# Patient Record
Sex: Male | Born: 1950
Health system: Southern US, Community
[De-identification: ages and names within clinical notes are randomized; demographics above are authoritative.]

## PROBLEM LIST (undated history)

## (undated) DIAGNOSIS — T7840XA Allergy, unspecified, initial encounter: Secondary | ICD-10-CM

## (undated) DIAGNOSIS — J449 Chronic obstructive pulmonary disease, unspecified: Secondary | ICD-10-CM

## (undated) DIAGNOSIS — I10 Essential (primary) hypertension: Secondary | ICD-10-CM

## (undated) DIAGNOSIS — H269 Unspecified cataract: Secondary | ICD-10-CM

## (undated) DIAGNOSIS — M1990 Unspecified osteoarthritis, unspecified site: Secondary | ICD-10-CM

## (undated) DIAGNOSIS — A419 Sepsis, unspecified organism: Secondary | ICD-10-CM

## (undated) DIAGNOSIS — K56609 Unspecified intestinal obstruction, unspecified as to partial versus complete obstruction: Secondary | ICD-10-CM

## (undated) DIAGNOSIS — K219 Gastro-esophageal reflux disease without esophagitis: Secondary | ICD-10-CM

## (undated) DIAGNOSIS — G8929 Other chronic pain: Secondary | ICD-10-CM

## (undated) DIAGNOSIS — M549 Dorsalgia, unspecified: Secondary | ICD-10-CM

## (undated) DIAGNOSIS — Z86010 Personal history of colonic polyps: Principal | ICD-10-CM

## (undated) DIAGNOSIS — N400 Enlarged prostate without lower urinary tract symptoms: Secondary | ICD-10-CM

## (undated) DIAGNOSIS — J45909 Unspecified asthma, uncomplicated: Secondary | ICD-10-CM

## (undated) HISTORY — PX: INSERTION DISTRACTION DEVICE LUMBAR POSTERIOR SPINOUS PROCESS: SUR726

## (undated) HISTORY — PX: APPENDECTOMY: SHX54

## (undated) HISTORY — PX: SMALL BOWEL REPAIR: SHX6447

## (undated) HISTORY — PX: BACK SURGERY: SHX140

## (undated) HISTORY — DX: Unspecified osteoarthritis, unspecified site: M19.90

## (undated) HISTORY — DX: Allergy, unspecified, initial encounter: T78.40XA

## (undated) HISTORY — PX: ANTERIOR CERVICAL DECOMP/DISCECTOMY FUSION: SHX1161

## (undated) HISTORY — PX: COLONOSCOPY: SHX174

## (undated) HISTORY — DX: Unspecified asthma, uncomplicated: J45.909

## (undated) HISTORY — DX: Sepsis, unspecified organism: A41.9

## (undated) HISTORY — DX: Personal history of colonic polyps: Z86.010

## (undated) HISTORY — PX: HERNIA REPAIR: SHX51

## (undated) HISTORY — PX: KNEE ARTHROSCOPY: SHX127

## (undated) HISTORY — DX: Unspecified intestinal obstruction, unspecified as to partial versus complete obstruction: K56.609

## (undated) HISTORY — DX: Unspecified cataract: H26.9

## (undated) HISTORY — PX: ABDOMINAL HERNIA REPAIR: SHX539

---

## 2000-11-19 ENCOUNTER — Encounter: Payer: Self-pay | Admitting: Neurological Surgery

## 2000-11-19 ENCOUNTER — Inpatient Hospital Stay (HOSPITAL_COMMUNITY): Admission: RE | Admit: 2000-11-19 | Discharge: 2000-11-20 | Payer: Self-pay | Admitting: Neurological Surgery

## 2000-12-16 ENCOUNTER — Encounter: Admission: RE | Admit: 2000-12-16 | Discharge: 2000-12-16 | Payer: Self-pay | Admitting: Neurological Surgery

## 2000-12-16 ENCOUNTER — Encounter: Payer: Self-pay | Admitting: Neurological Surgery

## 2001-02-03 ENCOUNTER — Encounter: Admission: RE | Admit: 2001-02-03 | Discharge: 2001-03-01 | Payer: Self-pay | Admitting: Family Medicine

## 2001-08-23 ENCOUNTER — Encounter: Payer: Self-pay | Admitting: Emergency Medicine

## 2001-08-23 ENCOUNTER — Emergency Department (HOSPITAL_COMMUNITY): Admission: EM | Admit: 2001-08-23 | Discharge: 2001-08-23 | Payer: Self-pay | Admitting: Emergency Medicine

## 2001-11-03 ENCOUNTER — Encounter: Payer: Self-pay | Admitting: Family Medicine

## 2001-11-03 ENCOUNTER — Encounter: Admission: RE | Admit: 2001-11-03 | Discharge: 2001-11-03 | Payer: Self-pay | Admitting: Family Medicine

## 2001-11-04 ENCOUNTER — Encounter: Payer: Self-pay | Admitting: Family Medicine

## 2001-11-04 ENCOUNTER — Ambulatory Visit (HOSPITAL_COMMUNITY): Admission: RE | Admit: 2001-11-04 | Discharge: 2001-11-04 | Payer: Self-pay | Admitting: Family Medicine

## 2001-12-08 ENCOUNTER — Inpatient Hospital Stay (HOSPITAL_COMMUNITY): Admission: EM | Admit: 2001-12-08 | Discharge: 2001-12-15 | Payer: Self-pay | Admitting: Emergency Medicine

## 2001-12-08 ENCOUNTER — Encounter: Payer: Self-pay | Admitting: Emergency Medicine

## 2001-12-09 ENCOUNTER — Encounter: Payer: Self-pay | Admitting: Surgery

## 2004-12-01 ENCOUNTER — Observation Stay (HOSPITAL_COMMUNITY): Admission: EM | Admit: 2004-12-01 | Discharge: 2004-12-02 | Payer: Self-pay

## 2005-09-17 ENCOUNTER — Encounter: Admission: RE | Admit: 2005-09-17 | Discharge: 2005-09-17 | Payer: Self-pay | Admitting: Family Medicine

## 2005-09-17 ENCOUNTER — Inpatient Hospital Stay (HOSPITAL_COMMUNITY): Admission: AD | Admit: 2005-09-17 | Discharge: 2005-09-20 | Payer: Self-pay | Admitting: Internal Medicine

## 2005-11-15 ENCOUNTER — Encounter: Admission: RE | Admit: 2005-11-15 | Discharge: 2005-11-15 | Payer: Self-pay | Admitting: Family Medicine

## 2005-12-24 ENCOUNTER — Ambulatory Visit: Payer: Self-pay | Admitting: Gastroenterology

## 2006-01-29 ENCOUNTER — Ambulatory Visit: Payer: Self-pay | Admitting: Gastroenterology

## 2006-02-05 ENCOUNTER — Ambulatory Visit: Payer: Self-pay | Admitting: Gastroenterology

## 2006-04-22 ENCOUNTER — Encounter: Admission: RE | Admit: 2006-04-22 | Discharge: 2006-04-22 | Payer: Self-pay | Admitting: Orthopedic Surgery

## 2006-05-05 ENCOUNTER — Ambulatory Visit (HOSPITAL_COMMUNITY): Admission: RE | Admit: 2006-05-05 | Discharge: 2006-05-05 | Payer: Self-pay | Admitting: Orthopedic Surgery

## 2006-06-02 ENCOUNTER — Ambulatory Visit: Payer: Self-pay | Admitting: Internal Medicine

## 2006-06-18 ENCOUNTER — Ambulatory Visit: Payer: Self-pay

## 2006-07-07 ENCOUNTER — Ambulatory Visit: Payer: Self-pay | Admitting: Internal Medicine

## 2006-07-10 ENCOUNTER — Ambulatory Visit: Payer: Self-pay | Admitting: Internal Medicine

## 2006-07-30 ENCOUNTER — Encounter: Admission: RE | Admit: 2006-07-30 | Discharge: 2006-07-30 | Payer: Self-pay | Admitting: Family Medicine

## 2006-08-31 ENCOUNTER — Ambulatory Visit: Payer: Self-pay | Admitting: Internal Medicine

## 2006-11-28 ENCOUNTER — Emergency Department (HOSPITAL_COMMUNITY): Admission: EM | Admit: 2006-11-28 | Discharge: 2006-11-28 | Payer: Self-pay | Admitting: Emergency Medicine

## 2006-12-04 ENCOUNTER — Ambulatory Visit: Payer: Self-pay | Admitting: Internal Medicine

## 2007-02-09 ENCOUNTER — Encounter: Admission: RE | Admit: 2007-02-09 | Discharge: 2007-02-09 | Payer: Self-pay | Admitting: Family Medicine

## 2007-02-16 ENCOUNTER — Ambulatory Visit (HOSPITAL_COMMUNITY): Admission: RE | Admit: 2007-02-16 | Discharge: 2007-02-16 | Payer: Self-pay | Admitting: Family Medicine

## 2007-08-10 ENCOUNTER — Encounter: Admission: RE | Admit: 2007-08-10 | Discharge: 2007-08-10 | Payer: Self-pay | Admitting: Family Medicine

## 2007-08-16 ENCOUNTER — Ambulatory Visit: Payer: Self-pay | Admitting: Internal Medicine

## 2007-12-07 ENCOUNTER — Encounter: Admission: RE | Admit: 2007-12-07 | Discharge: 2007-12-07 | Payer: Self-pay | Admitting: Orthopedic Surgery

## 2008-03-29 ENCOUNTER — Ambulatory Visit: Payer: Self-pay | Admitting: Internal Medicine

## 2008-03-29 LAB — CONVERTED CEMR LAB
AST: 30 units/L (ref 0–37)
Albumin: 4 g/dL (ref 3.5–5.2)
Alkaline Phosphatase: 76 units/L (ref 39–117)
Cholesterol: 111 mg/dL (ref 0–200)
Total CHOL/HDL Ratio: 4.8
Triglycerides: 62 mg/dL (ref 0–149)

## 2008-04-14 ENCOUNTER — Ambulatory Visit: Payer: Self-pay | Admitting: Internal Medicine

## 2008-04-25 ENCOUNTER — Ambulatory Visit: Payer: Self-pay

## 2008-04-25 LAB — CONVERTED CEMR LAB
CO2: 32 meq/L (ref 19–32)
Calcium: 9 mg/dL (ref 8.4–10.5)
Creatinine, Ser: 0.9 mg/dL (ref 0.4–1.5)
Glucose, Bld: 112 mg/dL — ABNORMAL HIGH (ref 70–99)

## 2008-04-29 ENCOUNTER — Emergency Department (HOSPITAL_COMMUNITY): Admission: EM | Admit: 2008-04-29 | Discharge: 2008-04-29 | Payer: Self-pay | Admitting: Emergency Medicine

## 2008-05-05 ENCOUNTER — Encounter: Admission: RE | Admit: 2008-05-05 | Discharge: 2008-05-05 | Payer: Self-pay | Admitting: Internal Medicine

## 2008-06-27 ENCOUNTER — Encounter: Admission: RE | Admit: 2008-06-27 | Discharge: 2008-06-27 | Payer: Self-pay | Admitting: Orthopedic Surgery

## 2008-12-22 ENCOUNTER — Ambulatory Visit: Payer: Self-pay | Admitting: Internal Medicine

## 2009-01-04 ENCOUNTER — Encounter: Payer: Self-pay | Admitting: Internal Medicine

## 2009-01-04 ENCOUNTER — Ambulatory Visit: Payer: Self-pay

## 2009-08-25 ENCOUNTER — Inpatient Hospital Stay (HOSPITAL_COMMUNITY): Admission: EM | Admit: 2009-08-25 | Discharge: 2009-08-28 | Payer: Self-pay | Admitting: Emergency Medicine

## 2009-10-17 ENCOUNTER — Encounter (INDEPENDENT_AMBULATORY_CARE_PROVIDER_SITE_OTHER): Payer: Self-pay | Admitting: *Deleted

## 2010-03-14 ENCOUNTER — Telehealth (INDEPENDENT_AMBULATORY_CARE_PROVIDER_SITE_OTHER): Payer: Self-pay | Admitting: *Deleted

## 2010-11-20 ENCOUNTER — Encounter: Payer: Self-pay | Admitting: Internal Medicine

## 2010-12-09 ENCOUNTER — Encounter: Payer: Self-pay | Admitting: Internal Medicine

## 2010-12-09 ENCOUNTER — Ambulatory Visit
Admission: RE | Admit: 2010-12-09 | Discharge: 2010-12-09 | Payer: Self-pay | Source: Home / Self Care | Attending: Internal Medicine | Admitting: Internal Medicine

## 2010-12-09 DIAGNOSIS — R079 Chest pain, unspecified: Secondary | ICD-10-CM | POA: Insufficient documentation

## 2010-12-14 ENCOUNTER — Inpatient Hospital Stay (HOSPITAL_COMMUNITY): Admission: EM | Admit: 2010-12-14 | Discharge: 2010-12-25 | Payer: Self-pay | Source: Home / Self Care

## 2010-12-16 LAB — BASIC METABOLIC PANEL
BUN: 32 mg/dL — ABNORMAL HIGH (ref 6–23)
BUN: 26 mg/dL — ABNORMAL HIGH (ref 6–23)
CO2: 26 mEq/L (ref 19–32)
CO2: 24 mEq/L (ref 19–32)
Calcium: 8 mg/dL — ABNORMAL LOW (ref 8.4–10.5)
Calcium: 8 mg/dL — ABNORMAL LOW (ref 8.4–10.5)
Chloride: 101 mEq/L (ref 96–112)
Chloride: 108 mEq/L (ref 96–112)
Creatinine, Ser: 1.74 mg/dL — ABNORMAL HIGH (ref 0.4–1.5)
Creatinine, Ser: 1.41 mg/dL (ref 0.4–1.5)
GFR calc Af Amer: 49 mL/min — ABNORMAL LOW (ref >60)
GFR calc Af Amer: >60 mL/min (ref >60)
GFR calc non Af Amer: 40 mL/min — ABNORMAL LOW (ref >60)
GFR calc non Af Amer: 51 mL/min — ABNORMAL LOW (ref >60)
Glucose, Bld: 109 mg/dL — ABNORMAL HIGH (ref 70–99)
Glucose, Bld: 101 mg/dL — ABNORMAL HIGH (ref 70–99)
Potassium: 4.4 mEq/L (ref 3.5–5.1)
Potassium: 4.6 mEq/L (ref 3.5–5.1)
Sodium: 134 mEq/L — ABNORMAL LOW (ref 135–145)
Sodium: 136 mEq/L (ref 135–145)

## 2010-12-16 LAB — COMPREHENSIVE METABOLIC PANEL
ALT: 27 U/L (ref 0–53)
AST: 26 U/L (ref 0–37)
Albumin: 3.5 g/dL (ref 3.5–5.2)
Alkaline Phosphatase: 61 U/L (ref 39–117)
BUN: 38 mg/dL — ABNORMAL HIGH (ref 6–23)
CO2: 22 mEq/L (ref 19–32)
Calcium: 8.1 mg/dL — ABNORMAL LOW (ref 8.4–10.5)
Chloride: 102 mEq/L (ref 96–112)
Creatinine, Ser: 2.18 mg/dL — ABNORMAL HIGH (ref 0.4–1.5)
GFR calc Af Amer: 38 mL/min — ABNORMAL LOW (ref >60)
GFR calc non Af Amer: 31 mL/min — ABNORMAL LOW (ref >60)
Glucose, Bld: 145 mg/dL — ABNORMAL HIGH (ref 70–99)
Potassium: 4.1 mEq/L (ref 3.5–5.1)
Sodium: 135 mEq/L (ref 135–145)
Total Bilirubin: 0.7 mg/dL (ref 0.3–1.2)
Total Protein: 7.1 g/dL (ref 6.0–8.3)

## 2010-12-16 LAB — DIFFERENTIAL
Basophils Absolute: 0 10*3/uL (ref 0.0–0.1)
Basophils Relative: 0 % (ref 0–1)
Eosinophils Absolute: 0 10*3/uL (ref 0.0–0.7)
Eosinophils Relative: 0 % (ref 0–5)
Lymphocytes Relative: 9 % — ABNORMAL LOW (ref 12–46)
Lymphs Abs: 1.1 10*3/uL (ref 0.7–4.0)
Monocytes Absolute: 1.1 10*3/uL — ABNORMAL HIGH (ref 0.1–1.0)
Monocytes Relative: 9 % (ref 3–12)
Neutro Abs: 10.3 10*3/uL — ABNORMAL HIGH (ref 1.7–7.7)
Neutrophils Relative %: 82 % — ABNORMAL HIGH (ref 43–77)

## 2010-12-16 LAB — URINALYSIS, ROUTINE W REFLEX MICROSCOPIC
Bilirubin Urine: NEGATIVE
Hgb urine dipstick: NEGATIVE
Ketones, ur: NEGATIVE mg/dL
Nitrite: NEGATIVE
Protein, ur: NEGATIVE mg/dL
Specific Gravity, Urine: 1.025 (ref 1.005–1.030)
Urine Glucose, Fasting: NEGATIVE mg/dL
Urobilinogen, UA: 1 mg/dL (ref 0.0–1.0)
pH: 5.5 (ref 5.0–8.0)

## 2010-12-16 LAB — CBC
HCT: 39.8 % (ref 39.0–52.0)
HCT: 39.3 % (ref 39.0–52.0)
HCT: 37.7 % — ABNORMAL LOW (ref 39.0–52.0)
Hemoglobin: 13.7 g/dL (ref 13.0–17.0)
Hemoglobin: 13.4 g/dL (ref 13.0–17.0)
Hemoglobin: 12.5 g/dL — ABNORMAL LOW (ref 13.0–17.0)
MCH: 31.9 pg (ref 26.0–34.0)
MCH: 32.1 pg (ref 26.0–34.0)
MCH: 31.6 pg (ref 26.0–34.0)
MCHC: 34.4 g/dL (ref 30.0–36.0)
MCHC: 34.1 g/dL (ref 30.0–36.0)
MCHC: 33.2 g/dL (ref 30.0–36.0)
MCV: 92.8 fL (ref 78.0–100.0)
MCV: 94.2 fL (ref 78.0–100.0)
MCV: 95.4 fL (ref 78.0–100.0)
Platelets: 255 10*3/uL (ref 150–400)
Platelets: 237 10*3/uL (ref 150–400)
Platelets: 250 10*3/uL (ref 150–400)
RBC: 4.29 MIL/uL (ref 4.22–5.81)
RBC: 4.17 MIL/uL — ABNORMAL LOW (ref 4.22–5.81)
RBC: 3.95 MIL/uL — ABNORMAL LOW (ref 4.22–5.81)
RDW: 13.7 % (ref 11.5–15.5)
RDW: 14.1 % (ref 11.5–15.5)
RDW: 14.1 % (ref 11.5–15.5)
WBC: 12.5 10*3/uL — ABNORMAL HIGH (ref 4.0–10.5)
WBC: 13.1 10*3/uL — ABNORMAL HIGH (ref 4.0–10.5)
WBC: 12.3 10*3/uL — ABNORMAL HIGH (ref 4.0–10.5)

## 2010-12-16 LAB — CROSSMATCH
ABO/RH(D): O POS
Antibody Screen: NEGATIVE

## 2010-12-16 LAB — ABO/RH: ABO/RH(D): O POS

## 2010-12-16 LAB — LACTIC ACID, PLASMA: Lactic Acid, Venous: 1.4 mmol/L (ref 0.5–2.2)

## 2010-12-16 LAB — MRSA PCR SCREENING: MRSA by PCR: POSITIVE — AB

## 2010-12-18 LAB — CBC
HCT: 36.6 % — ABNORMAL LOW (ref 39.0–52.0)
Hemoglobin: 12 g/dL — ABNORMAL LOW (ref 13.0–17.0)
MCH: 31.8 pg (ref 26.0–34.0)
MCHC: 32.8 g/dL (ref 30.0–36.0)
MCV: 97.1 fL (ref 78.0–100.0)
Platelets: 253 10*3/uL (ref 150–400)
RBC: 3.77 MIL/uL — ABNORMAL LOW (ref 4.22–5.81)
RDW: 14 % (ref 11.5–15.5)
WBC: 11.4 10*3/uL — ABNORMAL HIGH (ref 4.0–10.5)

## 2010-12-18 LAB — BASIC METABOLIC PANEL
BUN: 23 mg/dL (ref 6–23)
BUN: 18 mg/dL (ref 6–23)
CO2: 25 mEq/L (ref 19–32)
CO2: 24 mEq/L (ref 19–32)
Calcium: 7.8 mg/dL — ABNORMAL LOW (ref 8.4–10.5)
Calcium: 8 mg/dL — ABNORMAL LOW (ref 8.4–10.5)
Chloride: 105 mEq/L (ref 96–112)
Chloride: 106 mEq/L (ref 96–112)
Creatinine, Ser: 1.36 mg/dL (ref 0.4–1.5)
Creatinine, Ser: 1.07 mg/dL (ref 0.4–1.5)
GFR calc Af Amer: >60 mL/min (ref >60)
GFR calc Af Amer: >60 mL/min (ref >60)
GFR calc non Af Amer: 54 mL/min — ABNORMAL LOW (ref >60)
GFR calc non Af Amer: >60 mL/min (ref >60)
Glucose, Bld: 109 mg/dL — ABNORMAL HIGH (ref 70–99)
Glucose, Bld: 80 mg/dL (ref 70–99)
Potassium: 5.3 mEq/L — ABNORMAL HIGH (ref 3.5–5.1)
Potassium: 4.3 mEq/L (ref 3.5–5.1)
Sodium: 136 mEq/L (ref 135–145)
Sodium: 137 mEq/L (ref 135–145)

## 2010-12-21 ENCOUNTER — Encounter: Payer: Self-pay | Admitting: Family Medicine

## 2010-12-22 ENCOUNTER — Encounter: Payer: Self-pay | Admitting: Internal Medicine

## 2010-12-24 ENCOUNTER — Ambulatory Visit: Admit: 2010-12-24 | Payer: Self-pay

## 2010-12-24 LAB — MAGNESIUM: Magnesium: 1.8 mg/dL (ref 1.5–2.5)

## 2010-12-24 LAB — CBC
HCT: 33.7 % — ABNORMAL LOW (ref 39.0–52.0)
Hemoglobin: 11.4 g/dL — ABNORMAL LOW (ref 13.0–17.0)
MCH: 30.9 pg (ref 26.0–34.0)
MCHC: 33.6 g/dL (ref 30.0–36.0)
Platelets: 295 10*3/uL (ref 150–400)
RDW: 13.3 % (ref 11.5–15.5)
WBC: 10.3 10*3/uL (ref 4.0–10.5)

## 2010-12-24 LAB — BASIC METABOLIC PANEL
CO2: 24 mEq/L (ref 19–32)
Calcium: 8.4 mg/dL (ref 8.4–10.5)
Creatinine, Ser: 1.03 mg/dL (ref 0.4–1.5)
GFR calc Af Amer: >60 mL/min (ref >60)
GFR calc Af Amer: >60 mL/min (ref >60)
GFR calc non Af Amer: >60 mL/min (ref >60)
GFR calc non Af Amer: >60 mL/min (ref >60)
Glucose, Bld: 85 mg/dL (ref 70–99)
Potassium: 3.5 mEq/L (ref 3.5–5.1)
Sodium: 144 mEq/L (ref 135–145)
Sodium: 140 mEq/L (ref 135–145)

## 2010-12-24 LAB — URINE CULTURE

## 2010-12-24 LAB — URINALYSIS, MICROSCOPIC ONLY
Ketones, ur: NEGATIVE mg/dL
Leukocytes, UA: NEGATIVE
Nitrite: NEGATIVE
Protein, ur: NEGATIVE mg/dL
Urobilinogen, UA: 2 mg/dL — ABNORMAL HIGH (ref 0.0–1.0)

## 2010-12-24 NOTE — Discharge Summary (Signed)
Shane Shane Clark, Shane [name]:  [id number]  MEDICAL RECORD NO.:  [id number]          PATIENT TYPE:  INP  LOCATION:  5126                         FACILITY:  [facility]  PHYSICIAN:  Shane Shane Clark, MDDATE OF BIRTH:  02/19/1951  DATE OF ADMISSION:  12/14/2010 DATE OF DISCHARGE:  12/24/2010                        DISCHARGE SUMMARY - REFERRING   ADMISSION DIAGNOSES: 1. Recurrent small bowel obstruction, last obstruction August 28, 2009 with umbilical hernia repair. 2. Dehydration, acute renal failure, decreased urine output with     creatinine of 2.18. 3. Hypotension. 4. History of hypertension. 5. Chronic obstructive pulmonary disease/asthma. 6. Mild leukocytosis.  DISCHARGE DIAGNOSES: 1. Small bowel obstruction secondary to adhesions. 2. Prolonged postoperative ileus. 3. Positive methicillin-resistant Staph aureus screening. 4. History of dyslipidemia. 5. History of hypertension. 6. History of cervical disk disease with prior surgery. 7. Chronic obstructive pulmonary disease/asthma. 8. Morbid obesity with BMI of 45.2.  Weight is 138.70 kg, 69 inches.  PROCEDURES: 1. Small bowel obstruction with exploratory laparotomy and lysis of     adhesions, December 15, 2010. 2. CT of abdomen and pelvis without contrast showing mild small bowel     obstruction without hernia or mass, question of adhesions,     bibasilar atelectasis, tiny umbilical hernia containing fat.  BRIEF HISTORY:  The patient is a 61 year old gentleman with an extensive medical history.  He is followed by Dr. Vianne Clark for his primary care.  He presented with abdominal pain, distention, nausea and vomiting.  The pain started on Thursday and became progressively worse. He went to his primary care physician on Friday with complaints of dysuria, burning, along with abdominal pain.  At that time, it was diagnosed with a   urinary tract infection and was placed on Septra DS.  He returned  home but his pain persisted throughout the night.  He developed nausea, vomiting, fever, minimal urine output, and shortness of breath.  His last bowel movement was on Thursday prior to admission.  He subsequently presented to the ER for evaluation and was admitted.  HOSPITAL COURSE:  The patient was admitted to the step-down unit for monitoring of his shortness of breath, decreased urine output and hypotension.  He had a NG tube placed for decompression and aggressive IV fluid hydration was initiated.  He was maintained on his Symbicort nebulizers but his blood pressure medicines were withheld.  He was monitored in the step-down unit and ultimately taken to the operating room on December 15, 2010 by Dr. Dwain Clark and Dr. Johna Shane Clark, where he underwent exploratory laparotomy and lysis of adhesions.  He was transferred back to the Step down Unit in stable condition.  His blood pressure improved with ongoing hydration.  He had a low-grade temperature but, overall, made good progress and was transferred to 5100 on the first postoperative day.  He was started on progressive ambulation on the floor.  He had a prolonged ileus with a fair amount of NG output for the first several days.  With hydration, his creatinine came down to normal to 1.07 by December 17, 2010.  His white count also improved.  He  developed bowel sounds by the third postoperative day with a little bit of flatus but he had fair amount of NG output, so his diet was not advanced.  Ultimately over the weekend on January 20 and January 21, he was started on clear liquids and with further mobilization, started having more flatus and bowel movements.  He was advanced to full liquids and then a soft diet on the eighth postoperative day.  His IV fluids were discontinued.  His blood pressure medicine was restarted at a lower dose.  By December 24, 2010, he was ambulating well, tolerating regular diet with good bowel movements.  His IV  was discontinued.  His abdominal wound had a little bit of drainage on the dressing and we removed the staples on what is now the ninth postoperative day.  There was a little bit of separation and serous drainage from the very top of the incision. The whole incision was cleaned.  Benzoin and Steri-Strips were applied. We are ambulating the patient in the hall and if he does well, we anticipate discharging him home later this afternoon.  DISCHARGE MEDICATIONS: 1. Vicodin 1-2 tablets every 6 hours as needed. 2. He is not to take more than 2000 mg of Tylenol per day. 3. His potassium was low and we started him on potassium chloride 20     mEq one daily by p.o. at lunch.  We gave him a 47-month supply with     instructions to contact Dr. Levora Shane Clark about followup. 4. He is going to resume his Micardis HCT 80/12.5.  For the first 10     days, he is going to take 1/2 tablet b.i.d.  He is going to check     his blood pressure and if the systolic blood pressure is less than     110, he is going to contact Dr. Levora Shane Clark for instructions.  If his     blood pressure remains good after 10 days, he can resume a full     tablet once a day. 5. Continue aspirin 81 mg daily. 6. Nitroglycerin sublingual p.r.n. for pain. 7. Protonix 40 mg daily. 8. Resume his Symbicort 80/4.5 two puffs t.i.d. p.r.n..  DISCHARGE ACTIVITIES:  Light to moderate.  He is to walk daily.  He is to clean his wounds with plain soap and water.  He may shower in 48 hours and remove Steri-Strips in 7-10 days if they do not come off with normal washing first.  He has a follow-up appointment with Dr. Dwain Clark on Wednesday, February 1 at 4:30 p.m..  DISCHARGE LABS:  Urinalysis normal, 0-2 white cells per high-powered field.  Magnesium is 1.8.  BMP:  His sodium is 140, potassium of 3.1, chloride of 107, CO2 23, glucose 135, BUN of 12, creatinine 1.03.  CBC shows a white count 11.7, hemoglobin is 11.6, hematocrit is 34.5, platelets  of 295,000.  Urine culture during this hospitalization showed 20,000 colonies of multiple species and was not treated.  CONDITION ON DISCHARGE:  Improved.     Shane Shane Clark, P.A.   ______________________________ Shane Shane Clark, Shane Shane Clark    WDJ/MEDQ  D:  12/24/2010  T:  12/24/2010  Job:  [id number]  cc:   Shane Shane Clark, M.D.  Electronically Signed by Sherrie George P.A. on 12/24/2010 01:15:58 PM Electronically Signed by Emelia Loron Hakeem Frazzini on 12/24/2010 03:05:03 PM

## 2010-12-27 LAB — WOUND CULTURE

## 2010-12-31 NOTE — Progress Notes (Signed)
  Request Recieved from [facility] sent th [place]  [facility]  March 14, 2010 8:34 AM

## 2011-01-02 NOTE — Assessment & Plan Note (Addendum)
Summary: f1y/ gd   Visit Type:  Follow-up  CC:  chest pain, sob, and left arm and shoulder pain.  History of Present Illness: IDENTIFICATION:  Shane Clark is a 60 year old gentleman with a history of no known coronary artery disease, normal catheterization in 1998, Myoview in 2007, and again in 2009, no ischemia.  Last seen in January 2010 Since seen he continues to have chest pian.   Pain is frequent, can be constent.  Recently in church had stabbing pains in chest.  Took NTG with relief.  Alos notes some L arm pain that is positional Continues to complain of SOB.  Might be a little better on Symbicort.   Current Medications (verified): 1)  Pantoprazole Sodium 40 Mg Tbec (Pantoprazole Sodium) .... Take One Daily 2)  Aspir-Low 81 Mg Tbec (Aspirin) .... Take One Daily 3)  Symbicort 80-4.5 Mcg/act Aero (Budesonide-Formoterol Fumarate) .... Two Puff Twice Daily 4)  Micardis Hct 80-25 Mg Tabs (Telmisartan-Hctz) .... Take One Daily 5)  Nitrostat 0.4 Mg Subl (Nitroglycerin) .... Take As Needed  Allergies (verified): 1)  ! Percocet  Past History:  Past medical, surgical, family and social histories (including risk factors) reviewed, and no changes noted (except as noted below).  Past Medical History: Chest pain. COPD HTN  Family History: Reviewed history from 12/06/2010 and no changes required.   FAMILY HISTORY:  Noncontributory  Social History: Reviewed history from 12/06/2010 and no changes required.  He does not smoke, drink or use drugs.  Review of Systems       All systmes reivewed.  NEg to the above problem except as noted above.  Vital Signs:  Patient profile:   60 year old male Height:      69 inches Weight:      301 pounds BMI:     44.61 Pulse rate:   88 / minute Pulse rhythm:   regular BP sitting:   120 / 86  (right arm)  Vitals Entered By: Jacquelin Hawking, CMA (December 09, 2010 12:34 PM)  Physical Exam  Additional Exam:  patient a 60 year old in  NAD HEENT:  Normocephalic, atraumatic. EOMI, PERRLA.  Neck: JVP is normal. No thyromegaly. No bruits.  Lungs: clear to auscultation. No rales no wheezes.  Heart: Regular rate and rhythm. Normal S1, S2. No S3.   No significant murmurs. PMI not displaced.  Abdomen:  Supple, nontender. Normal bowel sounds. No masses. No hepatomegaly.  Extremities:   Good distal pulses throughout. No lower extremity edema.  Musculoskeletal :moving all extremities.  Neuro:   alert and oriented x3.    EKG  Procedure date:  12/09/2010  Findings:      NSR.  Nonspecific ST changes.  Impression & Recommendations:  Problem # 1:  CHEST PAIN UNSPECIFIED (ICD-786.50) Patinet has had mult evals, none recent.  I would recommend a Lexiscan myoview to r/p ischemia.  If neg a trial of Norvasc.  Continue current regimen.  Problem # 2:  HYPERTENSION, BENIGN (ICD-401.1) Adequate control.  Other Orders: EKG w/ Interpretation (93000) Nuclear Stress Test (Nuc Stress Test)  Patient Instructions: 1)  Your physician has requested that you have a lexiscan myoview.  For further information please visit [email or web address]  Please follow instruction sheet, as given.  we will call you about results and follow up   Appended Document: f1y/ gd Patient cancelled nuc stress test scheduled for 12/24/2010.

## 2011-01-02 NOTE — Letter (Signed)
Summary: [facility]   Imported By: Marylou Mccoy 12/23/2010 13:37:32  _____________________________________________________________________  External Attachment:    Type:   Image     Comment:   External Document

## 2011-01-14 ENCOUNTER — Ambulatory Visit
Admission: RE | Admit: 2011-01-14 | Discharge: 2011-01-14 | Disposition: A | Discharge status: Home / Self Care | Payer: Commercial Managed Care - PPO | Source: Ambulatory Visit | Attending: General Surgery | Admitting: General Surgery

## 2011-01-14 ENCOUNTER — Other Ambulatory Visit: Payer: Self-pay | Admitting: General Surgery

## 2011-01-14 ENCOUNTER — Inpatient Hospital Stay (HOSPITAL_COMMUNITY)
Admission: AD | Admit: 2011-01-14 | Discharge: 2011-01-16 | DRG: 392 | Disposition: A | Discharge status: Home / Self Care | Payer: 59 | Source: Ambulatory Visit | Attending: Pulmonary Disease | Admitting: Pulmonary Disease

## 2011-01-14 ENCOUNTER — Inpatient Hospital Stay (HOSPITAL_COMMUNITY): Payer: 59

## 2011-01-14 DIAGNOSIS — I10 Essential (primary) hypertension: Secondary | ICD-10-CM | POA: Diagnosis present

## 2011-01-14 DIAGNOSIS — R1030 Lower abdominal pain, unspecified: Secondary | ICD-10-CM

## 2011-01-14 DIAGNOSIS — I959 Hypotension, unspecified: Secondary | ICD-10-CM | POA: Diagnosis present

## 2011-01-14 DIAGNOSIS — J4489 Other specified chronic obstructive pulmonary disease: Secondary | ICD-10-CM | POA: Diagnosis present

## 2011-01-14 DIAGNOSIS — R1031 Right lower quadrant pain: Principal | ICD-10-CM | POA: Diagnosis present

## 2011-01-14 DIAGNOSIS — N179 Acute kidney failure, unspecified: Secondary | ICD-10-CM | POA: Diagnosis present

## 2011-01-14 DIAGNOSIS — J9819 Other pulmonary collapse: Secondary | ICD-10-CM | POA: Diagnosis present

## 2011-01-14 DIAGNOSIS — R339 Retention of urine, unspecified: Secondary | ICD-10-CM | POA: Diagnosis present

## 2011-01-14 DIAGNOSIS — K219 Gastro-esophageal reflux disease without esophagitis: Secondary | ICD-10-CM | POA: Diagnosis present

## 2011-01-14 DIAGNOSIS — J449 Chronic obstructive pulmonary disease, unspecified: Secondary | ICD-10-CM | POA: Diagnosis present

## 2011-01-14 LAB — URINALYSIS, ROUTINE W REFLEX MICROSCOPIC
Hgb urine dipstick: NEGATIVE
Ketones, ur: NEGATIVE mg/dL
Protein, ur: NEGATIVE mg/dL
Urine Glucose, Fasting: NEGATIVE mg/dL

## 2011-01-14 MED ORDER — IOHEXOL 300 MG/ML  SOLN
125.0000 mL | Freq: Once | INTRAMUSCULAR | Status: AC | PRN
  Administered 2011-01-14: 125 mL via INTRAVENOUS

## 2011-01-15 ENCOUNTER — Inpatient Hospital Stay (HOSPITAL_COMMUNITY): Payer: 59

## 2011-01-15 LAB — CBC
HCT: 34.3 % — ABNORMAL LOW (ref 39.0–52.0)
Hemoglobin: 11.6 g/dL — ABNORMAL LOW (ref 13.0–17.0)
MCH: 31 pg (ref 26.0–34.0)
MCHC: 33.8 g/dL (ref 30.0–36.0)
MCV: 91.7 fL (ref 78.0–100.0)

## 2011-01-15 LAB — BASIC METABOLIC PANEL
CO2: 24 mEq/L (ref 19–32)
Calcium: 8.7 mg/dL (ref 8.4–10.5)
Creatinine, Ser: 1.26 mg/dL (ref 0.4–1.5)
Glucose, Bld: 128 mg/dL — ABNORMAL HIGH (ref 70–99)

## 2011-01-16 LAB — BASIC METABOLIC PANEL
CO2: 25 mEq/L (ref 19–32)
GFR calc Af Amer: >60 mL/min (ref >60)
GFR calc non Af Amer: >60 mL/min (ref >60)
Glucose, Bld: 133 mg/dL — ABNORMAL HIGH (ref 70–99)
Potassium: 5 mEq/L (ref 3.5–5.1)
Sodium: 136 mEq/L (ref 135–145)

## 2011-01-16 LAB — CBC
HCT: 37 % — ABNORMAL LOW (ref 39.0–52.0)
Hemoglobin: 12.4 g/dL — ABNORMAL LOW (ref 13.0–17.0)
RDW: 13 % (ref 11.5–15.5)
WBC: 12.4 10*3/uL — ABNORMAL HIGH (ref 4.0–10.5)

## 2011-01-16 LAB — URINE CULTURE

## 2011-01-20 NOTE — Discharge Summary (Signed)
  Shane Clark, Shane [name]:  [id number]  MEDICAL RECORD NO.:  [id number]          PATIENT TYPE:  INP  LOCATION:  5126                         FACILITY:  [facility]  PHYSICIAN:  Mary Sella. Andrey Campanile, MD     DATE OF BIRTH:  04/27/1951  DATE OF ADMISSION:  12/14/2010 DATE OF DISCHARGE:  12/25/2010                              DISCHARGE SUMMARY   ADDENDUM  BRIEF HISTORY:  The patient is a 60 year old gentleman who presented with small bowel obstruction and subsequently underwent exploratory laparotomy, lysis of adhesions on December 15, 2010.  He had developed postop ileus but has subsequently made good recovery.  His staples were removed on the ninth postoperative day, December 24, 2010.  He had some drainage from very top, initially it was open and looked good with Steri- Strips but we had him up and ambulating for a while and the top portion separated approximately 2 inches in length, about 0.5 inch in depth. This was cleaned, irrigated, and packed with sterile wet-to-dry 4x4s. He was kept in the hospital overnight.  Following a.m. on December 25, 2010, the wound is clean.  There is some fibrinous material at the base but the sites are clean.  We will plan to discharge him home on wet-to- dry dressings and having followup.  He has been instructed to call if he has a temperature, drainage or generally feels bad is not sure why. His wife is a Engineer, civil (consulting) here at the hospital, she will do the wet-to-dry dressings.  We are going to get an abdominal binder to cut down on the amount of tape he will need to hold the dressing in place.  He is scheduled to come back in 2 weeks to see Dr. Dwain Sarna.  We will have him come back sooner if he has any of the above-noted problems.  Discharge meds will be as listed on the previous discharge summary.  CONDITION ON DISCHARGE:  Stable.     Eber Hong, P.A.   ______________________________ Mary Sella. Andrey Campanile, MD    WDJ/MEDQ  D:   12/25/2010  T:  12/25/2010  Job:  [id number]  cc:   Bryan Lemma. Manus Gunning, M.D.  Electronically Signed by Sherrie George P.A. on 01/02/2011 02:21:19 PM Electronically Signed by Gaynelle Adu M.D. on 01/20/2011 08:10:45 AM

## 2011-02-10 NOTE — Discharge Summary (Signed)
NAME:  Shane Clark, SONNEBORN NO.:  [id number]  MEDICAL RECORD NO.:  [id number]           PATIENT TYPE:  I  LOCATION:  5152                         FACILITY:  [facility]  PHYSICIAN:  Ardeth Sportsman, MD     DATE OF BIRTH:  [birth year]  DATE OF ADMISSION:  01/14/2011 DATE OF DISCHARGE:  01/16/2011                              DISCHARGE SUMMARY   ADMITTING PHYSICIAN:  Ardeth Sportsman, MD  DISCHARGING PHYSICIAN:  Ardeth Sportsman, MD  PRIMARY SURGEON:  Troy Sine. Dwain Sarna, MD  PRIMARY CARE PHYSICIAN:  Bryan Lemma. Ehinger, MD  REASON FOR ADMISSION:  Mr. Vandyke is a very pleasant 60 year old white male who is known to our service as he recently underwent a laparotomy with lysis of adhesions for small bowel obstruction by Dr. Dwain Sarna. The patient has been doing well from this until this past Sunday.  He went to see Dr. Dwain Sarna in our office on Tuesday with right lower quadrant abdominal pain.  This started on Sunday.  He states he was lying in bed and was trying to get up and then developed this severe right lower quadrant pain.  He denied any nausea, vomiting, diarrhea. He is continuing to pass flatus as well as have bowel movements.  He has not been running any fevers.  However, he has been having a decrease in urine output.  He went to see Dr. Dwain Sarna who set him for a CT scan. He also send him for labs which revealed a white blood cell count of 16,000 as well as an elevated creatinine at 2.0.  His CT scan at this time was normal with no acute findings except for bibasilar atelectasis. He was then sent to his primary care's office for an evaluation for urinalysis.  This was essentially negative and at this time he was admitted for further observation by the surgical service.  Please see admitting history and physical for further details.  ADMITTING DIAGNOSES: 1. Abdominal pain status post recent laparotomy with lysis of     adhesions. 2. Acute renal failure with  urinary retention. 3. History of hypertension. 4. Currently hypotensive. 5. History of asthma, 6. Chronic obstructive pulmonary disease. 7. Reflux.  HOSPITAL COURSE:  At this time the patient was admitted.  A chest x-ray was obtained which was essentially negative except for some atelectasis. Because there was no definite source for his white blood cell count, he was not placed on antibiotics.  He was however placed on IV fluids at a rate of 125 mL an hour.  Upon arrival, his admitting blood pressure was noted to be 80/50.  Throughout the night, it continued to decrease into the systolic range of 70.  At this time the patient was transferred to a step-down unit for closer monitoring.  During that time the patient had voided 230 mL but after bladder scan revealed that he still had approximately 560 mL in his bladder.  Secondary to his elevated creatinine, a Foley catheter was in place.  This is very similar to his original presenting symptoms during his first admission with his bowel obstruction as he had a worse  creatinine at that time which eventually did resolve with the Foley and fluids as well.    In step-down, the patient's blood pressure eventually bounced back up to the  systolic range of 100.  His diet was started on clears and then advanced  as tolerated.  By this second hospital day, the patient's abdominal pain was beginning to improve and he was having to take less pain medicine. He was subsequently transferred back up to a regular floor.  On hospital day #3, the patient states that his pain was almost essentially gone.  A Foley catheter remained in place.  His abdomen was otherwise soft with minimal right lower to mid central tenderness.  However, this was much improved.  He did have active bowel sounds and was otherwise nondistended.  An urine culture revealed an insignificant amount of growth.  His white blood cell count had decreased on its own to 12,000.  At this  time the patient was in good spirits and was feeling well enough for discharge home.  I did contact Urology regarding his urinary retention.  A renal ultrasound was obtained during this admission which was completely normal.  I spoke with Dr. Brunilda Payor who has felt that this was not an urgent hospital consult.  He informed me to place the patient on Flomax and to leave the Foley in place as he would be happy to see  the patient next week for further evaluation.  DISCHARGE DIAGNOSES: 1. Abdominal pain, unknown etiology. 2. Status post laparotomy with lysis of adhesions 32 days ago. 3. Urinary retention status post placement of Foley catheter. 4. Hypotension improving. 5. History of hypertension. 6. Asthma.  DISCHARGE MEDICATIONS:  The patient will remain on all of his normal home medications except for I have informed him to hold his Micardis/HCT as well as his potassium 20 mEq daily as his blood pressure is still not high enough to be able to maintain a hypertensive medication.  I have informed him he will need to follow up with Dr. Manus Gunning to evaluate this and to get an idea of when he can restart this.  Otherwise, the patient is to continue all his home medications and a new prescription for Flomax 0.4 mg p.o. daily has been written for.  DISCHARGE INSTRUCTIONS:  The patient is to continue his normal restrictions for his postsurgical time frame.  He is to resume a low- sodium heart-healthy diet.  He is to continue with his normal abdominal dressing changes and he will have a Foley in place with a leg bag and he is to empty this as needed.  He is to follow up with Dr. Dwain Sarna on February 28 for his scheduled appointment.  I will write for the patient to get a CBC the Friday prior to this to follow his white blood cell count.  Otherwise, the patient will need to follow up with Dr. Manus Gunning as well as Dr. Brunilda Payor.  This appointment with Dr. Brunilda Payor has been set up for Monday February 20 at  10:30 a.m.  The patient is to call our office for worsening pain or fever greater than 101.5.     Letha Cape, PA   ______________________________ Ardeth Sportsman, MD    KEO/MEDQ  D:  01/16/2011  T:  01/17/2011  Job:  [id number]  cc:   Lindaann Slough, M.D. Juanetta Gosling, MD Bryan Lemma. Manus Gunning, M.D.  Electronically Signed by Barnetta Chapel PA on 01/24/2011 07:06:11 AM Electronically Signed by Karie Soda MD on 02/10/2011  11:58:39 AM

## 2011-03-07 LAB — BASIC METABOLIC PANEL
BUN: 15 mg/dL (ref 6–23)
BUN: 21 mg/dL (ref 6–23)
CO2: 29 mEq/L (ref 19–32)
Chloride: 101 mEq/L (ref 96–112)
Chloride: 102 mEq/L (ref 96–112)
Chloride: 97 mEq/L (ref 96–112)
Creatinine, Ser: 1.09 mg/dL (ref 0.4–1.5)
GFR calc Af Amer: >60 mL/min (ref >60)
Glucose, Bld: 124 mg/dL — ABNORMAL HIGH (ref 70–99)
Glucose, Bld: 142 mg/dL — ABNORMAL HIGH (ref 70–99)
Potassium: 3.5 mEq/L (ref 3.5–5.1)
Potassium: 5 mEq/L (ref 3.5–5.1)

## 2011-03-07 LAB — CBC
HCT: 42.2 % (ref 39.0–52.0)
HCT: 47.7 % (ref 39.0–52.0)
Hemoglobin: 14.7 g/dL (ref 13.0–17.0)
MCHC: 34.6 g/dL (ref 30.0–36.0)
MCV: 95.1 fL (ref 78.0–100.0)
MCV: 95.2 fL (ref 78.0–100.0)
MCV: 96.2 fL (ref 78.0–100.0)
Platelets: 226 10*3/uL (ref 150–400)
Platelets: 259 10*3/uL (ref 150–400)
RBC: 5.01 MIL/uL (ref 4.22–5.81)
RDW: 13.9 % (ref 11.5–15.5)
WBC: 11.9 10*3/uL — ABNORMAL HIGH (ref 4.0–10.5)
WBC: 13.8 10*3/uL — ABNORMAL HIGH (ref 4.0–10.5)
WBC: 19.4 10*3/uL — ABNORMAL HIGH (ref 4.0–10.5)

## 2011-03-07 LAB — DIFFERENTIAL
Eosinophils Absolute: 0 10*3/uL (ref 0.0–0.7)
Eosinophils Relative: 0 % (ref 0–5)
Lymphs Abs: 0.4 10*3/uL — ABNORMAL LOW (ref 0.7–4.0)
Monocytes Relative: 5 % (ref 3–12)

## 2011-03-07 LAB — LACTIC ACID, PLASMA: Lactic Acid, Venous: 3.1 mmol/L — ABNORMAL HIGH (ref 0.5–2.2)

## 2011-04-15 NOTE — Assessment & Plan Note (Signed)
[facility]                            CARDIOLOGY OFFICE NOTE   NAME:[name], [name]                       MRN:          [id number]  DATE:04/14/2008                            DOB:          Aug 27, 1951    IDENTIFICATION:  Shane Clark is a 60 year old gentleman who I follow in  cardiology clinic.  He has no known coronary artery disease.  He had  normal catheterization in 1998.  Myoview scan was done in 2007 that  showed no ischemia.   Since seen, the patient says he has just been more fatigued.  He gets  short of breath more easily.  He has episodes of left arm, left shoulder  pain not associated with any particular activity, he is not sure what  brings them on.  He notices it more at work.   Of note, a few weeks ago he was treated for an MRSA skin infection.  He  notes multiple lesions coming up on his thighs and stomach area.  He  still has a residual lesion on his left arm and left hand.  He has  finished antibiotics now for a few weeks.   I got in touch with him and he has been on Lipitor.  We actually have a  fasting lipid panel from April 29:  Total cholesterol was 111, LDL was  much improved at 76; however, HDL has gone down to 23.   CURRENT MEDICINES:  1. Aspirin 81.  2. Protonix 40.  3. Lipitor 20.  4. Hyzaar 100/25 daily.   PHYSICAL EXAM:  The patient is in no distress.  Blood pressure 125/78,  pulse 67, weight 277.  LUNGS:  Clear.  No rales.  CARDIAC EXAM:  Regular rate and rhythm.  S1, S2.  No S3, no murmurs.  EXTREMITIES:  No edema.   IMPRESSION:  1. Shortness of breath, dyspnea, left shoulder pain.  I am not      convinced this is cardiac but he does have risk factors.  Would go      ahead and set him up for a Myoview scan.  Of note, in 1998 the      abdominal aorta noted to be minimally bulging distally.  Will set      him up for an abdominal evaluation.  2. Dyslipidemia.  With the fatigue I will go ahead and stop the  Lipitor, see if his symptoms improved.  He needs a statin, his LDL      was in the 155 range before.  Unfortunately, Lipitor brought his      HDL down; will need to reevaluate.  Note, he has been on Crestor      and Zetia, did not tolerate; would probably try Zocor.  3. Hypertension, good control.  4. Health care maintenance.  Counseled on weight.  5. Staphylococcal infection.  I told the patient if he has any new      lesions develop, of course he should contact Dr. Manus Clark as he may      need further treatment with infectious disease.   I will set follow-up  for 8 months.  Again, we will arrange the tests  noted and be in touch with him.     Shane Riffle, MD, [facility]  Electronically Signed    PVR/MedQ  DD: 04/14/2008  DT: 04/14/2008  Job #: [id number]   cc:   Shane Clark. Shane Clark, M.D.

## 2011-04-15 NOTE — Assessment & Plan Note (Signed)
[facility]                            CARDIOLOGY OFFICE NOTE   NAME:SCOTTAlassane, Kal[place]ut                       MRN:          [id number]  DATE:08/16/2007                            DOB:          09/22/1951    IDENTIFICATION:  Mr. Tsuda is a 60 year old gentleman with a history of  chest pain. He has had catheterizations in the past, last in 1998 and  also last Myoview done in July 2007 showed no ischemia.   Since seeing the patient now has a cold that he is having problems with  and bringing up creamish colored sputum.   He still gets episodes of chest pain usually without activity. No change  in this.   CURRENT MEDICATIONS:  1. Protonix 40.  2. Hyzaar 50/12.5.  3. Claritin p.r.n.  4. Ciprofloxacin b.i.d.  5. Aspirin 81 mg daily.  The patient is now off of cholesterol medications.   PHYSICAL EXAMINATION:  On examination, the patient is in no distress.  Blood pressure is 129/79. Pulse is 71, weight 275, down from 285 last  visit.  LUNGS:  Clear. No wheezes or rales.  CARDIAC: Regular rate and rhythm, S1, S2. No S3.  ABDOMEN: Obese, benign.  EXTREMITIES: No edema, 2+ pulses.   A 12 lead EKG: Normal sinus rhythm, 73 beats per minute.   IMPRESSION:  1. Chest pain, atypical. Not convinced it is ischemic or cardiac in      origin. Would continue on current regimen.  2. Hypertension, adequate control. Continue.  3. Health care maintenance. Due to see Dr.  Manus Gunning this week. Will      have lipids drawn off medicine again. Will need to followup on      this.   Otherwise, I will set followup for the spring, sooner if problems  develop.    Pricilla Riffle, MD, [facility]  Electronically Signed   PVR/MedQ  DD: 08/16/2007  DT: 08/16/2007  Job #: [id number]   cc:   Bryan Lemma. Manus Gunning, M.D.

## 2011-04-15 NOTE — Assessment & Plan Note (Signed)
[facility]                            CARDIOLOGY OFFICE NOTE   NAME:SCOTTGiovannie, Scerbo                       MRN:          [id number]  DATE:12/22/2008                            DOB:          [birth year]    IDENTIFICATION:  Mr. Kleckner is a 60 year old gentleman with a history of  no known coronary artery disease, normal catheterization in 1998,  Myoview in 2007, and again in 2009, no ischemia.  Last seen in May.   Since seen, his biggest complaint is back pain.  He is being evaluated  by a neurosurgeon, has failed many pain meds.  Still excruciating pain.  He cries today while talking about it.   He does get short of breath with activity, not sure if there is an  increase with this intermittent chest pains.   Also, finished treatment for an MRSA skin infection and he still has a  lesion under his skin on his abdomen.   CURRENT MEDICINES:  1. Protonix 40.  2. Aspirin.  3. Hyzaar 100/25.   PHYSICAL EXAMINATION:  GENERAL:  On exam, the patient is in no acute  distress at rest, though again was anxious and tearful while talking.  VITAL SIGNS:  Blood pressure is 139/92, pulse is 76, weight 306.  LUNGS:  Clear.  NECK:  No JVD.  CARDIAC:  Regular rate and rhythm, S1 and S2, no S3, no murmurs.  ABDOMEN:  Benign.  EXTREMITIES:  No edema.   IMPRESSION:  1. Dyspnea.  I am not sure what this represents.  He is in a lot of      pain.  Question if this exacerbates things, I would get an      echocardiogram to evaluate his RV filling pressures and look for      diastolic dysfunction.  Otherwise, keep him on his hypertensive      meds.  2. Hypertension.  Blood pressure a little up today.  I would follow.      He was in considerable pain.  3. Health care maintenance.  He is off Lipitor.  Dr. Manus Gunning is      following and will need a cholesterol panel off meds.   Otherwise, I will set followup for a year.  I will be in touch with him  with the test  results.   ADDENDUM:  A 12-lead EKG normal sinus rhythm, 77 beats per minute.     Pricilla Riffle, MD, [facility]  Electronically Signed    PVR/MedQ  DD: 12/24/2008  DT: 12/25/2008  Job #: [id number]   cc:   Bryan Lemma. Manus Gunning, M.D.

## 2011-04-18 NOTE — Op Note (Signed)
[place]  Patient:    [name], [name] Visit Number: [id number] MRN: [id number]          Service Type: SUR Location: 5000 5012 [id number] Attending Physician:  Shelly Rubenstein Dictated by:   Abigail Miyamoto, M.D. Proc. Date: 12/10/01 Admit Date:  12/08/2001                             Operative Report  PREOPERATIVE DIAGNOSIS:  Small bowel obstruction.  POSTOPERATIVE DIAGNOSIS:  Small bowel obstruction.  PROCEDURE:  Exploratory laparotomy.  SURGEON:  Abigail Miyamoto, M.D.  ANESTHESIA:  General endotracheal.  INDICATIONS FOR PROCEDURE:  Shane Clark is a 60 year old gentleman who has had recurrent bouts of abdominal pain for two months.  He presented most recently on 12/08/01, with abdominal pain, nausea and vomiting.  He was found on plain x-rays to have dilated small bowel consistent with a small bowel obstruction.  After NG decompression, a small-bowel follow-through was performed which showed him to have a possible stricturing at the mid small bowel related to umbilical hernia.  He did have a large amount of stool in his colon.  Given these findings, the decision was made to proceed with exploratory laparotomy.  DESCRIPTION OF PROCEDURE:  The patient was brought to the operating room, identified as Shane Clark.  He was placed supine on the operating room table and general anesthesia was induced.  His abdomen was then prepped and draped in the usual sterile fashion.  Using a #10 blade a midline incision was then created.  Incision was carried down through the fascia with the electrocautery.  The peritoneum was then opened the entire length of the incision.  The small bowel was found to be decompressed.  It was viscerated and run from the ligament of Treitz to the terminal ileum.  No evidence of stricturing or obstruction was found.  The patient did have the small fascial defect at the umbilicus.  The colon was then examined, and the  patient was found to have a large amount of hard stool in his cecum and right colon.  His liver was normal to palpation.  Given the finding of no further obstruction or compromised bowel, decision was made to close the abdomen.  The midline fascia was then closed with running #1 PDS suture.  The skin was then irrigated and closed with skin staples.  The patient tolerated the procedure well.  All sponge, needle, and instrument counts were correct at the end of the procedure.  The patient was then extubated in the operating room, and taken in stable condition to the recovery room. Dictated by:   Abigail Miyamoto, M.D. Attending Physician:  Shelly Rubenstein DD:  12/10/01 TD:  12/12/01 Job: [id number]

## 2011-04-18 NOTE — Assessment & Plan Note (Signed)
[facility]                              CARDIOLOGY OFFICE NOTE   NAME:Shane Clark, Shane Clark                       MRN:          [id number]  DATE:07/10/2006                            DOB:          [birth year]    IDENTIFICATION:  Mr. Tarnow is a 60 year old gentleman with history of chest  pain.  He is status post three cardiac catheterizations in the past.  The  last in 1998 that did not show evidence of disease.  I saw him for the first  time back in early July.  He was complaining of pain.   When I saw him, I went ahead and scheduled a myoview scan.  This was done on  July 19th and showed normal perfusion with an LVEF of 63%.   I also had the patient get lipids checked and a carotid ultrasound.  Cholesterol total was 174.  HDL was 31.  LDL was 126.  Triglycerides 82.  TSH normal.  Patient also had carotid Dopplers done, which showed mild  plaquing bilaterally.   Since seen, the patient still gives out some with activity.  He still gets  chest pain plus/minus activity.   CURRENT MEDICATIONS:  Protonix 40 q.day, lisinopril/hydrochlorothiazide  20/12.5 q.day, stool softener b.i.d., and Toprol XL 12.5 b.i.d.   PHYSICAL EXAMINATION:  Patient is in no distress.  Blood pressure:  110/68.  Pulse is 70.  Weight:  287.  LUNGS:  Clear.  CARDIAC:  Regular rate and rhythm.  S1 and S2.  No S3.  No significant  murmurs.  ABDOMEN:  Benign.  EXTREMITIES:  No edema.   IMPRESSION:  1. Chest pain.  Does not appear to be cardiac due to coronary artery      disease as perfusion data was normal.  Question if he has a spasm      component.  I have recommended he stop the Lopressor and try low dose      Cardizem 90 or 180 mg q.day.  Continue on the other medicines.  2. Dyslipidemia, mild, but he has mild plaquing noted in the carotid.  I      would begin treatment with increasing walking (he just had knee work      done and will have to just slowly increase his  activity) plus Crestor 5      mg q.day given the carotid findings.   All set to see the patient back in about eight weeks, sooner if problems  develop.                               Pricilla Riffle, MD, [facility]    PVR/MedQ  DD:  07/10/2006  DT:  07/10/2006  Job #:  [id number]

## 2011-04-18 NOTE — Discharge Summary (Signed)
[place]. [facility]  Patient:    Shane Clark, Shane Clark Visit Number: [id number] MRN: [id number]          Service Type: SUR Location: 5000 5012 01 Attending Physician:  Shelly Rubenstein Dictated by:   Abigail Miyamoto, M.D. Admit Date:  12/08/2001 Discharge Date: 12/15/2001                             Discharge Summary  SUMMARY OF HISTORY:  The patient is a 60 year old gentleman who presented with abdominal pain and nausea for 24 hours.  He was found on physical examination to have an easily reducible umbilical hernia.  Abdominal x-ray showed multiple loops of dilated small bowel and air fluid levels with a large amount of stool in the ascending colon.  Given these findings, the patient was admitted with possible small bowel obstruction and nasogastric tube was placed.  By hospital day one, the patient was starting to feel better.  He had decreased small bowel dilation.  His abdomen was softer.  At this point, an upper GI and small bowel bowel follow-through was performed which suggested a partial obstruction at the umbilicus.  Given these findings, a decision was made to proceed to the operating room.  The patient was taken to the operating room on 12/10/01 and underwent a small limited exploratory laparotomy.  He was indeed found to have a small umbilical hernia with a large amount of stool in his cecum and otherwise, a decompressed small bowel.  At this point, the patient was taken postoperatively to the regular surgical floor and enemas were instituted as well.  He did well postoperative and his NG tube was quickly removed.  Upon postoperative day four, he was having loose bowel movements and tolerating a liquid diet.  His diet was slowly again advanced.  By postoperative day five, he was ambulating well, he was tolerating a regular diet, his incision was healing well, his abdomen remained soft, and a decision was made to discharge the patient  home.  DISCHARGE DIAGNOSIS:  Small bowel obstruction.  DISCHARGE DIET:  Regular.  DISCHARGE ACTIVITY:  No heavy lifting.  DISCHARGE MEDICATIONS:  Resume his home medication.  He will take Tylox for pain.  DISCHARGE FOLLOWUP:  He will follow up in my office in one week. Dictated by:   Abigail Miyamoto, M.D. Attending Physician:  Shelly Rubenstein DD:  01/11/02 TD:  01/11/02 Job: [id number]

## 2011-04-18 NOTE — H&P (Signed)
[name]. [facility]  Patient:    Shane Clark, Shane Clark Visit Number: [id number] MRN: [id number]          Service Type: MED Location: 1800 1829 01 Attending Physician:  Hanley Seamen Dictated by:   Abigail Miyamoto, M.D. Admit Date:  12/08/2001                           History and Physical  CHIEF COMPLAINT:  Abdominal pain.  HISTORY OF PRESENT ILLNESS:  Shane Clark is a 60 year old gentleman who presents with worsening abdominal pain and nausea for 24 hours.  He has had intermittent abdominal pain for over a month and has been worked up by his primary care physician, Dr. Manus Gunning.  He has had a CAT scan of the abdomen on November 04, 2001, which was apparently unremarkable.  He reports the pain has been intermittent and began again yesterday.  He had nausea with this but no emesis.  He did move his bowels yesterday, which were normal.  He denies any chest pain, fever, or shortness of breath.  He has no dysuria.  He had no blood per rectum.  REVIEW OF SYSTEMS:  Otherwise unremarkable.  PAST MEDICAL HISTORY: 1. Peptic ulcer disease. 2. Hypertension.  PAST SURGICAL HISTORY:  This includes a C-spine surgery.  There is no history of abdominal surgery.  MEDICATIONS:  This includes an antihypertensive medication.  ALLERGIES:  CODEINE.  SOCIAL HISTORY:  He does not smoke and does not drink alcohol.  PHYSICAL EXAMINATION:  GENERAL APPEARANCE:  This is an obese gentleman in mild discomfort.  VITAL SIGNS:  Blood pressure 116/85, heart rate 84, respiratory rate 24, temperature 97.1.  HEENT:  He is anicteric.  His oropharynx is clear.  NECK:  Supple. There is a well-healed left anterior incision.  LUNGS:  Clear to auscultation bilaterally.  CARDIOVASCULAR:  Regular rate and rhythm.  ABDOMEN:  Obese and distended.  It is soft and minimally tender. There is a small umbilical hernia without evidence of incarcerated bowel. There are no inguinal hernias.   There are no palpable masses.  GENITALIA:  His genitalia appear normal.  EXTREMITIES:  Warm and well-perfused with no clubbing, cyanosis, or edema.  RECTAL:  Exam was deferred.  LABORATORY DATA:  BUN 15, creatinine 1, potassium 4.3, sodium 138, chloride 104.  Other laboratory data includes normal liver function tests.  Lipase of 19, amylase 32. CBC shows a white blood cell count of 12.3, hemoglobin 15.4, hematocrit 43.6, and platelets 238.  Abdominal x-rays show multiple loops of dilated small-bowel with some air fluid levels. There is stool in the ascending colon.  IMPRESSION:  The patient with small-bowel obstruction.  PLAN:  Admit the patient, place NG tube to decompress the bowel and begin IV rehydration. If this fails to resolve conservatively, will need to proceed with exploratory laparotomy.  As this improves, we will proceed with further work-up to determine the etiology of the obstruction. He may need a small-bowel follow-through to determine whether a polyp, diverticulum, or mass could be causing this obstruction. Dictated by:   Abigail Miyamoto, M.D. Attending Physician:  Hanley Seamen DD:  12/08/01 TD:  12/08/01 Job: [id number]

## 2011-04-18 NOTE — H&P (Signed)
NAME:  RODY, KEADLE NO.:  [id number]   MEDICAL RECORD NO.:  [id number]          PATIENT TYPE:  EMS   LOCATION:  MAJO                         FACILITY:  [facility]   PHYSICIAN:  Sandria Bales. Ezzard Standing, M.D.  DATE OF BIRTH:  [birth year]   DATE OF ADMISSION:  12/01/2004  DATE OF DISCHARGE:                                HISTORY & PHYSICAL   HISTORY OF ILLNESS:  Mr. Ruscitti is a 60 year old white male who has had a 2-3  day history of vague abdominal pain.  He started having some diarrhea he  said yesterday but then the abdomen became increasingly distended with  nausea and vomiting last night.   His wife said actually his belly was soft through the day, but he presented  to the [facility] Emergency Room because of vomiting and abdominal  discomfort.   By history, he had abdominal exploration for a small-bowel obstruction by  Dr. Carman Ching on 12/10/01.  By reading his op note, even though he had a  virgin abdomen, it was felt that he had some stricture or narrowing of the  small bowel secondary to an umbilical hernia.   He does carry the diagnosis of H. pylori which was treated with Protonix.  He had an upper endoscopy by Dr. Terrial Rhodes over 5 years ago.  He has  had no recent diagnostic testing I am aware of.  It sounds like some of his  symptoms may very well be reflux.  He has no other small bowel disease,  colon disease or liver disease.  He has never had a colonoscopy.  He had an  appendectomy as a child, at about 17 or 55, but those are his only two  abdominal operations, the appendectomy and his bowel obstruction, back 2  years ago.   ALLERGIES:  PERCOCET which led to an anaphylactic reaction.   CURRENT MEDICATIONS:  1.  Prinivil 10 mg daily.  2.  Protonix 40 mg daily.  3.  Tylenol or Advil for pain.   REVIEW OF SYSTEMS:  NEUROLOGIC:  No seizure or loss of consciousness.  PULMONARY:  Does not smoke cigarettes.  Denies pneumonia or tuberculosis.  CARDIAC:   He had a cardiac catheterization supervised by Dr. Graceann Congress  in about 1995 or 1996.  He has had no chest pain or problems since that time  and apparently his catheterization was fairly unremarkable.  He has had no  chronic cardiac followup.  He has been hypertensive, however, for several  years.  GASTROINTESTINAL:  See History of Present Illness.  UROLOGIC:  No kidney  stones or kidney infections.   SOCIAL HISTORY:  He is accompanied by his wife.  His wife works as a Pensions consultant on [employer].  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   PHYSICAL EXAMINATION:  VITAL SIGNS:  His weight is approximately 265 pounds,  height 5 feet 9 inches with a BMI of about 39.  Temperature 97.1, pulse 86,  respirations 20.   HEENT:  Unremarkable.  NECK:  Supple, without masses or thyromegaly.  LUNGS:  Clear to auscultation.  HEART:  Regular rate and rhythm, without murmur or rub.  ABDOMEN:  Moderately distended, he has active bowel sounds.  He may have a  little bit of mild soreness in his left upper quadrant but no guarding or  rebound.  His abdominal midline incision is fairly well-healed accept for  maybe a small umbilical hernia.  EXTREMITIES:  Good strength in all four extremities.  NEUROLOGIC:  Grossly intact.   LABORATORY DATA:  White blood count 10,900, hemoglobin 15, hematocrit 43.  Sodium 134, potassium 3.8, chloride 103, CO2 23, glucose 113, creatinine  0.9.  His liver functions were within normal limits, his lipase was 20.  His  KUB shows air-fluid levels and air in his colon so he either has an ileus  versus partial bowel obstruction.  I would lean towards partial bowel  obstruction, awaiting a CT scan.   [CT scan did not show small bowl obstruction.  It did show some mildly  dilated proximal small bowel.]   IMPRESSION:  1.  Probable partial bowel obstruction versus ileus.  We will review CT scan      and plan 24 hour observation. [See above.]  2.  Hypertension, hold  his medications for right now.  3.  History of gastritis, on Protonix.  4.  History of prior neck and back surgery.      Davi   DHN/MEDQ  D:  12/01/2004  T:  12/01/2004  Job:  [id number]   cc:   Jonelle Sports, MD   Ulyess Mort, M.D. [facility]

## 2011-04-18 NOTE — Discharge Summary (Signed)
NAME:  Shane Clark, Shane Clark                ACCOUNT NO.:  [id number]   MEDICAL RECORD NO.:  [id number]          PATIENT TYPE:  INP   LOCATION:  5709                         FACILITY:  [facility]   PHYSICIAN:  Corinna L. Lendell Caprice, MDDATE OF BIRTH:  01-21-1951   DATE OF ADMISSION:  09/17/2005  DATE OF DISCHARGE:  09/20/2005                                 DISCHARGE SUMMARY   DIAGNOSES:  1.  Partial small bowel obstruction.  2.  History of gastritis.  3.  Hypertension.   DISCHARGE MEDICATIONS:  He may continue Protonix and Monopril as previous.  He has been instructed to take a stool softener daily and Milk of Magnesia  30 cc orally nightly for three night. He may fill his prescription for  Phenergan 25 milligrams p.o. every 6 hours as needed for nausea, Dilaudid 2  milligrams 1-2 tablets every 4 hours as needed.   ACTIVITY:  Ad lib. He may return to work on September 24, 2005.   DIET:  As tolerated.   CONDITION:  Stable.   CONSULTATIONS:  None.   PROCEDURES:  None.   PERTINENT LABORATORY DATA:  BMET, magnesium, CBC unremarkable.   SPECIAL STUDIES IN RADIOLOGY:  Two views of the abdomen ordered by Dr.  Manus Gunning the day of admission showed a small bowel obstruction. Repeat x-ray  on the following day showed resolved bowel obstruction. There was stool  throughout the majority of the colon. CT of the abdomen and pelvis showed  probable small cyst of the hepatic dome, borderline dilatation of the left  upper quadrant jejunum loops without evidence of obstruction or wall  thickening, prostatomegaly.   HISTORY AND HOSPITAL COURSE:  Please see H&P for complete details. The  patient is a pleasant 60 year old white male patient of Dr. Manus Gunning and Dr.  Ezzard Standing who was directly admitted to the hospital with partial small bowel  obstruction. He has a history of recurrent bowel obstructions and has  undergone an exploratory laparotomy in 2003 for the same. He has had several  days of abdominal pain,  bloating and his appetite was fairly good. He was  still having bowel movements. The x-ray ordered by Dr. Manus Gunning did show  small bowel obstruction and the patient was admitted to the hospital. His  vital signs were normal on exam on admission and throughout his  hospitalization. He had some left-sided abdominal tenderness and high  pitched bowel sounds. Initially this improved. The patient was not vomiting  and NG tube was deferred. The day after admission, his x-ray findings had  resolved and the patient was started on a clear liquid diet. At the time of  discharge, he was tolerating a solid diet. He did, however, experience  recurrence of his bloating and had not had a bowel movement but continued to  have some left-sided abdominal tenderness. Therefore, a CT abdomen and  pelvis was done and results were not terribly impressive. He  does have some mild mainly epigastric tenderness but does not have much pain  or nausea. I have, however, given a prescription for Dilaudid and Phenergan  which he may fill if  needed.   His blood pressure remained stable without any medications during his  hospitalization.      Corinna L. Lendell Caprice, MD  Electronically Signed     CLS/MEDQ  D:  09/20/2005  T:  09/20/2005  Job:  [id number]   cc:   Bryan Lemma. Manus Gunning, M.D.  Fax: [phone]   Sandria Bales. Ezzard Standing, M.D.  [address]  [place]  [place] [address]

## 2011-04-18 NOTE — Letter (Signed)
November 10, 2007    Bryan Lemma. Manus Gunning, M.D.  [address] [place], [place] [address]   RE:  Clark, Shane  MRN:  [id number]  /  DOB:  12-Jun-1951   Dear Dr. Manus Gunning:   I received the lab results on Shane Clark (05/15/1951).  I have  seen him in clinic a few times.  On review of his lipids and his LDL at  155, I think this is too high.  He already has mild plaque and carotid  ultrasound.  With this and his family history and his other medical  problems, I would recommend continuing to try statins to try and prevent  disease progression.  His cholesterols in the past have been better, but  I am concerned that conservative therapy may extend for awhile and he  may have continued disease progression.  In review of his initial visit  back in 2007/07/15his father died of an MI at age 57.   I am set to see him due to see him back in the spring.  I will make sure  he has a fasting lipid panel on this visit.    Sincerely,      Pricilla Riffle, MD, [facility]  Electronically Signed    PVR/MedQ  DD: 11/10/2007  DT: 11/11/2007  Job #: [id number]

## 2011-04-18 NOTE — Op Note (Signed)
[facility]  Patient:    Shane Clark, Shane Clark                       MRN: [id number] Proc. Date: 11/19/00 Adm. Date:  [id number] Attending:  Jonne Ply                           Operative Report  PREOPERATIVE DIAGNOSIS:  Cervical spondylosis with myelopathy and radiculopathy, C4-5, C5-6, and C6-7.  POSTOPERATIVE DIAGNOSIS:  Cervical spondylosis with myelopathy and radiculopathy, C4-5, C5-6, and C6-7.  PROCEDURE:  Anterior cervical diskectomy and arthrodesis, C4-5, C5-6, and C6-7, structural allograft, and Synthes fixation.  SURGEON:  Stefani Dama, M.D.  FIRST ASSISTANT:  Hewitt Shorts, M.D.  ANESTHESIA:  General endotracheal.  CLINICAL NOTE:  The patient is a 60 year old individual who has had significant neck, shoulder, and bilateral arm pain in the C5 distribution.  He has had dysesthesias into the upper extremities and has an MRI that demonstrates disk degeneration at C4-5 with biforaminal stenosis, central disk degeneration at C5-6 and again at C6-7, causing cord compression and some myelopathic changes.  The patient was advised regarding surgical intervention, to include a three-level diskectomy and arthrodesis.  DESCRIPTION OF PROCEDURE:  The patient was brought to the operating room and placed on the table in supine position.  After the smooth induction of general endotracheal anesthesia, he had his neck shaved, prepped with Duraprep, and draped in a sterile fashion.  A transverse incision was made in the left side of the midportion of the neck and carried down through the platysma.  The plan between the sternocleidomastoid and the strap muscles was dissected bluntly until the prevertebral space was reached.  The first identifiable disk space was noted to be that of C4-5 on the radiograph.  A diskectomy at C4-5 was then undertaken after stripping the longus colli muscle so that a Caspar retractor could be placed into the  wound.  The disk space was opened with a #15 blade and then a combination of curettes and rongeurs was used to remove a modestly degenerated disk.  When the posterior longitudinal ligament was reached on the on the right side particularly, there was noted to be herniation in the region of the foramen itself.  This was retracted.  Then the posterior longitudinal ligament was cleared from side to side, and uncinate process spurs that were small at the time were ground down with an Anspach and a 2.3 mm match-stick bur.  Once the disk space was completely decompressed, a cylindrical 4 mm bit was used to shape the disk space, and then an 8 mm round fibular graft was packed with some of the osteophytic bone graft, and this was placed into the interspace.  Attention was turned to C5-6, where a similar procedure was carried out.  Here a broad central disk herniation was found posteriorly. This was cleared and decompressed, and a 7 mm round fibular graft was packed with bone spurs and packed into the interspace.  At C6-7, there was noted to be again a very prominent central disk herniation in the subligamentous space that was cleared.  The ligament was opened from side to side.  Venous bleeding from some epidural veins was encountered, and this was controlled with some bipolar cautery and then pledgets of Gelfoam soaked in thrombin, which were later removed.  In the end, the bilateral nerve roots were cleared and decompressed.  An 8 mm round fibular graft was placed into the _____ space. Then traction was removed.  The patients neck was placed in slight flexion, and a 60 mm standard Synthes plate was affixed with eight locking 4 x 16 mm screws.  Locking screws were applied.  One of the screws in the superior aspect of the L4 hole was screwed down such that a locking screw could not be inserted.  This screw was removed, and a new screw was inserted with a locking screw being placed.  The soft tissues  were then checked for hemostasis, and the platysma was closed with 3-0 Vicryl in interrupted fashion, and 3-0 Vicryl was used in the subcuticular skin.  A Tegaderm dressing was applied to the skin.  The patient tolerated the procedure well, was returned to recovery room in stable condition. DD:  11/19/00 TD:  11/20/00 Job: [id number]

## 2011-04-18 NOTE — Op Note (Signed)
NAME:  Shane, Clark                ACCOUNT NO.:  [id number]   MEDICAL RECORD NO.:  [id number]          PATIENT TYPE:  AMB   LOCATION:  SDS                          FACILITY:  [facility]   PHYSICIAN:  Burnard Bunting, M.D.    DATE OF BIRTH:  01-06-1951   DATE OF PROCEDURE:  05/05/2006  DATE OF DISCHARGE:                                 OPERATIVE REPORT   PREOPERATIVE DIAGNOSIS:  Left knee medial meniscal tear.   POSTOPERATIVE DIAGNOSIS:  Left knee medial meniscal tear.   PROCEDURE:  Left knee diagnostic and operative arthroscopy, posterior medial  meniscectomy.   SURGEON:  Burnard Bunting, M.D.   ANESTHESIA:  General endotracheal.   ESTIMATED BLOOD LOSS:  2 cc.   OPERATIVE FINDINGS:  1.  Examination under anesthesia, range of motion was 0-130 with stability.      Varus and valgus stress was 0-30 degrees.  2.  Diagnostic operative arthroscopy:  Intact patellofemoral compartment.      Intact lateral compartment.  Intact ACL and PCL.  Grade 2 chondromalacia      of over 70% of the weightbearing surfaces of the medial femoral condyle      with no corresponding tibial changes and a tear of the posterior horn      medial meniscus involving 70% of the anterior posterior width of the      meniscus.   INDICATIONS:  Shane Clark is a 59 year old patient with left knee pain  refractory to nonoperative management with positive meniscal tear on MRI  scan.   PROCEDURE IN DETAIL:  Patient was brought to the operating room where  general endotracheal anesthesia was induced.  Preoperative antibiotics were  administered.  The left leg was prepped, including the foot, with DuraPrep  solution and draped in a sterile manner.  The __________  anatomy of the  knee was identified, including the mediolateral parts of the patella as well  as the patellar tendon.  An inferior and lateral portals were established.  The anterior inferior and medial portal was then established under spinal  needle localization.   Diagnostic arthroscopy was performed.  The  patellofemoral compartment was intact.  Medial and lateral gutters were free  of loose bodies.  The lateral compartment was intact, intact for articular  cartilage in the meniscus.  The medial compartment demonstrated a grade 2  chondromalacia over the medial femoral condyle with a tear of the posterior  horn medial meniscus with unstable flaps.  This flap is debrided back to a  stable rim using a combination of a basket, punch, and  shaver.  Following a meniscal resection, the knee was thoroughly irrigated.  Instruments were removed.  Portals were closed using 3-0 nylon sutures.  A  solution with Marcaine, morphine, and clonidine was injected into the knee.  A bulky dressing was applied.  He tolerated the procedure well without any  complications.           ______________________________  G. Dorene Grebe, M.D.     GSD/MEDQ  D:  05/05/2006  T:  05/06/2006  Job:  [id number]

## 2011-04-18 NOTE — Assessment & Plan Note (Signed)
[facility]                              CARDIOLOGY OFFICE NOTE   NAME:Shane Clark, Shane Clark                       MRN:          [id number]  DATE:08/31/2006                            DOB:          [birth year]    IDENTIFICATION:  Shane Clark is a 60 year old gentleman with a history of  chest pain.  Has had several cardiac catheterizations in the past, the last  in 1998, without evidence of CAD.  Had a Myoview scan done in July that  showed normal perfusion.  I saw him back in August.   When I saw him I switched him from beta blocker to calcium channel blocker,  Cardizem 180 q. day.   He is taking the 180 mg q. day and notes some dizziness with standing, but  says his chest pain is much better.  He is getting it rarely.  Much  different.   Yesterday he did have a bad day of dizziness, the worst he has had.  Otherwise, today feeling better.  Denies syncope.   REVIEW OF SYSTEMS:  Slight nausea at times.   CURRENT MEDICATIONS:  1. Lisinopril hydrochlorothiazide 20/12.5 q. day.  2. Protonix 40 q. day.  3. Stool softener b.i.d.  4. Crestor 5 q. day.  Ran out last week.  5. Cardizem 180 q. day.  6. Aspirin 81 mg q. day.   PHYSICAL EXAM:  The patient is in no distress.  Blood pressure lying 114/70, pulse 63; sitting 124/77, pulse 68; standing at  0 minutes 124/74, pulse 66; at 2 minutes with some blurry vision 141/75,  pulse 69; at 5 minutes 138/83, pulse 70.  LUNGS:  Clear.  CARDIAC:  Regular rate and rhythm.  S1, S2, no S3, no murmurs.  ABDOMEN:  Benign.  EXTREMITIES:  No edema.   IMPRESSION:  1. Chest pain.  I doubt it is cardiac ischemia but question vasospasm,      question gastrointestinal with spasm.  He has a history of reflux on      acid inhibitor.  I would keep him on the Cardizem.  He can try backing      down to 90 q. day.  His heart rate is a little slower today though not      severely.  The other thing is to trial of Norvasc instead to  see if the      dizziness will leave.  I have given a prescription.  If he has to      switch then he can call and we will plan followup.  2. Dyslipidemia.  Rewritten a prescription for Crestor and will need      lipids with LFTs in 8 weeks' time.   I will setup to see him later in the winter, sooner if problems develop.            ______________________________  Pricilla Riffle, MD, [facility]     PVR/MedQ  DD:  08/31/2006  DT:  09/01/2006  Job #:  [id number]   cc:   Bryan Lemma. Manus Gunning, M.D.

## 2011-04-18 NOTE — Assessment & Plan Note (Signed)
[facility]                            CARDIOLOGY OFFICE NOTE   NAME:SCOTTAdryel, Wortmann                       MRN:          [id number]  DATE:12/04/2006                            DOB:          05-15-1951    IDENTIFICATION:  [name] is a 60 year old gentleman with a history of  chest pain.  He has had several cardiac catheterizations with no  evidence of CAD, last in 1998.  Myoview scan in July showed normal  perfusion.  I saw him back in October.   In the interval, from a chest pain standpoint he has done fairly well.   Note:  He was seen by his local physician, Dr. Manus Gunning, and did not  tolerate the Statin so was taken off this and placed on Zetia.  With the  Zetia, he developed hives, itching.  He says he has actually been short  of breath.  This was stopped.  He has been on antihistamine.   Again, denies significant chest pain.   CURRENT MEDICATIONS:  1. Lisinopril HCTZ 20/12.5 1 daily.  2. Protonix 40 daily.  3. Stool softener b.i.d.  4. Cardizem 90 daily.  5. Aspirin 81 mg daily.  6. Ciprofloxacin 500 b.i.d.  7. Claritin daily.   ALLERGIES:  CRESTOR leading to achiness, ZETIA leading to itching hives.   REVIEW OF SYSTEMS:  The patient seen recently in Urgent Care for UTI  treated with Cipro.   PHYSICAL EXAMINATION:  On exam, the patient is in no distress.  Blood  pressure is 129/88, pulse is 81, weight 285.  NECK:  JVP is normal.  LUNGS:  Mild wheezes with forced expiration.  ABDOMEN:  Benign.  EXTREMITIES:  No edema.   IMPRESSION:  1. Chest pain.  Clinically seems to be fairly stable.  Will follow.      Continue current regimen.  2. Wheezing.  I have given a prescription for Asmanex.  He has      Proventil to take as needed.  3. Follow up with Dr. Manus Gunning.   I will set followup for Fall, sooner if problems develop.   ADDENDUM:  Dyslipidemia.  I have counseled him on weight loss.  He says  his wife is a Engineer, civil (consulting) and they can go  over things, also on using  Promise  margarine 2 tablespoons daily to decrease his LDL.     Pricilla Riffle, MD, [facility]  Electronically Signed    PVR/MedQ  DD: 12/04/2006  DT: 12/04/2006  Job #: [id number]   cc:   Bryan Lemma. Manus Gunning, M.D.

## 2011-04-18 NOTE — H&P (Signed)
NAME:  Shane, Clark                ACCOUNT NO.:  [id number]   MEDICAL RECORD NO.:  [id number]          PATIENT TYPE:  INP   LOCATION:  5709                         FACILITY:  [facility]   PHYSICIAN:  Corinna L. Lendell Caprice, MDDATE OF BIRTH:  01/13/1951   DATE OF ADMISSION:  09/17/2005  DATE OF DISCHARGE:                                HISTORY & PHYSICAL   CHIEF COMPLAINT:  Pain.   HISTORY OF PRESENT ILLNESS:  [name] is a pleasant 60 year old white male  with a history of recurrent small bowel obstruction who is a direct  admission from Dr. Randel Books office. He has had abdominal pain namely in the  left lower quadrant for the past several days. His appetite was poor today  but he is taking liquids.  He has been having bowel movements.  He is  feeling bloated similar to is previous episodes of bowel obstruction.  He  has a history of appendectomy as a child and subsequently exploratory  laparotomy for bowel obstruction two years ago.  He has had no fever or  chills. He has not vomited but has been nauseated.  He had an acute  abdominal series today ordered by Dr. Manus Gunning which showed gaseous  distention of small bowel with stool in the colon and a few scattered air  fluid levels.   PAST MEDICAL HISTORY:  1.  As above.  2.  Hypertension.  3.  Gastritis with a history of treated H pylori.   MEDICATIONS:  1.  Protonix 40 mg a day.  2.  Monopril 10 mg a day.   SOCIAL HISTORY:  He does not smoke, drink or use drugs.   FAMILY HISTORY:  Noncontributory.   REVIEW OF SYSTEMS:  As above; otherwise negative.   PHYSICAL EXAMINATION:  VITAL SIGNS:  Temperature 97.1, pulse 75, respiratory  rate 18, blood pressure 116/73, 92% oxygenation on room air.  GENERAL:  The patient is well-nourished, well-developed, in no acute  distress.  HEENT:  Normocephalic, atraumatic.  Pupils are equal, round and reactive to  light.  Sclerae nonicteric.  Moist mucous membranes.  NECK:  Supple.  No  lymphadenopathy.  LUNGS:  Clear to auscultation bilaterally without wheezes, rhonchi or rales.  CARDIOVASCULAR:  Regular rate and rhythm without murmurs, gallops or rubs.  ABDOMEN:  Hyperactive bowel sounds, slightly distended, soft.  Some  tenderness in the lower abdomen.  No rebound tenderness.  GU/RECTAL:  Deferred.  EXTREMITIES:  No clubbing, cyanosis or edema.  SKIN:  No rash.  PSYCHIATRIC:  Normal affect.  NEUROLOGIC:  Alert and oriented.  Cranial nerves and sensory motor exam are  intact.   LABORATORY DATA:  Laboratory data done in Dr. Randel Books office shows normal  liver function tests, a CBC which is normal except for white blood cell  count of 11.5.  Acute abdominal series as above.   ASSESSMENT/PLAN:  1.  Partial small bowel obstruction.  The patient is not vomiting but if he      starts vomiting or his obstruction does not improve, he will need an NG      tube to suction.  I will make him nothing by mouth, give IV fluids,      antiemetics, pain medications as needed and check another abdominal x-      ray tomorrow.  2.  History of gastritis.  I will continue Protonix IV.  3.  Hypertension.  I will hold his monitor for now.      Corinna L. Lendell Caprice, MD  Electronically Signed     CLS/MEDQ  D:  09/18/2005  T:  09/18/2005  Job:  [id number]   cc:   Bryan Lemma. Manus Gunning, M.D.  Fax: [phone]   Sandria Bales. Ezzard Standing, M.D.  [address]  [place]  [place] [address]

## 2011-08-24 IMAGING — CT CT ABD-PELV W/ CM
1 of 5 series · 14 of 36 positions shown, 19 images · IV contrast (30CC OMNI 300 & [ID] OMNI 300)
Comparison: 12/14/2010.

CLINICAL DATA: Severe right lower quadrant abdominal pain for the
past 3 days.  Previous small bowel resection for small bowel
obstruction on 12/15/2010.  Previous appendectomy.  Three previous
lumbar spine surgeries.

CT ABDOMEN AND PELVIS WITH CONTRAST
TECHNIQUE: Multidetector CT imaging of the abdomen and pelvis was
performed following the standard protocol during bolus
administration of intravenous contrast.
Contrast: 125 ml 0mnipaque-DLL

[Series 2: a&pw/ · axial · 0.90mm/px · z∈[-456,+30]mm · 14 of 109 slices shown, 19 images]
[im 6/109  soft-tissue]
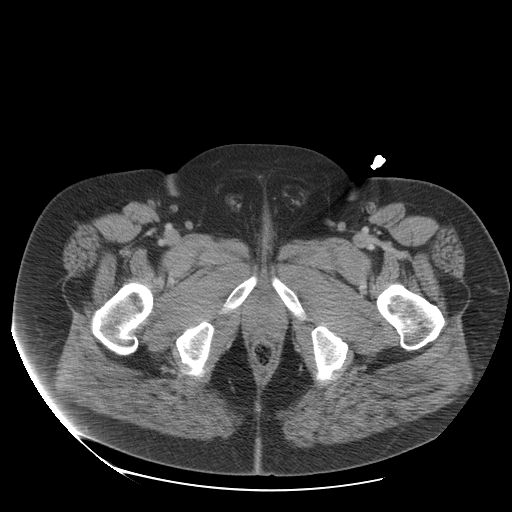
[im 6/109  bone]
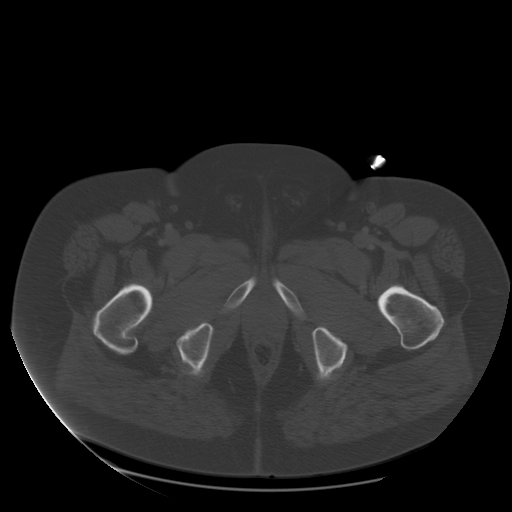
[im 18/109  soft-tissue]
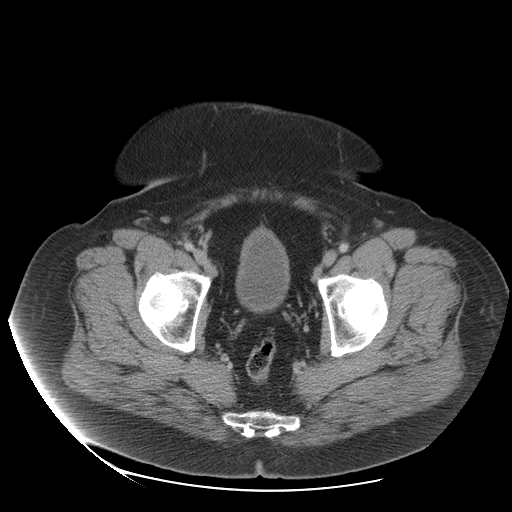
[im 23/109  soft-tissue]
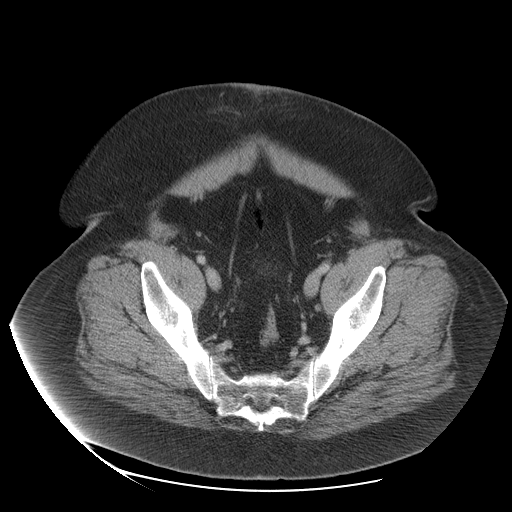
[im 29/109  soft-tissue]
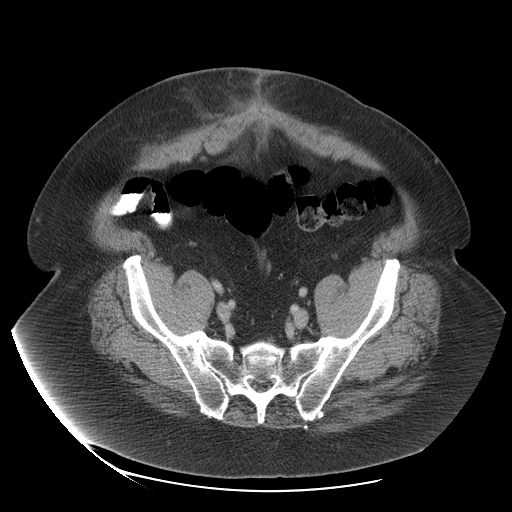
[im 40/109  soft-tissue]
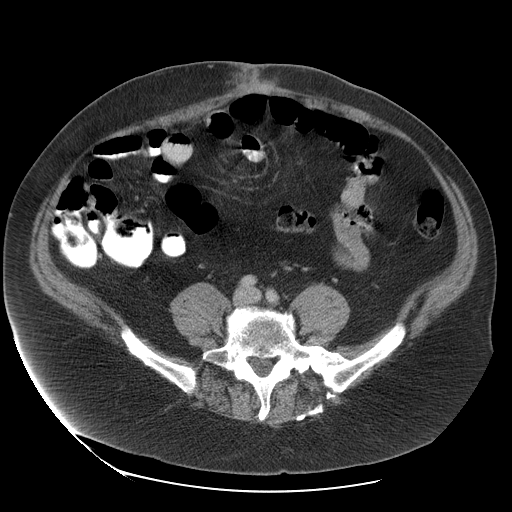
[im 46/109  soft-tissue]
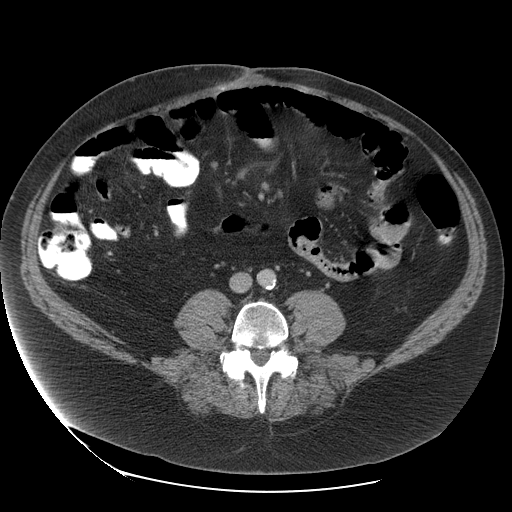
[im 57/109  soft-tissue]
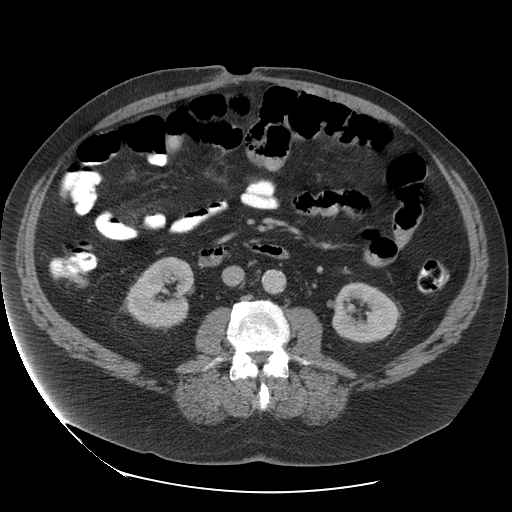
[im 63/109  soft-tissue]
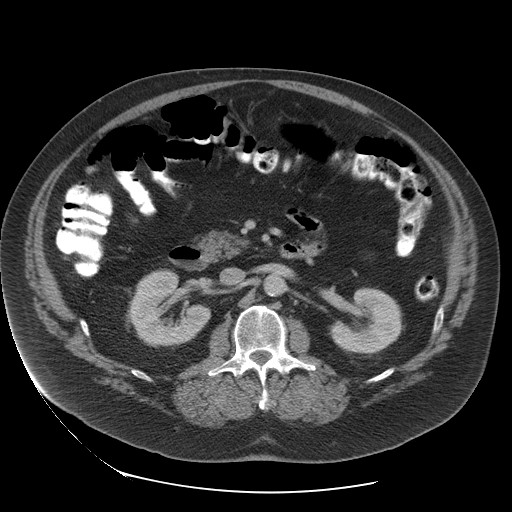
[im 69/109  soft-tissue]
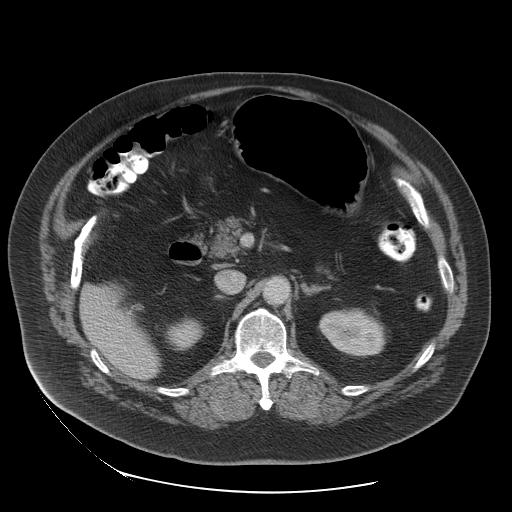
[im 69/109  bone]
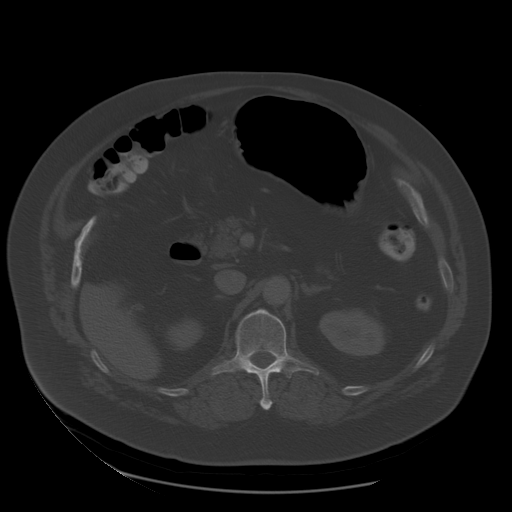
[im 80/109  soft-tissue]
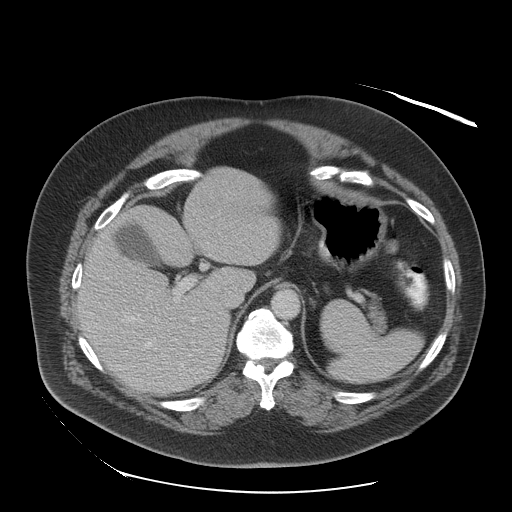
[im 86/109  soft-tissue]
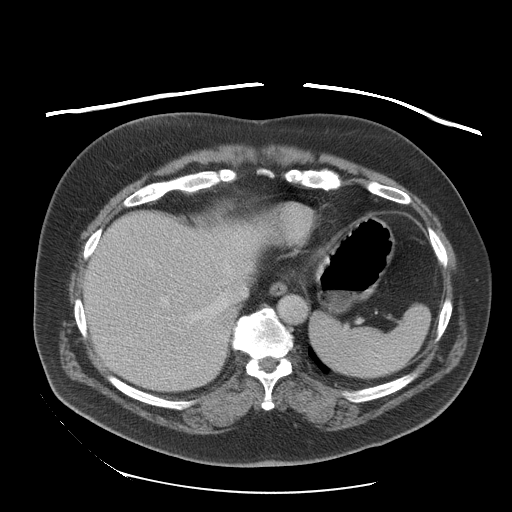
[im 86/109  lung]
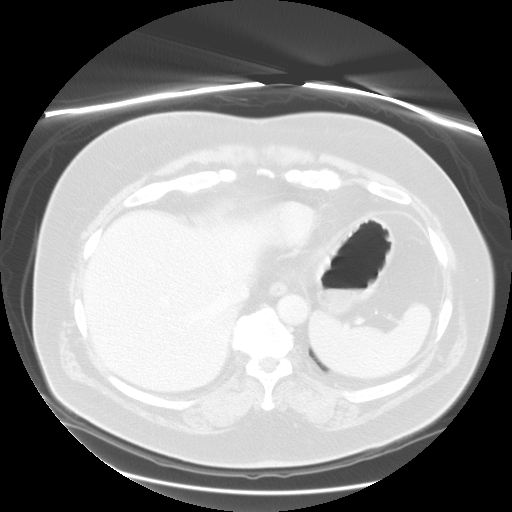
[im 91/109  soft-tissue]
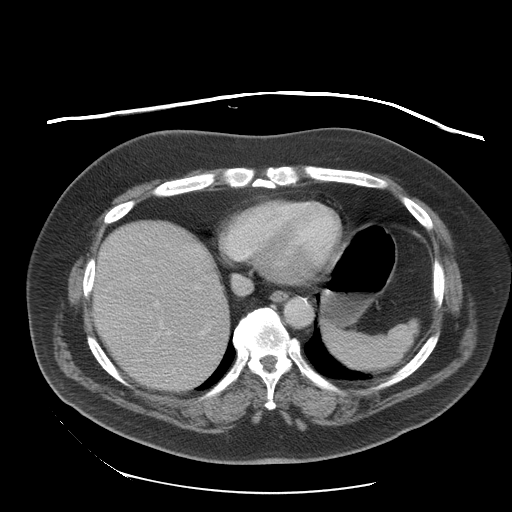
[im 91/109  lung]
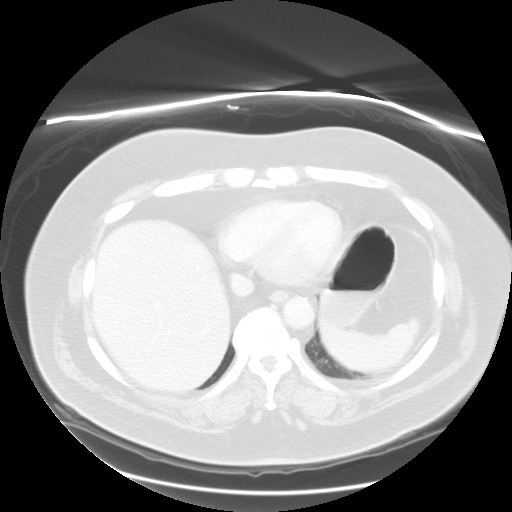
[im 97/109  lung]
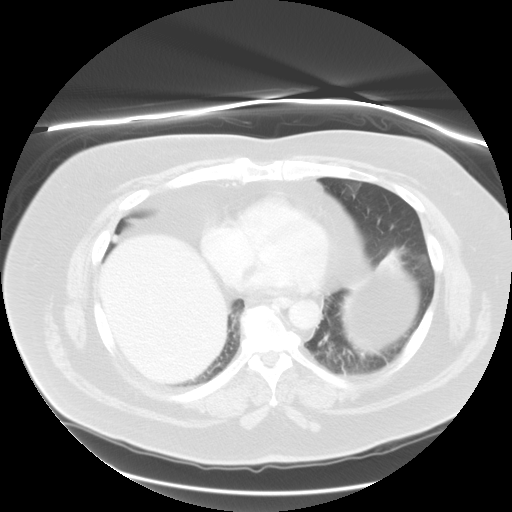
[im 103/109  soft-tissue]
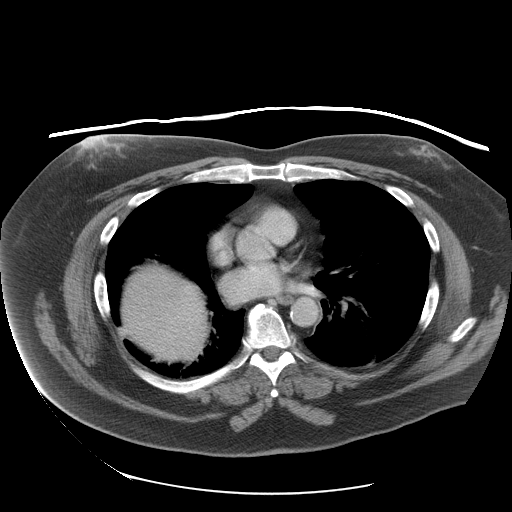
[im 103/109  lung]
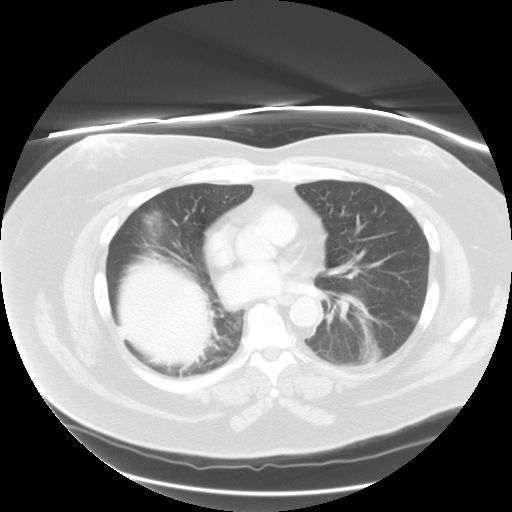

[14 of 36 positions shown; findings below may reference images not displayed]

FINDINGS: Linear densities at both lung bases.  Small left inguinal
hernia containing fat.  No dilated bowel loops.  Tiny right lobe
liver cyst.  Unremarkable spleen, pancreas, gallbladder, adrenal
glands, kidneys and urinary bladder.  Mildly enlarged prostate
gland with central calcification.  No enlarged lymph nodes.  Lumbar
and lower thoracic spine degenerative changes.
IMPRESSION: 1.  Bibasilar linear atelectasis or scarring.
2.  No acute abdominal abnormality.

## 2011-08-25 IMAGING — CR DG ABDOMEN 2V
3 series · 3 of 3 positions shown · non-contrast
Comparison: CT 01/14/2011

CLINICAL DATA: Abdominal pain, 1 month postop.

ABDOMEN - 2 VIEW

[w abdomen decub *]
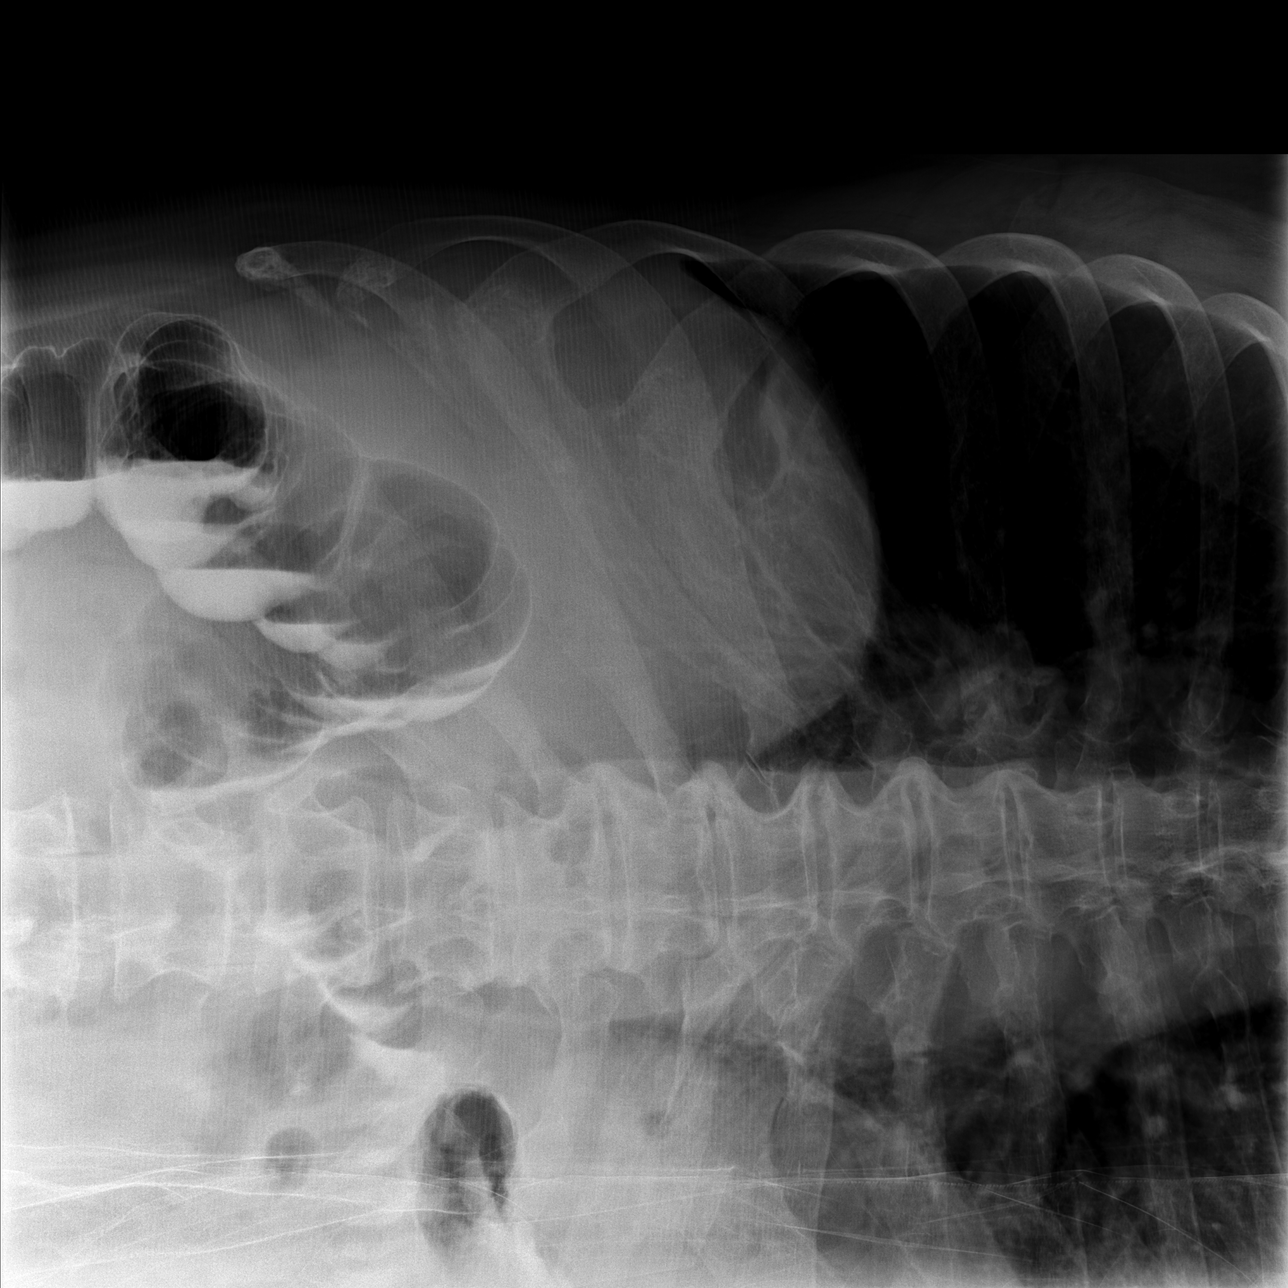

[t abdomen supine (1 of 2)]
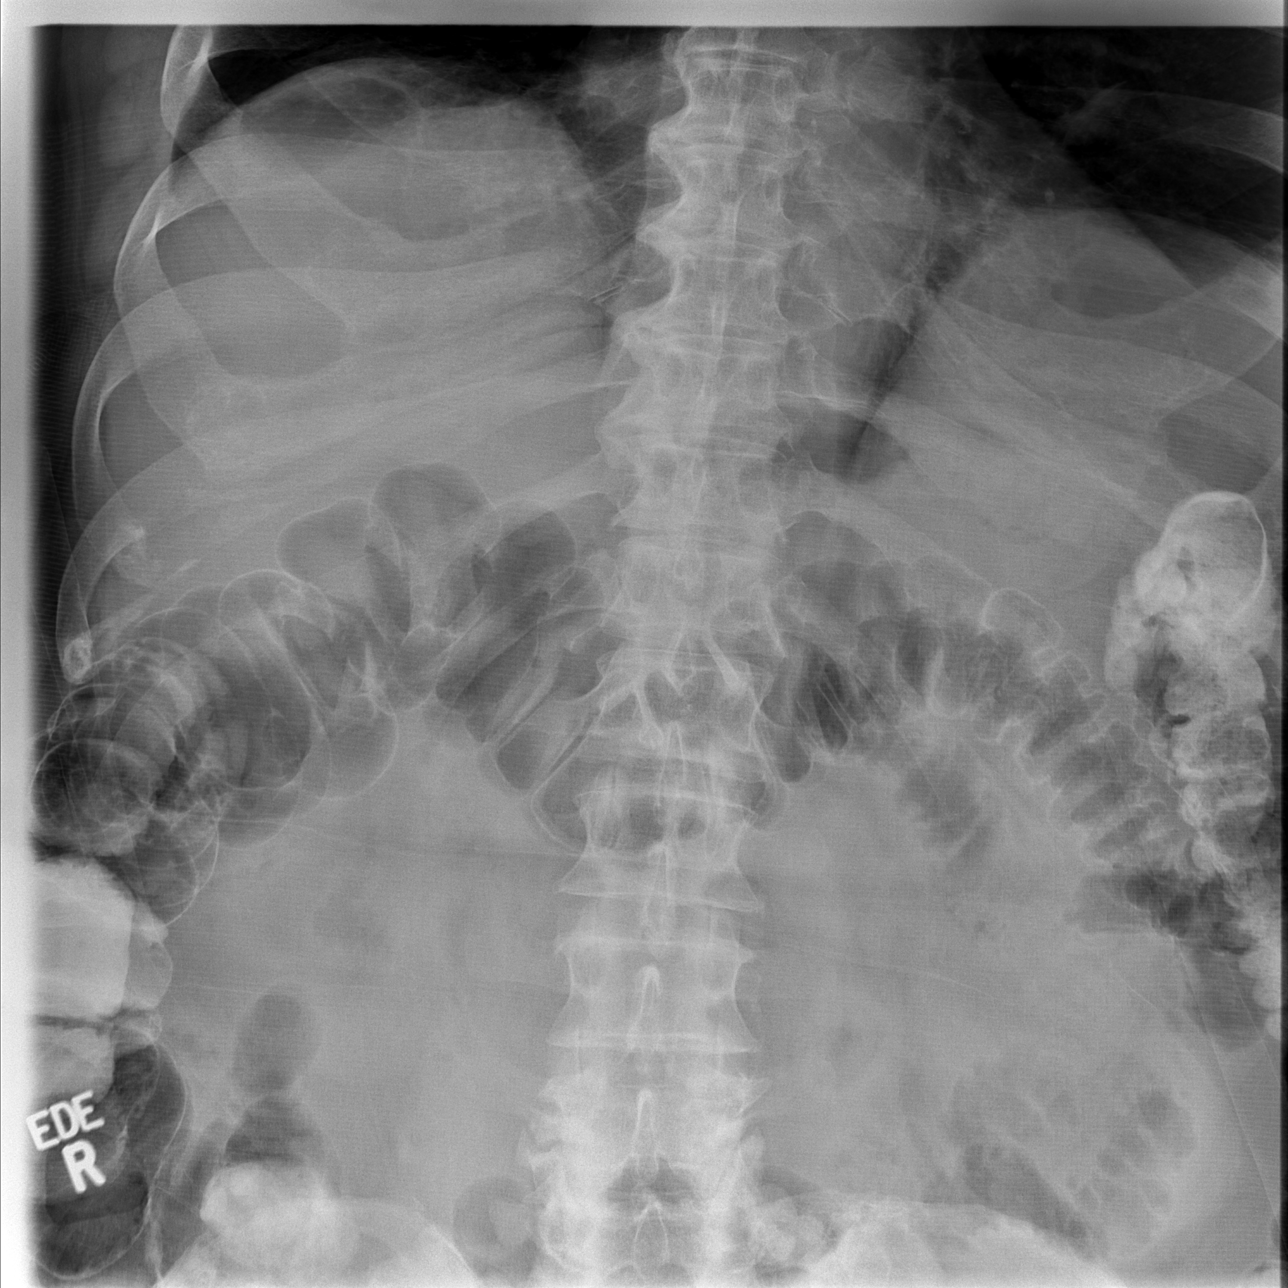

[t abdomen supine (2 of 2)]
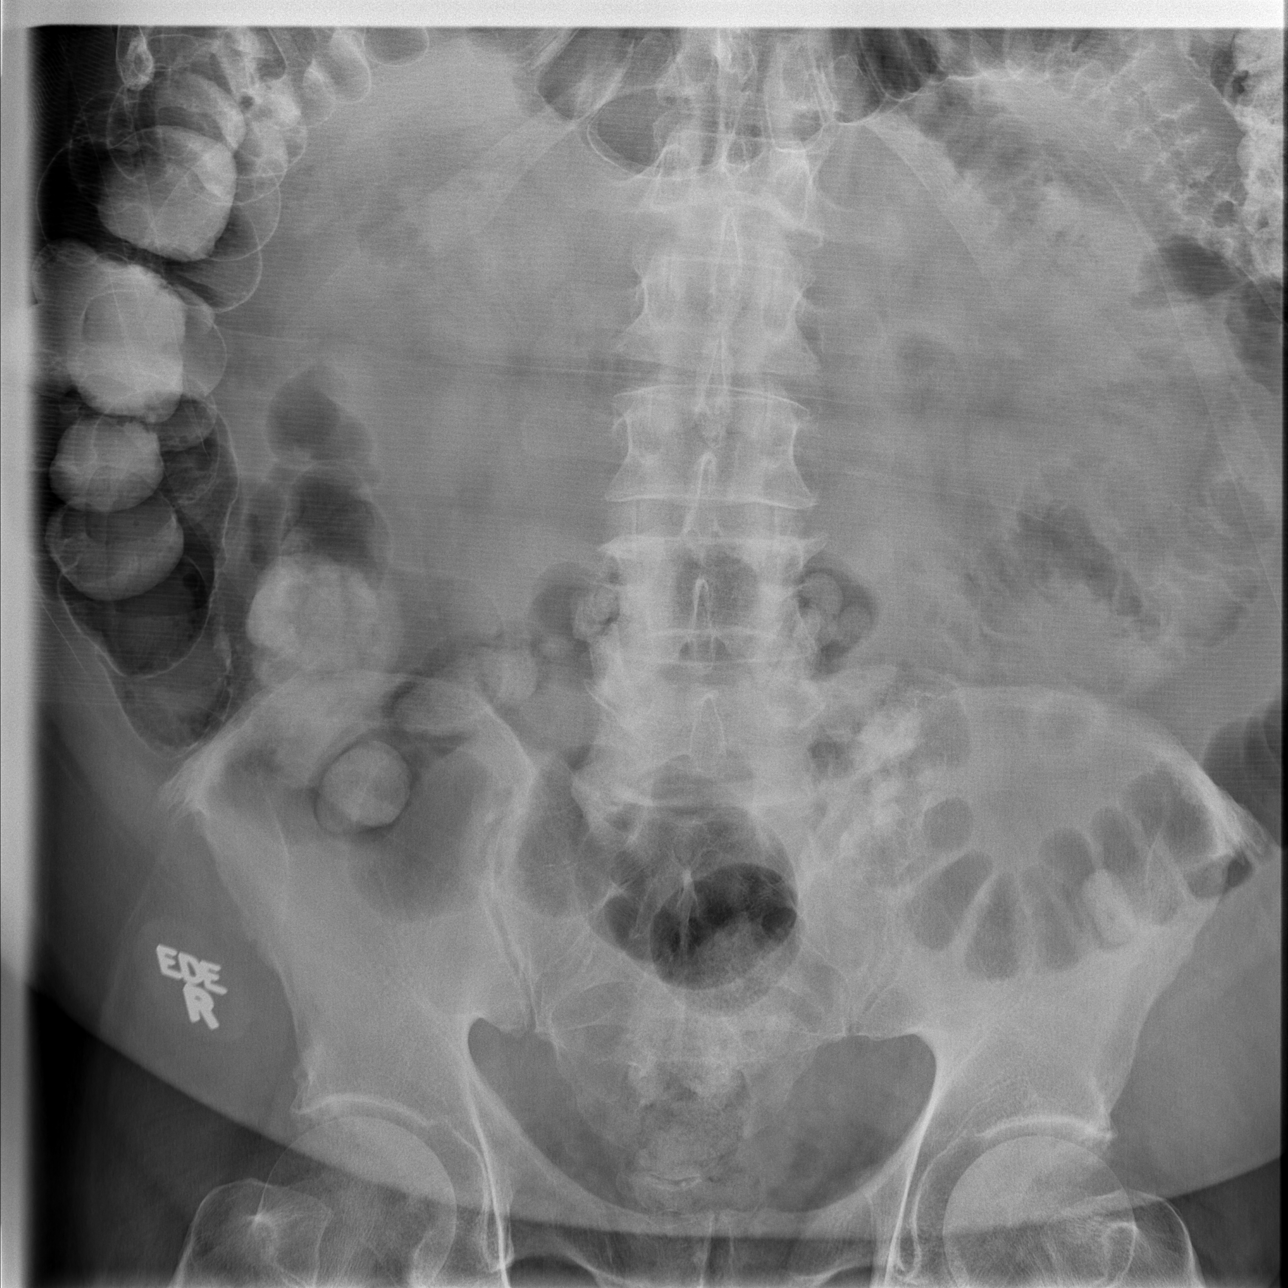

[3 of 3 positions shown; findings below may reference images not displayed]

FINDINGS: Oral contrast material remains present in the colon.
There is mild gaseous distention of the colon.  No small bowel
distention to suggest obstruction.  Bowel gas pattern suggest mild
ileus.  No organomegaly or suspicious calcifications.  Degenerative
changes in the lower lumbar spine.  No acute findings.
IMPRESSION: Mild gaseous distention of the colon, question mild ileus.  No
evidence of obstruction.

## 2011-11-20 ENCOUNTER — Emergency Department (HOSPITAL_COMMUNITY): Payer: 59

## 2011-11-20 ENCOUNTER — Other Ambulatory Visit: Payer: Self-pay

## 2011-11-20 ENCOUNTER — Inpatient Hospital Stay (HOSPITAL_COMMUNITY)
Admission: EM | Admit: 2011-11-20 | Discharge: 2011-11-22 | DRG: 315 | Disposition: A | Discharge status: Home / Self Care | Payer: 59 | Source: Ambulatory Visit | Attending: Internal Medicine | Admitting: Internal Medicine

## 2011-11-20 DIAGNOSIS — R0902 Hypoxemia: Secondary | ICD-10-CM | POA: Diagnosis present

## 2011-11-20 DIAGNOSIS — Z23 Encounter for immunization: Secondary | ICD-10-CM

## 2011-11-20 DIAGNOSIS — E871 Hypo-osmolality and hyponatremia: Secondary | ICD-10-CM | POA: Diagnosis present

## 2011-11-20 DIAGNOSIS — I5189 Other ill-defined heart diseases: Secondary | ICD-10-CM | POA: Diagnosis present

## 2011-11-20 DIAGNOSIS — Z7982 Long term (current) use of aspirin: Secondary | ICD-10-CM

## 2011-11-20 DIAGNOSIS — R05 Cough: Secondary | ICD-10-CM

## 2011-11-20 DIAGNOSIS — J111 Influenza due to unidentified influenza virus with other respiratory manifestations: Secondary | ICD-10-CM | POA: Diagnosis present

## 2011-11-20 DIAGNOSIS — I10 Essential (primary) hypertension: Secondary | ICD-10-CM | POA: Diagnosis present

## 2011-11-20 DIAGNOSIS — J4489 Other specified chronic obstructive pulmonary disease: Secondary | ICD-10-CM | POA: Diagnosis present

## 2011-11-20 DIAGNOSIS — E869 Volume depletion, unspecified: Secondary | ICD-10-CM | POA: Diagnosis present

## 2011-11-20 DIAGNOSIS — I519 Heart disease, unspecified: Secondary | ICD-10-CM | POA: Diagnosis present

## 2011-11-20 DIAGNOSIS — N400 Enlarged prostate without lower urinary tract symptoms: Secondary | ICD-10-CM | POA: Diagnosis present

## 2011-11-20 DIAGNOSIS — R079 Chest pain, unspecified: Secondary | ICD-10-CM

## 2011-11-20 DIAGNOSIS — J449 Chronic obstructive pulmonary disease, unspecified: Secondary | ICD-10-CM | POA: Diagnosis present

## 2011-11-20 DIAGNOSIS — I959 Hypotension, unspecified: Principal | ICD-10-CM | POA: Diagnosis present

## 2011-11-20 HISTORY — DX: Chronic obstructive pulmonary disease, unspecified: J44.9

## 2011-11-20 HISTORY — DX: Benign prostatic hyperplasia without lower urinary tract symptoms: N40.0

## 2011-11-20 HISTORY — DX: Essential (primary) hypertension: I10

## 2011-11-20 LAB — CBC
HCT: 36.9 % — ABNORMAL LOW (ref 39.0–52.0)
Hemoglobin: 12.9 g/dL — ABNORMAL LOW (ref 13.0–17.0)
MCH: 32.1 pg (ref 26.0–34.0)
MCHC: 35 g/dL (ref 30.0–36.0)
MCV: 91.8 fL (ref 78.0–100.0)
Platelets: 206 10*3/uL (ref 150–400)
RBC: 4.02 MIL/uL — ABNORMAL LOW (ref 4.22–5.81)
RDW: 13.2 % (ref 11.5–15.5)
WBC: 8.9 10*3/uL (ref 4.0–10.5)

## 2011-11-20 LAB — COMPREHENSIVE METABOLIC PANEL
ALT: 19 U/L (ref 0–53)
AST: 18 U/L (ref 0–37)
Albumin: 3.7 g/dL (ref 3.5–5.2)
Alkaline Phosphatase: 69 U/L (ref 39–117)
BUN: 19 mg/dL (ref 6–23)
CO2: 25 mEq/L (ref 19–32)
Calcium: 8.9 mg/dL (ref 8.4–10.5)
Chloride: 97 mEq/L (ref 96–112)
Creatinine, Ser: 1.43 mg/dL — ABNORMAL HIGH (ref 0.50–1.35)
GFR calc Af Amer: 60 mL/min — ABNORMAL LOW (ref >90)
GFR calc non Af Amer: 52 mL/min — ABNORMAL LOW (ref >90)
Glucose, Bld: 95 mg/dL (ref 70–99)
Potassium: 4 mEq/L (ref 3.5–5.1)
Sodium: 132 mEq/L — ABNORMAL LOW (ref 135–145)
Total Bilirubin: 0.3 mg/dL (ref 0.3–1.2)
Total Protein: 7.2 g/dL (ref 6.0–8.3)

## 2011-11-20 LAB — DIFFERENTIAL
Basophils Absolute: 0 10*3/uL (ref 0.0–0.1)
Basophils Relative: 0 % (ref 0–1)
Eosinophils Absolute: 0.1 10*3/uL (ref 0.0–0.7)
Eosinophils Relative: 1 % (ref 0–5)
Lymphocytes Relative: 12 % (ref 12–46)
Lymphs Abs: 1.1 10*3/uL (ref 0.7–4.0)
Monocytes Absolute: 0.6 10*3/uL (ref 0.1–1.0)
Monocytes Relative: 7 % (ref 3–12)
Neutro Abs: 7 10*3/uL (ref 1.7–7.7)
Neutrophils Relative %: 79 % — ABNORMAL HIGH (ref 43–77)

## 2011-11-20 LAB — URINALYSIS, ROUTINE W REFLEX MICROSCOPIC
Bilirubin Urine: NEGATIVE
Glucose, UA: NEGATIVE mg/dL
Hgb urine dipstick: NEGATIVE
Ketones, ur: NEGATIVE mg/dL
Leukocytes, UA: NEGATIVE
Nitrite: NEGATIVE
Protein, ur: NEGATIVE mg/dL
Specific Gravity, Urine: 1.018 (ref 1.005–1.030)
Urobilinogen, UA: 1 mg/dL (ref 0.0–1.0)
pH: 7 (ref 5.0–8.0)

## 2011-11-20 LAB — INFLUENZA PANEL BY PCR (TYPE A & B)
H1N1 flu by pcr: NOT DETECTED
Influenza A By PCR: POSITIVE — AB
Influenza B By PCR: NEGATIVE

## 2011-11-20 LAB — LACTIC ACID, PLASMA: Lactic Acid, Venous: 0.9 mmol/L (ref 0.5–2.2)

## 2011-11-20 LAB — PROCALCITONIN: Procalcitonin: <0.1 ng/mL

## 2011-11-20 LAB — TROPONIN I: Troponin I: <0.3 ng/mL (ref <0.30)

## 2011-11-20 MED ORDER — ONDANSETRON HCL 4 MG/2ML IJ SOLN: 4.0000 mg | Freq: Four times a day (QID) | INTRAMUSCULAR | Status: DC | PRN

## 2011-11-20 MED ORDER — ACETAMINOPHEN 325 MG PO TABS: 650.0000 mg | ORAL_TABLET | Freq: Four times a day (QID) | ORAL | Status: DC | PRN

## 2011-11-20 MED ORDER — DEXTROSE 5 % IV SOLN
500.0000 mg | INTRAVENOUS | Status: DC
  Administered 2011-11-21 (×2): 500 mg via INTRAVENOUS
  Filled 2011-11-20 (×2): qty 500

## 2011-11-20 MED ORDER — DEXTROSE 5 % IV SOLN
500.0000 mg | Freq: Once | INTRAVENOUS | Status: AC
  Administered 2011-11-20: 500 mg via INTRAVENOUS
  Filled 2011-11-20: qty 500

## 2011-11-20 MED ORDER — SODIUM CHLORIDE 0.9 % IV SOLN
INTRAVENOUS | Status: DC
  Administered 2011-11-20 – 2011-11-21 (×3): via INTRAVENOUS

## 2011-11-20 MED ORDER — ALBUTEROL (5 MG/ML) CONTINUOUS INHALATION SOLN: 10.0000 mg/h | INHALATION_SOLUTION | RESPIRATORY_TRACT | Status: DC

## 2011-11-20 MED ORDER — DEXTROSE 5 % IV SOLN
1.0000 g | Freq: Once | INTRAVENOUS | Status: AC
  Administered 2011-11-20: 1 g via INTRAVENOUS
  Filled 2011-11-20: qty 10

## 2011-11-20 MED ORDER — SODIUM CHLORIDE 0.9 % IV BOLUS (SEPSIS)
1000.0000 mL | Freq: Once | INTRAVENOUS | Status: AC
  Administered 2011-11-20: 1000 mL via INTRAVENOUS

## 2011-11-20 MED ORDER — DEXTROSE 5 % IV SOLN
1.0000 g | INTRAVENOUS | Status: DC
  Administered 2011-11-21 (×2): 1 g via INTRAVENOUS
  Filled 2011-11-20 (×2): qty 10

## 2011-11-20 MED ORDER — PANTOPRAZOLE SODIUM 40 MG PO TBEC
40.0000 mg | DELAYED_RELEASE_TABLET | Freq: Every day | ORAL | Status: DC
  Administered 2011-11-21 – 2011-11-22 (×2): 40 mg via ORAL
  Filled 2011-11-20 (×2): qty 1

## 2011-11-20 MED ORDER — ONDANSETRON HCL 4 MG PO TABS: 4.0000 mg | ORAL_TABLET | Freq: Four times a day (QID) | ORAL | Status: DC | PRN

## 2011-11-20 MED ORDER — ACETAMINOPHEN 650 MG RE SUPP: 650.0000 mg | Freq: Four times a day (QID) | RECTAL | Status: DC | PRN

## 2011-11-20 MED ORDER — ALBUTEROL SULFATE (5 MG/ML) 0.5% IN NEBU: 2.5000 mg | INHALATION_SOLUTION | RESPIRATORY_TRACT | Status: DC | PRN

## 2011-11-20 MED ORDER — ALBUTEROL SULFATE (5 MG/ML) 0.5% IN NEBU
2.5000 mg | INHALATION_SOLUTION | Freq: Four times a day (QID) | RESPIRATORY_TRACT | Status: DC
  Administered 2011-11-21 (×3): 2.5 mg via RESPIRATORY_TRACT
  Filled 2011-11-20 (×3): qty 0.5

## 2011-11-20 NOTE — ED Provider Notes (Signed)
History     CSN: [id number]  Arrival date & time 11/20/11  1640   First MD Initiated Contact with Patient 11/20/11 1643      Chief Complaint  Patient presents with  . Shortness of Breath  . Hypotension    (Consider location/radiation/quality/duration/timing/severity/associated sxs/prior treatment) Patient is a 60 y.o. male presenting with shortness of breath. The history is provided by the patient and the spouse. No language interpreter was used.  Shortness of Breath  The current episode started yesterday. The onset was gradual. The problem occurs continuously. The problem has been gradually worsening. The problem is moderate. The symptoms are relieved by beta-agonist inhalers (Oxygen). The symptoms are aggravated by nothing. Associated symptoms include a fever, cough, shortness of breath and wheezing (mild). Pertinent negatives include no chest pain and no chest pressure. There was no intake of a foreign body. He has not inhaled smoke recently.    History reviewed. No pertinent past medical history.  No past surgical history on file.  No family history on file.  History  Substance Use Topics  . Smoking status: Not on file  . Smokeless tobacco: Not on file  . Alcohol Use: Not on file      Review of Systems  Constitutional: Positive for fever and chills.  Respiratory: Positive for cough, shortness of breath and wheezing (mild).   Cardiovascular: Negative for chest pain and leg swelling.  Gastrointestinal: Negative for nausea and vomiting.  All other systems reviewed and are negative.    Allergies  Oxycodone-acetaminophen and Talwin  Home Medications   Current Outpatient Rx  Name Route Sig Dispense Refill  . ACETAMINOPHEN 500 MG PO TABS Oral Take 1,000 mg by mouth every 6 (six) hours as needed. For pain      . ALBUTEROL SULFATE HFA 108 (90 BASE) MCG/ACT IN AERS Inhalation Inhale 2 puffs into the lungs every 6 (six) hours as needed. Wheezing/shortness of breath       . ASPIRIN EC 81 MG PO TBEC Oral Take 81 mg by mouth daily.      Marland Kitchen PANTOPRAZOLE SODIUM 40 MG PO TBEC Oral Take 40 mg by mouth daily.      Marland Kitchen TAMSULOSIN HCL 0.4 MG PO CAPS Oral Take 0.4 mg by mouth daily.      . TELMISARTAN-HCTZ 80-25 MG PO TABS Oral Take 0.5 tablets by mouth daily.        SpO2 98%  Physical Exam  Nursing note and vitals reviewed. Constitutional: He is oriented to person, place, and time. He appears well-developed and well-nourished. No distress.  HENT:  Head: Normocephalic and atraumatic.  Eyes: EOM are normal. Pupils are equal, round, and reactive to light.  Neck: Normal range of motion. Neck supple.  Cardiovascular: Normal rate and normal heart sounds.   No murmur heard. Pulmonary/Chest: He is in respiratory distress (mild). He has wheezes (diffuse). He has no rales.       Mild rhonchi, L > R  Abdominal: Soft. He exhibits no distension. There is no tenderness.  Musculoskeletal: Normal range of motion. He exhibits no edema.  Neurological: He is alert and oriented to person, place, and time.  Skin: He is not diaphoretic.    ED Course  Procedures (including critical care time)   Labs Reviewed  CBC  DIFFERENTIAL  COMPREHENSIVE METABOLIC PANEL  INFLUENZA PANEL BY PCR  CULTURE, BLOOD (ROUTINE X 2)  CULTURE, BLOOD (ROUTINE X 2)  LACTIC ACID, PLASMA  TROPONIN I   Dg Chest [facility] [address]  11/20/2011  *RADIOLOGY REPORT*  Clinical Data: Cough, fever  PORTABLE CHEST - 1 VIEW  Comparison: 01/14/2011  Findings: Bibasilar atelectasis / scarring.  Lungs otherwise clear. No pleural effusion or pneumothorax.  Cardiomediastinal silhouette is within normal limits.  Cervical spine fixation hardware.  IMPRESSION: No evidence of acute cardiopulmonary disease.  Bibasilar atelectasis / scarring.  Original Report Authenticated By: Charline Bills, M.D.     No diagnosis found. Diagnoses: Influenza Hypotension Cough   Influenza panel by pcr (Final result)  Abnormal  Component  (Lab Inquiry)      Result Time  Influenza A By PCR  Influenza B By PCR  H1N1 flu by pcr    11/20/11 2241  POSITIVE (A)  NEGATIVE  NOT DETECTED The Xpert Flu assay (FDA approved for nasal aspirates or washes and nasopharyngeal swab specimens), is intended as an aid in the diagnosis of influenza and should not be used as a sole basis for treatment.         Procalcitonin (Final result)   Component (Lab Inquiry)      Result Time  Procalcitonin    11/20/11 2216  <0.10 PCT (Procalcitonin) <= 0.5 ng/mL: Systemic infection (sepsis) is not likely. Local bacterial infection is possible. PCT > 0.5 ng/mL and <= 2 ng/mL: Systemic infection (sepsis) is possible, but other conditions are known to elevate PCT as well. PCT > 2 ng/mL: Systemic infection (sepsis) is likely, unless other causes are known. PCT >= 10 ng/mL: Important systemic inflammatory response, almost exclusively due to severe bacterial sepsis or septic shock.         Urinalysis with microscopic (Final result)   Component (Lab Inquiry)      Result Time  Color, Urine  APPearance  Specific Gravity, Urine  pH  GLUCOSE U    11/20/11 2125  YELLOW  CLEAR  1.018  7.0  NEGATIVE           Result Time  Hgb urine dipstick  BILI UR  Ketones, ur  Protein, ur  Urobilinogen, UA    11/20/11 2125  NEGATIVE  NEGATIVE  NEGATIVE  NEGATIVE  1.0           Result Time  Nitrite  LEUKOCYTES    11/20/11 2125  NEGATIVE  NEGATIVE MICROSCOPIC NOT DONE ON URINES WITH NEGATIVE PROTEIN, BLOOD, LEUKOCYTES, NITRITE, OR GLUCOSE <1000 mg/dL.         Lactic acid, plasma (Final result)   Component (Lab Inquiry)      Result Time  Lactic Acid, Venous    11/20/11 2115  0.9         Troponin I (Final result)   Component (Lab Inquiry)      Result Time  TROPONIN I    11/20/11 1809  <0.30 Due to the release kinetics of cTnI, a negative result within the first hours of the onset of symptoms does not rule out myocardial infarction with certainty. If myocardial  infarction is still suspected, repeat the test at appropriate intervals.         Comprehensive metabolic panel (Final result)  Abnormal  Component (Lab Inquiry)      Result Time  NA  K  CL  CO2  GLUCOSE    11/20/11 1808  132 (L)  4.0  97  25  95           Result Time  BUN  Creatinine, Ser  CALCIUM  PROTEIN  Albumin    11/20/11 1808  19  1.43 (H)  8.9  7.2  3.7           Result Time  AST  ALT  ALK PHOS  BILI TOTL  GFR calc non Af Amer    11/20/11 1808  18  19  69  0.3  52 (L)           Result Time  GFR calc Af Amer    11/20/11 1808  60 (L) The eGFR has been calculated using the CKD EPI equation. This calculation has not been validated in all clinical situations. eGFR's persistently <90 mL/min signify possible Chronic Kidney Disease.         CBC (Final result)  Abnormal  Component (Lab Inquiry)      Result Time  WBC  RBC  HGB  HCT  MCV    11/20/11 1744  8.9  4.02 (L)  12.9 (L)  36.9 (L)  91.8           Result Time  MCH  MCHC  RDW  PLT    11/20/11 1744  32.1  35.0  13.2  206         Differential (Final result)  Abnormal  Component (Lab Inquiry)      Result Time  NEUTRO PCT  AB NEUTRO  LYMPHO PCT  AB LYM  MONO PCT    11/20/11 1744  79 (H)  7.0  12  1.1  7           Result Time  MONO ABS  EOS PCT  EOSINO ABS  BASOS PCT  BASOS ABS    11/20/11 1744  0.6  1  0.1  0  0.0        MDM  83M p/w fever, cough from PCP's office. Was hypotensive and hypoxic there. Syncopal episode in office, slumped over on exam table, no hard fall onto the floor. No flu shot this year. Hx of COPD, ho cardiac history. On arrival on 6L on non-rebreather, states his dyspnea is improving. Speaking in complete sentences. Initial BP 102/64, improving from PCP office. Lungs with diffuse wheezing, mild rhonchi on the L. No peripheral edema. Concern for pneumonia vs the flu. Labs including blood cultures drawn, CXR orderd. Community-acquired pneumonia antibiotics ordered. Fluids given. Albuterol nebs  given.  Influenza positive. No elevation in WBC.  Internal medicine consulted and admitting to step down unit. Going to higher acuity bed since received 4L of fluid for hypotension in the ED.      Elwin Mocha, MD 11/20/11 [phone]

## 2011-11-20 NOTE — H&P (Signed)
Shane Clark is an 60 y.o. male.  PCP - Dr.Robert Ehinger. Chief Complaint: Shortness of breath and loss of consciousness. HPI: 60 year-old male with history of hypertension COPD, was having shortness of breath since yesterday with cough nonproductive and subjective feeling of fever and chills since his symptoms did not improve he went to his PCPs office today later in the evening accompanied by his daughter. While sitting at the exam room patient suddenly lost consciousness but does not recall how long it was. He regained his consciousness while at the office itself and feels maybe less than 2 minutes. He did not have any headache or any focal deficit, did not have any palpitations or incontinence of urine or bowels prior or after the incident. Patient was found to be hypotensive. He was brought to the ER he was given at least 3 L of fluid normal saline after which his blood pressure improved from systolic of 70 now it is around 110. Patient does not feel short of breath and he feels much better. Denies any nausea vomiting abdominal pain dysuria or discharges or diarrhea. Has not had any recent travel.  Past Medical History  Diagnosis Date  . Hypertension   . COPD (chronic obstructive pulmonary disease)   . BPH (benign prostatic hyperplasia)     Past Surgical History  Procedure Date  . Appendectomy   . Hernia repair   . Back surgery   . Knee surgery   . Surgery for small bowel obstruction     Family History  Problem Relation Age of Onset  . Coronary artery disease Father   . Diabetes type II Other    Social History:  reports that he has quit smoking. He does not have any smokeless tobacco history on file. He reports that he does not drink alcohol. His drug history not on file.  Allergies:  Allergies  Allergen Reactions  . Oxycodone-Acetaminophen Anaphylaxis  . Advair Diskus (Fluticasone-Salmeterol)   . Talwin Nausea Only and Other (See Comments)    Hallucinations     Medications Prior to Admission  Medication Dose Route Frequency Provider Last Rate Last Dose  . albuterol (PROVENTIL,VENTOLIN) solution continuous neb  10 mg/hr Nebulization Continuous Elwin Mocha, MD      . azithromycin (ZITHROMAX) 500 mg in dextrose 5 % 250 mL IVPB  500 mg Intravenous Once Elwin Mocha, MD   500 mg at 11/20/11 2043  . cefTRIAXone (ROCEPHIN) 1 g in dextrose 5 % 50 mL IVPB  1 g Intravenous Once Elwin Mocha, MD   1 g at 11/20/11 1928  . sodium chloride 0.9 % bolus 1,000 mL  1,000 mL Intravenous Once Elwin Mocha, MD   1,000 mL at 11/20/11 1707  . sodium chloride 0.9 % bolus 1,000 mL  1,000 mL Intravenous Once Elwin Mocha, MD   1,000 mL at 11/20/11 1816  . sodium chloride 0.9 % bolus 1,000 mL  1,000 mL Intravenous Once Elwin Mocha, MD   1,000 mL at 11/20/11 2023   No current outpatient prescriptions on file as of 11/20/2011.    Results for orders placed during the hospital encounter of 11/20/11 (from the past 48 hour(s))  CBC     Status: Abnormal   Collection Time   11/20/11  5:04 PM      Component Value Range Comment   WBC 8.9  4.0 - 10.5 (K/uL)    RBC 4.02 (*) 4.22 - 5.81 (MIL/uL)    Hemoglobin 12.9 (*) 13.0 - 17.0 (g/dL)  HCT 36.9 (*) 39.0 - 52.0 (%)    MCV 91.8  78.0 - 100.0 (fL)    MCH 32.1  26.0 - 34.0 (pg)    MCHC 35.0  30.0 - 36.0 (g/dL)    RDW 16.1  09.6 - 04.5 (%)    Platelets 206  150 - 400 (K/uL)   DIFFERENTIAL     Status: Abnormal   Collection Time   11/20/11  5:04 PM      Component Value Range Comment   Neutrophils Relative 79 (*) 43 - 77 (%)    Neutro Abs 7.0  1.7 - 7.7 (K/uL)    Lymphocytes Relative 12  12 - 46 (%)    Lymphs Abs 1.1  0.7 - 4.0 (K/uL)    Monocytes Relative 7  3 - 12 (%)    Monocytes Absolute 0.6  0.1 - 1.0 (K/uL)    Eosinophils Relative 1  0 - 5 (%)    Eosinophils Absolute 0.1  0.0 - 0.7 (K/uL)    Basophils Relative 0  0 - 1 (%)    Basophils Absolute 0.0  0.0 - 0.1 (K/uL)   COMPREHENSIVE METABOLIC PANEL     Status:  Abnormal   Collection Time   11/20/11  5:04 PM      Component Value Range Comment   Sodium 132 (*) 135 - 145 (mEq/L)    Potassium 4.0  3.5 - 5.1 (mEq/L)    Chloride 97  96 - 112 (mEq/L)    CO2 25  19 - 32 (mEq/L)    Glucose, Bld 95  70 - 99 (mg/dL)    BUN 19  6 - 23 (mg/dL)    Creatinine, Ser 4.09 (*) 0.50 - 1.35 (mg/dL)    Calcium 8.9  8.4 - 10.5 (mg/dL)    Total Protein 7.2  6.0 - 8.3 (g/dL)    Albumin 3.7  3.5 - 5.2 (g/dL)    AST 18  0 - 37 (U/L)    ALT 19  0 - 53 (U/L)    Alkaline Phosphatase 69  39 - 117 (U/L)    Total Bilirubin 0.3  0.3 - 1.2 (mg/dL)    GFR calc non Af Amer 52 (*) >90 (mL/min)    GFR calc Af Amer 60 (*) >90 (mL/min)   TROPONIN I     Status: Normal   Collection Time   11/20/11  5:27 PM      Component Value Range Comment   Troponin I <0.30  <0.30 (ng/mL)    Dg Chest Port 1 View  11/20/2011  *RADIOLOGY REPORT*  Clinical Data: Cough, fever  PORTABLE CHEST - 1 VIEW  Comparison: 01/14/2011  Findings: Bibasilar atelectasis / scarring.  Lungs otherwise clear. No pleural effusion or pneumothorax.  Cardiomediastinal silhouette is within normal limits.  Cervical spine fixation hardware.  IMPRESSION: No evidence of acute cardiopulmonary disease.  Bibasilar atelectasis / scarring.  Original Report Authenticated By: Charline Bills, M.D.    Review of Systems  Constitutional: Positive for fever and chills.  HENT: Negative.   Eyes: Negative.   Respiratory: Positive for cough, shortness of breath and wheezing.   Cardiovascular: Negative.   Gastrointestinal: Negative.   Genitourinary: Negative.   Musculoskeletal: Negative.   Skin: Negative.   Neurological: Positive for loss of consciousness.  Endo/Heme/Allergies: Negative.   Psychiatric/Behavioral: Negative.     Blood pressure 102/50, pulse 96, temperature 98.3 F (36.8 C), temperature source Oral, resp. rate 21, SpO2 94.00%. Physical Exam  Constitutional: He is oriented to  person, place, and time. He appears  well-developed and well-nourished. No distress.  HENT:  Head: Normocephalic and atraumatic.  Right Ear: External ear normal.  Left Ear: External ear normal.  Nose: Nose normal.  Mouth/Throat: Oropharynx is clear and moist. No oropharyngeal exudate.  Eyes: Conjunctivae and EOM are normal. Pupils are equal, round, and reactive to light. Right eye exhibits no discharge. Left eye exhibits no discharge. No scleral icterus.  Neck: Normal range of motion. Neck supple. No JVD present.  Cardiovascular: Normal rate, regular rhythm, normal heart sounds and intact distal pulses.   Respiratory: Effort normal and breath sounds normal. No respiratory distress. He has no wheezes. He has no rales.  GI: Soft. Bowel sounds are normal. He exhibits no distension. There is no tenderness. There is no rebound.  Musculoskeletal: Normal range of motion. He exhibits no edema and no tenderness.  Neurological: He is alert and oriented to person, place, and time. He has normal reflexes. No cranial nerve deficit.       Moves upper and lower limbs 5/5. No facial symmetry.  Skin: Skin is warm and dry. No rash noted. He is not diaphoretic. No erythema.  Psychiatric: His behavior is normal.     Assessment/Plan #1. Hypotension - probably this could be a vasovagal phenomenon secondary to his persistent cough and shortness of breath. His loss of consciousness could also be from the same reason. Presently he is stable and responding to fluid. He does not have any fever or leukocytosis. For now we will keep patient on empiric antibiotics which will cover for community-acquired pneumonia and also UTI. We'll hold off his antihypertensives. Blood cultures and urine cultures have been obtained we'll follow them and also follow flu PCR. Until then patient be on droplet precautions. Lactic acid procalcitonin levels have been drawn along with cardiac enzymes which will have to be followed. Patient at this time denies any chest pain or  shortness of breath his EKG shows normal sinus rhythm with nonspecific ST changes. #2. COPD - presently stable continue with albuterol nebulizers. #3. History of hypertension - see #1 reason. #4. History of BPH - hold off tamsulosin for now.  CODE STATUS - full code  Lynna Zamorano N. 11/20/2011, 8:55 PM

## 2011-11-20 NOTE — ED Provider Notes (Signed)
I saw and evaluated the patient, reviewed the resident's note and I agree with the findings and plan.  60yM with fever, cough and hypotension.  Pt is influenza positive and clinical symptoms consistent with this. On exam with mild tachypnea and  diffuse end expiratory wheezing. Given hypoxia, hypotension  And comorbidities will admit for further tx.   Raeford Razor, MD 11/20/11 [phone]

## 2011-11-20 NOTE — ED Notes (Signed)
Admitting MD at bedside, pt awaiting inpt beds assignment.  

## 2011-11-20 NOTE — ED Notes (Signed)
Pt was transported from pcp office after having syncopable episode at dr office. Pt b/p was 70/40 sitting at pcp office and sats on RA were 85%. Pt has wheezes noted bilat, neb tx in process, ivf infusing per ems. Pt is alert and oriented and able to speak in solid sentences

## 2011-11-20 NOTE — ED Notes (Signed)
Pt refused rectal temp.

## 2011-11-20 NOTE — ED Provider Notes (Signed)
I saw and evaluated the patient, reviewed the resident's note and I agree with the findings and plan.  EKG was personally reviewed by myself.  60yM sent for evaluation of fever, hypotension, hypoxia and syncopal episode in outpt office. Clinically suspect flu. CXR clear but given symptoms was give dose of abx for CAP empirically. Admit for further evaluation and treatment.   Raeford Razor, MD 11/27/11 [phone]

## 2011-11-21 DIAGNOSIS — J449 Chronic obstructive pulmonary disease, unspecified: Secondary | ICD-10-CM | POA: Diagnosis present

## 2011-11-21 DIAGNOSIS — J111 Influenza due to unidentified influenza virus with other respiratory manifestations: Secondary | ICD-10-CM | POA: Diagnosis present

## 2011-11-21 DIAGNOSIS — E869 Volume depletion, unspecified: Secondary | ICD-10-CM | POA: Diagnosis present

## 2011-11-21 DIAGNOSIS — E871 Hypo-osmolality and hyponatremia: Secondary | ICD-10-CM | POA: Diagnosis present

## 2011-11-21 DIAGNOSIS — I5189 Other ill-defined heart diseases: Secondary | ICD-10-CM | POA: Diagnosis present

## 2011-11-21 DIAGNOSIS — N400 Enlarged prostate without lower urinary tract symptoms: Secondary | ICD-10-CM | POA: Diagnosis present

## 2011-11-21 LAB — COMPREHENSIVE METABOLIC PANEL
ALT: 18 U/L (ref 0–53)
AST: 19 U/L (ref 0–37)
Albumin: 3.4 g/dL — ABNORMAL LOW (ref 3.5–5.2)
Alkaline Phosphatase: 64 U/L (ref 39–117)
BUN: 13 mg/dL (ref 6–23)
CO2: 23 mEq/L (ref 19–32)
Calcium: 8.5 mg/dL (ref 8.4–10.5)
Chloride: 103 mEq/L (ref 96–112)
Creatinine, Ser: 1.15 mg/dL (ref 0.50–1.35)
GFR calc Af Amer: 78 mL/min — ABNORMAL LOW (ref >90)
GFR calc non Af Amer: 67 mL/min — ABNORMAL LOW (ref >90)
Glucose, Bld: 94 mg/dL (ref 70–99)
Potassium: 4 mEq/L (ref 3.5–5.1)
Sodium: 134 mEq/L — ABNORMAL LOW (ref 135–145)
Total Bilirubin: 0.2 mg/dL — ABNORMAL LOW (ref 0.3–1.2)
Total Protein: 7.1 g/dL (ref 6.0–8.3)

## 2011-11-21 LAB — URINE CULTURE
Colony Count: 5000
Culture  Setup Time: 201212202203

## 2011-11-21 LAB — CBC
HCT: 36.6 % — ABNORMAL LOW (ref 39.0–52.0)
Hemoglobin: 12.6 g/dL — ABNORMAL LOW (ref 13.0–17.0)
MCH: 31.6 pg (ref 26.0–34.0)
MCHC: 34.4 g/dL (ref 30.0–36.0)
MCV: 91.7 fL (ref 78.0–100.0)
Platelets: 177 10*3/uL (ref 150–400)
RBC: 3.99 MIL/uL — ABNORMAL LOW (ref 4.22–5.81)
RDW: 13.5 % (ref 11.5–15.5)
WBC: 6.1 10*3/uL (ref 4.0–10.5)

## 2011-11-21 LAB — CARDIAC PANEL(CRET KIN+CKTOT+MB+TROPI)
CK, MB: 2.2 ng/mL (ref 0.3–4.0)
CK, MB: 2.1 ng/mL (ref 0.3–4.0)
Relative Index: INVALID (ref 0.0–2.5)
Total CK: 90 U/L (ref 7–232)
Troponin I: <0.3 ng/mL (ref <0.30)
Troponin I: <0.3 ng/mL (ref <0.30)

## 2011-11-21 LAB — BASIC METABOLIC PANEL
CO2: 22 mEq/L (ref 19–32)
Calcium: 8.4 mg/dL (ref 8.4–10.5)
GFR calc Af Amer: 80 mL/min — ABNORMAL LOW (ref >90)
GFR calc non Af Amer: 69 mL/min — ABNORMAL LOW (ref >90)
Sodium: 134 mEq/L — ABNORMAL LOW (ref 135–145)

## 2011-11-21 LAB — LACTIC ACID, PLASMA: Lactic Acid, Venous: 0.8 mmol/L (ref 0.5–2.2)

## 2011-11-21 LAB — INFLUENZA PANEL BY PCR (TYPE A & B)
H1N1 flu by pcr: NOT DETECTED
Influenza A By PCR: NEGATIVE
Influenza B By PCR: NEGATIVE

## 2011-11-21 MED ORDER — ALBUTEROL SULFATE (5 MG/ML) 0.5% IN NEBU
2.5000 mg | INHALATION_SOLUTION | Freq: Two times a day (BID) | RESPIRATORY_TRACT | Status: DC
  Filled 2011-11-21: qty 0.5

## 2011-11-21 MED ORDER — OSELTAMIVIR PHOSPHATE 75 MG PO CAPS
75.0000 mg | ORAL_CAPSULE | Freq: Two times a day (BID) | ORAL | Status: DC
  Administered 2011-11-21 – 2011-11-22 (×3): 75 mg via ORAL
  Filled 2011-11-21 (×5): qty 1

## 2011-11-21 MED ORDER — OSELTAMIVIR PHOSPHATE 75 MG PO CAPS
75.0000 mg | ORAL_CAPSULE | Freq: Two times a day (BID) | ORAL | Status: DC
  Filled 2011-11-21 (×3): qty 1

## 2011-11-21 MED ORDER — INFLUENZA VIRUS VACC SPLIT PF IM SUSP
0.5000 mL | INTRAMUSCULAR | Status: AC
  Administered 2011-11-21: 0.5 mL via INTRAMUSCULAR
  Filled 2011-11-21: qty 0.5

## 2011-11-21 NOTE — Progress Notes (Signed)
Nursing note- Pt transferred from 2600.  NAD noted.  Denies any c/os.  Oriented to rm.  Call bell within reach.

## 2011-11-21 NOTE — Progress Notes (Signed)
Subjective: Endorses feeling much better than prior to admission and denies chest pain shortness of breath or cough. He denies previous episodes of symptoms that may be concerning for possible TIA/CVA such as presyncope or syncope, dizziness, visual disturbance, or difficulty in speaking, movement, or extremity numbness or tingling.  Objective: Vital signs in last 24 hours: Temp:  [97.7 F (36.5 C)-98.6 F (37 C)] 97.7 F (36.5 C) (12/21 0800) Pulse Rate:  [63-96] 68  (12/21 0800) Resp:  [15-25] 15  (12/21 0800) BP: (78-119)/(50-72) 109/72 mmHg (12/21 0800) SpO2:  [93 %-98 %] 95 % (12/21 0821) Weight:  [135 kg (297 lb 9.9 oz)] 297 lb 9.9 oz (135 kg) (12/21 0500) Weight change:  Last BM Date: 11/20/11  Intake/Output from previous day: 12/20 0701 - 12/21 0700 In: 1770 [P.O.:120; I.V.:1350; IV Piggyback:300] Out: 1500 [Urine:1500] Intake/Output this shift: Total I/O In: 460 [P.O.:360; I.V.:100] Out: -   General appearance: alert, cooperative, appears stated age and no distress Resp: Somewhat coarse in the bases but otherwise clear to auscultation bilaterally, has been weaned off of nasal cannula oxygen and is currently maintaining O2 saturations of 93-96% on room air. Cardio: regular rate and rhythm, S1, S2 normal, no murmur, click, rub or gallop, IV fluids are at 150 cc per hour, orthostatic vital signs have been checked since admission and are within normal limits GI: soft, non-tender; bowel sounds normal; no masses,  no organomegaly Extremities: extremities normal, atraumatic, no cyanosis or edema Neurologic: Grossly normal, alert and oriented x3, exam nonfocal  Lab Results:  [facility] 11/21/11 0713 11/20/11 1704  WBC 6.1 8.9  HGB 12.6* 12.9*  HCT 36.6* 36.9*  PLT 177 206   BMET  Basename 11/21/11 0810 11/21/11 0713  NA 134* 134*  K 4.0 4.0  CL 104 103  CO2 22 23  GLUCOSE 97 94  BUN 13 13  CREATININE 1.13 1.15  CALCIUM 8.4 8.5    Studies/Results: Dg Chest Port 1  View  11/20/2011  *RADIOLOGY REPORT*  Clinical Data: Cough, fever  PORTABLE CHEST - 1 VIEW  Comparison: 01/14/2011  Findings: Bibasilar atelectasis / scarring.  Lungs otherwise clear. No pleural effusion or pneumothorax.  Cardiomediastinal silhouette is within normal limits.  Cervical spine fixation hardware.  IMPRESSION: No evidence of acute cardiopulmonary disease.  Bibasilar atelectasis / scarring.  Original Report Authenticated By: Charline Bills, M.D.    Medications:  I have reviewed the patient's current medications. Scheduled:   . albuterol  2.5 mg Nebulization Q6H  . azithromycin  500 mg Intravenous Once  . azithromycin  500 mg Intravenous Q24H  . cefTRIAXone (ROCEPHIN)  IV  1 g Intravenous Once  . cefTRIAXone (ROCEPHIN)  IV  1 g Intravenous Q24H  . influenza  inactive virus vaccine  0.5 mL Intramuscular Tomorrow-1000  . oseltamivir  75 mg Oral BID  . pantoprazole  40 mg Oral Q1200  . sodium chloride  1,000 mL Intravenous Once  . sodium chloride  1,000 mL Intravenous Once  . sodium chloride  1,000 mL Intravenous Once  . DISCONTD: oseltamivir  75 mg Oral BID    Assessment/Plan:  Principal Problem:  *Hypotension/syncope/ Volume depletion The patient's blood pressure is much improved with adequate hydration. We'll continue to hold usual antihypertensive medications. Based on history obtained feel that syncopal symptoms were due to volume depletion and associated hypotension and not cardiac or neurologically mediated.  Active Problems:    Influenza/Cough/ Bronchitis with flu Influenza PCR is positive for flu A. Will treat symptomatically. Symptoms began  less than 72 hours ago so we'll initiate Tamiflu. Interestingly enough patient has not had very high fever or any fever at all since admission. We will continue antibiotics of Rocephin and Zithromax to cover for possible secondary bacterial bronchitis that could evolve into pneumonia  in this patient with underlying COPD.  Continue supportive care with nebulizers and oxygen as indicated.   Hyponatremia with extracellular fluid depletion Multifactorial likely related to volume depletion at presentation. He was also on a thiazide diuretic prior to admission. Follow electrolytes panel.   Diastolic dysfunction Per echo from 2010 patient has mild diastolic dysfunction it was migrated to this is likely grade 1. Because his blood pressure is greater than 90 and he is making adequate urine output I will decrease his IV fluids to 100 cc an hour to avoid possible volume overload.   HYPERTENSION, BENIGN He was taking my card is HCT at home. Because his blood pressure is still soft we'll continue to hold this medication.   COPD (chronic obstructive pulmonary disease) Despite having acute flu, he is not demonstrating any symptoms of COPD exacerbation. We'll monitor closely. We'll continue nebulizer treatments.   BPH (benign prostatic hyperplasia) Is normally on Flomax at home but do to soft blood pressures we will hold this medication for now.   Disposition Vital signs are stable enough that he can transfer to a non-telemetry floor. Based on history obtained is syncope does not appear to be cardiac or neurologically mediated and is more likely related to volume depletion and hypotension in setting of acute flu in patient taking antihypertensive medications with diuretics.   LOS: 1 day   Junious Silk, ANP pager [phone]  [facility] 8 [email or web address] Password: [email or web address]  11/21/2011, 10:06 AM

## 2011-11-21 NOTE — Progress Notes (Signed)
Pt to be transferred to unit 5000, report given to receiving nurse-Angela. Pt and family educated on transfer, new med-tamiflu (with handout education given), and poc.  All verbalized their understanding of teachings.  Pt is stable, will transport via wheel chair, off monitor.

## 2011-11-22 MED ORDER — OSELTAMIVIR PHOSPHATE 75 MG PO CAPS: 75.0000 mg | ORAL_CAPSULE | Freq: Two times a day (BID) | ORAL | Status: AC

## 2011-11-22 MED ORDER — TELMISARTAN-HCTZ 80-25 MG PO TABS: 0.5000 | ORAL_TABLET | Freq: Every day | ORAL | Status: DC

## 2011-11-22 MED ORDER — MORPHINE SULFATE 2 MG/ML IJ SOLN: 2.0000 mg | INTRAMUSCULAR | Status: DC

## 2011-11-22 NOTE — Discharge Summary (Signed)
PATIENT DETAILS Name: Shane Clark Age: 60 y.o. Sex: male Date of Birth: Apr 07, 1951 MRN: [id number]. Admit Date: 11/20/2011 Admitting Physician: Jeoffrey Massed PCP:No primary provider on file.  PRIMARY DISCHARGE DIAGNOSIS:  Principal Problem:  *Hypotension Active Problems:  HYPERTENSION, BENIGN  Influenza  Cough  Volume depletion  COPD (chronic obstructive pulmonary disease)  Bronchitis with flu  Hyponatremia with extracellular fluid depletion  Diastolic dysfunction  BPH (benign prostatic hyperplasia)      PAST MEDICAL HISTORY: Past Medical History  Diagnosis Date  . Hypertension   . COPD (chronic obstructive pulmonary disease)   . BPH (benign prostatic hyperplasia)     DISCHARGE MEDICATIONS: Current Discharge Medication List    START taking these medications   Details  oseltamivir (TAMIFLU) 75 MG capsule Take 1 capsule (75 mg total) by mouth 2 (two) times daily. Qty: 7 capsule, Refills: 0      CONTINUE these medications which have CHANGED   Details  telmisartan-hydrochlorothiazide (MICARDIS HCT) 80-25 MG per tablet Take 0.5 tablets by mouth daily.      CONTINUE these medications which have NOT CHANGED   Details  acetaminophen (TYLENOL) 500 MG tablet Take 1,000 mg by mouth every 6 (six) hours as needed. For pain      albuterol (PROVENTIL HFA;VENTOLIN HFA) 108 (90 BASE) MCG/ACT inhaler Inhale 2 puffs into the lungs every 6 (six) hours as needed. Wheezing/shortness of breath     aspirin EC 81 MG tablet Take 81 mg by mouth daily.      pantoprazole (PROTONIX) 40 MG tablet Take 40 mg by mouth daily.      Tamsulosin HCl (FLOMAX) 0.4 MG CAPS Take 0.4 mg by mouth daily.           BRIEF HPI:  See H&P, Labs, Consult and Test reports for all details in brief, patient was admitted for Shortness of breath and hypotension. For further details please see the H&P done on admission.  CONSULTATIONS:   none  PERTINENT RADIOLOGIC STUDIES: Dg Chest Port 1  View  11/20/2011  *RADIOLOGY REPORT*  Clinical Data: Cough, fever  PORTABLE CHEST - 1 VIEW  Comparison: 01/14/2011  Findings: Bibasilar atelectasis / scarring.  Lungs otherwise clear. No pleural effusion or pneumothorax.  Cardiomediastinal silhouette is within normal limits.  Cervical spine fixation hardware.  IMPRESSION: No evidence of acute cardiopulmonary disease.  Bibasilar atelectasis / scarring.  Original Report Authenticated By: Charline Bills, M.D.     PERTINENT LAB RESULTS: CBC:  Basename 11/21/11 0713 11/20/11 1704  WBC 6.1 8.9  HGB 12.6* 12.9*  HCT 36.6* 36.9*  PLT 177 206   CMET CMP     Component Value Date/Time   NA 134* 11/21/2011 0810   K 4.0 11/21/2011 0810   CL 104 11/21/2011 0810   CO2 22 11/21/2011 0810   GLUCOSE 97 11/21/2011 0810   BUN 13 11/21/2011 0810   CREATININE 1.13 11/21/2011 0810   CALCIUM 8.4 11/21/2011 0810   PROT 7.1 11/21/2011 0713   ALBUMIN 3.4* 11/21/2011 0713   AST 19 11/21/2011 0713   ALT 18 11/21/2011 0713   ALKPHOS 64 11/21/2011 0713   BILITOT 0.2* 11/21/2011 0713   GFRNONAA 69* 11/21/2011 0810   GFRAA 80* 11/21/2011 0810    GFR Estimated Creatinine Clearance: 93.4 ml/min (by C-G formula based on Cr of 1.13). No results found for this basename: LIPASE:2,AMYLASE:2 in the last 72 hours  Basename 11/21/11 1546 11/21/11 0811 11/20/11 2316  CKTOTAL 90 84 81  CKMB 2.1 2.2 1.8  CKMBINDEX -- -- --  TROPONINI <0.30 <0.30 <0.30   No components found with this basename: POCBNP:3 No results found for this basename: DDIMER:2 in the last 72 hours No results found for this basename: HGBA1C:2 in the last 72 hours No results found for this basename: CHOL:2,HDL:2,LDLCALC:2,TRIG:2,CHOLHDL:2,LDLDIRECT:2 in the last 72 hours No results found for this basename: TSH,T4TOTAL,FREET3,T3FREE,THYROIDAB in the last 72 hours No results found for this basename: VITAMINB12:2,FOLATE:2,FERRITIN:2,TIBC:2,IRON:2,RETICCTPCT:2 in the last 72 hours Coags: No  results found for this basename: PT:2,INR:2 in the last 72 hours Microbiology: Recent Results (from the past 240 hour(s))  CULTURE, BLOOD (ROUTINE X 2)     Status: Normal (Preliminary result)   Collection Time   11/20/11  5:07 PM      Component Value Range Status Comment   Specimen Description BLOOD HAND LEFT   Final    Special Requests BOTTLES DRAWN AEROBIC AND ANAEROBIC B 6C R 4CC   Final    Setup Time [id number]   Final    Culture     Final    Value:        BLOOD CULTURE RECEIVED NO GROWTH TO DATE CULTURE WILL BE HELD FOR 5 DAYS BEFORE ISSUING A FINAL NEGATIVE REPORT   Report Status PENDING   Incomplete   CULTURE, BLOOD (ROUTINE X 2)     Status: Normal (Preliminary result)   Collection Time   11/20/11  5:26 PM      Component Value Range Status Comment   Specimen Description BLOOD ARM RIGHT   Final    Special Requests BOTTLES DRAWN AEROBIC AND ANAEROBIC B 8CC, R 6CC   Final    Setup Time [id number]   Final    Culture     Final    Value:        BLOOD CULTURE RECEIVED NO GROWTH TO DATE CULTURE WILL BE HELD FOR 5 DAYS BEFORE ISSUING A FINAL NEGATIVE REPORT   Report Status PENDING   Incomplete   URINE CULTURE     Status: Normal   Collection Time   11/20/11  8:46 PM      Component Value Range Status Comment   Specimen Description URINE, CLEAN CATCH   Final    Special Requests NONE   Final    Setup Time [id number]   Final    Colony Count 5,000 COLONIES/ML   Final    Culture INSIGNIFICANT GROWTH   Final    Report Status 11/21/2011 FINAL   Final   MRSA PCR SCREENING     Status: Normal   Collection Time   11/20/11 11:29 PM      Component Value Range Status Comment   MRSA by PCR NEGATIVE  NEGATIVE  Final      BRIEF HOSPITAL COURSE:   Principal Problem:  *Hypotension -The patient's blood pressure is much improved with adequate hydration. We'll continue to hold usual antihypertensive medications for a few more days, patient's wife is a Charity fundraiser, she will check his BP and restart his  antihypertensives if SBP readings are higher than . Based on history obtained feel that syncopal symptoms were due to volume depletion and associated hypotension and not cardiac or neurologically mediated.  Active Problems:  HYPERTENSION, BENIGN - currently well controlled without need for further meds, anticipate that as patient continues to improve that he will need his meds restarted   Influenza -patient will complete a 5 day course of Tamiflu.   Rest of his medical issues were stable   TODAY-DAY OF DISCHARGE:  Subjective:   Shane Clark  today has no headache,no chest abdominal pain,no new weakness tingling or numbness, feels much better wants to go home today.   Objective:   Blood pressure 129/77, pulse 65, temperature 97.9 F (36.6 C), temperature source Oral, resp. rate 19, height 5\' 8"  (1.727 m), weight 135 kg (297 lb 9.9 oz), SpO2 93.00%.  Intake/Output Summary (Last 24 hours) at 11/22/11 1225 Last data filed at 11/22/11 0830  Gross per 24 hour  Intake   2200 ml  Output    801 ml  Net   1399 ml    Exam Awake Alert, Oriented *3, No new F.N deficits, Normal affect [place].AT,PERRAL Supple Neck,No JVD, No cervical lymphadenopathy appriciated.  Symmetrical Chest wall movement, Good air movement bilaterally, CTAB RRR,No Gallops,Rubs or new Murmurs, No Parasternal Heave +ve B.Sounds, Abd Soft, Non tender, No organomegaly appriciated, No rebound -guarding or rigidity. No Cyanosis, Clubbing or edema, No new Rash or bruise  DISPOSITION: Home in stable condition  DISCHARGE INSTRUCTIONS:    Follow-up Information    Follow up with EHINGER,ROBERT R. (patient has a previously scheduled appointment on 12/26 or 12/27)    Contact information:   [address] [employer], [place]. [place] [address] [phone]         Total Time spent on discharge equals 45 minutes.  SignedJeoffrey Massed 11/22/2011 12:25 PM

## 2011-11-26 NOTE — Progress Notes (Signed)
Retro ur ins review. 

## 2011-11-27 LAB — CULTURE, BLOOD (ROUTINE X 2)
Culture  Setup Time: 201212210155
Culture  Setup Time: 201212210155
Culture: NO GROWTH
Culture: NO GROWTH

## 2012-08-27 ENCOUNTER — Encounter: Payer: Self-pay | Admitting: Internal Medicine

## 2014-12-09 ENCOUNTER — Encounter: Payer: 59 | Attending: Family Medicine | Admitting: Dietician

## 2014-12-09 DIAGNOSIS — Z713 Dietary counseling and surveillance: Secondary | ICD-10-CM | POA: Diagnosis not present

## 2014-12-09 DIAGNOSIS — E669 Obesity, unspecified: Secondary | ICD-10-CM | POA: Insufficient documentation

## 2014-12-09 DIAGNOSIS — Z6841 Body Mass Index (BMI) 40.0 and over, adult: Secondary | ICD-10-CM | POA: Insufficient documentation

## 2014-12-09 NOTE — Progress Notes (Signed)
Patient was seen on 12/09/2014 for the Weight Loss Class at the [facility]. The following learning objectives were met by the patient during this class:   Describe healthy choices in each food group  Describe portion size of foods  Use plate method for meal planning  Demonstrate how to read Nutrition Facts food label  Set realistic goals for weight loss, diet changes, and physical activity.   Goals:  1. Make healthy food choices in each food group.  2. Reduce portion size of foods.  3. Increase fruit and vegetable intake.  4. Use plate method for meal planning.  5. Increase physical activity.    Handouts given:   1. Nutrition Strategies for Weight Loss   2. Meal plan/portion card   3. MyPlate Planner   4. Weight Management Recipe Resources   5. Bake, [place]il, [place]

## 2015-03-16 ENCOUNTER — Encounter: Payer: Self-pay | Admitting: Internal Medicine

## 2015-03-16 ENCOUNTER — Ambulatory Visit (INDEPENDENT_AMBULATORY_CARE_PROVIDER_SITE_OTHER): Payer: 59 | Admitting: Internal Medicine

## 2015-03-16 VITALS — BP 148/84 | HR 75 | Ht 68.0 in | Wt 300.4 lb

## 2015-03-16 DIAGNOSIS — R0602 Shortness of breath: Secondary | ICD-10-CM | POA: Diagnosis not present

## 2015-03-16 NOTE — Progress Notes (Signed)
Cardiology Office Note   Date:  03/16/2015   ID:  Shane Clark, DOB Apr 12, 1951, MRN [id number]  PCP:  Simona Huh, MD  Cardiologist:   Dorris Carnes, MD     History of Present Illness: Shane Clark is a 64 y.o. male with a history of chest pain. He has had several cardiac catheterizations with no evidence of CAD, last in 1998. Myoview scan in July 2007 showed normal perfusion. I saw him back in 2009  Patient returns for f/u Gets SOB with lying  Flat as well as walking   Worse than before   Wt up 25 for past 3 years CP intermitt  None lately  Dr Andrew Au PA attrib to COPD and reflux  Occurs with and without activity   NTG will  Help He denies reflux sympotms      Current Outpatient Prescriptions  Medication Sig Dispense Refill  . acetaminophen (TYLENOL) 500 MG tablet Take 1,000 mg by mouth every 6 (six) hours as needed. For pain      . albuterol (PROVENTIL HFA;VENTOLIN HFA) 108 (90 BASE) MCG/ACT inhaler Inhale 2 puffs into the lungs every 6 (six) hours as needed. Wheezing/shortness of breath     . aspirin EC 81 MG tablet Take 81 mg by mouth daily.      . budesonide-formoterol (SYMBICORT) 80-4.5 MCG/ACT inhaler Inhale 2 puffs into the lungs 2 (two) times daily.    . nitroGLYCERIN (NITROSTAT) 0.4 MG SL tablet Place 0.4 mg under the tongue every 5 (five) minutes as needed for chest pain.    . pantoprazole (PROTONIX) 40 MG tablet Take 40 mg by mouth daily.      . Tamsulosin HCl (FLOMAX) 0.4 MG CAPS Take 0.4 mg by mouth daily.       No current facility-administered medications for this visit.    Allergies:   Oxycodone-acetaminophen; Advair diskus; and Pentazocine lactate   Past Medical History  Diagnosis Date  . Hypertension   . COPD (chronic obstructive pulmonary disease)   . BPH (benign prostatic hyperplasia)     Past Surgical History  Procedure Laterality Date  . Appendectomy    . Hernia repair    . Back surgery    . Knee surgery    . Surgery for small  bowel obstruction       Social History:  The patient  reports that he has quit smoking. He does not have any smokeless tobacco history on file. He reports that he does not drink alcohol.   Family History:  The patient's family history includes Coronary artery disease in his father; Diabetes type II in his other; Pancreatic cancer in his brother; Stroke in his brother.    ROS:  Please see the history of present illness. All other systems are reviewed and  Negative to the above problem except as noted.    PHYSICAL EXAM: VS:  BP 148/84 mmHg  Pulse 75  Ht 5\' 8"  (1.727 m)  Wt 300 lb 6.4 oz (136.261 kg)  BMI 45.69 kg/m2  GEN: Morbidly obese 64 yo , in no acute distress HEENT: normal Neck: no JVD, carotid bruits, or masses Cardiac: RRR; no murmurs, rubs, or gallops,no edema  Respiratory:  clear to auscultation bilaterally, normal work of breathing GI: soft, nontender, nondistended, + BS  No hepatomegaly  MS: no deformity Moving all extremities   Skin: warm and dry, no rash Neuro:  Strength and sensation are intact Psych: euthymic mood, full affect   EKG:  EKG is ordered  today.  SR 76 bpm  NOnspeciffic ST T wave changes     Lipid Panel    Component Value Date/Time   CHOL 111 03/29/2008 1401   TRIG 62 03/29/2008 1401   HDL 22.9* 03/29/2008 1401   CHOLHDL 4.8 CALC 03/29/2008 1401   VLDL 12 03/29/2008 1401   LDLCALC 76 03/29/2008 1401      Wt Readings from Last 3 Encounters:  03/16/15 300 lb 6.4 oz (136.261 kg)  11/21/11 297 lb 9.9 oz (135 kg)  12/09/10 301 lb (136.533 kg)      ASSESSMENT AND PLAN:  1  Dypsnea and chest pain.  Patinet has been troubled by theses symptoms for several years.  He says they are getting worse  He is morbidly obese    I would set up for echo as well as lexiscan myoview  2.  Morbid obesity  Needs to lose wt  Discussed with patient who says he understands.    Signed, Dorris Carnes, MD  03/16/2015 4:38 PM    [address], [place], [place]  [address] Phone: [phone]; Fax: [phone]

## 2015-03-16 NOTE — Patient Instructions (Signed)
Medication Instructions:  Your physician recommends that you continue on your current medications as directed. Please refer to the Current Medication list given to you today.   Labwork: NONE  Testing/Procedures: Your physician has requested that you have an echocardiogram. Echocardiography is a painless test that uses sound waves to create images of your heart. It provides your doctor with information about the size and shape of your heart and how well your heart's chambers and valves are working. This procedure takes approximately one hour. There are no restrictions for this procedure.  Your physician has requested that you have a lexiscan myoview. For further information please visit [email or web address]. Please follow instruction sheet, as given.   Follow-Up: Follow up with your physician will depend on test results.

## 2015-03-23 ENCOUNTER — Encounter: Payer: Self-pay | Admitting: Internal Medicine

## 2015-03-28 ENCOUNTER — Telehealth (HOSPITAL_COMMUNITY): Payer: Self-pay

## 2015-03-28 NOTE — Telephone Encounter (Signed)
Patient given detailed instructions per Myocardial Perfusion Study Information Sheet for test on 04-28-2016at 12:30pm. Patient verbalized understanding. Irven Baltimore, RN.

## 2015-03-29 ENCOUNTER — Ambulatory Visit (HOSPITAL_COMMUNITY): Payer: 59 | Attending: Internal Medicine

## 2015-03-29 ENCOUNTER — Ambulatory Visit (HOSPITAL_BASED_OUTPATIENT_CLINIC_OR_DEPARTMENT_OTHER): Payer: 59 | Admitting: Radiology

## 2015-03-29 ENCOUNTER — Other Ambulatory Visit (HOSPITAL_COMMUNITY): Payer: 59 | Admitting: Internal Medicine

## 2015-03-29 DIAGNOSIS — R0602 Shortness of breath: Secondary | ICD-10-CM | POA: Diagnosis present

## 2015-03-29 DIAGNOSIS — R0600 Dyspnea, unspecified: Secondary | ICD-10-CM | POA: Diagnosis not present

## 2015-03-29 MED ORDER — TECHNETIUM TC 99M SESTAMIBI GENERIC - CARDIOLITE
33.0000 | Freq: Once | INTRAVENOUS | Status: AC | PRN
  Administered 2015-03-29: 33 via INTRAVENOUS

## 2015-03-29 MED ORDER — REGADENOSON 0.4 MG/5ML IV SOLN
0.4000 mg | Freq: Once | INTRAVENOUS | Status: AC
  Administered 2015-03-29: 0.4 mg via INTRAVENOUS

## 2015-03-29 MED ORDER — PERFLUTREN LIPID MICROSPHERE
1.0000 mL | Freq: Once | INTRAVENOUS | Status: AC
  Administered 2015-03-29: 1 mL via INTRAVENOUS

## 2015-03-29 NOTE — Progress Notes (Signed)
[facility] [address] [place], [place] [address] [phone]    Cardiology Nuclear Med Study  Shane Clark is a 64 y.o. male     MRN : [id number]     DOB: [birth year]  Procedure Date: 03/29/2015  Nuclear Med Background Indication for Stress Test:  Evaluation for Ischemia and Abnormal EKG History:  COPD and 2007 MPI:  Nl  Cardiac Risk Factors: Carotid Disease and Hypertension  Symptoms:  Chest Pain, DOE and SOB   Nuclear Pre-Procedure Caffeine/Decaff Intake:  None NPO After: 8:00pm   Lungs:  clear O2 Sat: 95% on room air. IV 0.9% NS with Angio Cath:  22g  IV Site: L Hand  IV Started by:  Crissie Figures, RN  Chest Size (in):  50+ Cup Size: n/a  Height: 5\' 8"  (1.727 m)  Weight:  300 lb (136.079 kg)  BMI:  Body mass index is 45.63 kg/(m^2). Tech Comments:  N/A    Nuclear Med Study 1 or 2 day study: 2 day  Stress Test Type:  Carlton Adam  Reading MD: N/A  Order Authorizing Provider:  Dorris Carnes, MD  Resting Radionuclide: Technetium 18m Sestamibi  Resting Radionuclide Dose: 33.77mCi on  03/30/15  Stress Radionuclide:  Technetium 83m Sestamibi  Stress Radionuclide Dose: 33.0 mCi on 03/29/15           Stress Protocol Rest HR: 64 Stress HR: 82  Rest BP: 140/77 Stress BP: 135/81  Exercise Time (min): n/a METS: n/a   Predicted Max HR: 157 bpm % Max HR: 52.23 bpm Rate Pressure Product: 11480   Dose of Adenosine (mg):  n/a Dose of Lexiscan: 0.4 mg  Dose of Atropine (mg): n/a Dose of Dobutamine: n/a mcg/kg/min (at max HR)  Stress Test Technologist: Perrin Maltese, EMT-P  Nuclear Technologist:  Earl Many, CNMT     Rest Procedure:  Myocardial perfusion imaging was performed at rest 45 minutes following the intravenous administration of Technetium 34m Sestamibi. Rest ECG: NSR 65 bpm   Stress Procedure:  The patient received IV Lexiscan 0.4 mg over 15-seconds.  Technetium 59m Sestamibi injected at 30-seconds. This patient felt weird and was so  with the Lexiscan injection. Quantitative spect images were obtained after a 45 minute delay. Stress ECG: No significant change from baseline ECG  QPS Raw Data Images:  Soft tissue (diaphragm) underlies heart.   Stress Images:  Normal homogeneous uptake in all areas of the myocardium. Rest Images:  APical thinning  Otherwise normal perfusion   Subtraction (SDS):  No evidence of ischemia. Transient Ischemic Dilatation (Normal <1.22):  0.88 Lung/Heart Ratio (Normal <0.45):  0.43  Quantitative Gated Spect Images QGS EDV:  103 ml QGS ESV:  37 ml  Impression Exercise Capacity:  Lexiscan with no exercise. BP Response:  Normal blood pressure response. Clinical Symptoms:  No chest pain. ECG Impression:  No significant ST segment change suggestive of ischemia. Comparison with Prior Nuclear Study: No significant change from previous study  Overall Impression:  Normal stress nuclear study.  LV Ejection Fraction: 64%.  LV Wall Motion:  NL LV Function; NL Wall Motion   Dorris Carnes

## 2015-03-29 NOTE — Progress Notes (Signed)
2D Echo with Definity completed. 03/29/2015

## 2015-03-30 ENCOUNTER — Ambulatory Visit (HOSPITAL_COMMUNITY): Payer: 59 | Attending: Cardiovascular Disease

## 2015-03-30 MED ORDER — TECHNETIUM TC 99M SESTAMIBI GENERIC - CARDIOLITE
30.0000 | Freq: Once | INTRAVENOUS | Status: AC | PRN
  Administered 2015-03-30: 30 via INTRAVENOUS

## 2015-04-04 LAB — MYOCARDIAL PERFUSION IMAGING
CHL CUP STRESS STAGE 1 DBP: 77 mmHg
CHL CUP STRESS STAGE 1 GRADE: 0 %
CHL CUP STRESS STAGE 1 SBP: 140 mmHg
CHL CUP STRESS STAGE 1 SPEED: 0 mph
CHL CUP STRESS STAGE 2 GRADE: 0 %
CHL CUP STRESS STAGE 2 SPEED: 0 mph
CHL CUP STRESS STAGE 3 GRADE: 0 %
CHL CUP STRESS STAGE 4 HR: 82 {beats}/min
CHL CUP STRESS STAGE 5 GRADE: 0 %
CHL CUP STRESS STAGE 5 SBP: 135 mmHg
CHL CUP STRESS STAGE 5 SPEED: 0 mph
CHL CUP STRESS STAGE 6 GRADE: 0 %
CHL CUP STRESS STAGE 6 HR: 67 {beats}/min
CSEPEW: 1 METS
CSEPPHR: 82 {beats}/min
Percent of predicted max HR: 52 %
Stage 1 HR: 61 {beats}/min
Stage 2 HR: 61 {beats}/min
Stage 3 DBP: 86 mmHg
Stage 3 HR: 71 {beats}/min
Stage 3 SBP: 126 mmHg
Stage 3 Speed: 0 mph
Stage 4 Grade: 0 %
Stage 4 Speed: 0 mph
Stage 5 DBP: 81 mmHg
Stage 5 HR: 75 {beats}/min
Stage 6 DBP: 76 mmHg
Stage 6 SBP: 128 mmHg
Stage 6 Speed: 0 mph

## 2015-04-05 ENCOUNTER — Telehealth: Payer: Self-pay | Admitting: *Deleted

## 2015-04-05 ENCOUNTER — Other Ambulatory Visit: Payer: Self-pay | Admitting: *Deleted

## 2015-04-05 NOTE — Telephone Encounter (Signed)
Follow up     Returning call back to nurse from today

## 2015-04-05 NOTE — Telephone Encounter (Signed)
Message     Echo shows normal pumping function of the heart. Minimal relaxation dysfunction I do not think it explains his SOB     Stress test is normal SOB does not appear to be due to blood supply problem to heart        LEFT MESSAGE FOR PATIENT TO CALL BACK.

## 2015-04-05 NOTE — Telephone Encounter (Signed)
Informed patient. He said PCP-NP felt his SOB may be related to COPD, he does not have pulmonary seeing him. Suggested he follow up with PCP, will forward results to Dr. Marisue Humble. Put in for recall in 1 year.

## 2015-12-05 DIAGNOSIS — L940 Localized scleroderma [morphea]: Secondary | ICD-10-CM | POA: Diagnosis not present

## 2015-12-05 DIAGNOSIS — L570 Actinic keratosis: Secondary | ICD-10-CM | POA: Diagnosis not present

## 2015-12-05 DIAGNOSIS — D225 Melanocytic nevi of trunk: Secondary | ICD-10-CM | POA: Diagnosis not present

## 2015-12-05 DIAGNOSIS — X32XXXA Exposure to sunlight, initial encounter: Secondary | ICD-10-CM | POA: Diagnosis not present

## 2015-12-17 MED FILL — SYMBICORT 80-4.5 MCG INH: 80-4.5 | 90 days supply | Qty: 31 | Fill #0

## 2015-12-18 DIAGNOSIS — L940 Localized scleroderma [morphea]: Secondary | ICD-10-CM | POA: Diagnosis not present

## 2015-12-18 MED FILL — CLOBETASOL 0.05% CREAM: 0.05 | 20 days supply | Qty: 60 | Fill #0

## 2016-01-02 MED FILL — TAMSULOSIN HCL 0.4 MG CAP: 0.4 | 90 days supply | Qty: 90 | Fill #0

## 2016-01-02 MED FILL — PANTOPRAZOLE SOD DR 40 MG T: 40 | 90 days supply | Qty: 90 | Fill #0

## 2016-01-03 DIAGNOSIS — Z1211 Encounter for screening for malignant neoplasm of colon: Secondary | ICD-10-CM | POA: Diagnosis not present

## 2016-01-15 ENCOUNTER — Telehealth: Payer: Self-pay | Admitting: Internal Medicine

## 2016-01-15 NOTE — Telephone Encounter (Signed)
patient is due for recall colon with Dr. Carlean Purl. Received records from Dr. Paulita Fujita office. Patient was seen there on 01/03/16. Patient wants to stay within [facility]. Records placed on Dr. Celesta Aver desk for review.

## 2016-01-16 ENCOUNTER — Encounter: Payer: Self-pay | Admitting: Internal Medicine

## 2016-01-17 MED FILL — PROMETHAZINE 25 MG TABLET: 25 | 3 days supply | Qty: 15 | Fill #0

## 2016-02-05 ENCOUNTER — Emergency Department (HOSPITAL_COMMUNITY): Payer: 59

## 2016-02-05 ENCOUNTER — Encounter (HOSPITAL_COMMUNITY): Payer: Self-pay | Admitting: Emergency Medicine

## 2016-02-05 ENCOUNTER — Inpatient Hospital Stay (HOSPITAL_COMMUNITY)
Admission: EM | Admit: 2016-02-05 | Discharge: 2016-02-07 | DRG: 871 | Disposition: A | Discharge status: Home / Self Care | Payer: 59 | Attending: Internal Medicine | Admitting: Internal Medicine

## 2016-02-05 DIAGNOSIS — B349 Viral infection, unspecified: Secondary | ICD-10-CM | POA: Diagnosis present

## 2016-02-05 DIAGNOSIS — R059 Cough, unspecified: Secondary | ICD-10-CM | POA: Diagnosis present

## 2016-02-05 DIAGNOSIS — Z823 Family history of stroke: Secondary | ICD-10-CM

## 2016-02-05 DIAGNOSIS — R519 Headache, unspecified: Secondary | ICD-10-CM

## 2016-02-05 DIAGNOSIS — N179 Acute kidney failure, unspecified: Secondary | ICD-10-CM | POA: Diagnosis present

## 2016-02-05 DIAGNOSIS — Z833 Family history of diabetes mellitus: Secondary | ICD-10-CM | POA: Diagnosis not present

## 2016-02-05 DIAGNOSIS — Z87891 Personal history of nicotine dependence: Secondary | ICD-10-CM

## 2016-02-05 DIAGNOSIS — Z23 Encounter for immunization: Secondary | ICD-10-CM | POA: Diagnosis not present

## 2016-02-05 DIAGNOSIS — Z800 Family history of malignant neoplasm of digestive organs: Secondary | ICD-10-CM | POA: Diagnosis not present

## 2016-02-05 DIAGNOSIS — R7881 Bacteremia: Secondary | ICD-10-CM | POA: Diagnosis not present

## 2016-02-05 DIAGNOSIS — Z8249 Family history of ischemic heart disease and other diseases of the circulatory system: Secondary | ICD-10-CM | POA: Diagnosis not present

## 2016-02-05 DIAGNOSIS — I10 Essential (primary) hypertension: Secondary | ICD-10-CM

## 2016-02-05 DIAGNOSIS — J9811 Atelectasis: Secondary | ICD-10-CM | POA: Diagnosis not present

## 2016-02-05 DIAGNOSIS — A419 Sepsis, unspecified organism: Principal | ICD-10-CM | POA: Diagnosis present

## 2016-02-05 DIAGNOSIS — R0602 Shortness of breath: Secondary | ICD-10-CM | POA: Diagnosis not present

## 2016-02-05 DIAGNOSIS — J441 Chronic obstructive pulmonary disease with (acute) exacerbation: Secondary | ICD-10-CM

## 2016-02-05 DIAGNOSIS — Z7982 Long term (current) use of aspirin: Secondary | ICD-10-CM | POA: Diagnosis not present

## 2016-02-05 DIAGNOSIS — R4182 Altered mental status, unspecified: Secondary | ICD-10-CM | POA: Diagnosis not present

## 2016-02-05 DIAGNOSIS — N400 Enlarged prostate without lower urinary tract symptoms: Secondary | ICD-10-CM | POA: Diagnosis present

## 2016-02-05 DIAGNOSIS — R05 Cough: Secondary | ICD-10-CM

## 2016-02-05 DIAGNOSIS — I959 Hypotension, unspecified: Secondary | ICD-10-CM

## 2016-02-05 DIAGNOSIS — R51 Headache: Secondary | ICD-10-CM

## 2016-02-05 DIAGNOSIS — G9340 Encephalopathy, unspecified: Secondary | ICD-10-CM

## 2016-02-05 DIAGNOSIS — E872 Acidosis: Secondary | ICD-10-CM | POA: Diagnosis present

## 2016-02-05 DIAGNOSIS — R069 Unspecified abnormalities of breathing: Secondary | ICD-10-CM | POA: Diagnosis not present

## 2016-02-05 DIAGNOSIS — E875 Hyperkalemia: Secondary | ICD-10-CM | POA: Diagnosis present

## 2016-02-05 HISTORY — DX: Sepsis, unspecified organism: A41.9

## 2016-02-05 HISTORY — DX: Gastro-esophageal reflux disease without esophagitis: K21.9

## 2016-02-05 HISTORY — DX: Other chronic pain: G89.29

## 2016-02-05 HISTORY — DX: Dorsalgia, unspecified: M54.9

## 2016-02-05 LAB — CBC WITH DIFFERENTIAL/PLATELET
BASOS ABS: 0 10*3/uL (ref 0.0–0.1)
BASOS PCT: 0 %
EOS ABS: 0.1 10*3/uL (ref 0.0–0.7)
Eosinophils Relative: 2 %
HEMATOCRIT: 40.4 % (ref 39.0–52.0)
HEMOGLOBIN: 13.7 g/dL (ref 13.0–17.0)
Lymphocytes Relative: 20 %
Lymphs Abs: 1.4 10*3/uL (ref 0.7–4.0)
MCH: 31.1 pg (ref 26.0–34.0)
MCHC: 33.9 g/dL (ref 30.0–36.0)
MCV: 91.8 fL (ref 78.0–100.0)
MONOS PCT: 19 %
Monocytes Absolute: 1.3 10*3/uL — ABNORMAL HIGH (ref 0.1–1.0)
NEUTROS ABS: 4 10*3/uL (ref 1.7–7.7)
NEUTROS PCT: 59 %
Platelets: 235 10*3/uL (ref 150–400)
RBC: 4.4 MIL/uL (ref 4.22–5.81)
RDW: 13.3 % (ref 11.5–15.5)
WBC: 6.8 10*3/uL (ref 4.0–10.5)

## 2016-02-05 LAB — ACETAMINOPHEN LEVEL: Acetaminophen (Tylenol), Serum: <10 ug/mL — ABNORMAL LOW (ref 10–30)

## 2016-02-05 LAB — COMPREHENSIVE METABOLIC PANEL
ALBUMIN: 3.1 g/dL — AB (ref 3.5–5.0)
ALK PHOS: 68 U/L (ref 38–126)
ALT: 23 U/L (ref 17–63)
ANION GAP: 11 (ref 5–15)
AST: 27 U/L (ref 15–41)
BUN: 14 mg/dL (ref 6–20)
CHLORIDE: 104 mmol/L (ref 101–111)
CO2: 21 mmol/L — AB (ref 22–32)
Calcium: 8.1 mg/dL — ABNORMAL LOW (ref 8.9–10.3)
Creatinine, Ser: 1.3 mg/dL — ABNORMAL HIGH (ref 0.61–1.24)
GFR calc Af Amer: >60 mL/min (ref >60)
GFR calc non Af Amer: 56 mL/min — ABNORMAL LOW (ref >60)
GLUCOSE: 94 mg/dL (ref 65–99)
Potassium: 4.5 mmol/L (ref 3.5–5.1)
SODIUM: 136 mmol/L (ref 135–145)
TOTAL PROTEIN: 6.3 g/dL — AB (ref 6.5–8.1)
Total Bilirubin: 0.8 mg/dL (ref 0.3–1.2)

## 2016-02-05 LAB — I-STAT CHEM 8, ED
BUN: 23 mg/dL — ABNORMAL HIGH (ref 6–20)
CHLORIDE: 101 mmol/L (ref 101–111)
Calcium, Ion: 1.05 mmol/L — ABNORMAL LOW (ref 1.13–1.30)
Creatinine, Ser: 1.3 mg/dL — ABNORMAL HIGH (ref 0.61–1.24)
Glucose, Bld: 106 mg/dL — ABNORMAL HIGH (ref 65–99)
HEMATOCRIT: 44 % (ref 39.0–52.0)
HEMOGLOBIN: 15 g/dL (ref 13.0–17.0)
POTASSIUM: 4.9 mmol/L (ref 3.5–5.1)
SODIUM: 135 mmol/L (ref 135–145)
TCO2: 24 mmol/L (ref 0–100)

## 2016-02-05 LAB — ETHANOL: Alcohol, Ethyl (B): <5 mg/dL (ref <5)

## 2016-02-05 LAB — CBC
HEMATOCRIT: 39.1 % (ref 39.0–52.0)
HEMOGLOBIN: 13.1 g/dL (ref 13.0–17.0)
MCH: 31.1 pg (ref 26.0–34.0)
MCHC: 33.5 g/dL (ref 30.0–36.0)
MCV: 92.9 fL (ref 78.0–100.0)
Platelets: 177 10*3/uL (ref 150–400)
RBC: 4.21 MIL/uL — AB (ref 4.22–5.81)
RDW: 13.4 % (ref 11.5–15.5)
WBC: 3.9 10*3/uL — ABNORMAL LOW (ref 4.0–10.5)

## 2016-02-05 LAB — RAPID URINE DRUG SCREEN, HOSP PERFORMED
AMPHETAMINES: NOT DETECTED
BENZODIAZEPINES: NOT DETECTED
Barbiturates: NOT DETECTED
Cocaine: NOT DETECTED
OPIATES: NOT DETECTED
Tetrahydrocannabinol: NOT DETECTED

## 2016-02-05 LAB — BRAIN NATRIURETIC PEPTIDE: B NATRIURETIC PEPTIDE 5: 8.7 pg/mL (ref 0.0–100.0)

## 2016-02-05 LAB — MRSA PCR SCREENING: MRSA by PCR: NEGATIVE

## 2016-02-05 LAB — MAGNESIUM
MAGNESIUM: 1.5 mg/dL — AB (ref 1.7–2.4)
MAGNESIUM: 1.6 mg/dL — AB (ref 1.7–2.4)

## 2016-02-05 LAB — SALICYLATE LEVEL: Salicylate Lvl: <4 mg/dL (ref 2.8–30.0)

## 2016-02-05 LAB — CREATININE, SERUM
CREATININE: 1.25 mg/dL — AB (ref 0.61–1.24)
GFR calc Af Amer: >60 mL/min (ref >60)
GFR calc non Af Amer: 59 mL/min — ABNORMAL LOW (ref >60)

## 2016-02-05 LAB — I-STAT VENOUS BLOOD GAS, ED
Acid-Base Excess: 2 mmol/L (ref 0.0–2.0)
BICARBONATE: 25.5 meq/L — AB (ref 20.0–24.0)
O2 Saturation: 92 %
PCO2 VEN: 34.3 mmHg — AB (ref 45.0–50.0)
PH VEN: 7.48 — AB (ref 7.250–7.300)
PO2 VEN: 58 mmHg — AB (ref 31.0–45.0)
TCO2: 27 mmol/L (ref 0–100)

## 2016-02-05 LAB — URINALYSIS, ROUTINE W REFLEX MICROSCOPIC
BILIRUBIN URINE: NEGATIVE
Glucose, UA: NEGATIVE mg/dL
Hgb urine dipstick: NEGATIVE
KETONES UR: NEGATIVE mg/dL
LEUKOCYTES UA: NEGATIVE
NITRITE: NEGATIVE
Protein, ur: NEGATIVE mg/dL
SPECIFIC GRAVITY, URINE: 1.012 (ref 1.005–1.030)
pH: 6.5 (ref 5.0–8.0)

## 2016-02-05 LAB — I-STAT TROPONIN, ED: TROPONIN I, POC: 0.01 ng/mL (ref 0.00–0.08)

## 2016-02-05 LAB — I-STAT CG4 LACTIC ACID, ED: Lactic Acid, Venous: 2.54 mmol/L (ref 0.5–2.0)

## 2016-02-05 LAB — INFLUENZA PANEL BY PCR (TYPE A & B)
H1N1 flu by pcr: NOT DETECTED
INFLBPCR: NEGATIVE
Influenza A By PCR: NEGATIVE

## 2016-02-05 LAB — LIPASE, BLOOD: Lipase: 28 U/L (ref 11–51)

## 2016-02-05 LAB — PHOSPHORUS: PHOSPHORUS: 2.6 mg/dL (ref 2.5–4.6)

## 2016-02-05 LAB — CBG MONITORING, ED: Glucose-Capillary: 99 mg/dL (ref 65–99)

## 2016-02-05 MED ORDER — CETYLPYRIDINIUM CHLORIDE 0.05 % MT LIQD
7.0000 mL | Freq: Two times a day (BID) | OROMUCOSAL | Status: DC
  Administered 2016-02-06 (×2): 7 mL via OROMUCOSAL

## 2016-02-05 MED ORDER — SODIUM CHLORIDE 0.9 % IV SOLN
Freq: Once | INTRAVENOUS | Status: AC
  Administered 2016-02-05: 11:00:00 via INTRAVENOUS

## 2016-02-05 MED ORDER — VANCOMYCIN HCL IN DEXTROSE 1-5 GM/200ML-% IV SOLN
1000.0000 mg | Freq: Once | INTRAVENOUS | Status: DC
  Filled 2016-02-05: qty 200

## 2016-02-05 MED ORDER — METHYLPREDNISOLONE SODIUM SUCC 125 MG IJ SOLR
80.0000 mg | Freq: Once | INTRAMUSCULAR | Status: AC
  Administered 2016-02-05: 80 mg via INTRAVENOUS
  Filled 2016-02-05: qty 2

## 2016-02-05 MED ORDER — VANCOMYCIN HCL 10 G IV SOLR
2000.0000 mg | Freq: Once | INTRAVENOUS | Status: AC
  Administered 2016-02-05: 2000 mg via INTRAVENOUS
  Filled 2016-02-05: qty 2000

## 2016-02-05 MED ORDER — METHYLPREDNISOLONE SODIUM SUCC 40 MG IJ SOLR
40.0000 mg | Freq: Two times a day (BID) | INTRAMUSCULAR | Status: DC
  Administered 2016-02-05 – 2016-02-06 (×2): 40 mg via INTRAVENOUS
  Filled 2016-02-05 (×2): qty 1

## 2016-02-05 MED ORDER — ONDANSETRON HCL 4 MG/2ML IJ SOLN: 4.0000 mg | Freq: Four times a day (QID) | INTRAMUSCULAR | Status: DC | PRN

## 2016-02-05 MED ORDER — DOXYCYCLINE HYCLATE 100 MG IV SOLR
100.0000 mg | Freq: Two times a day (BID) | INTRAVENOUS | Status: DC
  Administered 2016-02-05 – 2016-02-06 (×2): 100 mg via INTRAVENOUS
  Filled 2016-02-05 (×3): qty 100

## 2016-02-05 MED ORDER — PIPERACILLIN-TAZOBACTAM 3.375 G IVPB
3.3750 g | Freq: Three times a day (TID) | INTRAVENOUS | Status: DC
  Filled 2016-02-05 (×3): qty 50

## 2016-02-05 MED ORDER — HEPARIN SODIUM (PORCINE) 5000 UNIT/ML IJ SOLN
5000.0000 [IU] | Freq: Three times a day (TID) | INTRAMUSCULAR | Status: DC
  Administered 2016-02-05 – 2016-02-07 (×5): 5000 [IU] via SUBCUTANEOUS
  Filled 2016-02-05 (×5): qty 1

## 2016-02-05 MED ORDER — ALBUTEROL SULFATE (2.5 MG/3ML) 0.083% IN NEBU: 2.5000 mg | INHALATION_SOLUTION | RESPIRATORY_TRACT | Status: DC | PRN

## 2016-02-05 MED ORDER — PANTOPRAZOLE SODIUM 40 MG PO TBEC
40.0000 mg | DELAYED_RELEASE_TABLET | Freq: Every day | ORAL | Status: DC
  Administered 2016-02-06 – 2016-02-07 (×2): 40 mg via ORAL
  Filled 2016-02-05 (×2): qty 1

## 2016-02-05 MED ORDER — TIOTROPIUM BROMIDE MONOHYDRATE 18 MCG IN CAPS
18.0000 ug | ORAL_CAPSULE | Freq: Every day | RESPIRATORY_TRACT | Status: DC
  Administered 2016-02-06 – 2016-02-07 (×2): 18 ug via RESPIRATORY_TRACT
  Filled 2016-02-05: qty 5

## 2016-02-05 MED ORDER — PIPERACILLIN-TAZOBACTAM 3.375 G IVPB 30 MIN
3.3750 g | Freq: Once | INTRAVENOUS | Status: AC
  Administered 2016-02-05: 3.375 g via INTRAVENOUS
  Filled 2016-02-05: qty 50

## 2016-02-05 MED ORDER — VANCOMYCIN HCL 10 G IV SOLR: 1750.0000 mg | INTRAVENOUS | Status: DC

## 2016-02-05 MED ORDER — SODIUM CHLORIDE 0.9 % IV SOLN
INTRAVENOUS | Status: DC
  Administered 2016-02-05: 75 mL/h via INTRAVENOUS
  Administered 2016-02-06 – 2016-02-07 (×2): via INTRAVENOUS

## 2016-02-05 MED ORDER — ALBUTEROL SULFATE (2.5 MG/3ML) 0.083% IN NEBU: 3.0000 mL | INHALATION_SOLUTION | Freq: Four times a day (QID) | RESPIRATORY_TRACT | Status: DC | PRN

## 2016-02-05 MED ORDER — NITROGLYCERIN 0.4 MG SL SUBL: 0.4000 mg | SUBLINGUAL_TABLET | SUBLINGUAL | Status: DC | PRN

## 2016-02-05 MED ORDER — TAMSULOSIN HCL 0.4 MG PO CAPS
0.4000 mg | ORAL_CAPSULE | Freq: Every day | ORAL | Status: DC
  Administered 2016-02-05 – 2016-02-07 (×3): 0.4 mg via ORAL
  Filled 2016-02-05 (×3): qty 1

## 2016-02-05 MED ORDER — ONDANSETRON HCL 4 MG PO TABS: 4.0000 mg | ORAL_TABLET | Freq: Four times a day (QID) | ORAL | Status: DC | PRN

## 2016-02-05 MED ORDER — SODIUM CHLORIDE 0.9 % IV BOLUS (SEPSIS)
500.0000 mL | INTRAVENOUS | Status: AC
  Administered 2016-02-05: 500 mL via INTRAVENOUS

## 2016-02-05 MED ORDER — ASPIRIN EC 81 MG PO TBEC
81.0000 mg | DELAYED_RELEASE_TABLET | Freq: Every day | ORAL | Status: DC
  Administered 2016-02-06 – 2016-02-07 (×2): 81 mg via ORAL
  Filled 2016-02-05 (×2): qty 1

## 2016-02-05 MED ORDER — POLYETHYLENE GLYCOL 3350 17 G PO PACK: 17.0000 g | PACK | Freq: Every day | ORAL | Status: DC | PRN

## 2016-02-05 MED ORDER — ALBUTEROL SULFATE (2.5 MG/3ML) 0.083% IN NEBU
2.5000 mg | INHALATION_SOLUTION | Freq: Four times a day (QID) | RESPIRATORY_TRACT | Status: DC
  Administered 2016-02-05: 2.5 mg via RESPIRATORY_TRACT
  Filled 2016-02-05: qty 3

## 2016-02-05 MED ORDER — ACETAMINOPHEN 650 MG RE SUPP: 650.0000 mg | Freq: Four times a day (QID) | RECTAL | Status: DC | PRN

## 2016-02-05 MED ORDER — ACETAMINOPHEN 325 MG PO TABS: 650.0000 mg | ORAL_TABLET | Freq: Four times a day (QID) | ORAL | Status: DC | PRN

## 2016-02-05 MED ORDER — PROCHLORPERAZINE EDISYLATE 5 MG/ML IJ SOLN
10.0000 mg | Freq: Four times a day (QID) | INTRAMUSCULAR | Status: DC | PRN
  Administered 2016-02-05: 10 mg via INTRAVENOUS
  Filled 2016-02-05 (×2): qty 2

## 2016-02-05 MED ORDER — SODIUM CHLORIDE 0.9 % IV BOLUS (SEPSIS)
1000.0000 mL | INTRAVENOUS | Status: AC
  Administered 2016-02-05 (×2): 1000 mL via INTRAVENOUS

## 2016-02-05 MED ORDER — MOMETASONE FURO-FORMOTEROL FUM 100-5 MCG/ACT IN AERO
2.0000 | INHALATION_SPRAY | Freq: Two times a day (BID) | RESPIRATORY_TRACT | Status: DC
  Administered 2016-02-05 – 2016-02-07 (×4): 2 via RESPIRATORY_TRACT
  Filled 2016-02-05: qty 8.8

## 2016-02-05 MED ORDER — DIPHENHYDRAMINE HCL 50 MG/ML IJ SOLN
25.0000 mg | Freq: Once | INTRAMUSCULAR | Status: AC
  Administered 2016-02-05: 25 mg via INTRAVENOUS
  Filled 2016-02-05: qty 1

## 2016-02-05 NOTE — ED Notes (Signed)
Pt arrives via [facility], ems reports they were called out for sob, pt reports sob x 2 days upon arrival, pt was given 5mg  albuterol by fire. [facility]ms reports pt started c/o severed headache 6/10, pt became altered upon arrival to ED. Pt would not answer questions or follow commands.

## 2016-02-05 NOTE — ED Notes (Signed)
Lab called stating that cmp, lipase, magnesium and phosphorous need to be reordered/redrawn because the tube was hemolyzed

## 2016-02-05 NOTE — Progress Notes (Signed)
Report received from [facility] for admission to [phone]

## 2016-02-05 NOTE — ED Notes (Signed)
Dr. Schlossman at bedside. 

## 2016-02-05 NOTE — ED Notes (Signed)
Patients family at bedside, pts family reporting patient took 39ml of nyquil pta. MD made aware

## 2016-02-05 NOTE — ED Provider Notes (Signed)
CSN: [id number]     Arrival date & time 02/05/16  1054 History   First MD Initiated Contact with Patient 02/05/16 1111     Chief Complaint  Patient presents with  . Shortness of Breath  . Altered Mental Status     (Consider location/radiation/quality/duration/timing/severity/associated sxs/prior Treatment) HPI Comments:  65 year old male with a history of COPD, hypertension, BPH presents with concern for shortness of breath, headache, and episode of altered mental status. EMS was initially called to patient's residence secondary to dyspnea, cough for 2 days, and fever home. Patient subsequently developed a headache. Blood pressures were initially within normal limits, and neurologic exam was nonfocal with EMS, however in transport, the patient's blood pressures began to drop, and on arrival to the emergency department, patient's blood pressure over 0000000 systolic, and patient appeared altered, slow to respond.  Patient reports frontal headache peaking within minutes, 8/10. No associated symptoms of numnbess/weakness.   Cough productive over the last 2 days. Shortness of breath developing today.  Fever yesterday.    Past Medical History  Diagnosis Date  . Hypertension   . COPD (chronic obstructive pulmonary disease) ([place])   . BPH (benign prostatic hyperplasia)   . GERD (gastroesophageal reflux disease)   . Chronic back pain     "all over"   Past Surgical History  Procedure Laterality Date  . Back surgery    . Knee arthroscopy Left   . Small bowel repair    . Appendectomy  1960s  . Hernia repair    . Abdominal hernia repair    . Insertion distraction device lumbar posterior spinous process    . Anterior cervical decomp/discectomy fusion      C6-8   Family History  Problem Relation Age of Onset  . Coronary artery disease Father   . Diabetes type II Other   . Pancreatic cancer Brother   . Stroke Brother    Social History  Substance Use Topics  . Smoking status: Former Smoker  -- 0.12 packs/day for 20 years    Types: Cigarettes  . Smokeless tobacco: Former Systems developer     Comment: "quit smoking cigarettes in the 1980s"  . Alcohol Use: No    Review of Systems  Constitutional: Positive for fever and fatigue.  HENT: Negative for sore throat.   Eyes: Positive for visual disturbance (blurred bilaterally (reports this while bp low)).  Respiratory: Positive for cough and shortness of breath.   Cardiovascular: Negative for chest pain.  Gastrointestinal: Positive for nausea. Negative for vomiting, abdominal pain and diarrhea.  Genitourinary: Negative for difficulty urinating.  Musculoskeletal: Negative for back pain and neck stiffness.  Skin: Negative for rash.  Neurological: Positive for headaches. Negative for dizziness, seizures, syncope, facial asymmetry, weakness and numbness.      Allergies  Oxycodone-acetaminophen; Advair diskus; and Pentazocine lactate  Home Medications   Prior to Admission medications   Medication Sig Start Date End Date Taking? Authorizing Provider  albuterol (PROVENTIL HFA;VENTOLIN HFA) 108 (90 BASE) MCG/ACT inhaler Inhale 2 puffs into the lungs every 6 (six) hours as needed. Wheezing/shortness of breath    Yes Historical Provider, MD  aspirin EC 81 MG tablet Take 81 mg by mouth daily.     Yes Historical Provider, MD  budesonide-formoterol (SYMBICORT) 80-4.5 MCG/ACT inhaler Inhale 2 puffs into the lungs 2 (two) times daily.   Yes Historical Provider, MD  clobetasol cream (TEMOVATE) [id number] % Apply 1 application topically every 14 (fourteen) days. 2 weeks of use then stop for 2 weeks,  the repeat 12/18/15  Yes Historical Provider, MD  pantoprazole (PROTONIX) 40 MG tablet Take 40 mg by mouth daily.     Yes Historical Provider, MD  Pseudoeph-Doxylamine-DM-APAP (NYQUIL PO) Take 30 mLs by mouth every 12 (twelve) hours as needed (cold symptoms).   Yes Historical Provider, MD  Tamsulosin HCl (FLOMAX) 0.4 MG CAPS Take 0.4 mg by mouth daily.     Yes  Historical Provider, MD  telmisartan (MICARDIS) 40 MG tablet Take 40 mg by mouth daily. 11/28/15  Yes Historical Provider, MD  nitroGLYCERIN (NITROSTAT) 0.4 MG SL tablet Place 0.4 mg under the tongue every 5 (five) minutes as needed for chest pain.    Historical Provider, MD   BP 142/76 mmHg  Pulse 69  Temp(Src) 98.2 F (36.8 C) (Oral)  Resp 18  Wt 300 lb (136.079 kg)  SpO2 98% Physical Exam  Constitutional: He is oriented to person, place, and time. He appears well-developed and well-nourished. He appears ill. No distress.  HENT:  Head: Normocephalic and atraumatic.  Eyes: Conjunctivae and EOM are normal.  Neck: Normal range of motion.  Cardiovascular: Normal rate, regular rhythm, normal heart sounds and intact distal pulses.  Exam reveals no gallop and no friction rub.   No murmur heard. Pulmonary/Chest: Effort normal and breath sounds normal. No respiratory distress. He has no wheezes. He has no rales.  Abdominal: Soft. He exhibits no distension. There is no tenderness. There is no guarding.  Musculoskeletal: He exhibits no edema.  Neurological: He is alert and oriented to person, place, and time. He has normal strength. He displays no tremor. No cranial nerve deficit or sensory deficit. Coordination normal. GCS eye subscore is 4. GCS verbal subscore is 5. GCS motor subscore is 6.  Initially not responding to nurses, staring with roving eye movements (eyes not fixed or rhythmic), On my eval appears sleepy however responds to questions appropriately without hesitation  Skin: Skin is warm and dry. He is not diaphoretic.  Nursing note and vitals reviewed.   ED Course  Procedures (including critical care time) Labs Review Labs Reviewed  CBC WITH DIFFERENTIAL/PLATELET - Abnormal; Notable for the following:    Monocytes Absolute 1.3 (*)    All other components within normal limits  ACETAMINOPHEN LEVEL - Abnormal; Notable for the following:    Acetaminophen (Tylenol), Serum <10 (*)     All other components within normal limits  COMPREHENSIVE METABOLIC PANEL - Abnormal; Notable for the following:    CO2 21 (*)    Creatinine, Ser 1.30 (*)    Calcium 8.1 (*)    Total Protein 6.3 (*)    Albumin 3.1 (*)    GFR calc non Af Amer 56 (*)    All other components within normal limits  MAGNESIUM - Abnormal; Notable for the following:    Magnesium 1.5 (*)    All other components within normal limits  I-STAT CHEM 8, ED - Abnormal; Notable for the following:    BUN 23 (*)    Creatinine, Ser 1.30 (*)    Glucose, Bld 106 (*)    Calcium, Ion 1.05 (*)    All other components within normal limits  I-STAT VENOUS BLOOD GAS, ED - Abnormal; Notable for the following:    pH, Ven 7.480 (*)    pCO2, Ven 34.3 (*)    pO2, Ven 58.0 (*)    Bicarbonate 25.5 (*)    All other components within normal limits  I-STAT CG4 LACTIC ACID, ED - Abnormal; Notable for the  following:    Lactic Acid, Venous 2.54 (*)    All other components within normal limits  CULTURE, BLOOD (ROUTINE X 2)  CULTURE, BLOOD (ROUTINE X 2)  URINE CULTURE  MRSA PCR SCREENING  URINALYSIS, ROUTINE W REFLEX MICROSCOPIC (NOT AT [facility])  BRAIN NATRIURETIC PEPTIDE  URINE RAPID DRUG SCREEN, HOSP PERFORMED  SALICYLATE LEVEL  ETHANOL  LIPASE, BLOOD  PHOSPHORUS  INFLUENZA PANEL BY PCR (TYPE A & B, H1N1)  CBC  CREATININE, SERUM  MAGNESIUM  HEMOGLOBIN A1C  COMPREHENSIVE METABOLIC PANEL  CBC  CBG MONITORING, ED  I-STAT TROPOININ, ED  I-STAT CG4 LACTIC ACID, ED  I-STAT VENOUS BLOOD GAS, ED  I-STAT CHEM 8, ED    Imaging Review Ct Head Wo Contrast  02/05/2016  CLINICAL DATA:  Declining mental status EXAM: CT HEAD WITHOUT CONTRAST TECHNIQUE: Contiguous axial images were obtained from the base of the skull through the vertex without intravenous contrast. COMPARISON:  07/30/2006 FINDINGS: No mass effect, midline shift, or acute hemorrhage. Mild global atrophy. Cranium is intact. Nasal septum is deviated to the right. Mastoid air  cells are clear. Visualized paranasal sinuses are clear. IMPRESSION: No acute intracranial pathology. Electronically Signed   By: Marybelle Killings M.D.   On: 02/05/2016 11:38   Dg Chest Port 1 View  02/05/2016  CLINICAL DATA:  Cough for a few days. Altered mental status with shortness of breath. History of COPD and hypertension. EXAM: PORTABLE CHEST 1 VIEW COMPARISON:  11/20/2011 and 01/14/2011. FINDINGS: 1125 hours. Persistent low lung volumes with chronic bibasilar atelectasis or scarring, similar to previous examinations. No edema, airspace disease, pleural effusion or pneumothorax. The heart size and mediastinal contours are stable. Patient is status post lower cervical fusion. IMPRESSION: Stable chronic bibasilar atelectasis or scarring. No acute cardiopulmonary process. Electronically Signed   By: Richardean Sale M.D.   On: 02/05/2016 11:44   I have personally reviewed and evaluated these images and lab results as part of my medical decision-making.   EKG Interpretation None      MDM   Final diagnoses:  Sepsis, due to unspecified organism (HCC)  Hypotension, unspecified hypotension type  Acute nonintractable headache, unspecified headache type    65 year old male with a history of COPD, hypertension, BPH presents with concern for shortness of breath, headache, and episode of altered mental status. EMS was initially called to patient's residence secondary to dyspnea, cough for 2 days, and fever home. Patient subsequently developed a headache. Blood pressures were initially within normal limits, and neurologic exam was nonfocal with EMS, however in transport, the patient's blood pressures began to drop, and on arrival to the emergency department, patient's blood pressure over 0000000 systolic, and patient appeared altered, slow to respond.  Point-of-care test glucose was within normal limits.  EKG without acute ST changes. On my evaluation, patient appears sleepy, however appropriately answers  questions, is oriented, and reporting headache. 2 IVs were established, and 4500 mL of normal saline were initiated per sepsis protocol. Empiric vancomycin and Zosyn were ordered for sepsis of unclear source given initial hypotension.  After IVs were established, patient was taken to the CT scanner for emergent head CT. Head CT showed no acute findings. Although patient describes sudden onset headache, have low suspicion for subarachnoid hemorrhage in setting of negative head CT within 6 hours of onset, as well as multiple other concerns including cough, fever home and hypertension. Discussed possibility of lumbar puncture with patient and his daughter, and we agree it is not indicated at this time.  Patient has full range of motion of his neck, no neck stiffness, and have low suspicion for meningitis.  Feel given combination of symptoms, patient likley has influenza or other viral syndrome.  No sign of pneumonia on CXR at this time, no sign of UTI.   No CP to suggest dissection, and given cough/congestion do not feel work up for PE is indicated at this time unless pt does not improve as an inpt. Patient's blood pressures improved with IVF and he has been hemodynamically stable for hours in the ED.  Pt admitted to medicine for continued evaluation and treatment.     Gareth Morgan, MD 02/05/16 1734

## 2016-02-05 NOTE — Progress Notes (Signed)
Shane Clark is a 65 y.o. male patient admitted from ED awake, alert - oriented  X 4 - no acute distress noted.  VSS - Blood pressure 142/76, pulse 69, temperature 98.2 F (36.8 C), temperature source Oral, resp. rate 18, weight 136.079 kg (300 lb), SpO2 98 %.    IV in place, occlusive dsg intact without redness.  Orientation to room, and floor completed with information packet given to patient/family.  Patient declined safety video at this time.  Admission INP armband ID verified with patient/family, and in place.   SR up x 2, fall assessment complete, with patient and family able to verbalize understanding of risk associated with falls, and verbalized understanding to call nsg before up out of bed.  Call light within reach, patient able to voice, and demonstrate understanding.  Skin, clean-dry- intact without evidence of bruising, or skin tears.   No evidence of skin break down noted on exam.     Will cont to eval and treat per MD orders.  Deri Fuelling, RN 02/05/2016 4:49 PM

## 2016-02-05 NOTE — Progress Notes (Signed)
Pharmacy Antibiotic Note  Shane Clark is a 65 y.o. male admitted on 02/05/2016 with sepsis.  Pharmacy has been consulted for vancomycin and zosyn dosing. Pt presents with SOB, HA and AMS.   Pt received vancomycin 2g and zosyn 3.375g IV once in the ED.  Plan: Vancomycin 1750mg  IV every 24 hours.  Goal trough 15-20 mcg/mL. Zosyn 3.375g IV q8h (4 hour infusion).  Monitor culture data, renal function and clinical course [facility] at [facility] prn     No data recorded.   Recent Labs Lab 02/05/16 1113  CREATININE 1.30*    CrCl cannot be calculated (Unknown ideal weight.).    Allergies  Allergen Reactions  . Oxycodone-Acetaminophen Anaphylaxis  . Advair Diskus [Fluticasone-Salmeterol]   . Pentazocine Lactate Nausea Only and Other (See Comments)    Hallucinations    Antimicrobials this admission: Vanc 3/7 >>  Zosyn 3/7 >>   Dose adjustments this admission: n/a  Microbiology results: 3/7 BCx: sent  UCx:    Sputum:    MRSA PCR:    Andrey Cota. Diona Foley, PharmD, BCPS Clinical Pharmacist Pager [phone] 02/05/2016 11:16 AM   Pharmacy Code Sepsis Protocol  Time of code sepsis page: 1121 [x]  Antibiotics delivered at 1127 []  Antibiotics administered prior to code at  (if checked, omit next 2 questions)  Were antibiotics ordered at the time of the code sepsis page? Yes Was it required to contact the physician? []  Physician not contacted []  Physician contacted to order antibiotics for code sepsis []  Physician contacted to recommend changing antibiotics  Pharmacy consulted for: vancomycin and zosyn  Anti-infectives    Start     Dose/Rate Route Frequency Ordered Stop   02/05/16 1115  piperacillin-tazobactam (ZOSYN) IVPB 3.375 g     3.375 g 100 mL/hr over 30 Minutes Intravenous  Once 02/05/16 1113     02/05/16 1115  vancomycin (VANCOCIN) IVPB 1000 mg/200 mL premix     1,000 mg 200 mL/hr over 60 Minutes Intravenous  Once 02/05/16 1113          Nurse education provided: [x]  Minutes  left to administer antibiotics to achieve 1 hour goal [x]  Correct order of antibiotic administration [x]  Antibiotic Y-site compatibilities     Andrey Cota. Diona Foley, PharmD, [place] Clinical Pharmacist Pager [phone] 02/05/2016, 11:21 AM

## 2016-02-05 NOTE — H&P (Addendum)
[facility] History and Physical  Shane Clark [id number] DOB: 29-Oct-1951 DOA: 02/05/2016  Referring physician: Dr. Jonita Albee PCP: Simona Huh, MD   Chief Complaint: cough and sob  HPI: Shane Clark is a 65 y.o. male past medical history of COPD who comes into the hospital for cough and shortness of breath that started 2 days prior to admission. He relates his wife has been sick at home with an upper respiratory tract infection. He relates his had productive cough progressively getting worse. At the beginning he could walk to the bathroom without being short of breath but this morning he attempted to go to the restroom and he began noticing dyspnea on excision. He had been taking NyQuil for the last several days for his cough and took this morning and as per daughter he became more confused and hour after drinking the NyQuil. So she brought him to the ED. He related he had a fever at home 100.3, no culture source denies any chest pain nausea vomiting or diarrhea. No other new medications. He relates he's been anorexic and with poor oral intake. Over the last several days.  In the ED: Initially he was found to be hypotensive, after rechecking his blood pressure was 105/62 he received empiric IV Vanco and Zosyn and started on aggressive IV fluid hydration. An ABG was on the sole pH of 7.48/34/58, mild new acute renal failure and leukocytosis hemoglobin of 13, alcohol level and Tylenol level were within normal. Aspirin was less than 4, UDS was negative UA is unremarkable.   Review of Systems:  Constitutional:  No weight loss, night sweats, Fevers, chills, fatigue.  HEENT:  No headaches, Difficulty swallowing,Tooth/dental problems,Sore throat,  No sneezing, itching, ear ache, nasal congestion, post nasal drip,  Cardio-vascular:  No chest pain, Orthopnea, PND, swelling in lower extremities, anasarca, dizziness, palpitations  GI:  No heartburn, indigestion, abdominal pain,  nausea, vomiting, diarrhea, change in bowel habits, loss of appetite  Resp:  No chest wall deformity  Skin:  no rash or lesions.  GU:  no dysuria, change in color of urine, no urgency or frequency. No flank pain.  Musculoskeletal:  No joint pain or swelling. No decreased range of motion. No back pain.  Psych:  No change in mood or affect. No depression or anxiety. No memory loss.   Past Medical History  Diagnosis Date  . Hypertension   . COPD (chronic obstructive pulmonary disease) ([place])   . BPH (benign prostatic hyperplasia)    Past Surgical History  Procedure Laterality Date  . Appendectomy    . Hernia repair    . Back surgery    . Knee surgery    . Surgery for small bowel obstruction     Social History:  reports that he has quit smoking. He does not have any smokeless tobacco history on file. He reports that he does not drink alcohol. His drug history is not on file.  Allergies  Allergen Reactions  . Oxycodone-Acetaminophen Anaphylaxis  . Advair Diskus [Fluticasone-Salmeterol]   . Pentazocine Lactate Nausea Only and Other (See Comments)    Hallucinations    Family History  Problem Relation Age of Onset  . Coronary artery disease Father   . Diabetes type II Other   . Pancreatic cancer Brother   . Stroke Brother    Prior to Admission medications   Medication Sig Start Date End Date Taking? Authorizing Provider  albuterol (PROVENTIL HFA;VENTOLIN HFA) 108 (90 BASE) MCG/ACT inhaler Inhale 2 puffs into the  lungs every 6 (six) hours as needed. Wheezing/shortness of breath    Yes Historical Provider, MD  aspirin EC 81 MG tablet Take 81 mg by mouth daily.     Yes Historical Provider, MD  budesonide-formoterol (SYMBICORT) 80-4.5 MCG/ACT inhaler Inhale 2 puffs into the lungs 2 (two) times daily.   Yes Historical Provider, MD  clobetasol cream (TEMOVATE) [id number] % Apply 1 application topically every 14 (fourteen) days. 2 weeks of use then stop for 2 weeks, the repeat 12/18/15  Yes  Historical Provider, MD  pantoprazole (PROTONIX) 40 MG tablet Take 40 mg by mouth daily.     Yes Historical Provider, MD  Pseudoeph-Doxylamine-DM-APAP (NYQUIL PO) Take 30 mLs by mouth every 12 (twelve) hours as needed (cold symptoms).   Yes Historical Provider, MD  Tamsulosin HCl (FLOMAX) 0.4 MG CAPS Take 0.4 mg by mouth daily.     Yes Historical Provider, MD  telmisartan (MICARDIS) 40 MG tablet Take 40 mg by mouth daily. 11/28/15  Yes Historical Provider, MD  nitroGLYCERIN (NITROSTAT) 0.4 MG SL tablet Place 0.4 mg under the tongue every 5 (five) minutes as needed for chest pain.    Historical Provider, MD   Physical Exam: Filed Vitals:   02/05/16 1345 02/05/16 1400 02/05/16 1415 02/05/16 1430  BP: 117/62 126/71 125/65 121/62  Pulse: 71 71 71 67  Temp:      TempSrc:      Resp: 22     Weight:      SpO2: 96% 96% 97% 97%    Wt Readings from Last 3 Encounters:  02/05/16 136.079 kg (300 lb)  03/29/15 136.079 kg (300 lb)  03/16/15 136.261 kg (300 lb 6.4 oz)    General:  Appears calm and comfortable, I will carry on a conversation Eyes: PERRL, normal lids, irises & conjunctiva ENT: Mucous membranes are dry. Neck: no LAD, masses or thyromegaly Cardiovascular: RRR, no m/r/g. No LE edema. Telemetry: SR, no arrhythmias  Respiratory: He has good air movement with diffuse wheezing bilaterally Abdomen: Abdomen is soft, nondistended nontender with positive bowel sounds. Skin: no rash or induration seen on limited exam Musculoskeletal: grossly normal tone BUE/BLE Psychiatric: grossly normal mood and affect, speech fluent and appropriate Neurologic: He is awake alert and oriented 3 with a coherent for language and carry on a conversation through 12 are grossly intact sensation is intact throughout muscle strength is 5 of doxorubicin deep tendon reflexes 2+ symmetrical and bilaterally.           Labs on Admission:  Basic Metabolic Panel:  Recent Labs Lab 02/05/16 1113 02/05/16 1312  NA  135 136  K 4.9 4.5  CL 101 104  CO2  --  21*  GLUCOSE 106* 94  BUN 23* 14  CREATININE 1.30* 1.30*  CALCIUM  --  8.1*  MG  --  1.5*  PHOS  --  2.6   Liver Function Tests:  Recent Labs Lab 02/05/16 1312  AST 27  ALT 23  ALKPHOS 68  BILITOT 0.8  PROT 6.3*  ALBUMIN 3.1*    Recent Labs Lab 02/05/16 1312  LIPASE 28   No results for input(s): AMMONIA in the last 168 hours. CBC:  Recent Labs Lab 02/05/16 1113 02/05/16 1121  WBC  --  6.8  NEUTROABS  --  4.0  HGB 15.0 13.7  HCT 44.0 40.4  MCV  --  91.8  PLT  --  235   Cardiac Enzymes: No results for input(s): CKTOTAL, CKMB, CKMBINDEX, TROPONINI in the last 168  hours.  BNP (last 3 results)  Recent Labs  02/05/16 1121  BNP 8.7    ProBNP (last 3 results) No results for input(s): PROBNP in the last 8760 hours.  CBG:  Recent Labs Lab 02/05/16 1056  GLUCAP 99    Radiological Exams on Admission: Ct Head Wo Contrast  02/05/2016  CLINICAL DATA:  Declining mental status EXAM: CT HEAD WITHOUT CONTRAST TECHNIQUE: Contiguous axial images were obtained from the base of the skull through the vertex without intravenous contrast. COMPARISON:  07/30/2006 FINDINGS: No mass effect, midline shift, or acute hemorrhage. Mild global atrophy. Cranium is intact. Nasal septum is deviated to the right. Mastoid air cells are clear. Visualized paranasal sinuses are clear. IMPRESSION: No acute intracranial pathology. Electronically Signed   By: Marybelle Killings M.D.   On: 02/05/2016 11:38   Dg Chest Port 1 View  02/05/2016  CLINICAL DATA:  Cough for a few days. Altered mental status with shortness of breath. History of COPD and hypertension. EXAM: PORTABLE CHEST 1 VIEW COMPARISON:  11/20/2011 and 01/14/2011. FINDINGS: 1125 hours. Persistent low lung volumes with chronic bibasilar atelectasis or scarring, similar to previous examinations. No edema, airspace disease, pleural effusion or pneumothorax. The heart size and mediastinal contours are  stable. Patient is status post lower cervical fusion. IMPRESSION: Stable chronic bibasilar atelectasis or scarring. No acute cardiopulmonary process. Electronically Signed   By: Richardean Sale M.D.   On: 02/05/2016 11:44    EKG: Independently reviewed.   Assessment/Plan  COPD exacerbation (HCC)/ Cough/Hypotension: Initially on admission to the hospital he was not hypoxic, but he was mildly hypotensive with a mild elevation of his lactic acid. He did not receive IV fluid but when the blood pressure was rechecked it increased to 105/65. He did take his antihypertensive medications this morning. I think is most likely a COPD exacerbation, will start him on IV empiric antibiotics and inhalers and antibiotics. I think is the mild rise in his lactic acidosis probably due to an episode of hypoxia. He has no leukocytosis and his blood pressure is stable. Influenza PCR is negative.  Essential hypertension: Hold all antihypertensive medications. Recheck in the morning.    Acute encephalopathy: CT of the head showed no acute findings. This probably due to the use of NyQuil. According to his daughter his mentation has returned to baseline.   Code Status: full DVT Prophylaxis:heparin Family Communication: daughter Disposition Plan: inpatient  Time spent: 35 min  Charlynne Cousins [facility] Pager [phone]

## 2016-02-05 NOTE — ED Notes (Signed)
DR. Billy Fischer aware that 6500 ml total is ordered, Confirmed with Dr. Billy Fischer that patient should only receive a total of 4511ml of normal saline.

## 2016-02-05 NOTE — ED Notes (Signed)
Patient transported to cT

## 2016-02-06 ENCOUNTER — Inpatient Hospital Stay (HOSPITAL_COMMUNITY): Payer: 59

## 2016-02-06 DIAGNOSIS — G9340 Encephalopathy, unspecified: Secondary | ICD-10-CM

## 2016-02-06 DIAGNOSIS — I10 Essential (primary) hypertension: Secondary | ICD-10-CM

## 2016-02-06 DIAGNOSIS — R7881 Bacteremia: Secondary | ICD-10-CM

## 2016-02-06 DIAGNOSIS — J441 Chronic obstructive pulmonary disease with (acute) exacerbation: Secondary | ICD-10-CM

## 2016-02-06 LAB — CBC
HEMATOCRIT: 40.2 % (ref 39.0–52.0)
HEMOGLOBIN: 13.1 g/dL (ref 13.0–17.0)
MCH: 30.6 pg (ref 26.0–34.0)
MCHC: 32.6 g/dL (ref 30.0–36.0)
MCV: 93.9 fL (ref 78.0–100.0)
Platelets: 179 10*3/uL (ref 150–400)
RBC: 4.28 MIL/uL (ref 4.22–5.81)
RDW: 13.5 % (ref 11.5–15.5)
WBC: 2.9 10*3/uL — AB (ref 4.0–10.5)

## 2016-02-06 LAB — COMPREHENSIVE METABOLIC PANEL
ALBUMIN: 3.1 g/dL — AB (ref 3.5–5.0)
ALT: 24 U/L (ref 17–63)
ANION GAP: 8 (ref 5–15)
AST: 27 U/L (ref 15–41)
Alkaline Phosphatase: 68 U/L (ref 38–126)
BILIRUBIN TOTAL: 0.8 mg/dL (ref 0.3–1.2)
BUN: 12 mg/dL (ref 6–20)
CO2: 18 mmol/L — ABNORMAL LOW (ref 22–32)
Calcium: 8.1 mg/dL — ABNORMAL LOW (ref 8.9–10.3)
Chloride: 112 mmol/L — ABNORMAL HIGH (ref 101–111)
Creatinine, Ser: 1.05 mg/dL (ref 0.61–1.24)
GLUCOSE: 173 mg/dL — AB (ref 65–99)
POTASSIUM: 5 mmol/L (ref 3.5–5.1)
Sodium: 138 mmol/L (ref 135–145)
TOTAL PROTEIN: 6.4 g/dL — AB (ref 6.5–8.1)

## 2016-02-06 LAB — HEMOGLOBIN A1C
HEMOGLOBIN A1C: 6.1 % — AB (ref 4.8–5.6)
Mean Plasma Glucose: 128 mg/dL

## 2016-02-06 LAB — URINE CULTURE: Culture: NO GROWTH

## 2016-02-06 MED ORDER — ALBUTEROL SULFATE (2.5 MG/3ML) 0.083% IN NEBU
2.5000 mg | INHALATION_SOLUTION | Freq: Three times a day (TID) | RESPIRATORY_TRACT | Status: DC
  Administered 2016-02-06 – 2016-02-07 (×4): 2.5 mg via RESPIRATORY_TRACT
  Filled 2016-02-06 (×4): qty 3

## 2016-02-06 MED ORDER — VANCOMYCIN HCL 10 G IV SOLR
1500.0000 mg | INTRAVENOUS | Status: DC
  Administered 2016-02-06: 1500 mg via INTRAVENOUS
  Filled 2016-02-06 (×2): qty 1500

## 2016-02-06 MED ORDER — PREDNISONE 50 MG PO TABS
60.0000 mg | ORAL_TABLET | Freq: Every day | ORAL | Status: DC
  Administered 2016-02-06 – 2016-02-07 (×2): 60 mg via ORAL
  Filled 2016-02-06 (×4): qty 1

## 2016-02-06 MED ORDER — DOXYCYCLINE HYCLATE 100 MG PO TABS
100.0000 mg | ORAL_TABLET | Freq: Two times a day (BID) | ORAL | Status: DC
  Administered 2016-02-06 – 2016-02-07 (×2): 100 mg via ORAL
  Filled 2016-02-06 (×2): qty 1

## 2016-02-06 NOTE — Progress Notes (Signed)
[facility] PROGRESS NOTE  DAMEK BRAZILE [id number] DOB: [birth year] DOA: 02/05/2016  PCP: Simona Huh, MD  Brief HPI: 65 year old Caucasian male with a past medical history of COPD, presented to the hospital with complaint of cough and shortness of breath. Apparently patient's wife had been sick at home with an upper respiratory tract infection. Patient was hospitalized for further management.  Past medical history:  Past Medical History  Diagnosis Date  . Hypertension   . COPD (chronic obstructive pulmonary disease) ([place])   . BPH (benign prostatic hyperplasia)   . GERD (gastroesophageal reflux disease)   . Chronic back pain     "all over"    Consultants: None  Procedures: None  Antibiotics: Doxycycline  Subjective: Patient feels better this morning. Denies any chest pain. Breathing is improved. Cough is improved. Denies any nausea or vomiting.  Objective: Vital Signs  Filed Vitals:   02/05/16 2011 02/05/16 2125 02/06/16 0617 02/06/16 0717  BP:  148/72 150/77   Pulse: 78 69 69   Temp:  97.6 F (36.4 C) 97.4 F (36.3 C)   TempSrc:  Oral Oral   Resp: 16 18    Weight:      SpO2: 97% 95% 97% 95%    Intake/Output Summary (Last 24 hours) at 02/06/16 [id number] Last data filed at 02/06/16 0751  Gross per 24 hour  Intake   1150 ml  Output   1500 ml  Net   -350 ml   Filed Weights   02/05/16 1124  Weight: 136.079 kg (300 lb)    General appearance: alert, cooperative, appears stated age and no distress Resp: Coarse breath sounds bilaterally. No wheezing. No rales. Cardio: regular rate and rhythm, S1, S2 normal, no murmur, click, rub or gallop GI: soft, non-tender; bowel sounds normal; no masses,  no organomegaly Extremities: extremities normal, atraumatic, no cyanosis or edema Neurologic: Awake and alert. Oriented 3. No focal neurological signs noted.  Lab Results:  Basic Metabolic Panel:  Recent Labs Lab 02/05/16 1113 02/05/16 1312  02/05/16 1723 02/06/16 0420  NA 135 136  --  138  K 4.9 4.5  --  5.0  CL 101 104  --  112*  CO2  --  21*  --  18*  GLUCOSE 106* 94  --  173*  BUN 23* 14  --  12  CREATININE 1.30* 1.30* 1.25* 1.05  CALCIUM  --  8.1*  --  8.1*  MG  --  1.5* 1.6*  --   PHOS  --  2.6  --   --    Liver Function Tests:  Recent Labs Lab 02/05/16 1312 02/06/16 0420  AST 27 27  ALT 23 24  ALKPHOS 68 68  BILITOT 0.8 0.8  PROT 6.3* 6.4*  ALBUMIN 3.1* 3.1*    Recent Labs Lab 02/05/16 1312  LIPASE 28   CBC:  Recent Labs Lab 02/05/16 1113 02/05/16 1121 02/05/16 1723 02/06/16 0420  WBC  --  6.8 3.9* 2.9*  NEUTROABS  --  4.0  --   --   HGB 15.0 13.7 13.1 13.1  HCT 44.0 40.4 39.1 40.2  MCV  --  91.8 92.9 93.9  PLT  --  235 177 179   Cardiac Enzymes: No results for input(s): CKTOTAL, CKMB, CKMBINDEX, TROPONINI in the last 168 hours. BNP (last 3 results)  Recent Labs  02/05/16 1121  BNP 8.7    CBG:  Recent Labs Lab 02/05/16 1056  GLUCAP 99    Recent Results (from the  past 240 hour(s))  Culture, blood (Routine X 2) w Reflex to ID Panel     Status: None (Preliminary result)   Collection Time: 02/05/16 11:15 AM  Result Value Ref Range Status   Specimen Description BLOOD LEFT HAND  Final   Special Requests BOTTLES DRAWN AEROBIC AND ANAEROBIC 4CC  Final   Culture  Setup Time   Final    GRAM POSITIVE COCCI IN CLUSTERS ANAEROBIC BOTTLE ONLY CRITICAL RESULT CALLED TO, READ BACK BY AND VERIFIED WITH: K DUFFY,RN @0727  02/06/16 MKELLY    Culture PENDING  Incomplete   Report Status PENDING  Incomplete  MRSA PCR Screening     Status: None   Collection Time: 02/05/16  4:09 PM  Result Value Ref Range Status   MRSA by PCR NEGATIVE NEGATIVE Final    Comment:        The GeneXpert MRSA Assay (FDA approved for NASAL specimens only), is one component of a comprehensive MRSA colonization surveillance program. It is not intended to diagnose MRSA infection nor to guide or monitor  treatment for MRSA infections.       Studies/Results: X-ray Chest Pa And Lateral  02/06/2016  CLINICAL DATA:  Cough and shortness of breath, acute encephalopathy ; history of COPD, former smoker EXAM: CHEST  2 VIEW COMPARISON:  Portable chest x-ray of February 05, 2016 FINDINGS: The lungs are mildly hypoinflated. There is bibasilar subsegmental atelectasis. There is no pleural effusion or pneumothorax. The heart is not enlarged. The pulmonary vascularity is not engorged. The trachea is midline. The bony thorax is unremarkable. IMPRESSION: Persistent hypoinflation with mild bibasilar atelectasis. There is no CHF or definite pneumonia. Electronically Signed   By: David  Martinique M.D.   On: 02/06/2016 07:32   Ct Head Wo Contrast  02/05/2016  CLINICAL DATA:  Declining mental status EXAM: CT HEAD WITHOUT CONTRAST TECHNIQUE: Contiguous axial images were obtained from the base of the skull through the vertex without intravenous contrast. COMPARISON:  07/30/2006 FINDINGS: No mass effect, midline shift, or acute hemorrhage. Mild global atrophy. Cranium is intact. Nasal septum is deviated to the right. Mastoid air cells are clear. Visualized paranasal sinuses are clear. IMPRESSION: No acute intracranial pathology. Electronically Signed   By: Marybelle Killings M.D.   On: 02/05/2016 11:38   Dg Chest Port 1 View  02/05/2016  CLINICAL DATA:  Cough for a few days. Altered mental status with shortness of breath. History of COPD and hypertension. EXAM: PORTABLE CHEST 1 VIEW COMPARISON:  11/20/2011 and 01/14/2011. FINDINGS: 1125 hours. Persistent low lung volumes with chronic bibasilar atelectasis or scarring, similar to previous examinations. No edema, airspace disease, pleural effusion or pneumothorax. The heart size and mediastinal contours are stable. Patient is status post lower cervical fusion. IMPRESSION: Stable chronic bibasilar atelectasis or scarring. No acute cardiopulmonary process. Electronically Signed   By: Richardean Sale M.D.   On: 02/05/2016 11:44    Medications:  Scheduled: . albuterol  2.5 mg Nebulization TID  . antiseptic oral rinse  7 mL Mouth Rinse BID  . aspirin EC  81 mg Oral Daily  . doxycycline  100 mg Oral Q12H  . heparin  5,000 Units Subcutaneous 3 times per day  . mometasone-formoterol  2 puff Inhalation BID  . pantoprazole  40 mg Oral Daily  . predniSONE  60 mg Oral Q breakfast  . tamsulosin  0.4 mg Oral Daily  . tiotropium  18 mcg Inhalation Daily  . vancomycin  1,500 mg Intravenous Q24H   Continuous: .  sodium chloride 75 mL/hr at 02/06/16 0640   [id number] **OR** acetaminophen, albuterol, nitroGLYCERIN, ondansetron **OR** ondansetron (ZOFRAN) IV, polyethylene glycol  Assessment/Plan:  Active Problems:   Cough   COPD exacerbation (HCC)   Essential hypertension   Hypotension   Acute encephalopathy    Acute COPD exacerbation Repeat chest x-ray also does not show any infiltrates. Patient is feeling better. Continue current treatment for now. Change to oral antibiotics. Mobilize. Check room saturations. Continue inhaled steroids  Bacteremia One set of blood cultures noted to be growing gram-positive cocci in clusters. Continue IV vancomycin. This is most likely a contaminant.  Acute encephalopathy. Patient appears to be back to his baseline. Confusion might have been related to use of NyQuil. CT head did not show any acute findings.  History of essential hypertension Currently off of his ARB due to mild hyperkalemia. Labs will be repeated tomorrow. Monitor blood pressures closely.  DVT Prophylaxis: Subcutaneous heparin    Code Status: Full code  Family Communication: Discussed with the patient  Disposition Plan: Mobilize. Await final identification of the bacteremia. Anticipate discharge 1- 2 days.    LOS: 1 day   [facility] Pager [phone] 02/06/2016, 8:21 AM  If 7PM-7AM, please contact night-coverage at [email or web address], password  [facility]

## 2016-02-06 NOTE — Progress Notes (Signed)
Pt at rest on room air O2 sats were 96%, on ambulation, still on RA, pts o2 sats ranged from 91-96%. Pt denied any SOB or dizziness with ambulation. Pt remains on RA

## 2016-02-07 LAB — CBC
HEMATOCRIT: 38.1 % — AB (ref 39.0–52.0)
HEMOGLOBIN: 13 g/dL (ref 13.0–17.0)
MCH: 31.8 pg (ref 26.0–34.0)
MCHC: 34.1 g/dL (ref 30.0–36.0)
MCV: 93.2 fL (ref 78.0–100.0)
Platelets: 187 10*3/uL (ref 150–400)
RBC: 4.09 MIL/uL — ABNORMAL LOW (ref 4.22–5.81)
RDW: 13.5 % (ref 11.5–15.5)
WBC: 11.9 10*3/uL — AB (ref 4.0–10.5)

## 2016-02-07 LAB — BASIC METABOLIC PANEL
ANION GAP: 12 (ref 5–15)
BUN: 16 mg/dL (ref 6–20)
CALCIUM: 8.5 mg/dL — AB (ref 8.9–10.3)
CHLORIDE: 107 mmol/L (ref 101–111)
CO2: 21 mmol/L — AB (ref 22–32)
Creatinine, Ser: 1.01 mg/dL (ref 0.61–1.24)
GFR calc non Af Amer: >60 mL/min (ref >60)
Glucose, Bld: 113 mg/dL — ABNORMAL HIGH (ref 65–99)
Potassium: 4.3 mmol/L (ref 3.5–5.1)
Sodium: 140 mmol/L (ref 135–145)

## 2016-02-07 MED ORDER — DOXYCYCLINE HYCLATE 100 MG PO TABS: 100.0000 mg | ORAL_TABLET | Freq: Two times a day (BID) | ORAL | Status: DC

## 2016-02-07 MED ORDER — PREDNISONE 20 MG PO TABS: ORAL_TABLET | ORAL | Status: DC

## 2016-02-07 MED FILL — predniSONE 20 MG TABS: 20 | 8 days supply | Qty: 15 | Fill #0

## 2016-02-07 MED FILL — DOXYCYCLINE HYCLATE 100 MG: 100 | 6 days supply | Qty: 12 | Fill #0

## 2016-02-07 NOTE — Progress Notes (Signed)
Nsg Discharge Note  Admit Date:  02/05/2016 Discharge date: 02/07/2016    Ro[place] Harps to be D/C'd Home per MD order.  AVS completed.  Copy for chart, and copy for patient signed, and dated. Patient/caregiver able to verbalize understanding.  Discharge Medication:   Medication List    STOP taking these medications        NYQUIL PO      TAKE these medications        albuterol 108 (90 Base) MCG/ACT inhaler  Commonly known as:  PROVENTIL HFA;VENTOLIN HFA  Inhale 2 puffs into the lungs every 6 (six) hours as needed. Wheezing/shortness of breath     aspirin EC 81 MG tablet  Take 81 mg by mouth daily.     budesonide-formoterol 80-4.5 MCG/ACT inhaler  Commonly known as:  SYMBICORT  Inhale 2 puffs into the lungs 2 (two) times daily.     clobetasol cream 0.05 %  Commonly known as:  TEMOVATE  Apply 1 application topically every 14 (fourteen) days. 2 weeks of use then stop for 2 weeks, the repeat     doxycycline 100 MG tablet  Commonly known as:  VIBRA-TABS  Take 1 tablet (100 mg total) by mouth every 12 (twelve) hours. For 6 more days.     nitroGLYCERIN 0.4 MG SL tablet  Commonly known as:  NITROSTAT  Place 0.4 mg under the tongue every 5 (five) minutes as needed for chest pain.     pantoprazole 40 MG tablet  Commonly known as:  PROTONIX  Take 40 mg by mouth daily.     predniSONE 20 MG tablet  Commonly known as:  DELTASONE  Take 3 tablets once daily for 2 days, then take 2 tablets once daily for 3 days, then take 1 tablet once daily for 3 days, then STOP     tamsulosin 0.4 MG Caps capsule  Commonly known as:  FLOMAX  Take 0.4 mg by mouth daily.     telmisartan 40 MG tablet  Commonly known as:  MICARDIS  Take 40 mg by mouth daily.        Discharge Assessment: Filed Vitals:   02/06/16 2136 02/07/16 0530  BP: 139/75 134/79  Pulse: 69 64  Temp: 97.7 F (36.5 C) 97.7 F (36.5 C)  Resp: 16 18   Skin clean, dry and intact without evidence of skin break down, no  evidence of skin tears noted. IV catheter discontinued intact. Site without signs and symptoms of complications - no redness or edema noted at insertion site, patient denies c/o pain - only slight tenderness at site.  Dressing with slight pressure applied.  D/c Instructions-Education: Discharge instructions given to patient/family with verbalized understanding. D/c education completed with patient/family including follow up instructions, medication list, d/c activities limitations if indicated, with other d/c instructions as indicated by MD - patient able to verbalize understanding, all questions fully answered. Patient instructed to return to ED, call 911, or call MD for any changes in condition.  Patient escorted via [place], and D/C home via private auto.   Dayle [place], RN 02/07/2016 11:52 AM

## 2016-02-07 NOTE — Discharge Instructions (Signed)
Chronic Obstructive Pulmonary Disease Chronic obstructive pulmonary disease (COPD) is a common lung condition in which airflow from the lungs is limited. COPD is a general term that can be used to describe many different lung problems that limit airflow, including both chronic bronchitis and emphysema. If you have COPD, your lung function will probably never return to normal, but there are measures you can take to improve lung function and make yourself feel better. CAUSES   Smoking (common).  Exposure to secondhand smoke.  Genetic problems.  Chronic inflammatory lung diseases or recurrent infections. SYMPTOMS  Shortness of breath, especially with physical activity.  Deep, persistent (chronic) cough with a large amount of thick mucus.  Wheezing.  Rapid breaths (tachypnea).  Gray or bluish discoloration (cyanosis) of the skin, especially in your fingers, toes, or lips.  Fatigue.  Weight loss.  Frequent infections or episodes when breathing symptoms become much worse (exacerbations).  Chest tightness. DIAGNOSIS Your health care provider will take a medical history and perform a physical examination to diagnose COPD. Additional tests for COPD may include:  Lung (pulmonary) function tests.  Chest X-ray.  CT scan.  Blood tests. TREATMENT  Treatment for COPD may include:  Inhaler and nebulizer medicines. These help manage the symptoms of COPD and make your breathing more comfortable.  Supplemental oxygen. Supplemental oxygen is only helpful if you have a low oxygen level in your blood.  Exercise and physical activity. These are beneficial for nearly all people with COPD.  Lung surgery or transplant.  Nutrition therapy to gain weight, if you are underweight.  Pulmonary rehabilitation. This may involve working with a team of health care providers and specialists, such as respiratory, occupational, and physical therapists. HOME CARE INSTRUCTIONS  Take all medicines  (inhaled or pills) as directed by your health care provider.  Avoid over-the-counter medicines or cough syrups that dry up your airway (such as antihistamines) and slow down the elimination of secretions unless instructed otherwise by your health care provider.  If you are a smoker, the most important thing that you can do is stop smoking. Continuing to smoke will cause further lung damage and breathing trouble. Ask your health care provider for help with quitting smoking. He or she can direct you to community resources or hospitals that provide support.  Avoid exposure to irritants such as smoke, chemicals, and fumes that aggravate your breathing.  Use oxygen therapy and pulmonary rehabilitation if directed by your health care provider. If you require home oxygen therapy, ask your health care provider whether you should purchase a pulse oximeter to measure your oxygen level at home.  Avoid contact with individuals who have a contagious illness.  Avoid extreme temperature and humidity changes.  Eat healthy foods. Eating smaller, more frequent meals and resting before meals may help you maintain your strength.  Stay active, but balance activity with periods of rest. Exercise and physical activity will help you maintain your ability to do things you want to do.  Preventing infection and hospitalization is very important when you have COPD. Make sure to receive all the vaccines your health care provider recommends, especially the pneumococcal and influenza vaccines. Ask your health care provider whether you need a pneumonia vaccine.  Learn and use relaxation techniques to manage stress.  Learn and use controlled breathing techniques as directed by your health care provider. Controlled breathing techniques include:  Pursed lip breathing. Start by breathing in (inhaling) through your nose for 1 second. Then, purse your lips as if you were   going to whistle and breathe out (exhale) through the  pursed lips for 2 seconds.  Diaphragmatic breathing. Start by putting one hand on your abdomen just above your waist. Inhale slowly through your nose. The hand on your abdomen should move out. Then purse your lips and exhale slowly. You should be able to feel the hand on your abdomen moving in as you exhale.  Learn and use controlled coughing to clear mucus from your lungs. Controlled coughing is a series of short, progressive coughs. The steps of controlled coughing are: 1. Lean your head slightly forward. 2. Breathe in deeply using diaphragmatic breathing. 3. Try to hold your breath for 3 seconds. 4. Keep your mouth slightly open while coughing twice. 5. Spit any mucus out into a tissue. 6. Rest and repeat the steps once or twice as needed. SEEK MEDICAL CARE IF:  You are coughing up more mucus than usual.  There is a change in the color or thickness of your mucus.  Your breathing is more labored than usual.  Your breathing is faster than usual. SEEK IMMEDIATE MEDICAL CARE IF:  You have shortness of breath while you are resting.  You have shortness of breath that prevents you from:  Being able to talk.  Performing your usual physical activities.  You have chest pain lasting longer than 5 minutes.  Your skin color is more cyanotic than usual.  You measure low oxygen saturations for longer than 5 minutes with a pulse oximeter. MAKE SURE YOU:  Understand these instructions.  Will watch your condition.  Will get help right away if you are not doing well or get worse.   This information is not intended to replace advice given to you by your health care provider. Make sure you discuss any questions you have with your health care provider.   Document Released: 08/27/2005 Document Revised: 12/08/2014 Document Reviewed: 07/14/2013 Elsevier Interactive Patient Education 2016 [employer]  

## 2016-02-07 NOTE — Care Management Note (Signed)
Case Management Note  Patient Details  Name: Shane Clark MRN: [id number] Date of Birth: 1951/04/25  Subjective/Objective:                 Patient admitted for cough with H/O COPD.    Action/Plan:  No CM needs identified. DC to home, self care.  Expected Discharge Date:                  Expected Discharge Plan:  Home/Self Care  In-House Referral:     Discharge planning Services  CM Consult  Post Acute Care Choice:  NA Choice offered to:     DME Arranged:    DME Agency:     HH Arranged:    [place] Agency:     Status of Service:  Completed, signed off  Medicare Important Message Given:    Date Medicare IM Given:    Medicare IM give by:    Date Additional Medicare IM Given:    Additional Medicare Important Message give by:     If discussed at [place] of Stay Meetings, dates discussed:    Additional Comments:  Carles Collet, RN 02/07/2016, 12:04 PM

## 2016-02-07 NOTE — Discharge Summary (Signed)
[facility]  Physician Discharge Summary   Patient ID: Shane Clark MRN: [id number] DOB/AGE: [birth year] 65 y.o.  Admit date: 02/05/2016 Discharge date: 02/07/2016  PCP: Simona Huh, MD  DISCHARGE DIAGNOSES:  Active Problems:   Cough   COPD exacerbation ([place])   Essential hypertension   Hypotension   Acute encephalopathy   RECOMMENDATIONS FOR OUTPATIENT FOLLOW UP: 1. Outpatient follow-up with PCP   DISCHARGE CONDITION: fair  Diet recommendation: Low-sodium  Filed Weights   02/05/16 1124  Weight: 136.079 kg (300 lb)    INITIAL HISTORY: 65 year old Caucasian male with a past medical history of COPD, presented to the hospital with complaint of cough and shortness of breath. Apparently patient's wife had been sick at home with an upper respiratory tract infection. Patient was hospitalized for further management.  Consultations:  None  Procedures:  None  HOSPITAL COURSE:   Acute COPD exacerbation Patient was admitted to the hospital for further management of her shortness of breath and cough. It was thought to be due to COPD exacerbation. Initial chest x-ray did not show any infiltrates. Repeat chest x-ray also does not show any infiltrates. Patient quickly improved with treatment. He was changed over to oral antibiotics. He was saturating normally on room air. He is okay for discharge today. He'll be prescribed doxycycline and prednisone taper.  Bacteremia One set of blood cultures noted to be growing gram-positive cocci in clusters. Conform with the lab this morning that this is coagulase-negative staph. Patient remains afebrile. This is most likely a contaminant. No need for further evaluation. He was given IV vancomycin while waiting for identification.   Acute encephalopathy. Patient appears to be back to his baseline. Confusion might have been related to use of NyQuil. CT head did not show any acute findings.  History of essential hypertension He  may resume his home medication regimen. Potassium is normal today.  Overall improved. Patient is keen on going home. Ambulated without any difficulty. Okay for discharge today.    PERTINENT LABS:  The results of significant diagnostics from this hospitalization (including imaging, microbiology, ancillary and laboratory) are listed below for reference.    Microbiology: Recent Results (from the past 240 hour(s))  Culture, blood (Routine X 2) w Reflex to ID Panel     Status: None (Preliminary result)   Collection Time: 02/05/16 11:15 AM  Result Value Ref Range Status   Specimen Description BLOOD LEFT HAND  Final   Special Requests BOTTLES DRAWN AEROBIC AND ANAEROBIC 4CC  Final   Culture  Setup Time   Final    GRAM POSITIVE COCCI IN CLUSTERS IN BOTH AEROBIC AND ANAEROBIC BOTTLES CRITICAL RESULT CALLED TO, READ BACK BY AND VERIFIED WITH: K DUFFY,RN @0727  02/06/16 MKELLY    Culture   Final    STAPHYLOCOCCUS SPECIES (COAGULASE NEGATIVE) THE SIGNIFICANCE OF ISOLATING THIS ORGANISM FROM A SINGLE SET OF BLOOD CULTURES WHEN MULTIPLE SETS ARE DRAWN IS UNCERTAIN. PLEASE NOTIFY THE MICROBIOLOGY DEPARTMENT WITHIN ONE WEEK IF SPECIATION AND SENSITIVITIES ARE REQUIRED.    Report Status PENDING  Incomplete  Culture, blood (Routine X 2) w Reflex to ID Panel     Status: None (Preliminary result)   Collection Time: 02/05/16 11:36 AM  Result Value Ref Range Status   Specimen Description BLOOD RIGHT ANTECUBITAL  Final   Special Requests BOTTLES DRAWN AEROBIC AND ANAEROBIC 10CC  Final   Culture NO GROWTH 2 DAYS  Final   Report Status PENDING  Incomplete  Urine culture     Status: None  Collection Time: 02/05/16  1:59 PM  Result Value Ref Range Status   Specimen Description URINE, CLEAN CATCH  Final   Special Requests NONE  Final   Culture NO GROWTH 1 DAY  Final   Report Status 02/06/2016 FINAL  Final  MRSA PCR Screening     Status: None   Collection Time: 02/05/16  4:09 PM  Result Value Ref Range  Status   MRSA by PCR NEGATIVE NEGATIVE Final    Comment:        The GeneXpert MRSA Assay (FDA approved for NASAL specimens only), is one component of a comprehensive MRSA colonization surveillance program. It is not intended to diagnose MRSA infection nor to guide or monitor treatment for MRSA infections.      Labs: Basic Metabolic Panel:  Recent Labs Lab 02/05/16 1113 02/05/16 1312 02/05/16 1723 02/06/16 0420 02/07/16 0455  NA 135 136  --  138 140  K 4.9 4.5  --  5.0 4.3  CL 101 104  --  112* 107  CO2  --  21*  --  18* 21*  GLUCOSE 106* 94  --  173* 113*  BUN 23* 14  --  12 16  CREATININE 1.30* 1.30* 1.25* 1.05 1.01  CALCIUM  --  8.1*  --  8.1* 8.5*  MG  --  1.5* 1.6*  --   --   PHOS  --  2.6  --   --   --    Liver Function Tests:  Recent Labs Lab 02/05/16 1312 02/06/16 0420  AST 27 27  ALT 23 24  ALKPHOS 68 68  BILITOT 0.8 0.8  PROT 6.3* 6.4*  ALBUMIN 3.1* 3.1*    Recent Labs Lab 02/05/16 1312  LIPASE 28   CBC:  Recent Labs Lab 02/05/16 1113 02/05/16 1121 02/05/16 1723 02/06/16 0420 02/07/16 0455  WBC  --  6.8 3.9* 2.9* 11.9*  NEUTROABS  --  4.0  --   --   --   HGB 15.0 13.7 13.1 13.1 13.0  HCT 44.0 40.4 39.1 40.2 38.1*  MCV  --  91.8 92.9 93.9 93.2  PLT  --  235 177 179 187   BNP: BNP (last 3 results)  Recent Labs  02/05/16 1121  BNP 8.7    CBG:  Recent Labs Lab 02/05/16 1056  GLUCAP 99     IMAGING STUDIES X-ray Chest Pa And Lateral  02/06/2016  CLINICAL DATA:  Cough and shortness of breath, acute encephalopathy ; history of COPD, former smoker EXAM: CHEST  2 VIEW COMPARISON:  Portable chest x-ray of February 05, 2016 FINDINGS: The lungs are mildly hypoinflated. There is bibasilar subsegmental atelectasis. There is no pleural effusion or pneumothorax. The heart is not enlarged. The pulmonary vascularity is not engorged. The trachea is midline. The bony thorax is unremarkable. IMPRESSION: Persistent hypoinflation with mild  bibasilar atelectasis. There is no CHF or definite pneumonia. Electronically Signed   By: David  Martinique M.D.   On: 02/06/2016 07:32   Ct Head Wo Contrast  02/05/2016  CLINICAL DATA:  Declining mental status EXAM: CT HEAD WITHOUT CONTRAST TECHNIQUE: Contiguous axial images were obtained from the base of the skull through the vertex without intravenous contrast. COMPARISON:  07/30/2006 FINDINGS: No mass effect, midline shift, or acute hemorrhage. Mild global atrophy. Cranium is intact. Nasal septum is deviated to the right. Mastoid air cells are clear. Visualized paranasal sinuses are clear. IMPRESSION: No acute intracranial pathology. Electronically Signed   By: Rodena Goldmann.D.  On: 02/05/2016 11:38   Dg Chest Port 1 View  02/05/2016  CLINICAL DATA:  Cough for a few days. Altered mental status with shortness of breath. History of COPD and hypertension. EXAM: PORTABLE CHEST 1 VIEW COMPARISON:  11/20/2011 and 01/14/2011. FINDINGS: 1125 hours. Persistent low lung volumes with chronic bibasilar atelectasis or scarring, similar to previous examinations. No edema, airspace disease, pleural effusion or pneumothorax. The heart size and mediastinal contours are stable. Patient is status post lower cervical fusion. IMPRESSION: Stable chronic bibasilar atelectasis or scarring. No acute cardiopulmonary process. Electronically Signed   By: Richardean Sale M.D.   On: 02/05/2016 11:44    DISCHARGE EXAMINATION: Filed Vitals:   02/06/16 1421 02/06/16 1809 02/06/16 2136 02/07/16 0530  BP:  144/64 139/75 134/79  Pulse: 72 59 69 64  Temp:   97.7 F (36.5 C) 97.7 F (36.5 C)  TempSrc:   Oral Oral  Resp: 18 18 16 18   Weight:      SpO2: 96% 96% 95% 96%   General appearance: alert, cooperative and no distress Resp: clear to auscultation bilaterally Cardio: regular rate and rhythm, S1, S2 normal, no murmur, click, rub or gallop GI: soft, non-tender; bowel sounds normal; no masses,  no organomegaly Extremities:  extremities normal, atraumatic, no cyanosis or edema  DISPOSITION: Home  Discharge Instructions    Call MD for:  difficulty breathing, headache or visual disturbances    Complete by:  As directed      Call MD for:  extreme fatigue    Complete by:  As directed      Call MD for:  persistant dizziness or light-headedness    Complete by:  As directed      Call MD for:  persistant nausea and vomiting    Complete by:  As directed      Call MD for:  severe uncontrolled pain    Complete by:  As directed      Call MD for:  temperature >100.4    Complete by:  As directed      Diet - low sodium heart healthy    Complete by:  As directed      Discharge instructions    Complete by:  As directed   Please follow up with your PCP early next week.  You were cared for by a hospitalist during your hospital stay. If you have any questions about your discharge medications or the care you received while you were in the hospital after you are discharged, you can call the unit and asked to speak with the hospitalist on call if the hospitalist that took care of you is not available. Once you are discharged, your primary care physician will handle any further medical issues. Please note that NO REFILLS for any discharge medications will be authorized once you are discharged, as it is imperative that you return to your primary care physician (or establish a relationship with a primary care physician if you do not have one) for your aftercare needs so that they can reassess your need for medications and monitor your lab values. If you do not have a primary care physician, you can call [phone] for a physician referral.     Increase activity slowly    Complete by:  As directed            ALLERGIES:  Allergies  Allergen Reactions  . Oxycodone-Acetaminophen Anaphylaxis  . Advair Diskus [Fluticasone-Salmeterol]   . Pentazocine Lactate Nausea Only and Other (See Comments)  Hallucinations     Discharge  Medication List as of 02/07/2016 10:48 AM    START taking these medications   Details  doxycycline (VIBRA-TABS) 100 MG tablet Take 1 tablet (100 mg total) by mouth every 12 (twelve) hours. For 6 more days., Starting 02/07/2016, Until Discontinued, Print    predniSONE (DELTASONE) 20 MG tablet Take 3 tablets once daily for 2 days, then take 2 tablets once daily for 3 days, then take 1 tablet once daily for 3 days, then STOP, Print      CONTINUE these medications which have NOT CHANGED   Details  albuterol (PROVENTIL HFA;VENTOLIN HFA) 108 (90 BASE) MCG/ACT inhaler Inhale 2 puffs into the lungs every 6 (six) hours as needed. Wheezing/shortness of breath , Until Discontinued, Historical Med    aspirin EC 81 MG tablet Take 81 mg by mouth daily.  , Until Discontinued, Historical Med    budesonide-formoterol (SYMBICORT) 80-4.5 MCG/ACT inhaler Inhale 2 puffs into the lungs 2 (two) times daily., Until Discontinued, Historical Med    clobetasol cream (TEMOVATE) [id number] % Apply 1 application topically every 14 (fourteen) days. 2 weeks of use then stop for 2 weeks, the repeat, Starting 12/18/2015, Until Discontinued, Historical Med    nitroGLYCERIN (NITROSTAT) 0.4 MG SL tablet Place 0.4 mg under the tongue every 5 (five) minutes as needed for chest pain., Until Discontinued, Historical Med    pantoprazole (PROTONIX) 40 MG tablet Take 40 mg by mouth daily.  , Until Discontinued, Historical Med    Tamsulosin HCl (FLOMAX) 0.4 MG CAPS Take 0.4 mg by mouth daily.  , Until Discontinued, Historical Med    telmisartan (MICARDIS) 40 MG tablet Take 40 mg by mouth daily., Starting 11/28/2015, Until Discontinued, Historical Med      STOP taking these medications     Pseudoeph-Doxylamine-DM-APAP (NYQUIL PO)        Follow-up Information    Follow up with Simona Huh, MD. Schedule an appointment as soon as possible for a visit in 5 days.   Specialty:  Family Medicine   Why:  post hospitalization follow up//  Office will be giving patient a call at home with an appointment    Contact information:   [place] [place] [address] [phone]       TOTAL DISCHARGE TIME: 35 minutes  [facility] Pager [phone]  02/07/2016, 2:53 PM

## 2016-02-08 LAB — CULTURE, BLOOD (ROUTINE X 2)

## 2016-02-10 LAB — CULTURE, BLOOD (ROUTINE X 2): Culture: NO GROWTH

## 2016-02-12 DIAGNOSIS — L940 Localized scleroderma [morphea]: Secondary | ICD-10-CM | POA: Diagnosis not present

## 2016-02-14 DIAGNOSIS — Z79899 Other long term (current) drug therapy: Secondary | ICD-10-CM | POA: Diagnosis not present

## 2016-02-14 DIAGNOSIS — J441 Chronic obstructive pulmonary disease with (acute) exacerbation: Secondary | ICD-10-CM | POA: Diagnosis not present

## 2016-02-27 DIAGNOSIS — L940 Localized scleroderma [morphea]: Secondary | ICD-10-CM | POA: Diagnosis not present

## 2016-02-29 ENCOUNTER — Ambulatory Visit (AMBULATORY_SURGERY_CENTER): Payer: Self-pay | Admitting: *Deleted

## 2016-02-29 VITALS — Ht 69.0 in | Wt 298.6 lb

## 2016-02-29 DIAGNOSIS — Z1211 Encounter for screening for malignant neoplasm of colon: Secondary | ICD-10-CM

## 2016-02-29 NOTE — Progress Notes (Signed)
No egg or soy allergy known to patient  No issues with past sedation with any surgeries  or procedures, no intubation problems  No diet pills per patient No home 02 use per patient  No blood thinners per patient  Pt denies issues with constipation  emmi declined by pt

## 2016-03-07 MED FILL — TELMISARTAN 40 MG TABLET: 40 | 90 days supply | Qty: 90 | Fill #1

## 2016-03-10 DIAGNOSIS — R799 Abnormal finding of blood chemistry, unspecified: Secondary | ICD-10-CM | POA: Diagnosis not present

## 2016-03-10 DIAGNOSIS — Z79899 Other long term (current) drug therapy: Secondary | ICD-10-CM | POA: Diagnosis not present

## 2016-03-10 DIAGNOSIS — M25561 Pain in right knee: Secondary | ICD-10-CM | POA: Diagnosis not present

## 2016-03-10 DIAGNOSIS — L940 Localized scleroderma [morphea]: Secondary | ICD-10-CM | POA: Diagnosis not present

## 2016-03-10 DIAGNOSIS — M25562 Pain in left knee: Secondary | ICD-10-CM | POA: Diagnosis not present

## 2016-03-10 DIAGNOSIS — M542 Cervicalgia: Secondary | ICD-10-CM | POA: Diagnosis not present

## 2016-03-10 DIAGNOSIS — M545 Low back pain: Secondary | ICD-10-CM | POA: Diagnosis not present

## 2016-03-12 ENCOUNTER — Encounter: Payer: Self-pay | Admitting: Internal Medicine

## 2016-03-12 ENCOUNTER — Ambulatory Visit (AMBULATORY_SURGERY_CENTER): Payer: 59 | Admitting: Internal Medicine

## 2016-03-12 VITALS — BP 108/68 | HR 89 | Temp 97.8°F | Resp 12 | Ht 69.0 in | Wt 298.0 lb

## 2016-03-12 DIAGNOSIS — Z1211 Encounter for screening for malignant neoplasm of colon: Secondary | ICD-10-CM | POA: Diagnosis not present

## 2016-03-12 DIAGNOSIS — D123 Benign neoplasm of transverse colon: Secondary | ICD-10-CM

## 2016-03-12 DIAGNOSIS — I10 Essential (primary) hypertension: Secondary | ICD-10-CM | POA: Diagnosis not present

## 2016-03-12 DIAGNOSIS — K219 Gastro-esophageal reflux disease without esophagitis: Secondary | ICD-10-CM | POA: Diagnosis not present

## 2016-03-12 DIAGNOSIS — J449 Chronic obstructive pulmonary disease, unspecified: Secondary | ICD-10-CM | POA: Diagnosis not present

## 2016-03-12 MED ORDER — SODIUM CHLORIDE 0.9 % IV SOLN: 500.0000 mL | INTRAVENOUS | Status: DC

## 2016-03-12 NOTE — Patient Instructions (Addendum)
YOU HAD AN ENDOSCOPIC PROCEDURE TODAY AT THE [facility]:   Refer to the procedure report that was given to you for any specific questions about what was found during the examination.  If the procedure report does not answer your questions, please call your gastroenterologist to clarify.  If you requested that your care partner not be given the details of your procedure findings, then the procedure report has been included in a sealed envelope for you to review at your convenience later.  YOU SHOULD EXPECT: Some feelings of bloating in the abdomen. Passage of more gas than usual.  Walking can help get rid of the air that was put into your GI tract during the procedure and reduce the bloating. If you had a lower endoscopy (such as a colonoscopy or flexible sigmoidoscopy) you may notice spotting of blood in your stool or on the toilet paper. If you underwent a bowel prep for your procedure, you may not have a normal bowel movement for a few days.  Please Note:  You might notice some irritation and congestion in your nose or some drainage.  This is from the oxygen used during your procedure.  There is no need for concern and it should clear up in a day or so.  SYMPTOMS TO REPORT IMMEDIATELY:   Following lower endoscopy (colonoscopy or flexible sigmoidoscopy):  Excessive amounts of blood in the stool  Significant tenderness or worsening of abdominal pains  Swelling of the abdomen that is new, acute  Fever of 100F or higher   For urgent or emergent issues, a gastroenterologist can be reached at any hour by calling [phone].   DIET: Your first meal following the procedure should be a small meal and then it is ok to progress to your normal diet. Heavy or fried foods are harder to digest and may make you feel nauseous or bloated.  Likewise, meals heavy in dairy and vegetables can increase bloating.  Drink plenty of fluids but you should avoid alcoholic beverages for 24  hours.  ACTIVITY:  You should plan to take it easy for the rest of today and you should NOT DRIVE or use heavy machinery until tomorrow (because of the sedation medicines used during the test).    FOLLOW UP: Our staff will call the number listed on your records the next business day following your procedure to check on you and address any questions or concerns that you may have regarding the information given to you following your procedure. If we do not reach you, we will leave a message.  However, if you are feeling well and you are not experiencing any problems, there is no need to return our call.  We will assume that you have returned to your regular daily activities without incident.  If any biopsies were taken you will be contacted by phone or by letter within the next 1-3 weeks.  Please call us at [phone] if you have not heard about the biopsies in 3 weeks.    SIGNATURES/CONFIDENTIALITY: You and/or your care partner have signed paperwork which will be entered into your electronic medical record.  These signatures attest to the fact that that the information above on your After Visit Summary has been reviewed and is understood.  Full responsibility of the confidentiality of this discharge information lies with you and/or your care-partner.     Handout was given to your care partner on polyps. You may resume your current medications today. Await biopsy results. Please call   if any questions or concerns.   

## 2016-03-12 NOTE — Op Note (Signed)
[facility] Patient Name: Shane Clark Procedure Date: 03/12/2016 9:23 AM MRN: [id number] Endoscopist: Gatha Mayer , MD Age: 65 Date of Birth: [birth year] Gender: Male Procedure:                Colonoscopy Indications:              Screening for colorectal malignant neoplasm Medicines:                Propofol per Anesthesia, Monitored Anesthesia Care Procedure:                Pre-Anesthesia Assessment:                           - Prior to the procedure, a History and Physical                            was performed, and patient medications and                            allergies were reviewed. The patient's tolerance of                            previous anesthesia was also reviewed. The risks                            and benefits of the procedure and the sedation                            options and risks were discussed with the patient.                            All questions were answered, and informed consent                            was obtained. Prior Anticoagulants: The patient                            last took aspirin on the day of the procedure. ASA                            Grade Assessment: III - A patient with severe                            systemic disease. After reviewing the risks and                            benefits, the patient was deemed in satisfactory                            condition to undergo the procedure.                           After obtaining informed consent, the colonoscope  was passed under direct vision. Throughout the                            procedure, the patient's blood pressure, pulse, and                            oxygen saturations were monitored continuously. The                            Model CF-HQ190L [id number]) scope was introduced                            through the anus and advanced to the the cecum,                            identified by appendiceal orifice and  ileocecal                            valve. The colonoscopy was performed without                            difficulty. The patient tolerated the procedure                            well. The quality of the bowel preparation was                            excellent. The bowel preparation used was Miralax.                            the ileocecal valve was photographed. Appendiceal                            orifice seen but photo omitted. Scope In: 9:32:21 AM Scope Out: 9:47:10 AM Scope Withdrawal Time: 0 hours 10 minutes 46 seconds  Total Procedure Duration: 0 hours 14 minutes 49 seconds  Findings:                 The perianal and digital rectal examinations were                            normal.                           Two sessile polyps were found in the transverse                            colon. The polyps were 2 to 3 mm in size. These                            polyps were removed with a cold snare. Resection                            and retrieval were complete. Verification of  patient identification for the specimen was done.                            Estimated blood loss was minimal.                           The exam was otherwise without abnormality on                            direct and retroflexion views. Complications:            No immediate complications. Estimated Blood Loss:     Estimated blood loss was minimal. Impression:               - Two 2 to 3 mm polyps in the transverse colon,                            removed with a cold snare. Resected and retrieved.                           - The examination was otherwise normal on direct                            and retroflexion views. Recommendation:           - Patient has a contact number available for                            emergencies. The signs and symptoms of potential                            delayed complications were discussed with the                             patient. Return to normal activities tomorrow.                            Written discharge instructions were provided to the                            patient.                           - Resume previous diet.                           - Continue present medications.                           - Repeat colonoscopy is recommended. The                            colonoscopy date will be determined after pathology                            results from today's exam become available for  review. Gatha Mayer, MD 03/12/2016 9:56:14 AM This report has been signed electronically. CC Letter to:             Gaynelle Arabian, MD

## 2016-03-12 NOTE — Progress Notes (Signed)
No problems noted in the recovery room. maw 

## 2016-03-12 NOTE — Progress Notes (Signed)
Report given to PACU RN, vss 

## 2016-03-12 NOTE — Progress Notes (Signed)
Called to room to assist during endoscopic procedure.  Patient ID and intended procedure confirmed with present staff. Received instructions for my participation in the procedure from the performing physician.  

## 2016-03-13 ENCOUNTER — Telehealth: Payer: Self-pay

## 2016-03-13 NOTE — Telephone Encounter (Signed)
  Follow up Call-  Call back number 03/12/2016  Post procedure Call Back phone  # [phone]  Permission to leave phone message Yes     Patient questions:  Do you have a fever, pain , or abdominal swelling? No. Pain Score  0 *  Have you tolerated food without any problems? Yes.    Have you been able to return to your normal activities? Yes.    Do you have any questions about your discharge instructions: Diet   No. Medications  No. Follow up visit  No.  Do you have questions or concerns about your Care? No.  Actions: * If pain score is 4 or above: No action needed, pain <4.

## 2016-03-22 ENCOUNTER — Encounter: Payer: Self-pay | Admitting: Internal Medicine

## 2016-03-22 DIAGNOSIS — Z86010 Personal history of colonic polyps: Secondary | ICD-10-CM

## 2016-03-22 DIAGNOSIS — Z860101 Personal history of adenomatous and serrated colon polyps: Secondary | ICD-10-CM

## 2016-03-22 HISTORY — DX: Personal history of colonic polyps: Z86.010

## 2016-03-22 HISTORY — DX: Personal history of adenomatous and serrated colon polyps: Z86.0101

## 2016-03-22 NOTE — Progress Notes (Signed)
Quick Note:  2 diminutive adenomas Recall colonoscopy 2022 ______

## 2016-04-08 MED FILL — TAMSULOSIN HCL 0.4 MG CAP: 0.4 | 90 days supply | Qty: 90 | Fill #1

## 2016-04-08 MED FILL — SYMBICORT 80-4.5 MCG INH: 80-4.5 | 90 days supply | Qty: 31 | Fill #1

## 2016-04-08 MED FILL — PANTOPRAZOLE SOD DR 40 MG T: 40 | 90 days supply | Qty: 90 | Fill #1

## 2016-04-08 NOTE — Telephone Encounter (Signed)
Patient has already had colon with Dr. Carlean Purl.

## 2016-05-14 DIAGNOSIS — R799 Abnormal finding of blood chemistry, unspecified: Secondary | ICD-10-CM | POA: Diagnosis not present

## 2016-05-14 DIAGNOSIS — L409 Psoriasis, unspecified: Secondary | ICD-10-CM | POA: Diagnosis not present

## 2016-05-14 DIAGNOSIS — R700 Elevated erythrocyte sedimentation rate: Secondary | ICD-10-CM | POA: Diagnosis not present

## 2016-05-14 DIAGNOSIS — L940 Localized scleroderma [morphea]: Secondary | ICD-10-CM | POA: Diagnosis not present

## 2016-06-09 MED FILL — TELMISARTAN 40 MG TABLET: 40 | 90 days supply | Qty: 90 | Fill #2

## 2016-06-16 MED FILL — VENTOLIN HFA 90 MCG INHALER: 108 (90 BAS | 17 days supply | Qty: 18 | Fill #0

## 2016-07-08 MED FILL — PANTOPRAZOLE SOD DR 40 MG T: 40 | 90 days supply | Qty: 90 | Fill #2

## 2016-07-08 MED FILL — TAMSULOSIN HCL 0.4 MG CAP: 0.4 | 90 days supply | Qty: 90 | Fill #2

## 2016-07-23 MED FILL — SYMBICORT 80-4.5 MCG INH: 80-4.5 | 90 days supply | Qty: 31 | Fill #2

## 2016-07-31 MED FILL — CLOBETASOL 0.05% CREAM: 0.05 | 20 days supply | Qty: 60 | Fill #1

## 2016-09-01 DIAGNOSIS — J069 Acute upper respiratory infection, unspecified: Secondary | ICD-10-CM | POA: Diagnosis not present

## 2016-09-01 MED FILL — HYDROCODONE-HOMATROPINE SYR: 5-1.5 | 6 days supply | Qty: 120 | Fill #0

## 2016-09-01 MED FILL — BENZONATATE 100 MG CAPSULE: 100 | 10 days supply | Qty: 60 | Fill #0

## 2016-09-05 MED FILL — TELMISARTAN 40 MG TABLET: 40 | 90 days supply | Qty: 90 | Fill #3

## 2016-09-22 DIAGNOSIS — N401 Enlarged prostate with lower urinary tract symptoms: Secondary | ICD-10-CM | POA: Diagnosis not present

## 2016-09-22 DIAGNOSIS — R351 Nocturia: Secondary | ICD-10-CM | POA: Diagnosis not present

## 2016-10-02 MED FILL — TAMSULOSIN HCL 0.4 MG CAP: 0.4 | 90 days supply | Qty: 90 | Fill #3

## 2016-10-02 MED FILL — PANTOPRAZOLE SOD DR 40 MG T: 40 | 90 days supply | Qty: 90 | Fill #3

## 2016-10-28 MED FILL — SYMBICORT 80-4.5 MCG INH: 80-4.5 | 90 days supply | Qty: 31 | Fill #3

## 2016-11-27 MED FILL — TELMISARTAN 40 MG TABLET: 40 | 90 days supply | Qty: 90 | Fill #0

## 2016-11-27 MED FILL — VENTOLIN HFA 90 MCG INHALER: 108 (90 BAS | 17 days supply | Qty: 18 | Fill #1

## 2017-01-05 MED FILL — PANTOPRAZOLE SOD DR 40 MG T: 40 | 90 days supply | Qty: 90 | Fill #0

## 2017-01-06 MED FILL — TAMSULOSIN HCL 0.4 MG CAP: 0.4 | 90 days supply | Qty: 90 | Fill #0

## 2017-01-15 ENCOUNTER — Ambulatory Visit (INDEPENDENT_AMBULATORY_CARE_PROVIDER_SITE_OTHER): Payer: Medicare Other

## 2017-01-15 ENCOUNTER — Ambulatory Visit (INDEPENDENT_AMBULATORY_CARE_PROVIDER_SITE_OTHER): Payer: Medicare Other | Admitting: Orthopaedic Surgery

## 2017-01-15 DIAGNOSIS — M25512 Pain in left shoulder: Secondary | ICD-10-CM

## 2017-01-15 NOTE — Progress Notes (Signed)
Office Visit Note   Patient: Shane Clark           Date of Birth: 03/09/1951           MRN: [id number] Visit Date: 01/15/2017              Requested by: Gaynelle Arabian, MD [address] [place], [place] [address] PCP: Simona Huh, MD   Assessment & Plan: Visit Diagnoses:  1. Acute pain of left shoulder     Plan: MRI left shoulder to evaluate for all thickness rotator cuff tear. Follow-up after the MRI  Follow-Up Instructions: Return in about 10 days (around 01/25/2017).   Orders:  Orders Placed This Encounter  Procedures  . XR Shoulder Left  . MR Shoulder Left w/o contrast   No orders of the defined types were placed in this encounter.     Procedures: No procedures performed   Clinical Data: No additional findings.   Subjective: Chief Complaint  Patient presents with  . Left Shoulder - Pain    Patient is a 66 year old gentleman who has left shoulder pain for 2 weeks when he was doing something and felt a pop in his left shoulder. Since that he's had very painful movement of his shoulder and use of his arm. The pain radiates up the neck but denies radicular symptoms. He endorses burning pain. Denies constitutional symptoms    Review of Systems Complete review of systems negative except for history of present illness  Objective: Vital Signs: There were no vitals taken for this visit.  Physical Exam  Constitutional: He is oriented to person, place, and time. He appears well-developed and well-nourished.  HENT:  Head: Normocephalic and atraumatic.  Eyes: Pupils are equal, round, and reactive to light.  Neck: Neck supple.  Pulmonary/Chest: Effort normal.  Abdominal: Soft.  Musculoskeletal: Normal range of motion.  Neurological: He is alert and oriented to person, place, and time.  Skin: Skin is warm.  Psychiatric: He has a normal mood and affect. His behavior is normal. Judgment and thought content normal.  Nursing note and vitals  reviewed.   Ortho Exam Exam of the left shoulder shows positive empty can, positive Neer positive Hawkins and positive drop arm test. Specialty Comments:  No specialty comments available.  Imaging: Xr Shoulder Left  Result Date: 01/15/2017 No acute findings.  No DJD    PMFS History: Patient Active Problem List   Diagnosis Date Noted  . Hx of adenomatous colonic polyps 03/22/2016  . Essential hypertension 02/05/2016  . COPD (chronic obstructive pulmonary disease) ([place]) 11/21/2011  . Diastolic dysfunction [id number]  . BPH (benign prostatic hyperplasia) 11/21/2011  . Hypotension 11/20/2011  . HYPERTENSION, BENIGN 12/09/2010   Past Medical History:  Diagnosis Date  . Allergy   . Arthritis   . Asthma   . BPH (benign prostatic hyperplasia)   . Chronic back pain    "all over"  . COPD (chronic obstructive pulmonary disease) ([place])   . GERD (gastroesophageal reflux disease)   . Hx of adenomatous colonic polyps 03/22/2016  . Hypertension   . Influenza 11/20/2011  . Sepsis ([place]) 02/05/2016    Family History  Problem Relation Age of Onset  . Coronary artery disease Father   . Diabetes type II Other   . Pancreatic cancer Brother   . Stroke Brother   . Heart disease Brother   . Colon cancer Neg Hx   . Colon polyps Neg Hx   . Rectal cancer Neg Hx   .  Stomach cancer Neg Hx     Past Surgical History:  Procedure Laterality Date  . ABDOMINAL HERNIA REPAIR    . ANTERIOR CERVICAL DECOMP/DISCECTOMY FUSION     C6-8  . APPENDECTOMY  1960s  . BACK SURGERY    . COLONOSCOPY    . HERNIA REPAIR    . INSERTION DISTRACTION DEVICE LUMBAR POSTERIOR SPINOUS PROCESS    . KNEE ARTHROSCOPY Left   . SMALL BOWEL REPAIR     Social History   Occupational History  . Not on file.   Social History Main Topics  . Smoking status: Former Smoker    Packs/day: 0.12    Years: 20.00    Types: Cigarettes  . Smokeless tobacco: Former Systems developer     Comment: "quit smoking cigarettes in the 1980s"  .  Alcohol use No  . Drug use: No  . Sexual activity: Yes

## 2017-01-23 ENCOUNTER — Telehealth (INDEPENDENT_AMBULATORY_CARE_PROVIDER_SITE_OTHER): Payer: Self-pay | Admitting: Orthopaedic Surgery

## 2017-01-23 ENCOUNTER — Ambulatory Visit
Admission: RE | Admit: 2017-01-23 | Discharge: 2017-01-23 | Disposition: A | Discharge status: Home / Self Care | Payer: Medicare Other | Source: Ambulatory Visit | Attending: Orthopaedic Surgery | Admitting: Orthopaedic Surgery

## 2017-01-23 DIAGNOSIS — M25512 Pain in left shoulder: Secondary | ICD-10-CM

## 2017-01-23 NOTE — Telephone Encounter (Signed)
Patient unable to do the MRI on L shoulder this morning.  Patient states he is claustrophobic.  Will need open MRI.

## 2017-01-26 ENCOUNTER — Ambulatory Visit (INDEPENDENT_AMBULATORY_CARE_PROVIDER_SITE_OTHER): Payer: Medicare Other | Admitting: Orthopaedic Surgery

## 2017-01-26 NOTE — Telephone Encounter (Signed)
Noted, working on it

## 2017-01-26 NOTE — Telephone Encounter (Signed)
Can we get him an open unit instead.

## 2017-01-27 NOTE — Telephone Encounter (Signed)
Pt has appt scheduled at [facility] on Thursday Mar 1 at 4:30pm, pt is aware of appt

## 2017-02-05 ENCOUNTER — Encounter (INDEPENDENT_AMBULATORY_CARE_PROVIDER_SITE_OTHER): Payer: Self-pay | Admitting: Orthopaedic Surgery

## 2017-02-05 ENCOUNTER — Ambulatory Visit (INDEPENDENT_AMBULATORY_CARE_PROVIDER_SITE_OTHER): Payer: Medicare Other | Admitting: Orthopaedic Surgery

## 2017-02-05 DIAGNOSIS — M7542 Impingement syndrome of left shoulder: Secondary | ICD-10-CM | POA: Diagnosis not present

## 2017-02-05 NOTE — Progress Notes (Signed)
Office Visit Note   Patient: Shane Clark           Date of Birth: 03/05/1951           MRN: [id number] Visit Date: 02/05/2017              Requested by: Gaynelle Arabian, MD [address] [place], [place] [address] PCP: Simona Huh, MD   Assessment & Plan: Visit Diagnoses:  1. Impingement syndrome of left shoulder     Plan: MRI from [facility] was reviewed shows rotator cuff tendinosis on the bursal side. He does have severe degenerative changes of the acromioclavicular joint with impingement of the rotator cuff. At this point we discussed treatment options including subacromial injection versus surgical treatment as a more permanent solution. He understands risks benefits alternatives to surgery and wishes to proceed.  Follow-Up Instructions: Return for 2 week postop visit.   Orders:  No orders of the defined types were placed in this encounter.  No orders of the defined types were placed in this encounter.     Procedures: No procedures performed   Clinical Data: No additional findings.   Subjective: Chief Complaint  Patient presents with  . Left Shoulder - Pain    Patient comes in today for his left shoulder pain. He had an MRI of his left shoulder [facility]. He continues to have pain. He's been dealing with this for several months now.    Review of Systems  Constitutional: Negative.   All other systems reviewed and are negative.    Objective: Vital Signs: There were no vitals taken for this visit.  Physical Exam  Constitutional: He is oriented to person, place, and time. He appears well-developed and well-nourished.  Pulmonary/Chest: Effort normal.  Abdominal: Soft.  Neurological: He is alert and oriented to person, place, and time.  Skin: Skin is warm.  Psychiatric: He has a normal mood and affect. His behavior is normal. Judgment and thought content normal.  Nursing note and vitals reviewed.   Ortho Exam Exam of his  left shoulder shows a positive Hawkins and Neer impingement. Rotator cuff testing is grossly intact but with mild pain. Positive cross adduction sign. Acromioclavicular joint is tender Specialty Comments:  No specialty comments available.  Imaging: No results found.   PMFS History: Patient Active Problem List   Diagnosis Date Noted  . Impingement syndrome of left shoulder 02/05/2017  . Hx of adenomatous colonic polyps 03/22/2016  . Essential hypertension 02/05/2016  . COPD (chronic obstructive pulmonary disease) ([place]) 11/21/2011  . Diastolic dysfunction 19/14/7829  . BPH (benign prostatic hyperplasia) 11/21/2011  . Hypotension 11/20/2011  . HYPERTENSION, BENIGN 12/09/2010   Past Medical History:  Diagnosis Date  . Allergy   . Arthritis   . Asthma   . BPH (benign prostatic hyperplasia)   . Chronic back pain    "all over"  . COPD (chronic obstructive pulmonary disease) ([place])   . GERD (gastroesophageal reflux disease)   . Hx of adenomatous colonic polyps 03/22/2016  . Hypertension   . Influenza 11/20/2011  . Sepsis ([place]) 02/05/2016    Family History  Problem Relation Age of Onset  . Coronary artery disease Father   . Diabetes type II Other   . Pancreatic cancer Brother   . Stroke Brother   . Heart disease Brother   . Colon cancer Neg Hx   . Colon polyps Neg Hx   . Rectal cancer Neg Hx   . Stomach cancer  Neg Hx     Past Surgical History:  Procedure Laterality Date  . ABDOMINAL HERNIA REPAIR    . ANTERIOR CERVICAL DECOMP/DISCECTOMY FUSION     C6-8  . APPENDECTOMY  1960s  . BACK SURGERY    . COLONOSCOPY    . HERNIA REPAIR    . INSERTION DISTRACTION DEVICE LUMBAR POSTERIOR SPINOUS PROCESS    . KNEE ARTHROSCOPY Left   . SMALL BOWEL REPAIR     Social History   Occupational History  . Not on file.   Social History Main Topics  . Smoking status: Former Smoker    Packs/day: 0.12    Years: 20.00    Types: Cigarettes  . Smokeless tobacco: Former Systems developer     Comment:  "quit smoking cigarettes in the 1980s"  . Alcohol use No  . Drug use: No  . Sexual activity: Yes

## 2017-02-06 ENCOUNTER — Other Ambulatory Visit (INDEPENDENT_AMBULATORY_CARE_PROVIDER_SITE_OTHER): Payer: Self-pay | Admitting: Orthopaedic Surgery

## 2017-02-06 DIAGNOSIS — M7542 Impingement syndrome of left shoulder: Secondary | ICD-10-CM

## 2017-02-09 ENCOUNTER — Encounter (HOSPITAL_BASED_OUTPATIENT_CLINIC_OR_DEPARTMENT_OTHER): Payer: Self-pay | Admitting: *Deleted

## 2017-02-09 NOTE — Progress Notes (Signed)
   02/09/17 1109  OBSTRUCTIVE SLEEP APNEA  Have you ever been diagnosed with sleep apnea through a sleep study? No  Do you snore loudly (loud enough to be heard through closed doors)?  0  Do you often feel tired, fatigued, or sleepy during the daytime (such as falling asleep during driving or talking to someone)? 0  Has anyone observed you stop breathing during your sleep? 0  Do you have, or are you being treated for high blood pressure? 1  BMI more than 35 kg/m2? 1  Age > 50 (1-yes) 1  Neck circumference greater than:Male 16 inches or larger, Male 17inches or larger? 1  Male Gender (Yes=1) 1  Obstructive Sleep Apnea Score 5

## 2017-02-10 ENCOUNTER — Encounter (HOSPITAL_BASED_OUTPATIENT_CLINIC_OR_DEPARTMENT_OTHER)
Admission: RE | Admit: 2017-02-10 | Discharge: 2017-02-10 | Disposition: A | Discharge status: Home / Self Care | Payer: Medicare Other | Source: Ambulatory Visit | Attending: Orthopaedic Surgery | Admitting: Orthopaedic Surgery

## 2017-02-10 DIAGNOSIS — S43432A Superior glenoid labrum lesion of left shoulder, initial encounter: Secondary | ICD-10-CM | POA: Diagnosis not present

## 2017-02-10 DIAGNOSIS — Z87891 Personal history of nicotine dependence: Secondary | ICD-10-CM | POA: Diagnosis not present

## 2017-02-10 DIAGNOSIS — Z7982 Long term (current) use of aspirin: Secondary | ICD-10-CM | POA: Diagnosis not present

## 2017-02-10 DIAGNOSIS — Z79899 Other long term (current) drug therapy: Secondary | ICD-10-CM | POA: Diagnosis not present

## 2017-02-10 DIAGNOSIS — M7542 Impingement syndrome of left shoulder: Secondary | ICD-10-CM | POA: Diagnosis present

## 2017-02-10 DIAGNOSIS — I10 Essential (primary) hypertension: Secondary | ICD-10-CM | POA: Diagnosis not present

## 2017-02-10 DIAGNOSIS — N400 Enlarged prostate without lower urinary tract symptoms: Secondary | ICD-10-CM | POA: Diagnosis not present

## 2017-02-10 DIAGNOSIS — J449 Chronic obstructive pulmonary disease, unspecified: Secondary | ICD-10-CM | POA: Diagnosis not present

## 2017-02-10 DIAGNOSIS — X58XXXA Exposure to other specified factors, initial encounter: Secondary | ICD-10-CM | POA: Diagnosis not present

## 2017-02-10 DIAGNOSIS — M7552 Bursitis of left shoulder: Secondary | ICD-10-CM | POA: Diagnosis not present

## 2017-02-10 NOTE — Progress Notes (Signed)
EKG done and reviewed by Dr Gifford Shave. [place] for surgery tomorrow with Dr. Erlinda Hong.

## 2017-02-10 NOTE — Anesthesia Preprocedure Evaluation (Signed)
Anesthesia Evaluation  Patient identified by MRN, date of birth, ID band Patient awake    Reviewed: Allergy & Precautions, H&P , Patient's Chart, lab work & pertinent test results, reviewed documented beta blocker date and time   Airway Mallampati: II  TM Distance: >3 FB Neck ROM: full    Dental no notable dental hx.    Pulmonary asthma , former smoker,    Pulmonary exam normal breath sounds clear to auscultation       Cardiovascular hypertension,  Rhythm:regular Rate:Normal     Neuro/Psych    GI/Hepatic   Endo/Other    Renal/GU      Musculoskeletal   Abdominal   Peds  Hematology   Anesthesia Other Findings   Reproductive/Obstetrics                             Anesthesia Physical Anesthesia Plan  ASA: III  Anesthesia Plan: General   Post-op Pain Management:    Induction: Intravenous  Airway Management Planned: Oral ETT  Additional Equipment:   Intra-op Plan:   Post-operative Plan: Extubation in OR  Informed Consent: I have reviewed the patients History and Physical, chart, labs and discussed the procedure including the risks, benefits and alternatives for the proposed anesthesia with the patient or authorized representative who has indicated his/her understanding and acceptance.   Dental Advisory Given and Dental advisory given  Plan Discussed with: CRNA and Surgeon  Anesthesia Plan Comments: (May consider Glidescope; airway dependent   Discussed general anesthesia, including possible nausea, instrumentation of airway, sore throat,pulmonary aspiration, etc. I asked if the were any outstanding questions, or  concerns before we proceeded.)        Anesthesia Quick Evaluation

## 2017-02-11 ENCOUNTER — Ambulatory Visit (HOSPITAL_BASED_OUTPATIENT_CLINIC_OR_DEPARTMENT_OTHER)
Admission: RE | Admit: 2017-02-11 | Discharge: 2017-02-11 | Disposition: A | Discharge status: Home / Self Care | Payer: Medicare Other | Source: Ambulatory Visit | Attending: Orthopaedic Surgery | Admitting: Orthopaedic Surgery

## 2017-02-11 ENCOUNTER — Encounter (HOSPITAL_BASED_OUTPATIENT_CLINIC_OR_DEPARTMENT_OTHER)
Admission: RE | Disposition: A | Discharge status: Home / Self Care | Payer: Self-pay | Source: Ambulatory Visit | Attending: Orthopaedic Surgery

## 2017-02-11 ENCOUNTER — Ambulatory Visit (HOSPITAL_BASED_OUTPATIENT_CLINIC_OR_DEPARTMENT_OTHER): Payer: Medicare Other | Admitting: Anesthesiology

## 2017-02-11 ENCOUNTER — Encounter (HOSPITAL_BASED_OUTPATIENT_CLINIC_OR_DEPARTMENT_OTHER): Payer: Self-pay | Admitting: Anesthesiology

## 2017-02-11 DIAGNOSIS — J449 Chronic obstructive pulmonary disease, unspecified: Secondary | ICD-10-CM | POA: Insufficient documentation

## 2017-02-11 DIAGNOSIS — M7542 Impingement syndrome of left shoulder: Secondary | ICD-10-CM | POA: Diagnosis not present

## 2017-02-11 DIAGNOSIS — S43432A Superior glenoid labrum lesion of left shoulder, initial encounter: Secondary | ICD-10-CM | POA: Diagnosis not present

## 2017-02-11 DIAGNOSIS — M7552 Bursitis of left shoulder: Secondary | ICD-10-CM | POA: Insufficient documentation

## 2017-02-11 DIAGNOSIS — I10 Essential (primary) hypertension: Secondary | ICD-10-CM | POA: Insufficient documentation

## 2017-02-11 DIAGNOSIS — X58XXXA Exposure to other specified factors, initial encounter: Secondary | ICD-10-CM | POA: Insufficient documentation

## 2017-02-11 DIAGNOSIS — Z79899 Other long term (current) drug therapy: Secondary | ICD-10-CM | POA: Insufficient documentation

## 2017-02-11 DIAGNOSIS — Z87891 Personal history of nicotine dependence: Secondary | ICD-10-CM | POA: Insufficient documentation

## 2017-02-11 DIAGNOSIS — S43439A Superior glenoid labrum lesion of unspecified shoulder, initial encounter: Secondary | ICD-10-CM

## 2017-02-11 DIAGNOSIS — N400 Enlarged prostate without lower urinary tract symptoms: Secondary | ICD-10-CM | POA: Diagnosis not present

## 2017-02-11 DIAGNOSIS — Z7982 Long term (current) use of aspirin: Secondary | ICD-10-CM | POA: Insufficient documentation

## 2017-02-11 HISTORY — PX: SHOULDER SURGERY: SHX246

## 2017-02-11 SURGERY — SHOULDER ARTHROSCOPY WITH SUBACROMIAL DECOMPRESSION AND DISTAL CLAVICLE EXCISION
Anesthesia: General | Site: Shoulder | Laterality: Left

## 2017-02-11 MED ORDER — EPHEDRINE 5 MG/ML INJ
INTRAVENOUS | Status: AC
  Filled 2017-02-11: qty 10

## 2017-02-11 MED ORDER — FENTANYL CITRATE (PF) 100 MCG/2ML IJ SOLN
INTRAMUSCULAR | Status: AC
  Filled 2017-02-11: qty 2

## 2017-02-11 MED ORDER — BUPIVACAINE HCL (PF) 0.5 % IJ SOLN
INTRAMUSCULAR | Status: AC
  Filled 2017-02-11: qty 30

## 2017-02-11 MED ORDER — MIDAZOLAM HCL 2 MG/2ML IJ SOLN
INTRAMUSCULAR | Status: AC
  Filled 2017-02-11: qty 2

## 2017-02-11 MED ORDER — SENNOSIDES-DOCUSATE SODIUM 8.6-50 MG PO TABS: 1.0000 | ORAL_TABLET | Freq: Every evening | ORAL | 1 refills | Status: DC | PRN

## 2017-02-11 MED ORDER — FENTANYL CITRATE (PF) 100 MCG/2ML IJ SOLN
50.0000 ug | INTRAMUSCULAR | Status: DC | PRN
  Administered 2017-02-11: 50 ug via INTRAVENOUS

## 2017-02-11 MED ORDER — GLYCOPYRROLATE 0.2 MG/ML IJ SOLN
INTRAMUSCULAR | Status: DC | PRN
  Administered 2017-02-11: 0.2 mg via INTRAVENOUS

## 2017-02-11 MED ORDER — SUCCINYLCHOLINE CHLORIDE 20 MG/ML IJ SOLN
INTRAMUSCULAR | Status: DC | PRN
  Administered 2017-02-11: 50 mg via INTRAVENOUS

## 2017-02-11 MED ORDER — HYDROCODONE-ACETAMINOPHEN 7.5-325 MG PO TABS: 1.0000 | ORAL_TABLET | Freq: Four times a day (QID) | ORAL | 0 refills | Status: DC | PRN

## 2017-02-11 MED ORDER — ONDANSETRON HCL 4 MG/2ML IJ SOLN
INTRAMUSCULAR | Status: AC
  Filled 2017-02-11: qty 2

## 2017-02-11 MED ORDER — PHENYLEPHRINE 40 MCG/ML (10ML) SYRINGE FOR IV PUSH (FOR BLOOD PRESSURE SUPPORT)
PREFILLED_SYRINGE | INTRAVENOUS | Status: AC
  Filled 2017-02-11: qty 10

## 2017-02-11 MED ORDER — CEFAZOLIN SODIUM-DEXTROSE 2-4 GM/100ML-% IV SOLN
INTRAVENOUS | Status: AC
  Filled 2017-02-11: qty 100

## 2017-02-11 MED ORDER — SUCCINYLCHOLINE CHLORIDE 200 MG/10ML IV SOSY
PREFILLED_SYRINGE | INTRAVENOUS | Status: AC
  Filled 2017-02-11: qty 10

## 2017-02-11 MED ORDER — DEXAMETHASONE SODIUM PHOSPHATE 10 MG/ML IJ SOLN
INTRAMUSCULAR | Status: AC
  Filled 2017-02-11: qty 1

## 2017-02-11 MED ORDER — FENTANYL CITRATE (PF) 100 MCG/2ML IJ SOLN
25.0000 ug | INTRAMUSCULAR | Status: DC | PRN
Start: 2017-02-11 — End: 2017-02-11

## 2017-02-11 MED ORDER — MIDAZOLAM HCL 5 MG/5ML IJ SOLN
INTRAMUSCULAR | Status: DC | PRN
  Administered 2017-02-11: 2 mg via INTRAVENOUS

## 2017-02-11 MED ORDER — PROPOFOL 10 MG/ML IV BOLUS
INTRAVENOUS | Status: AC
  Filled 2017-02-11: qty 20

## 2017-02-11 MED ORDER — ONDANSETRON HCL 4 MG/2ML IJ SOLN
INTRAMUSCULAR | Status: DC | PRN
  Administered 2017-02-11: 4 mg via INTRAVENOUS

## 2017-02-11 MED ORDER — EPINEPHRINE 30 MG/30ML IJ SOLN
INTRAMUSCULAR | Status: AC
  Filled 2017-02-11: qty 1

## 2017-02-11 MED ORDER — CEFAZOLIN SODIUM-DEXTROSE 2-4 GM/100ML-% IV SOLN
2.0000 g | INTRAVENOUS | Status: AC
  Administered 2017-02-11: 3 g via INTRAVENOUS

## 2017-02-11 MED ORDER — BUPIVACAINE HCL (PF) 0.5 % IJ SOLN: INTRAMUSCULAR | Status: DC | PRN

## 2017-02-11 MED ORDER — PROPOFOL 500 MG/50ML IV EMUL
INTRAVENOUS | Status: AC
  Filled 2017-02-11: qty 50

## 2017-02-11 MED ORDER — PHENYLEPHRINE HCL 10 MG/ML IJ SOLN
INTRAVENOUS | Status: DC | PRN
  Administered 2017-02-11: 50 ug/min via INTRAVENOUS

## 2017-02-11 MED ORDER — LIDOCAINE 2% (20 MG/ML) 5 ML SYRINGE
INTRAMUSCULAR | Status: AC
  Filled 2017-02-11: qty 5

## 2017-02-11 MED ORDER — SCOPOLAMINE 1 MG/3DAYS TD PT72: 1.0000 | MEDICATED_PATCH | Freq: Once | TRANSDERMAL | Status: DC | PRN

## 2017-02-11 MED ORDER — PHENYLEPHRINE HCL 10 MG/ML IJ SOLN
INTRAMUSCULAR | Status: AC
  Filled 2017-02-11: qty 1

## 2017-02-11 MED ORDER — ACETAMINOPHEN 160 MG/5ML PO SOLN
1000.0000 mg | Freq: Four times a day (QID) | ORAL | Status: DC | PRN
  Administered 2017-02-11: 1000 mg via ORAL

## 2017-02-11 MED ORDER — BUPIVACAINE HCL (PF) 0.5 % IJ SOLN
INTRAMUSCULAR | Status: DC | PRN
  Administered 2017-02-11: 30 mL via PERINEURAL

## 2017-02-11 MED ORDER — FENTANYL CITRATE (PF) 100 MCG/2ML IJ SOLN
INTRAMUSCULAR | Status: DC | PRN
  Administered 2017-02-11: 50 ug via INTRAVENOUS

## 2017-02-11 MED ORDER — BUPIVACAINE HCL (PF) 0.25 % IJ SOLN
INTRAMUSCULAR | Status: AC
  Filled 2017-02-11: qty 30

## 2017-02-11 MED ORDER — ONDANSETRON HCL 4 MG PO TABS: 4.0000 mg | ORAL_TABLET | Freq: Three times a day (TID) | ORAL | 0 refills | Status: DC | PRN

## 2017-02-11 MED ORDER — EPHEDRINE SULFATE 50 MG/ML IJ SOLN
INTRAMUSCULAR | Status: DC | PRN
  Administered 2017-02-11: 10 mg via INTRAVENOUS

## 2017-02-11 MED ORDER — PROPOFOL 10 MG/ML IV BOLUS
INTRAVENOUS | Status: DC | PRN
  Administered 2017-02-11: 200 mg via INTRAVENOUS

## 2017-02-11 MED ORDER — PHENYLEPHRINE HCL 10 MG/ML IJ SOLN
INTRAMUSCULAR | Status: DC | PRN
  Administered 2017-02-11 (×3): 160 ug via INTRAVENOUS

## 2017-02-11 MED ORDER — PROMETHAZINE HCL 25 MG PO TABS: 25.0000 mg | ORAL_TABLET | Freq: Four times a day (QID) | ORAL | 1 refills | Status: DC | PRN

## 2017-02-11 MED ORDER — DEXAMETHASONE SODIUM PHOSPHATE 4 MG/ML IJ SOLN
INTRAMUSCULAR | Status: DC | PRN
  Administered 2017-02-11: 10 mg via INTRAVENOUS

## 2017-02-11 MED ORDER — LACTATED RINGERS IV SOLN
INTRAVENOUS | Status: DC
  Administered 2017-02-11 (×3): via INTRAVENOUS

## 2017-02-11 MED ORDER — LIDOCAINE HCL (CARDIAC) 20 MG/ML IV SOLN
INTRAVENOUS | Status: DC | PRN
  Administered 2017-02-11: 30 mg via INTRAVENOUS

## 2017-02-11 MED ORDER — MIDAZOLAM HCL 2 MG/2ML IJ SOLN
1.0000 mg | INTRAMUSCULAR | Status: DC | PRN
  Administered 2017-02-11: 2 mg via INTRAVENOUS

## 2017-02-11 MED ORDER — METHOCARBAMOL 750 MG PO TABS: 750.0000 mg | ORAL_TABLET | Freq: Two times a day (BID) | ORAL | 0 refills | Status: DC | PRN

## 2017-02-11 MED ORDER — CEFAZOLIN IN D5W 1 GM/50ML IV SOLN
INTRAVENOUS | Status: AC
  Filled 2017-02-11: qty 50

## 2017-02-11 MED FILL — METHOCARBAMOL 750 MG TABLET: 750 | 30 days supply | Qty: 60 | Fill #0

## 2017-02-11 MED FILL — ONDANSETRON HCL 4 MG TABLET: 4 | 7 days supply | Qty: 40 | Fill #0

## 2017-02-11 MED FILL — HYDROCODON-APAP 7.5-325: 7.5-325 | 11 days supply | Qty: 90 | Fill #0

## 2017-02-11 SURGICAL SUPPLY — 65 items
APL SKNCLS STERI-STRIP NONHPOA (GAUZE/BANDAGES/DRESSINGS)
BENZOIN TINCTURE PRP APPL 2/3 (GAUZE/BANDAGES/DRESSINGS) IMPLANT
BLADE 4.2CUDA (BLADE) ×3 IMPLANT
BLADE CUTTER GATOR 3.5 (BLADE) IMPLANT
BLADE GREAT WHITE 4.2 (BLADE) IMPLANT
BLADE GREAT WHITE 4.2MM (BLADE)
BLADE SURG 15 STRL LF DISP TIS (BLADE) IMPLANT
BLADE SURG 15 STRL SS (BLADE)
BUR OVAL 4.0 (BURR) ×3 IMPLANT
CANNULA 5.75X71 LONG (CANNULA) ×3 IMPLANT
CANNULA TWIST IN 8.25X7CM (CANNULA) IMPLANT
CANNULA TWIST IN 8.25X9CM (CANNULA) IMPLANT
CLOSURE STERI-STRIP 1/2X4 (GAUZE/BANDAGES/DRESSINGS)
CLSR STERI-STRIP ANTIMIC 1/2X4 (GAUZE/BANDAGES/DRESSINGS) IMPLANT
DECANTER SPIKE VIAL GLASS SM (MISCELLANEOUS) IMPLANT
DRAPE IMP U-DRAPE 54X76 (DRAPES) ×3 IMPLANT
DRAPE INCISE IOBAN 66X45 STRL (DRAPES) ×3 IMPLANT
DRAPE STERI 35X30 U-POUCH (DRAPES) ×3 IMPLANT
DRAPE SURG 17X23 STRL (DRAPES) ×3 IMPLANT
DRAPE U-SHAPE 47X51 STRL (DRAPES) ×6 IMPLANT
DRAPE U-SHAPE 76X120 STRL (DRAPES) ×6 IMPLANT
DRSG PAD ABDOMINAL 8X10 ST (GAUZE/BANDAGES/DRESSINGS) ×6 IMPLANT
DURAPREP 26ML APPLICATOR (WOUND CARE) ×6 IMPLANT
ELECT REM PT RETURN 9FT ADLT (ELECTROSURGICAL)
ELECTRODE REM PT RTRN 9FT ADLT (ELECTROSURGICAL) IMPLANT
GAUZE SPONGE 4X4 12PLY STRL (GAUZE/BANDAGES/DRESSINGS) ×3 IMPLANT
GAUZE XEROFORM 1X8 LF (GAUZE/BANDAGES/DRESSINGS) ×3 IMPLANT
GLOVE BIO SURGEON STRL SZ 6.5 (GLOVE) ×2 IMPLANT
GLOVE BIO SURGEONS STRL SZ 6.5 (GLOVE) ×2
GLOVE SKINSENSE NS SZ7.5 (GLOVE) ×2
GLOVE SKINSENSE STRL SZ7.5 (GLOVE) ×1 IMPLANT
GLOVE SURG SYN 7.5  E (GLOVE) ×2
GLOVE SURG SYN 7.5 E (GLOVE) ×1 IMPLANT
GLOVE SURG SYN 7.5 PF PI (GLOVE) ×1 IMPLANT
GOWN STRL REIN XL XLG (GOWN DISPOSABLE) ×3 IMPLANT
GOWN STRL REUS W/ TWL LRG LVL3 (GOWN DISPOSABLE) ×1 IMPLANT
GOWN STRL REUS W/TWL LRG LVL3 (GOWN DISPOSABLE) ×3
IMMOBILIZER SHOULDER FOAM XLGE (SOFTGOODS) IMPLANT
KIT SHOULDER TRACTION (DRAPES) ×3 IMPLANT
MANIFOLD NEPTUNE II (INSTRUMENTS) ×3 IMPLANT
NDL SCORPION MULTI FIRE (NEEDLE) IMPLANT
NEEDLE SCORPION MULTI FIRE (NEEDLE) IMPLANT
PACK ARTHROSCOPY DSU (CUSTOM PROCEDURE TRAY) ×3 IMPLANT
PACK BASIN DAY SURGERY FS (CUSTOM PROCEDURE TRAY) ×3 IMPLANT
PROBE BIPOLAR ATHRO 135MM 90D (MISCELLANEOUS) ×3 IMPLANT
SET ARTHROSCOPY TUBING (MISCELLANEOUS) ×3
SET ARTHROSCOPY TUBING LN (MISCELLANEOUS) ×1 IMPLANT
SHEET MEDIUM DRAPE 40X70 STRL (DRAPES) ×3 IMPLANT
SLEEVE SCD COMPRESS KNEE MED (MISCELLANEOUS) ×3 IMPLANT
SLING ARM FOAM STRAP LRG (SOFTGOODS) IMPLANT
SLING ARM IMMOBILIZER LRG (SOFTGOODS) IMPLANT
SLING ARM IMMOBILIZER MED (SOFTGOODS) IMPLANT
SLING ARM MED ADULT FOAM STRAP (SOFTGOODS) IMPLANT
SLING ARM XL FOAM STRAP (SOFTGOODS) ×2 IMPLANT
SUT ETHILON 3 0 PS 1 (SUTURE) ×3 IMPLANT
SUT FIBERWIRE #2 38 T-5 BLUE (SUTURE)
SUT TIGER TAPE 7 IN WHITE (SUTURE) IMPLANT
SUTURE FIBERWR #2 38 T-5 BLUE (SUTURE) IMPLANT
SYR 50ML LL SCALE MARK (SYRINGE) IMPLANT
TAPE FIBER 2MM 7IN #2 BLUE (SUTURE) IMPLANT
TOWEL OR 17X24 6PK STRL BLUE (TOWEL DISPOSABLE) ×3 IMPLANT
TOWEL OR NON WOVEN STRL DISP B (DISPOSABLE) ×3 IMPLANT
TUBE CONNECTING 20'X1/4 (TUBING)
TUBE CONNECTING 20X1/4 (TUBING) IMPLANT
WATER STERILE IRR 1000ML POUR (IV SOLUTION) ×3 IMPLANT

## 2017-02-11 NOTE — Anesthesia Postprocedure Evaluation (Signed)
Anesthesia Post Note  Patient: Shane Clark  Procedure(s) Performed: Procedure(s) (LRB): LEFT SHOULDER ARTHROSCOPY WITH DEBRIDEMENT, SUBACROMIAL DECOMPRESSION AND DISTAL CLAVICLE EXCISION (Left)  Patient location during evaluation: PACU Anesthesia Type: General Level of consciousness: awake and alert Pain management: pain level controlled Vital Signs Assessment: post-procedure vital signs reviewed and stable Respiratory status: spontaneous breathing, nonlabored ventilation, respiratory function stable and patient connected to nasal cannula oxygen Cardiovascular status: blood pressure returned to baseline and stable Postop Assessment: no signs of nausea or vomiting Anesthetic complications: no Comments: No pain       Last Vitals:  Vitals:   02/11/17 1112 02/11/17 1211  BP:  107/64  Pulse: 92 95  Resp: 17 18  Temp:  36.4 C    Last Pain:  Vitals:   02/11/17 1211  TempSrc: Oral  PainSc: 0-No pain                 [name] EDWARD

## 2017-02-11 NOTE — Discharge Instructions (Signed)
Shoulder Exercises Ask your health care provider which exercises are safe for you. Do exercises exactly as told by your health care provider and adjust them as directed. It is normal to feel mild stretching, pulling, tightness, or discomfort as you do these exercises, but you should stop right away if you feel sudden pain or your pain gets worse.Do not begin these exercises until told by your health care provider. RANGE OF MOTION EXERCISES  These exercises warm up your muscles and joints and improve the movement and flexibility of your shoulder. These exercises also help to relieve pain, numbness, and tingling. These exercises involve stretching your injured shoulder directly. Exercise A: Pendulum   1. Stand near a wall or a surface that you can hold onto for balance. 2. Bend at the waist and let your left / right arm hang straight down. Use your other arm to support you. Keep your back straight and do not lock your knees. 3. Relax your left / right arm and shoulder muscles, and move your hips and your trunk so your left / right arm swings freely. Your arm should swing because of the motion of your body, not because you are using your arm or shoulder muscles. 4. Keep moving your body so your arm swings in the following directions, as told by your health care provider:  Side to side.  Forward and backward.  In clockwise and counterclockwise circles. 5. Continue each motion for __________ seconds, or for as long as told by your health care provider. 6. Slowly return to the starting position. Repeat __________ times. Complete this exercise __________ times a day. Exercise B:Flexion, Standing   1. Stand and hold a broomstick, a cane, or a similar object. Place your hands a little more than shoulder-width apart on the object. Your left / right hand should be palm-up, and your other hand should be palm-down. 2. Keep your elbow straight and keep your shoulder muscles relaxed. Push the stick down  with your healthy arm to raise your left / right arm in front of your body, and then over your head until you feel a stretch in your shoulder.  Avoid shrugging your shoulder while you raise your arm. Keep your shoulder blade tucked down toward the middle of your back. 3. Hold for __________ seconds. 4. Slowly return to the starting position. Repeat __________ times. Complete this exercise __________ times a day. Exercise C: Abduction, Standing  1. Stand and hold a broomstick, a cane, or a similar object. Place your hands a little more than shoulder-width apart on the object. Your left / right hand should be palm-up, and your other hand should be palm-down. 2. While keeping your elbow straight and your shoulder muscles relaxed, push the stick across your body toward your left / right side. Raise your left / right arm to the side of your body and then over your head until you feel a stretch in your shoulder.  Do not raise your arm above shoulder height, unless your health care provider tells you to do that.  Avoid shrugging your shoulder while you raise your arm. Keep your shoulder blade tucked down toward the middle of your back. 3. Hold for __________ seconds. 4. Slowly return to the starting position. Repeat __________ times. Complete this exercise __________ times a day. Exercise D:Internal Rotation   1. Place your left / right hand behind your back, palm-up. 2. Use your other hand to dangle an exercise band, a towel, or a similar object over  your shoulder. Grasp the band with your left / right hand so you are holding onto both ends. 3. Gently pull up on the band until you feel a stretch in the front of your left / right shoulder.  Avoid shrugging your shoulder while you raise your arm. Keep your shoulder blade tucked down toward the middle of your back. 4. Hold for __________ seconds. 5. Release the stretch by letting go of the band and lowering your hands. Repeat __________ times.  Complete this exercise __________ times a day. STRETCHING EXERCISES  These exercises warm up your muscles and joints and improve the movement and flexibility of your shoulder. These exercises also help to relieve pain, numbness, and tingling. These exercises are done using your healthy shoulder to help stretch the muscles of your injured shoulder. Exercise E: Warehouse manager (External Rotation and Abduction)   1. Stand in a doorway with one of your feet slightly in front of the other. This is called a staggered stance. If you cannot reach your forearms to the door frame, stand facing a corner of a room. 2. Choose one of the following positions as told by your health care provider:  Place your hands and forearms on the door frame above your head.  Place your hands and forearms on the door frame at the height of your head.  Place your hands on the door frame at the height of your elbows. 3. Slowly move your weight onto your front foot until you feel a stretch across your chest and in the front of your shoulders. Keep your head and chest upright and keep your abdominal muscles tight. 4. Hold for __________ seconds. 5. To release the stretch, shift your weight to your back foot. Repeat __________ times. Complete this stretch __________ times a day. Exercise F:Extension, Standing  1. Stand and hold a broomstick, a cane, or a similar object behind your back.  Your hands should be a little wider than shoulder-width apart.  Your palms should face away from your back. 2. Keeping your elbows straight and keeping your shoulder muscles relaxed, move the stick away from your body until you feel a stretch in your shoulder.  Avoid shrugging your shoulders while you move the stick. Keep your shoulder blade tucked down toward the middle of your back. 3. Hold for __________ seconds. 4. Slowly return to the starting position. Repeat __________ times. Complete this exercise __________ times a  day. STRENGTHENING EXERCISES  These exercises build strength and endurance in your shoulder. Endurance is the ability to use your muscles for a long time, even after they get tired. Exercise G:External Rotation   1. Sit in a stable chair without armrests. 2. Secure an exercise band at elbow height on your left / right side. 3. Place a soft object, such as a folded towel or a small pillow, between your left / right upper arm and your body to move your elbow a few inches away (about 10 cm) from your side. 4. Hold the end of the band so it is tight and there is no slack. 5. Keeping your elbow pressed against the soft object, move your left / right forearm out, away from your abdomen. Keep your body steady so only your forearm moves. 6. Hold for __________ seconds. 7. Slowly return to the starting position. Repeat __________ times. Complete this exercise __________ times a day. Exercise H:Shoulder Abduction   1. Sit in a stable chair without armrests, or stand. 2. Hold a __________ weight in your  left / right hand, or hold an exercise band with both hands. 3. Start with your arms straight down and your left / right palm facing in, toward your body. 4. Slowly lift your left / right hand out to your side. Do not lift your hand above shoulder height unless your health care provider tells you that this is safe.  Keep your arms straight.  Avoid shrugging your shoulder while you do this movement. Keep your shoulder blade tucked down toward the middle of your back. 5. Hold for __________ seconds. 6. Slowly lower your arm, and return to the starting position. Repeat __________ times. Complete this exercise __________ times a day. Exercise I:Shoulder Extension  1. Sit in a stable chair without armrests, or stand. 2. Secure an exercise band to a stable object in front of you where it is at shoulder height. 3. Hold one end of the exercise band in each hand. Your palms should face each  other. 4. Straighten your elbows and lift your hands up to shoulder height. 5. Step back, away from the secured end of the exercise band, until the band is tight and there is no slack. 6. Squeeze your shoulder blades together as you pull your hands down to the sides of your thighs. Stop when your hands are straight down by your sides. Do not let your hands go behind your body. 7. Hold for __________ seconds. 8. Slowly return to the starting position. Repeat __________ times. Complete this exercise __________ times a day. Exercise J:Standing Shoulder Row  1. Sit in a stable chair without armrests, or stand. 2. Secure an exercise band to a stable object in front of you so it is at waist height. 3. Hold one end of the exercise band in each hand. Your palms should be in a thumbs-up position. 4. Bend each of your elbows to an "L" shape (about 90 degrees) and keep your upper arms at your sides. 5. Step back until the band is tight and there is no slack. 6. Slowly pull your elbows back behind you. 7. Hold for __________ seconds. 8. Slowly return to the starting position. Repeat __________ times. Complete this exercise __________ times a day. Exercise K:Shoulder Press-Ups   1. Sit in a stable chair that has armrests. Sit upright, with your feet flat on the floor. 2. Put your hands on the armrests so your elbows are bent and your fingers are pointing forward. Your hands should be about even with the sides of your body. 3. Push down on the armrests and use your arms to lift yourself off of the chair. Straighten your elbows and lift yourself up as much as you comfortably can.  Move your shoulder blades down, and avoid letting your shoulders move up toward your ears.  Keep your feet on the ground. As you get stronger, your feet should support less of your body weight as you lift yourself up. 4. Hold for __________ seconds. 5. Slowly lower yourself back into the chair. Repeat __________ times.  Complete this exercise __________ times a day. Exercise L: Wall Push-Ups   1. Stand so you are facing a stable wall. Your feet should be about one arm-length away from the wall. 2. Lean forward and place your palms on the wall at shoulder height. 3. Keep your feet flat on the floor as you bend your elbows and lean forward toward the wall. 4. Hold for __________ seconds. 5. Straighten your elbows to push yourself back to the starting position. Repeat __________  times. Complete this exercise __________ times a day. This information is not intended to replace advice given to you by your health care provider. Make sure you discuss any questions you have with your health care provider. Document Released: 10/01/2005 Document Revised: 08/11/2016 Document Reviewed: 07/29/2015 Elsevier Interactive Patient Education  2017 [place].   Secondary Shoulder Impingement Syndrome Rehab Ask your health care provider which exercises are safe for you. Do exercises exactly as told by your health care provider and adjust them as directed. It is normal to feel mild stretching, pulling, tightness, or discomfort as you do these exercises, but you should stop right away if you feel sudden pain or your pain gets worse. Do not begin these exercises until told by your health care provider. Stretching and range of motion exercise This exercise warms up your muscles and joints and improves the movement and flexibility of your neck and shoulder. This exercise also helps to relieve pain and stiffness. Exercise A: Cervical side bend   1. Using good posture, sit on a stable chair, or stand up. 2. Without moving your shoulders, slowly tilt your left / right ear toward your left / right shoulder until you feel a stretch in your neck muscles. You should be looking straight ahead. 3. Hold for __________ seconds. 4. Slowly return to the starting position. 5. Repeat on your left / right side. Repeat __________ times. Complete  this exercise __________ times a day. Strengthening exercises These exercises build strength and endurance in your shoulder. Endurance is the ability to use your muscles for a long time, even after they get tired. Exercise B: Scapular protraction, supine   1. Lie on your back on a firm surface. Hold a __________ weight in your left / right hand. 2. Raise your left / right arm straight into the air so your hand is directly above your shoulder joint. 3. Push the weight into the air so your shoulder lifts off of the surface that you are lying on. Do not move your head, neck, or back. 4. Hold for __________ seconds. 5. Slowly return to the starting position. Let your muscles relax completely before you repeat this exercise. Repeat __________ times. Complete this exercise __________ times a day. Exercise C: Scapular retraction   1. Sit in a stable chair without armrests, or stand. 2. Secure an exercise band to a stable object in front of you so the band is at shoulder height. 3. Hold one end of the exercise band in each hand. Your palms should face down. 4. Squeeze your shoulder blades together and move your elbows slightly behind you. Do not shrug your shoulders while you do this. 5. Hold for __________ seconds. 6. Slowly return to the starting position. Repeat __________ times. Complete this exercise __________ times a day. Exercise D: Shoulder extension with scapular retraction   1. Sit in a stable chair without armrests, or stand. 2. Secure an exercise band to a stable object in front of you where it is above shoulder height. 3. Hold one end of the exercise band in each hand. 4. Straighten your elbows and lift your hands up to shoulder height. 5. Squeeze your shoulder blades together and pull your hands down to the sides of your thighs. Stop when your hands are straight down by your sides. Do not let your hands go behind your body. 6. Hold for __________ seconds. 7. Slowly return to the  starting position. Repeat __________ times. Complete this exercise __________ times a day. Exercise E:  Shoulder abduction  6. Sit in a stable chair without armrests, or stand. 7. If directed, hold a __________ weight in your left / right hand. 8. Start with your arms straight down. Turn your left / right hand so your palm faces in, toward your body. 9. Slowly lift your left / right hand out to your side. Do not lift your hand above shoulder height.  Keep your arms straight.  Avoid shrugging your shoulder while you do this movement. Keep your shoulder blade tucked down toward the middle of your back. 10. Hold for __________ seconds. 11. Slowly lower your arm, and return to the starting position. Repeat __________ times. Complete this exercise __________ times a day. This information is not intended to replace advice given to you by your health care provider. Make sure you discuss any questions you have with your health care provider. Document Released: 11/17/2005 Document Revised: 07/24/2016 Document Reviewed: 10/20/2015 Elsevier Interactive Patient Education  2017 [employer]    Post-operative patient instructions  Shoulder Arthroscopy    Ice:  Place intermittent ice or cooler pack over your knee, 30 minutes on and 30 minutes off.  Continue this for the first 72 hours after surgery, then save ice for use after therapy sessions or on more active days.    Weight:  You may bear weight on your arm as your symptoms allow.  Motion:  Perform gentle shoulder motion as tolerated  Dressing:  Perform 1st dressing change at 2 days postoperative. A moderate amount of blood tinged drainage is to be expected.  So if you bleed through the dressing on the first or second day or if you have fevers, it is fine to change the dressing/check the wounds early and redress wound. Elevate your leg.  If it bleeds through again, or if the incisions are leaking frank blood, please call the office. May change  dressing every 1-2 days thereafter to help watch wounds. Can purchase Tegderm (or 24M Nexcare) water resistant dressings at local pharmacy / [employer].  Shower:  Light shower is ok after 2 days.  Please take shower, NO bath. Recover with gauze and ace wrap to help keep wounds protected.    Pain medication:  A narcotic pain medication has been prescribed.  Take as directed.  Typically you need narcotic pain medication more regularly during the first 3 to 5 days after surgery.  Decrease your use of the medication as the pain improves.  Narcotics can sometimes cause constipation, even after a few doses.  If you have problems with constipation, you can take an over the counter stool softener or light laxative.  If you have persistent problems, please notify your physicians office.  Physical therapy: Additional activity guidelines to be provided by your physician or physical therapist at follow-up visits.   Driving: Do not recommend driving x 2 weeks post surgical, especially if surgery performed on right side. Should not drive while taking narcotic pain medications. It typically takes at least 2 weeks to restore sufficient neuromuscular function for normal reaction times for driving safety.   Call [phone] for questions or problems. Evenings you will be forwarded to the hospital operator.  Ask for the orthopaedic physician on call. Please call if you experience:    o Redness, foul smelling, or persistent drainage from the surgical site  o worsening knee pain and swelling not responsive to medication  o any calf pain and or swelling of the lower leg  o temperatures greater than 101.5 F o other questions  or concerns   Thank you for allowing [place] to be a part of your care.   Regional Anesthesia Blocks  1. Numbness or the inability to move the "blocked" extremity may last from 3-48 hours after placement. The length of time depends on the medication injected and your individual response to the  medication. If the numbness is not going away after 48 hours, call your surgeon.  2. The extremity that is blocked will need to be protected until the numbness is gone and the  Strength has returned. Because you cannot feel it, you will need to take extra care to avoid injury. Because it may be weak, you may have difficulty moving it or using it. You may not know what position it is in without looking at it while the block is in effect.  3. For blocks in the legs and feet, returning to weight bearing and walking needs to be done carefully. You will need to wait until the numbness is entirely gone and the strength has returned. You should be able to move your leg and foot normally before you try and bear weight or walk. You will need someone to be with you when you first try to ensure you do not fall and possibly risk injury.  4. Bruising and tenderness at the needle site are common side effects and will resolve in a few days.  5. Persistent numbness or new problems with movement should be communicated to the surgeon or the [facility] [phone] [place] [phone]).

## 2017-02-11 NOTE — Op Note (Signed)
   Date of Surgery: 04/09/2016  INDICATIONS: The patient is a 66 y.o.-year-old male with left shoulder pain that has failed conservative treatment;  The patient did consent to the procedure after discussion of the risks and benefits.  PREOPERATIVE DIAGNOSIS:  1. Left shoulder impingement syndrome 2. Left shoulder degenerative SLAP tear  POSTOPERATIVE DIAGNOSIS: Same.  PROCEDURE:  1. Left shoulder arthroscopy with extensive debridement of anterior labrum, superior labrum, biceps tenotomy, subacromial bursa 2. Left shoulder arthroscopy with acromioplasty  SURGEON: N. Eduard Roux, M.D.  ASSIST: None.  ANESTHESIA:  general, regional  IV FLUIDS AND URINE: See anesthesia.  ESTIMATED BLOOD LOSS: minimal mL.  IMPLANTS: None  COMPLICATIONS: None.  DESCRIPTION OF PROCEDURE: The patient was brought to the operating room and placed supine on the operating table.  The patient had been signed prior to the procedure and this was documented. The patient had the anesthesia placed by the anesthesiologist.  A time-out was performed to confirm that this was the correct patient, site, side and location. The patient did receive antibiotics prior to the incision and was re-dosed during the procedure as needed at indicated intervals.  The patient was then moved into the lateral position with the operative extremity suspended in the fishing pole mechanism. The patient had the operative extremity prepped and draped in the standard surgical fashion.    A standard posterior and anterior arthroscopy portals were created. We first performed a diagnostic shoulder arthroscopy of the shoulder joint. The posterior labrum had mild degenerative changes which was debrided using oscillating shaver. We then worked around to the superior labral complex which showed a type II SLAP tear. I then performed a biceps tenotomy with a cautery wand. The stump of the biceps was then debrided using an oscillation back to the stable  border. The anterior labrum was then gently debrided also with oscillating shaver. We visualized the articular surface of the rotator cuff which was healthy and intact. There is no evidence of tears. We then repositioned the instruments into the subacromial space. There was extensive subacromial bursitis within the space. The subacromial space was also very tight. Subacromial decompression with acromioplasty was performed with an oscillating shaver and high-speed bur and cautery wand. The distal clavicle in the acromioclavicular joint was identified but I found that this was essentially autofused. I then inspected the bursal surface of the rotator cuff and gentle debridement was again performed back to bleeding border. There were no frank tears. The arthroscopy portals were closed with interrupted 3-0 nylon sutures. Sterile dressings were applied. Shoulder was placed in a regular shoulder sling and patient tolerated procedure well and had no immediate complications  POSTOPERATIVE PLAN: Patient will be weight-bear as tolerated and activity as tolerated to the left upper extremity.  Azucena Cecil, MD [place] 10:16 AM

## 2017-02-11 NOTE — Anesthesia Procedure Notes (Addendum)
Anesthesia Regional Block: Interscalene brachial plexus block   Pre-Anesthetic Checklist: ,, timeout performed, Correct Patient, Correct Site, Correct Laterality, Correct Procedure, Correct Position, site marked, Risks and benefits discussed, pre-op evaluation,  At surgeon's request and post-op pain management  Laterality: Left  Prep: chloraprep       Needles:   Needle Type: Echogenic Needle     Needle Length: 9cm  Needle Gauge: 21     Additional Needles:   Procedures: ultrasound guided,,,,,,,,  Narrative:  Start time: 02/11/2017 7:09 AM End time: 02/11/2017 7:16 AM Injection made incrementally with aspirations every 5 mL. Anesthesiologist: Lyndle Herrlich  Additional Notes: 30cc .5% [place]       Left Interscalene Block

## 2017-02-11 NOTE — H&P (Signed)
PREOPERATIVE H&P  Chief Complaint: left shoulder impingement  HPI: Shane Clark is a 66 y.o. male who presents for surgical treatment of left shoulder impingement.  He denies any changes in medical history.  Past Medical History:  Diagnosis Date  . Allergy   . Arthritis   . Asthma   . BPH (benign prostatic hyperplasia)   . Chronic back pain    "all over"  . COPD (chronic obstructive pulmonary disease) ([place])   . GERD (gastroesophageal reflux disease)   . Hx of adenomatous colonic polyps 03/22/2016  . Hypertension   . Influenza 11/20/2011  . Sepsis ([place]) 02/05/2016   Past Surgical History:  Procedure Laterality Date  . ABDOMINAL HERNIA REPAIR    . ANTERIOR CERVICAL DECOMP/DISCECTOMY FUSION     C6-8  . APPENDECTOMY  1960s  . BACK SURGERY    . COLONOSCOPY    . HERNIA REPAIR    . INSERTION DISTRACTION DEVICE LUMBAR POSTERIOR SPINOUS PROCESS    . KNEE ARTHROSCOPY Left   . SMALL BOWEL REPAIR     Social History   Social History  . Marital status: Married    Spouse name: N/A  . Number of children: N/A  . Years of education: N/A   Social History Main Topics  . Smoking status: Former Smoker    Packs/day: 0.12    Years: 20.00    Types: Cigarettes  . Smokeless tobacco: Former Systems developer     Comment: "quit smoking cigarettes in the 1980s"  . Alcohol use No  . Drug use: No  . Sexual activity: Yes   Other Topics Concern  . None   Social History Narrative  . None   Family History  Problem Relation Age of Onset  . Coronary artery disease Father   . Pancreatic cancer Brother   . Stroke Brother   . Heart disease Brother   . Diabetes type II Other   . Colon cancer Neg Hx   . Colon polyps Neg Hx   . Rectal cancer Neg Hx   . Stomach cancer Neg Hx    Allergies  Allergen Reactions  . Oxycodone-Acetaminophen Anaphylaxis  . Advair Diskus [Fluticasone-Salmeterol]     unknown  . Pentazocine Lactate Nausea Only and Other (See Comments)    Stopped breathing "talwin"    Prior to Admission medications   Medication Sig Start Date End Date Taking? Authorizing Provider  aspirin EC 81 MG tablet Take 81 mg by mouth daily.     Yes Historical Provider, MD  budesonide-formoterol (SYMBICORT) 80-4.5 MCG/ACT inhaler Inhale 2 puffs into the lungs 2 (two) times daily.   Yes Historical Provider, MD  clobetasol cream (TEMOVATE) 3.71 % Apply 1 application topically every 14 (fourteen) days. 2 weeks of use then stop for 2 weeks, the repeat 12/18/15  Yes Historical Provider, MD  loratadine (CLARITIN) 10 MG tablet Take 10 mg by mouth daily.   Yes Historical Provider, MD  nitroGLYCERIN (NITROSTAT) 0.4 MG SL tablet Place 0.4 mg under the tongue every 5 (five) minutes as needed for chest pain.   Yes Historical Provider, MD  pantoprazole (PROTONIX) 40 MG tablet Take 40 mg by mouth daily.     Yes Historical Provider, MD  Tamsulosin HCl (FLOMAX) 0.4 MG CAPS Take 0.4 mg by mouth daily.     Yes Historical Provider, MD  telmisartan (MICARDIS) 40 MG tablet Take 40 mg by mouth daily. 11/28/15  Yes Historical Provider, MD  albuterol (PROVENTIL HFA;VENTOLIN HFA) 108 (90 BASE) MCG/ACT inhaler  Inhale 2 puffs into the lungs every 6 (six) hours as needed. Wheezing/shortness of breath     Historical Provider, MD  HYDROcodone-acetaminophen (NORCO) 10-325 MG tablet Take 1 tablet by mouth every 6 (six) hours as needed.    Historical Provider, MD  promethazine (PHENERGAN) 25 MG tablet  01/17/16   Historical Provider, MD     Positive ROS: All other systems have been reviewed and were otherwise negative with the exception of those mentioned in the HPI and as above.  Physical Exam: General: Alert, no acute distress Cardiovascular: No pedal edema Respiratory: No cyanosis, no use of accessory musculature GI: abdomen soft Skin: No lesions in the area of chief complaint Neurologic: Sensation intact distally Psychiatric: Patient is competent for consent with normal mood and affect Lymphatic: no  lymphedema  MUSCULOSKELETAL: exam stable  Assessment: left shoulder impingement  Plan: Plan for Procedure(s): LEFT SHOULDER ARTHROSCOPY WITH DEBRIDEMENT, SUBACROMIAL DECOMPRESSION AND DISTAL CLAVICLE EXCISION  The risks benefits and alternatives were discussed with the patient including but not limited to the risks of nonoperative treatment, versus surgical intervention including infection, bleeding, nerve injury,  blood clots, cardiopulmonary complications, morbidity, mortality, among others, and they were willing to proceed.   Eduard Roux, MD   02/11/2017 8:19 AM

## 2017-02-11 NOTE — Addendum Note (Signed)
Addendum  created 02/11/17 1406 by Lyndle Herrlich, MD   Anesthesia Intra Blocks edited, Anesthesia Intra Meds edited, Sign clinical note

## 2017-02-11 NOTE — Anesthesia Procedure Notes (Signed)
Procedure Name: Intubation Date/Time: 02/11/2017 8:37 AM Performed by: Marrianne Mood Pre-anesthesia Checklist: Patient identified, Emergency Drugs available, Suction available, Patient being monitored and Timeout performed Patient Re-evaluated:Patient Re-evaluated prior to inductionOxygen Delivery Method: Circle system utilized Preoxygenation: Pre-oxygenation with 100% oxygen Intubation Type: IV induction Ventilation: Mask ventilation without difficulty Laryngoscope Size: Miller and 3 Tube type: Oral Tube size: 8.0 mm Number of attempts: 1 Airway Equipment and Method: Stylet and Oral airway Placement Confirmation: ETT inserted through vocal cords under direct vision,  positive ETCO2 and breath sounds checked- equal and bilateral Secured at: 23 cm Tube secured with: Tape Dental Injury: Teeth and Oropharynx as per pre-operative assessment

## 2017-02-11 NOTE — Addendum Note (Signed)
Addendum  created 02/11/17 1409 by Lyndle Herrlich, MD   Anesthesia Intra Meds edited

## 2017-02-11 NOTE — Progress Notes (Signed)
AssistedDr. Kyle Jackson with left, ultrasound guided, interscalene  block. Side rails up, monitors on throughout procedure. See vital signs in flow sheet. Tolerated Procedure well.  

## 2017-02-11 NOTE — Transfer of Care (Signed)
Immediate Anesthesia Transfer of Care Note  Patient: Shane Clark  Procedure(s) Performed: Procedure(s): LEFT SHOULDER ARTHROSCOPY WITH DEBRIDEMENT, SUBACROMIAL DECOMPRESSION AND DISTAL CLAVICLE EXCISION (Left)  Patient Location: PACU  Anesthesia Type:GA combined with regional for post-op pain  Level of Consciousness: awake and patient cooperative  Airway & Oxygen Therapy: Patient Spontanous Breathing and Patient connected to face mask oxygen  Post-op Assessment: Report given to RN and Post -op Vital signs reviewed and stable  Post vital signs: Reviewed and stable  Last Vitals:  Vitals:   02/11/17 0819 02/11/17 0820  BP:    Pulse: 78 76  Resp: 15 17  Temp:      Last Pain:  Vitals:   02/11/17 0736  TempSrc: Oral         Complications: No apparent anesthesia complications

## 2017-02-13 ENCOUNTER — Other Ambulatory Visit (INDEPENDENT_AMBULATORY_CARE_PROVIDER_SITE_OTHER): Payer: Self-pay | Admitting: Family

## 2017-02-13 ENCOUNTER — Telehealth (INDEPENDENT_AMBULATORY_CARE_PROVIDER_SITE_OTHER): Payer: Self-pay | Admitting: Radiology

## 2017-02-13 MED ORDER — PROMETHAZINE HCL 25 MG RE SUPP: 25.0000 mg | Freq: Four times a day (QID) | RECTAL | 0 refills | Status: DC | PRN

## 2017-02-13 MED FILL — PHENADOZ 25 MG SUPP: 25 | 4 days supply | Qty: 12 | Fill #0

## 2017-02-13 NOTE — Telephone Encounter (Signed)
Patient had shoulder surgery with Erlinda Hong yesterday and now he can not keep anything down,  He would like a suppository nausea med called into [facility] outpatient pharmacy.

## 2017-02-13 NOTE — Progress Notes (Unsigned)
prome

## 2017-02-13 NOTE — Telephone Encounter (Signed)
Ok have sent this

## 2017-02-15 ENCOUNTER — Emergency Department (HOSPITAL_COMMUNITY): Payer: Medicare Other

## 2017-02-15 ENCOUNTER — Encounter (HOSPITAL_COMMUNITY): Payer: Self-pay

## 2017-02-15 ENCOUNTER — Inpatient Hospital Stay (HOSPITAL_COMMUNITY)
Admission: EM | Admit: 2017-02-15 | Discharge: 2017-02-18 | DRG: 389 | Disposition: A | Discharge status: Home / Self Care | Payer: Medicare Other | Attending: Nephrology | Admitting: Nephrology

## 2017-02-15 DIAGNOSIS — Z8249 Family history of ischemic heart disease and other diseases of the circulatory system: Secondary | ICD-10-CM | POA: Diagnosis not present

## 2017-02-15 DIAGNOSIS — Z79899 Other long term (current) drug therapy: Secondary | ICD-10-CM | POA: Diagnosis not present

## 2017-02-15 DIAGNOSIS — G8929 Other chronic pain: Secondary | ICD-10-CM | POA: Diagnosis present

## 2017-02-15 DIAGNOSIS — M1990 Unspecified osteoarthritis, unspecified site: Secondary | ICD-10-CM | POA: Diagnosis present

## 2017-02-15 DIAGNOSIS — Z833 Family history of diabetes mellitus: Secondary | ICD-10-CM | POA: Diagnosis not present

## 2017-02-15 DIAGNOSIS — Z87891 Personal history of nicotine dependence: Secondary | ICD-10-CM

## 2017-02-15 DIAGNOSIS — D72829 Elevated white blood cell count, unspecified: Secondary | ICD-10-CM | POA: Diagnosis present

## 2017-02-15 DIAGNOSIS — Z9049 Acquired absence of other specified parts of digestive tract: Secondary | ICD-10-CM

## 2017-02-15 DIAGNOSIS — Z6839 Body mass index (BMI) 39.0-39.9, adult: Secondary | ICD-10-CM | POA: Diagnosis not present

## 2017-02-15 DIAGNOSIS — Z86010 Personal history of colonic polyps: Secondary | ICD-10-CM | POA: Diagnosis not present

## 2017-02-15 DIAGNOSIS — E669 Obesity, unspecified: Secondary | ICD-10-CM | POA: Diagnosis not present

## 2017-02-15 DIAGNOSIS — I959 Hypotension, unspecified: Secondary | ICD-10-CM | POA: Diagnosis present

## 2017-02-15 DIAGNOSIS — R17 Unspecified jaundice: Secondary | ICD-10-CM | POA: Diagnosis present

## 2017-02-15 DIAGNOSIS — J449 Chronic obstructive pulmonary disease, unspecified: Secondary | ICD-10-CM | POA: Diagnosis not present

## 2017-02-15 DIAGNOSIS — Z823 Family history of stroke: Secondary | ICD-10-CM | POA: Diagnosis not present

## 2017-02-15 DIAGNOSIS — K219 Gastro-esophageal reflux disease without esophagitis: Secondary | ICD-10-CM | POA: Diagnosis present

## 2017-02-15 DIAGNOSIS — Z7982 Long term (current) use of aspirin: Secondary | ICD-10-CM | POA: Diagnosis not present

## 2017-02-15 DIAGNOSIS — I10 Essential (primary) hypertension: Secondary | ICD-10-CM | POA: Diagnosis not present

## 2017-02-15 DIAGNOSIS — Z981 Arthrodesis status: Secondary | ICD-10-CM | POA: Diagnosis not present

## 2017-02-15 DIAGNOSIS — K56609 Unspecified intestinal obstruction, unspecified as to partial versus complete obstruction: Secondary | ICD-10-CM | POA: Diagnosis not present

## 2017-02-15 DIAGNOSIS — Z888 Allergy status to other drugs, medicaments and biological substances status: Secondary | ICD-10-CM

## 2017-02-15 DIAGNOSIS — Z800 Family history of malignant neoplasm of digestive organs: Secondary | ICD-10-CM

## 2017-02-15 DIAGNOSIS — M549 Dorsalgia, unspecified: Secondary | ICD-10-CM | POA: Diagnosis present

## 2017-02-15 LAB — CBC
HEMATOCRIT: 41.3 % (ref 39.0–52.0)
HEMOGLOBIN: 14.4 g/dL (ref 13.0–17.0)
MCH: 32.4 pg (ref 26.0–34.0)
MCHC: 34.9 g/dL (ref 30.0–36.0)
MCV: 92.8 fL (ref 78.0–100.0)
Platelets: 240 10*3/uL (ref 150–400)
RBC: 4.45 MIL/uL (ref 4.22–5.81)
RDW: 13.6 % (ref 11.5–15.5)
WBC: 15.9 10*3/uL — ABNORMAL HIGH (ref 4.0–10.5)

## 2017-02-15 LAB — LIPASE, BLOOD: LIPASE: 13 U/L (ref 11–51)

## 2017-02-15 LAB — COMPREHENSIVE METABOLIC PANEL
ALK PHOS: 61 U/L (ref 38–126)
ALT: 37 U/L (ref 17–63)
ANION GAP: 10 (ref 5–15)
AST: 27 U/L (ref 15–41)
Albumin: 3.9 g/dL (ref 3.5–5.0)
BUN: 31 mg/dL — ABNORMAL HIGH (ref 6–20)
CALCIUM: 8.9 mg/dL (ref 8.9–10.3)
CHLORIDE: 101 mmol/L (ref 101–111)
CO2: 24 mmol/L (ref 22–32)
Creatinine, Ser: 1.08 mg/dL (ref 0.61–1.24)
GFR calc non Af Amer: >60 mL/min (ref >60)
Glucose, Bld: 110 mg/dL — ABNORMAL HIGH (ref 65–99)
POTASSIUM: 4 mmol/L (ref 3.5–5.1)
Sodium: 135 mmol/L (ref 135–145)
Total Bilirubin: 1.4 mg/dL — ABNORMAL HIGH (ref 0.3–1.2)
Total Protein: 7.1 g/dL (ref 6.5–8.1)

## 2017-02-15 LAB — URINALYSIS, ROUTINE W REFLEX MICROSCOPIC
Bilirubin Urine: NEGATIVE
Glucose, UA: NEGATIVE mg/dL
HGB URINE DIPSTICK: NEGATIVE
Ketones, ur: NEGATIVE mg/dL
Leukocytes, UA: NEGATIVE
Nitrite: NEGATIVE
Protein, ur: NEGATIVE mg/dL
SPECIFIC GRAVITY, URINE: 1.038 — AB (ref 1.005–1.030)
pH: 5 (ref 5.0–8.0)

## 2017-02-15 LAB — I-STAT CG4 LACTIC ACID, ED: Lactic Acid, Venous: 0.91 mmol/L (ref 0.5–1.9)

## 2017-02-15 MED ORDER — IOPAMIDOL (ISOVUE-300) INJECTION 61%
INTRAVENOUS | Status: AC
  Administered 2017-02-15: 100 mL
  Filled 2017-02-15: qty 100

## 2017-02-15 MED ORDER — HYDROMORPHONE HCL 2 MG/ML IJ SOLN: 1.0000 mg | INTRAMUSCULAR | Status: DC | PRN

## 2017-02-15 MED ORDER — ONDANSETRON 4 MG PO TBDP
ORAL_TABLET | ORAL | Status: AC
  Filled 2017-02-15: qty 1

## 2017-02-15 MED ORDER — ONDANSETRON HCL 4 MG PO TABS: 4.0000 mg | ORAL_TABLET | Freq: Four times a day (QID) | ORAL | Status: DC | PRN

## 2017-02-15 MED ORDER — HYDROMORPHONE HCL 1 MG/ML IJ SOLN
1.0000 mg | Freq: Once | INTRAMUSCULAR | Status: AC
  Administered 2017-02-15: 1 mg via INTRAVENOUS
  Filled 2017-02-15: qty 1

## 2017-02-15 MED ORDER — PROMETHAZINE HCL 25 MG/ML IJ SOLN
12.5000 mg | Freq: Once | INTRAMUSCULAR | Status: AC
  Administered 2017-02-15: 12.5 mg via INTRAVENOUS
  Filled 2017-02-15: qty 1

## 2017-02-15 MED ORDER — SODIUM CHLORIDE 0.9 % IV SOLN
INTRAVENOUS | Status: DC
  Administered 2017-02-15: 06:00:00 via INTRAVENOUS
  Administered 2017-02-15: 1000 mL via INTRAVENOUS
  Administered 2017-02-15 – 2017-02-16 (×3): via INTRAVENOUS
  Administered 2017-02-16: 1000 mL via INTRAVENOUS
  Administered 2017-02-17 (×2): via INTRAVENOUS

## 2017-02-15 MED ORDER — ONDANSETRON 4 MG PO TBDP
4.0000 mg | ORAL_TABLET | Freq: Once | ORAL | Status: AC | PRN
  Administered 2017-02-15: 4 mg via ORAL

## 2017-02-15 MED ORDER — ENOXAPARIN SODIUM 40 MG/0.4ML ~~LOC~~ SOLN
40.0000 mg | SUBCUTANEOUS | Status: DC
  Administered 2017-02-15 – 2017-02-18 (×4): 40 mg via SUBCUTANEOUS
  Filled 2017-02-15 (×4): qty 0.4

## 2017-02-15 MED ORDER — ONDANSETRON HCL 4 MG/2ML IJ SOLN: 4.0000 mg | Freq: Four times a day (QID) | INTRAMUSCULAR | Status: DC | PRN

## 2017-02-15 MED ORDER — FLUTICASONE FUROATE-VILANTEROL 100-25 MCG/INH IN AEPB
1.0000 | INHALATION_SPRAY | Freq: Every day | RESPIRATORY_TRACT | Status: DC
  Administered 2017-02-15 – 2017-02-18 (×4): 1 via RESPIRATORY_TRACT
  Filled 2017-02-15: qty 28

## 2017-02-15 MED ORDER — SODIUM CHLORIDE 0.9 % IV BOLUS (SEPSIS)
1000.0000 mL | Freq: Once | INTRAVENOUS | Status: AC
  Administered 2017-02-15: 1000 mL via INTRAVENOUS

## 2017-02-15 NOTE — ED Notes (Signed)
Attempted to place NGT, met resistance in R nare, removed tube. Another attempt in L nare, met resistance again. Tube pulled out, Dr. Loleta Books notified of unsuccessful NGT insertion.

## 2017-02-15 NOTE — Consult Note (Signed)
Reason for Consult:Recurrent SBO Referring Physician: Dr. Darletta Moll is an 66 y.o. male.  HPI: This is a 66 yo male with multiple medical issues and two previous surgeries to SBO who presents after recent shoulder surgery by Dr. Erlinda Hong.  On 3/15, he began having some abdominal distention and discomfort.  He began having some nausea and vomiting.  His last BM was yesterday 3/17.  He also last vomited 3/17.  He states that his abdominal distention is better today and he is still passing flatus.  Minimal abdominal pain currently.  In 2003, he underwent exploratory laparotomy/ repair of umbilical hernia by Dr. Ninfa Linden.  In 2012, he underwent exploratory laparotomy with lysis of adhesions by Dr. Donne Hazel for small bowel obstruction.  Past Medical History:  Diagnosis Date  . Allergy   . Arthritis   . Asthma   . BPH (benign prostatic hyperplasia)   . Chronic back pain    "all over"  . COPD (chronic obstructive pulmonary disease) ([place])   . GERD (gastroesophageal reflux disease)   . Hx of adenomatous colonic polyps 03/22/2016  . Hypertension   . Influenza 11/20/2011  . Sepsis ([place]) 02/05/2016    Past Surgical History:  Procedure Laterality Date  . ABDOMINAL HERNIA REPAIR    . ANTERIOR CERVICAL DECOMP/DISCECTOMY FUSION     C6-8  . APPENDECTOMY  1960s  . BACK SURGERY    . COLONOSCOPY    . HERNIA REPAIR    . INSERTION DISTRACTION DEVICE LUMBAR POSTERIOR SPINOUS PROCESS    . KNEE ARTHROSCOPY Left   . SMALL BOWEL REPAIR      Family History  Problem Relation Age of Onset  . Coronary artery disease Father   . Pancreatic cancer Brother   . Stroke Brother   . Heart disease Brother   . Diabetes type II Other   . Colon cancer Neg Hx   . Colon polyps Neg Hx   . Rectal cancer Neg Hx   . Stomach cancer Neg Hx     Social History:  reports that he has quit smoking. His smoking use included Cigarettes. He has a 2.40 pack-year smoking history. He has quit using smokeless tobacco. He  reports that he does not drink alcohol or use drugs.  Allergies:  Allergies  Allergen Reactions  . Oxycodone-Acetaminophen Anaphylaxis  . Advair Diskus [Fluticasone-Salmeterol]     unknown  . Pentazocine Lactate Nausea Only and Other (See Comments)    Stopped breathing "talwin"    Medications:  Prior to Admission medications   Medication Sig Start Date End Date Taking? Authorizing Provider  albuterol (PROVENTIL HFA;VENTOLIN HFA) 108 (90 BASE) MCG/ACT inhaler Inhale 2 puffs into the lungs every 6 (six) hours as needed. Wheezing/shortness of breath    Yes Historical Provider, MD  aspirin EC 81 MG tablet Take 81 mg by mouth daily.     Yes Historical Provider, MD  budesonide-formoterol (SYMBICORT) 80-4.5 MCG/ACT inhaler Inhale 2 puffs into the lungs 2 (two) times daily.   Yes Historical Provider, MD  clobetasol cream (TEMOVATE) 9.83 % Apply 1 application topically every 14 (fourteen) days. 2 weeks of use then stop for 2 weeks, the repeat 12/18/15  Yes Historical Provider, MD  HYDROcodone-acetaminophen (NORCO) 7.5-325 MG tablet Take 1-2 tablets by mouth every 6 (six) hours as needed for moderate pain. 02/11/17  Yes Leandrew Koyanagi, MD  loratadine (CLARITIN) 10 MG tablet Take 10 mg by mouth daily.   Yes Historical Provider, MD  methocarbamol (ROBAXIN) 750 MG  tablet Take 1 tablet (750 mg total) by mouth 2 (two) times daily as needed for muscle spasms. 02/11/17  Yes Naiping Ephriam Jenkins, MD  nitroGLYCERIN (NITROSTAT) 0.4 MG SL tablet Place 0.4 mg under the tongue every 5 (five) minutes as needed for chest pain.   Yes Historical Provider, MD  ondansetron (ZOFRAN) 4 MG tablet Take 1-2 tablets (4-8 mg total) by mouth every 8 (eight) hours as needed for nausea or vomiting. 02/11/17  Yes Naiping Ephriam Jenkins, MD  pantoprazole (PROTONIX) 40 MG tablet Take 40 mg by mouth daily.     Yes Historical Provider, MD  promethazine (PHENERGAN) 25 MG suppository Place 1 suppository (25 mg total) rectally every 6 (six) hours as needed for  nausea or vomiting. 02/13/17  Yes Suzan Slick, NP  senna-docusate (SENOKOT S) 8.6-50 MG tablet Take 1 tablet by mouth at bedtime as needed. Patient taking differently: Take 1 tablet by mouth at bedtime as needed for mild constipation.  02/11/17  Yes Leandrew Koyanagi, MD  Tamsulosin HCl (FLOMAX) 0.4 MG CAPS Take 0.4 mg by mouth daily.     Yes Historical Provider, MD  telmisartan (MICARDIS) 40 MG tablet Take 40 mg by mouth daily. 11/28/15  Yes Historical Provider, MD  promethazine (PHENERGAN) 25 MG tablet Take 1 tablet (25 mg total) by mouth every 6 (six) hours as needed for nausea. Patient not taking: Reported on 02/15/2017 02/11/17   Leandrew Koyanagi, MD     Results for orders placed or performed during the hospital encounter of 02/15/17 (from the past 48 hour(s))  Lipase, blood     Status: None   Collection Time: 02/15/17 12:55 AM  Result Value Ref Range   Lipase 13 11 - 51 U/L  Comprehensive metabolic panel     Status: Abnormal   Collection Time: 02/15/17 12:55 AM  Result Value Ref Range   Sodium 135 135 - 145 mmol/L   Potassium 4.0 3.5 - 5.1 mmol/L   Chloride 101 101 - 111 mmol/L   CO2 24 22 - 32 mmol/L   Glucose, Bld 110 (H) 65 - 99 mg/dL   BUN 31 (H) 6 - 20 mg/dL   Creatinine, Ser 1.08 0.61 - 1.24 mg/dL   Calcium 8.9 8.9 - 10.3 mg/dL   Total Protein 7.1 6.5 - 8.1 g/dL   Albumin 3.9 3.5 - 5.0 g/dL   AST 27 15 - 41 U/L   ALT 37 17 - 63 U/L   Alkaline Phosphatase 61 38 - 126 U/L   Total Bilirubin 1.4 (H) 0.3 - 1.2 mg/dL   GFR calc non Af Amer >60 >60 mL/min   GFR calc Af Amer >60 >60 mL/min    Comment: (NOTE) The eGFR has been calculated using the CKD EPI equation. This calculation has not been validated in all clinical situations. eGFR's persistently <60 mL/min signify possible Chronic Kidney Disease.    Anion gap 10 5 - 15  CBC     Status: Abnormal   Collection Time: 02/15/17 12:55 AM  Result Value Ref Range   WBC 15.9 (H) 4.0 - 10.5 K/uL   RBC 4.45 4.22 - 5.81 MIL/uL    Hemoglobin 14.4 13.0 - 17.0 g/dL   HCT 41.3 39.0 - 52.0 %   MCV 92.8 78.0 - 100.0 fL   MCH 32.4 26.0 - 34.0 pg   MCHC 34.9 30.0 - 36.0 g/dL   RDW 13.6 11.5 - 15.5 %   Platelets 240 150 - 400 K/uL  I-Stat CG4 Lactic  Acid, ED     Status: None   Collection Time: 02/15/17  4:49 AM  Result Value Ref Range   Lactic Acid, Venous 0.91 0.5 - 1.9 mmol/L    Ct Abdomen Pelvis W Contrast  Result Date: 02/15/2017 CLINICAL DATA:  Abdominal pain with nausea and vomiting. History of bowel obstruction. EXAM: CT ABDOMEN AND PELVIS WITH CONTRAST TECHNIQUE: Multidetector CT imaging of the abdomen and pelvis was performed using the standard protocol following bolus administration of intravenous contrast. CONTRAST:  168m ISOVUE-300 IOPAMIDOL (ISOVUE-300) INJECTION 61% COMPARISON:  01/14/2011 FINDINGS: Lower chest: Atelectasis or consolidation in both lung bases. Can't exclude pneumonia. No pleural effusions. Hepatobiliary: No focal liver abnormality is seen. No gallstones, gallbladder wall thickening, or biliary dilatation. Pancreas: Unremarkable. No pancreatic ductal dilatation or surrounding inflammatory changes. Spleen: Normal in size without focal abnormality. Adrenals/Urinary Tract: Adrenal glands are unremarkable. Kidneys are normal, without renal calculi, focal lesion, or hydronephrosis. Bladder is unremarkable. Stomach/Bowel: Gas and fluid in the stomach without wall thickening. Dilated fluid and gas-filled proximal small bowel with air-fluid levels. Distal small bowel decompression. Transition zone is in the right lower anterior abdomen. This likely represents obstruction due to adhesions. There is a small ventral hernia containing partial wall of small bowel but this does not appear to be causing the obstruction. Colon is decompressed with scattered stool. No colonic wall thickening. Appendix is not identified. Vascular/Lymphatic: Aortic atherosclerosis. Focal aortic ectasia with maximal AP diameter of 2.8 cm. No  enlarged abdominal or pelvic lymph nodes. Reproductive: Prostate gland is enlarged, measuring 5.3 cm diameter. Other: No free air or free fluid in the abdomen. Musculoskeletal: Degenerative changes in the spine. No destructive bone lesions. IMPRESSION: Small bowel obstruction with transition zone in the right lower abdomen anteriorly. Causes likely due to adhesions. Atelectasis or consolidation in the lung bases. Prostate gland is enlarged. Electronically Signed   By: WLucienne CapersM.D.   On: 02/15/2017 03:45    Review of Systems  Constitutional: Negative for weight loss.  HENT: Negative for ear discharge, ear pain, hearing loss and tinnitus.   Eyes: Negative.  Negative for blurred vision, double vision, photophobia and pain.  Respiratory: Negative for cough, sputum production and shortness of breath.   Cardiovascular: Negative for chest pain.  Gastrointestinal: Positive for abdominal pain, nausea and vomiting.  Genitourinary: Negative for dysuria, flank pain, frequency and urgency.  Musculoskeletal: Negative for back pain, falls, joint pain, myalgias and neck pain.  Neurological: Negative.  Negative for dizziness, tingling, sensory change, focal weakness, loss of consciousness and headaches.  Endo/Heme/Allergies: Negative.  Does not bruise/bleed easily.  Psychiatric/Behavioral: Negative for depression, memory loss and substance abuse. The patient is not nervous/anxious.    Blood pressure 117/66, pulse 60, temperature 98.6 F (37 C), temperature source Oral, resp. rate 18, height _0  (1.753 m), weight 120.7 kg (266 lb 1.6 oz), SpO2 96 %. Physical Exam WDWN in NAD Eyes:  Pupils equal, round; sclera anicteric HENT:  Oral mucosa moist; good dentition  Neck:  No masses palpated, no thyromegaly Lungs:  CTA bilaterally; normal respiratory effort CV:  Regular rate and rhythm; no murmurs; extremities well-perfused with no edema Abd:  Obese, protuberant, but soft; small periumbilical ventral  incisional hernia; active bowel sounds; minimal tenderness Skin:  Warm, dry; no sign of jaundice Psychiatric - alert and oriented x 4; calm mood and affect  Assessment/Plan: Possible small bowel obstruction, given his history.  This seems to either be a PSBO or may be gastroenteritis.  He is  clinically not obstructed, so would not place NG at this time.  Bowel rest, IV hydration, ice chips.  Will recheck films in AM.  Encourage ambulation.  Imogene Burn. Georgette Dover, MD, [facility] Surgery  General/ Trauma Surgery  02/15/2017 9:24 AM   Ripley Bogosian K. 02/15/2017, 9:18 AM

## 2017-02-15 NOTE — Progress Notes (Signed)
PROGRESS NOTE  Shane Clark  ZOX:[id number] DOB: [birth year] DOA: 02/15/2017 PCP: Thora Lance, MD Outpatient Specialists:  Subjective: Has some nausea and abdominal pain, denies any other complaints this morning.  Brief Narrative:  Shane Clark is a 66 y.o. male with a past medical history significant for COPD, HTN, and SBO who presents with abdominal pain and vomiting for 3 days.  The patient was in his usual state of health until Thursday the day after his shoulder surgery when he developed vomiting. He and his wife initially thought this was just postoperative vomiting, Friday they got a Phenergan suppository from her orthopedist office, but by today the patient developed abdominal distention, intermittent colicky pain diffusely in the abdomen and persistent vomiting and dry heaves and couldn't keep any liquids or home medications down, so finally, they came to the emergency room.  Assessment & Plan:   Principal Problem:   Small bowel obstruction Active Problems:   COPD (chronic obstructive pulmonary disease) (HCC)   Essential hypertension   This is a no charge note, patient seen earlier today by my colleague Dr. Maryfrances Bunnell. Patient presented with nausea, vomiting and abdominal distention likely SBO. General surgery consulted.  1. Small bowel obstruction:  Recurrent.  Had SBO in 2012 requiring LOA.  2 days of symptoms now, unable to tolerate PO, worsening.   -Nothing by mouth -Place NG tube -Maintenance fluids -Zofran for nausea, hydromorphone for pain -Gen. surgery consult, appreciate cares -Trend CMP and CBC   2. Hypertension:  Slightly hypotensive at admission. -Hold Micardis until able to take PO  3. COPD:  Not active -Continue Symbicort  4. Other medications:  -Resume tamsulosin and PPI when able to take PO  5. Leukocytosis:  Suspect demargination  6. Elevated bilirubin:  Trace elevation. -Trend, if flat, stop     DVT prophylaxis:  Code  Status: Full Code Family Communication:  Disposition Plan:  Diet: Diet NPO time specified Except for: Sips with Meds, Ice Chips  Consultants:   Gen surg  Procedures:   None  Antimicrobials:   None   Objective: Vitals:   02/15/17 0415 02/15/17 0430 02/15/17 0515 02/15/17 0606  BP: 112/74 111/71 121/77 117/66  Pulse: 64 62 64 60  Resp:    18  Temp:    98.6 F (37 C)  TempSrc:    Oral  SpO2: 92% 92% 95% 96%  Weight:    120.7 kg (266 lb 1.6 oz)  Height:    5\' 9"  (1.753 m)    Intake/Output Summary (Last 24 hours) at 02/15/17 1115 Last data filed at 02/15/17 1109  Gross per 24 hour  Intake             1000 ml  Output              400 ml  Net              600 ml   Filed Weights   02/15/17 0606  Weight: 120.7 kg (266 lb 1.6 oz)    Examination: General exam: Appears calm and comfortable  Respiratory system: Clear to auscultation. Respiratory effort normal. Cardiovascular system: S1 & S2 heard, RRR. No JVD, murmurs, rubs, gallops or clicks. No pedal edema. Gastrointestinal system: Abdomen is nondistended, soft and nontender. No organomegaly or masses felt. Normal bowel sounds heard. Central nervous system: Alert and oriented. No focal neurological deficits. Extremities: Symmetric 5 x 5 power. Skin: No rashes, lesions or ulcers Psychiatry: Judgement and insight appear normal. Mood & affect appropriate.  Data Reviewed: I have personally reviewed following labs and imaging studies  CBC:  Recent Labs Lab 02/15/17 0055  WBC 15.9*  HGB 14.4  HCT 41.3  MCV 92.8  PLT 240   Basic Metabolic Panel:  Recent Labs Lab 02/15/17 0055  NA 135  K 4.0  CL 101  CO2 24  GLUCOSE 110*  BUN 31*  CREATININE 1.08  CALCIUM 8.9   GFR: Estimated Creatinine Clearance: 87.5 mL/min (by C-G formula based on SCr of 1.08 mg/dL). Liver Function Tests:  Recent Labs Lab 02/15/17 0055  AST 27  ALT 37  ALKPHOS 61  BILITOT 1.4*  PROT 7.1  ALBUMIN 3.9    Recent Labs Lab  02/15/17 0055  LIPASE 13   No results for input(s): AMMONIA in the last 168 hours. Coagulation Profile: No results for input(s): INR, PROTIME in the last 168 hours. Cardiac Enzymes: No results for input(s): CKTOTAL, CKMB, CKMBINDEX, TROPONINI in the last 168 hours. BNP (last 3 results) No results for input(s): PROBNP in the last 8760 hours. HbA1C: No results for input(s): HGBA1C in the last 72 hours. CBG: No results for input(s): GLUCAP in the last 168 hours. Lipid Profile: No results for input(s): CHOL, HDL, LDLCALC, TRIG, CHOLHDL, LDLDIRECT in the last 72 hours. Thyroid Function Tests: No results for input(s): TSH, T4TOTAL, FREET4, T3FREE, THYROIDAB in the last 72 hours. Anemia Panel: No results for input(s): VITAMINB12, FOLATE, FERRITIN, TIBC, IRON, RETICCTPCT in the last 72 hours. Urine analysis:    Component Value Date/Time   COLORURINE YELLOW 02/05/2016 1359   APPEARANCEUR CLEAR 02/05/2016 1359   LABSPEC 1.012 02/05/2016 1359   PHURINE 6.5 02/05/2016 1359   GLUCOSEU NEGATIVE 02/05/2016 1359   HGBUR NEGATIVE 02/05/2016 1359   BILIRUBINUR NEGATIVE 02/05/2016 1359   KETONESUR NEGATIVE 02/05/2016 1359   PROTEINUR NEGATIVE 02/05/2016 1359   UROBILINOGEN 1.0 11/20/2011 2046   NITRITE NEGATIVE 02/05/2016 1359   LEUKOCYTESUR NEGATIVE 02/05/2016 1359   Sepsis Labs: @LABRCNTIP (procalcitonin:4,lacticidven:4)  )No results found for this or any previous visit (from the past 240 hour(s)).   Invalid input(s): PROCALCITONIN, LACTICACIDVEN   Radiology Studies: Ct Abdomen Pelvis W Contrast  Result Date: 02/15/2017 CLINICAL DATA:  Abdominal pain with nausea and vomiting. History of bowel obstruction. EXAM: CT ABDOMEN AND PELVIS WITH CONTRAST TECHNIQUE: Multidetector CT imaging of the abdomen and pelvis was performed using the standard protocol following bolus administration of intravenous contrast. CONTRAST:  ISOVUE-300 IOPAMIDOL (ISOVUE-300) INJECTION 61% COMPARISON:   01/14/2011 FINDINGS: Lower chest: Atelectasis or consolidation in both lung bases. Can't exclude pneumonia. No pleural effusions. Hepatobiliary: No focal liver abnormality is seen. No gallstones, gallbladder wall thickening, or biliary dilatation. Pancreas: Unremarkable. No pancreatic ductal dilatation or surrounding inflammatory changes. Spleen: Normal in size without focal abnormality. Adrenals/Urinary Tract: Adrenal glands are unremarkable. Kidneys are normal, without renal calculi, focal lesion, or hydronephrosis. Bladder is unremarkable. Stomach/Bowel: Gas and fluid in the stomach without wall thickening. Dilated fluid and gas-filled proximal small bowel with air-fluid levels. Distal small bowel decompression. Transition zone is in the right lower anterior abdomen. This likely represents obstruction due to adhesions. There is a small ventral hernia containing partial wall of small bowel but this does not appear to be causing the obstruction. Colon is decompressed with scattered stool. No colonic wall thickening. Appendix is not identified. Vascular/Lymphatic: Aortic atherosclerosis. Focal aortic ectasia with maximal AP diameter of 2.8 cm. No enlarged abdominal or pelvic lymph nodes. Reproductive: Prostate gland is enlarged, measuring 5.3 cm diameter. Other: No free air  or free fluid in the abdomen. Musculoskeletal: Degenerative changes in the spine. No destructive bone lesions. IMPRESSION: Small bowel obstruction with transition zone in the right lower abdomen anteriorly. Causes likely due to adhesions. Atelectasis or consolidation in the lung bases. Prostate gland is enlarged. Electronically Signed   By: Burman Nieves M.D.   On: 02/15/2017 03:45        Scheduled Meds: . enoxaparin (LOVENOX) injection  40 mg Subcutaneous Q24H  . fluticasone furoate-vilanterol  1 puff Inhalation Daily   Continuous Infusions: . sodium chloride 125 mL/hr at 02/15/17 0620     LOS: 0 days    Time spent: 35  minutes    Athalene Kolle A, MD [facility] Pager [phone]  If 7PM-7AM, please contact night-coverage [email or web address] Password [email or web address] 02/15/2017, 11:15 AM

## 2017-02-15 NOTE — ED Notes (Signed)
Pt is aware he needs a urine sample unable to go at this time

## 2017-02-15 NOTE — H&P (Signed)
History and Physical  Patient Name: Shane Clark     MAU:[id number]    DOB: [birth year]    DOA: 02/15/2017 PCP: Simona Huh, MD   Patient coming from: Home  Chief Complaint: Abdominal pain an vomiting  HPI: Shane Clark is a 66 y.o. male with a past medical history significant for COPD, HTN, and SBO who presents with abdominal pain and vomiting for 3 days.  The patient was in his usual state of health until Thursday the day after his shoulder surgery when he developed vomiting. He and his wife initially thought this was just postoperative vomiting, Friday they got a Phenergan suppository from her orthopedist office, but by today the patient developed abdominal distention, intermittent colicky pain diffusely in the abdomen and persistent vomiting and dry heaves and couldn't keep any liquids or home medications down, so finally, they came to the emergency room.  ED course: -Afebrile, heart rate 56, respirations pulse is normal, blood pressure 108/78 -Na 135, K 4.0, Cr 1.08 (BUN 31), WBC 15.9K, Hgb 14.4 -Lipase normal, total bilirubin 1.4 -CT of the abdomen and pelvis shows small bowel obstruction with transition point in the right lower abdomen -Case is discussed with general surgery recommended NG tube and TRH were asked to evaluate for admission      ROS: Review of Systems  Gastrointestinal: Positive for abdominal pain, nausea and vomiting.  All other systems reviewed and are negative.         Past Medical History:  Diagnosis Date  . Allergy   . Arthritis   . Asthma   . BPH (benign prostatic hyperplasia)   . Chronic back pain    "all over"  . COPD (chronic obstructive pulmonary disease) ([place])   . GERD (gastroesophageal reflux disease)   . Hx of adenomatous colonic polyps 03/22/2016  . Hypertension   . Influenza 11/20/2011  . Sepsis ([place]) 02/05/2016    Past Surgical History:  Procedure Laterality Date  . ABDOMINAL HERNIA REPAIR    . ANTERIOR CERVICAL  DECOMP/DISCECTOMY FUSION     C6-8  . APPENDECTOMY  1960s  . BACK SURGERY    . COLONOSCOPY    . HERNIA REPAIR    . INSERTION DISTRACTION DEVICE LUMBAR POSTERIOR SPINOUS PROCESS    . KNEE ARTHROSCOPY Left   . SMALL BOWEL REPAIR      Social History: Patient lives with his wife.  The patient walks unassisted.  He worked in Teacher, adult education.  He is a former smoker.  He is from [place].    Allergies  Allergen Reactions  . Oxycodone-Acetaminophen Anaphylaxis  . Advair Diskus [Fluticasone-Salmeterol]     unknown  . Pentazocine Lactate Nausea Only and Other (See Comments)    Stopped breathing "talwin"    Family history: family history includes Coronary artery disease in his father; Diabetes type II in his other; Heart disease in his brother; Pancreatic cancer in his brother; Stroke in his brother.  Prior to Admission medications   Medication Sig Start Date End Date Taking? Authorizing Provider  albuterol (PROVENTIL HFA;VENTOLIN HFA) 108 (90 BASE) MCG/ACT inhaler Inhale 2 puffs into the lungs every 6 (six) hours as needed. Wheezing/shortness of breath    Yes Historical Provider, MD  aspirin EC 81 MG tablet Take 81 mg by mouth daily.     Yes Historical Provider, MD  budesonide-formoterol (SYMBICORT) 80-4.5 MCG/ACT inhaler Inhale 2 puffs into the lungs 2 (two) times daily.   Yes Historical Provider, MD  clobetasol cream (TEMOVATE) 0.05 % Apply  1 application topically every 14 (fourteen) days. 2 weeks of use then stop for 2 weeks, the repeat 12/18/15  Yes Historical Provider, MD  HYDROcodone-acetaminophen (NORCO) 7.5-325 MG tablet Take 1-2 tablets by mouth every 6 (six) hours as needed for moderate pain. 02/11/17  Yes Leandrew Koyanagi, MD  loratadine (CLARITIN) 10 MG tablet Take 10 mg by mouth daily.   Yes Historical Provider, MD  methocarbamol (ROBAXIN) 750 MG tablet Take 1 tablet (750 mg total) by mouth 2 (two) times daily as needed for muscle spasms. 02/11/17  Yes Naiping Ephriam Jenkins, MD  nitroGLYCERIN  (NITROSTAT) 0.4 MG SL tablet Place 0.4 mg under the tongue every 5 (five) minutes as needed for chest pain.   Yes Historical Provider, MD  ondansetron (ZOFRAN) 4 MG tablet Take 1-2 tablets (4-8 mg total) by mouth every 8 (eight) hours as needed for nausea or vomiting. 02/11/17  Yes Naiping Ephriam Jenkins, MD  pantoprazole (PROTONIX) 40 MG tablet Take 40 mg by mouth daily.     Yes Historical Provider, MD  promethazine (PHENERGAN) 25 MG suppository Place 1 suppository (25 mg total) rectally every 6 (six) hours as needed for nausea or vomiting. 02/13/17  Yes Suzan Slick, NP  senna-docusate (SENOKOT S) 8.6-50 MG tablet Take 1 tablet by mouth at bedtime as needed. Patient taking differently: Take 1 tablet by mouth at bedtime as needed for mild constipation.  02/11/17  Yes Leandrew Koyanagi, MD  Tamsulosin HCl (FLOMAX) 0.4 MG CAPS Take 0.4 mg by mouth daily.     Yes Historical Provider, MD  telmisartan (MICARDIS) 40 MG tablet Take 40 mg by mouth daily. 11/28/15  Yes Historical Provider, MD  promethazine (PHENERGAN) 25 MG tablet Take 1 tablet (25 mg total) by mouth every 6 (six) hours as needed for nausea. Patient not taking: Reported on 02/15/2017 02/11/17   Leandrew Koyanagi, MD       Physical Exam: BP 111/71   Pulse 62   Temp 98 F (36.7 C) (Oral)   Resp 18   SpO2 92%  General appearance: Well-developed, obese adult male, alert and in no acute distress.   Eyes: Anicteric, conjunctiva pink, lids and lashes normal. PERRL.    ENT: No nasal deformity, discharge, epistaxis.  Hearing normal. OP moist without lesions.   Neck: No neck masses.  Trachea midline.  No thyromegaly/tenderness. Lymph: No cervical or supraclavicular lymphadenopathy. Skin: Warm and dry.  No jaundice.  No suspicious rashes or lesions. Cardiac: RRR, nl S1-S2, no murmurs appreciated.  Capillary refill is brisk.  JVP normal.  No LE edema.  Radial and DP pulses 2+ and symmetric. Respiratory: Normal respiratory rate and rhythm.  CTAB without rales or  wheezes. Abdomen: Abdomen slightly distended.  Mild left sided TTP, with mild guarding. No ascites.   MSK: No deformities or effusions.  No cyanosis or clubbing. Neuro: Cranial nerves normal.  Sensation intact to light touch. Speech is fluent.  Muscle strength normal.    Psych: Sensorium intact and responding to questions, attention normal.  Behavior appropriate.  Affect normal.  Judgment and insight appear normal.     Labs on Admission:  I have personally reviewed following labs and imaging studies: CBC:  Recent Labs Lab 02/15/17 0055  WBC 15.9*  HGB 14.4  HCT 41.3  MCV 92.8  PLT 161   Basic Metabolic Panel:  Recent Labs Lab 02/15/17 0055  NA 135  K 4.0  CL 101  CO2 24  GLUCOSE 110*  BUN 31*  CREATININE  1.08  CALCIUM 8.9   GFR: Estimated Creatinine Clearance: 89.1 mL/min (by C-G formula based on SCr of 1.08 mg/dL).  Liver Function Tests:  Recent Labs Lab 02/15/17 0055  AST 27  ALT 37  ALKPHOS 61  BILITOT 1.4*  PROT 7.1  ALBUMIN 3.9    Recent Labs Lab 02/15/17 0055  LIPASE 13   No results for input(s): AMMONIA in the last 168 hours. Coagulation Profile: No results for input(s): INR, PROTIME in the last 168 hours. Cardiac Enzymes: No results for input(s): CKTOTAL, CKMB, CKMBINDEX, TROPONINI in the last 168 hours. BNP (last 3 results) No results for input(s): PROBNP in the last 8760 hours. HbA1C: No results for input(s): HGBA1C in the last 72 hours. CBG: No results for input(s): GLUCAP in the last 168 hours. Lipid Profile: No results for input(s): CHOL, HDL, LDLCALC, TRIG, CHOLHDL, LDLDIRECT in the last 72 hours. Thyroid Function Tests: No results for input(s): TSH, T4TOTAL, FREET4, T3FREE, THYROIDAB in the last 72 hours. Anemia Panel: No results for input(s): VITAMINB12, FOLATE, FERRITIN, TIBC, IRON, RETICCTPCT in the last 72 hours. Sepsis Labs: Lactate pending Invalid input(s): PROCALCITONIN, LACTICIDVEN No results found for this or any  previous visit (from the past 240 hour(s)).       Radiological Exams on Admission: Personally reviewed CT report: Ct Abdomen Pelvis W Contrast  Result Date: 02/15/2017 CLINICAL DATA:  Abdominal pain with nausea and vomiting. History of bowel obstruction. EXAM: CT ABDOMEN AND PELVIS WITH CONTRAST TECHNIQUE: Multidetector CT imaging of the abdomen and pelvis was performed using the standard protocol following bolus administration of intravenous contrast. CONTRAST:  188mL ISOVUE-300 IOPAMIDOL (ISOVUE-300) INJECTION 61% COMPARISON:  01/14/2011 FINDINGS: Lower chest: Atelectasis or consolidation in both lung bases. Can't exclude pneumonia. No pleural effusions. Hepatobiliary: No focal liver abnormality is seen. No gallstones, gallbladder wall thickening, or biliary dilatation. Pancreas: Unremarkable. No pancreatic ductal dilatation or surrounding inflammatory changes. Spleen: Normal in size without focal abnormality. Adrenals/Urinary Tract: Adrenal glands are unremarkable. Kidneys are normal, without renal calculi, focal lesion, or hydronephrosis. Bladder is unremarkable. Stomach/Bowel: Gas and fluid in the stomach without wall thickening. Dilated fluid and gas-filled proximal small bowel with air-fluid levels. Distal small bowel decompression. Transition zone is in the right lower anterior abdomen. This likely represents obstruction due to adhesions. There is a small ventral hernia containing partial wall of small bowel but this does not appear to be causing the obstruction. Colon is decompressed with scattered stool. No colonic wall thickening. Appendix is not identified. Vascular/Lymphatic: Aortic atherosclerosis. Focal aortic ectasia with maximal AP diameter of 2.8 cm. No enlarged abdominal or pelvic lymph nodes. Reproductive: Prostate gland is enlarged, measuring 5.3 cm diameter. Other: No free air or free fluid in the abdomen. Musculoskeletal: Degenerative changes in the spine. No destructive bone lesions.  IMPRESSION: Small bowel obstruction with transition zone in the right lower abdomen anteriorly. Causes likely due to adhesions. Atelectasis or consolidation in the lung bases. Prostate gland is enlarged. Electronically Signed   By: Lucienne Capers M.D.   On: 02/15/2017 03:45        Assessment/Plan Principal Problem:   Small bowel obstruction Active Problems:   COPD (chronic obstructive pulmonary disease) (HCC)   Essential hypertension  1. Small bowel obstruction:  Recurrent.  Had SBO in 2012 requiring LOA.  2 days of symptoms now, unable to tolerate PO, worsening.   -Nothing by mouth -Place NG tube -Maintenance fluids -Zofran for nausea, hydromorphone for pain -Gen. surgery consult, appreciate cares -Trend CMP and CBC  2. Hypertension:  Slightly hypotensive at admission. -Hold Micardis until able to take PO  3. COPD:  Not active -Continue Symbicort  4. Other medications:  -Resume tamsulosin and PPI when able to take PO  5. Leukocytosis:  Suspect demargination  6. Elevated bilirubin:  Trace elevation. -Trend, if flat, stop      DVT prophylaxis: Lovenox  Code Status: FULL  Family Communication: Wife at bedside  Disposition Plan: Anticipate IV fluids and conservative medical managemetn fo SBO Consults called: Gen Surg consulted by ED Admission status: INPATIENT        Medical decision making: Patient seen at 4:35 AM on 02/15/2017.  The patient was discussed with Dr. Kathrynn Humble.  What exists of the patient's chart was reviewed in depth and summarized above.  Clinical condition: stable.        Edwin Dada [facility] Pager [phone]         At the time of admission, it appears that the appropriate admission status for this patient is INPATIENT. This is judged to be reasonable and necessary in order to provide the required intensity of service to ensure the patient's safety given the presenting symptoms, physical exam findings, and  initial radiographic and laboratory data in the context of their chronic comorbidities.  Together, these circumstances are felt to place him at high risk for further clinical deterioration threatening life, limb, or organ.    I certify that at the point of admission it is my clinical judgment that the patient will require inpatient hospital care spanning beyond 2 midnights from the point of admission and that early discharge would result in unnecessary risk of decompensation and readmission or threat to life, limb or bodily function.

## 2017-02-15 NOTE — ED Triage Notes (Signed)
Pt with recent L rotator cuff sx. Pt with N/V x 3 days. Pt states taking PR phenergan, no improvement. Pt states 2 episodes of emesis today. Pt complaining of dark, concentrated urine. Pt complaining of some LUQ abdominal pain. Pt denies any drainage or odor to surgical site.

## 2017-02-15 NOTE — ED Provider Notes (Signed)
[facility] DEPT Provider Note   CSN: [id number] Arrival date & time: 02/15/17  0036     History   Chief Complaint Chief Complaint  Patient presents with  . Abdominal Pain  . Emesis    HPI Shane Clark is a 66 y.o. male with PMH/o HTN, SBO, who is 4 days post-op Left Rotator Cuff repair who presents with 3 days of progressively worsening LUQ and epigastric abdominal pain and nausea/vomiting. He describes his abdominal pain as a constant, sharp pressure and states that it currently is 10/10. His pain is worsened with movement and episodes of vomiting and improved with lying still. His emesis has been NBNB and has looked like the food he has been eating. He has not been able to tolerate any PO today. He initially was taking zofran for vomiting with no relief. He used phenergan suppository with temporary relief. His last bowel movement was this afternoon, but states it was smaller than normal. Wife also notes that patient had dark urine today, but denies any presence of blood. He denies any fever, eating any abnormal food.   The history is provided by the patient and the spouse.    Past Medical History:  Diagnosis Date  . Allergy   . Arthritis   . Asthma   . BPH (benign prostatic hyperplasia)   . Chronic back pain    "all over"  . COPD (chronic obstructive pulmonary disease) ([place])   . GERD (gastroesophageal reflux disease)   . Hx of adenomatous colonic polyps 03/22/2016  . Hypertension   . Influenza 11/20/2011  . Sepsis ([place]) 02/05/2016    Patient Active Problem List   Diagnosis Date Noted  . Small bowel obstruction 02/15/2017  . SLAP tear of shoulder 02/11/2017  . Impingement syndrome of left shoulder 02/05/2017  . Hx of adenomatous colonic polyps 03/22/2016  . Essential hypertension 02/05/2016  . COPD (chronic obstructive pulmonary disease) ([place]) 11/21/2011  . Diastolic dysfunction 00/17/4944  . BPH (benign prostatic hyperplasia) 11/21/2011  . Hypotension 11/20/2011      Past Surgical History:  Procedure Laterality Date  . ABDOMINAL HERNIA REPAIR    . ANTERIOR CERVICAL DECOMP/DISCECTOMY FUSION     C6-8  . APPENDECTOMY  1960s  . BACK SURGERY    . COLONOSCOPY    . HERNIA REPAIR    . INSERTION DISTRACTION DEVICE LUMBAR POSTERIOR SPINOUS PROCESS    . KNEE ARTHROSCOPY Left   . SMALL BOWEL REPAIR         Home Medications    Prior to Admission medications   Medication Sig Start Date End Date Taking? Authorizing Provider  albuterol (PROVENTIL HFA;VENTOLIN HFA) 108 (90 BASE) MCG/ACT inhaler Inhale 2 puffs into the lungs every 6 (six) hours as needed. Wheezing/shortness of breath    Yes Historical Provider, MD  aspirin EC 81 MG tablet Take 81 mg by mouth daily.     Yes Historical Provider, MD  budesonide-formoterol (SYMBICORT) 80-4.5 MCG/ACT inhaler Inhale 2 puffs into the lungs 2 (two) times daily.   Yes Historical Provider, MD  clobetasol cream (TEMOVATE) 9.67 % Apply 1 application topically every 14 (fourteen) days. 2 weeks of use then stop for 2 weeks, the repeat 12/18/15  Yes Historical Provider, MD  HYDROcodone-acetaminophen (NORCO) 7.5-325 MG tablet Take 1-2 tablets by mouth every 6 (six) hours as needed for moderate pain. 02/11/17  Yes Leandrew Koyanagi, MD  loratadine (CLARITIN) 10 MG tablet Take 10 mg by mouth daily.   Yes Historical Provider, MD  methocarbamol (  ROBAXIN) 750 MG tablet Take 1 tablet (750 mg total) by mouth 2 (two) times daily as needed for muscle spasms. 02/11/17  Yes Naiping Ephriam Jenkins, MD  nitroGLYCERIN (NITROSTAT) 0.4 MG SL tablet Place 0.4 mg under the tongue every 5 (five) minutes as needed for chest pain.   Yes Historical Provider, MD  ondansetron (ZOFRAN) 4 MG tablet Take 1-2 tablets (4-8 mg total) by mouth every 8 (eight) hours as needed for nausea or vomiting. 02/11/17  Yes Naiping Ephriam Jenkins, MD  pantoprazole (PROTONIX) 40 MG tablet Take 40 mg by mouth daily.     Yes Historical Provider, MD  promethazine (PHENERGAN) 25 MG suppository Place 1  suppository (25 mg total) rectally every 6 (six) hours as needed for nausea or vomiting. 02/13/17  Yes Suzan Slick, NP  senna-docusate (SENOKOT S) 8.6-50 MG tablet Take 1 tablet by mouth at bedtime as needed. Patient taking differently: Take 1 tablet by mouth at bedtime as needed for mild constipation.  02/11/17  Yes Leandrew Koyanagi, MD  Tamsulosin HCl (FLOMAX) 0.4 MG CAPS Take 0.4 mg by mouth daily.     Yes Historical Provider, MD  telmisartan (MICARDIS) 40 MG tablet Take 40 mg by mouth daily. 11/28/15  Yes Historical Provider, MD  promethazine (PHENERGAN) 25 MG tablet Take 1 tablet (25 mg total) by mouth every 6 (six) hours as needed for nausea. Patient not taking: Reported on 02/15/2017 02/11/17   Leandrew Koyanagi, MD    Family History Family History  Problem Relation Age of Onset  . Coronary artery disease Father   . Pancreatic cancer Brother   . Stroke Brother   . Heart disease Brother   . Diabetes type II Other   . Colon cancer Neg Hx   . Colon polyps Neg Hx   . Rectal cancer Neg Hx   . Stomach cancer Neg Hx     Social History Social History  Substance Use Topics  . Smoking status: Former Smoker    Packs/day: 0.12    Years: 20.00    Types: Cigarettes  . Smokeless tobacco: Former Systems developer     Comment: "quit smoking cigarettes in the 1980s"  . Alcohol use No     Allergies   Oxycodone-acetaminophen; Advair diskus [fluticasone-salmeterol]; and Pentazocine lactate   Review of Systems Review of Systems  Constitutional: Positive for appetite change and fatigue. Negative for chills and fever.  HENT: Negative for congestion and sore throat.   Eyes: Negative for visual disturbance.  Respiratory: Negative for cough and shortness of breath.   Cardiovascular: Negative for chest pain.  Gastrointestinal: Positive for abdominal pain, nausea and vomiting. Negative for blood in stool, constipation and diarrhea.  Genitourinary: Positive for decreased urine volume. Negative for difficulty  urinating, dysuria and hematuria.  Musculoskeletal: Negative for back pain and joint swelling.  Neurological: Positive for weakness. Negative for dizziness and headaches.  Psychiatric/Behavioral: Negative for confusion.  All other systems reviewed and are negative.    Physical Exam Updated Vital Signs BP 117/66 (BP Location: Right Arm)   Pulse 60   Temp 98.6 F (37 C) (Oral)   Resp 18   Ht 5\' 9"  (1.753 m)   Wt 120.7 kg   SpO2 96%   BMI 39.30 kg/m   Physical Exam  Constitutional: He is oriented to person, place, and time. He appears well-developed and well-nourished.  Appears uncomfortable. Intermittently dry heaving throughout exam.   HENT:  Head: Normocephalic and atraumatic.  Mouth/Throat: Oropharynx is clear and moist.  Mucous membranes are dry.  Eyes: Conjunctivae, EOM and lids are normal. Pupils are equal, round, and reactive to light.  Neck: Full passive range of motion without pain.  Cardiovascular: Regular rhythm, normal heart sounds and intact distal pulses.  Bradycardia present.  Exam reveals no gallop and no friction rub.   No murmur heard. Pulmonary/Chest: Effort normal and breath sounds normal.  Abdominal: He exhibits no distension and no fluid wave. There is tenderness in the epigastric area and left upper quadrant.  Moderate tenderness to LUQ and epigastric region. Minimal tenderness to right side of abdomen.  Well healed midline surgical scar.  Bowel sounds present in all 4 quadrants but hypoactive.  Musculoskeletal: Normal range of motion.  Left shoulder with sling in place and multiple bandages. Small incisions to anterior aspect of left shoulder with stitches in place. Slight erythema surrounding but no warmth or drainage.   Neurological: He is alert and oriented to person, place, and time.  Skin: Skin is warm and dry. Capillary refill takes 2 to 3 seconds. He is not diaphoretic. There is pallor.  Psychiatric: He has a normal mood and affect. His speech is  normal.  Nursing note and vitals reviewed.    ED Treatments / Results  Labs (all labs ordered are listed, but only abnormal results are displayed) Labs Reviewed  COMPREHENSIVE METABOLIC PANEL - Abnormal; Notable for the following:       Result Value   Glucose, Bld 110 (*)    BUN 31 (*)    Total Bilirubin 1.4 (*)    All other components within normal limits  CBC - Abnormal; Notable for the following:    WBC 15.9 (*)    All other components within normal limits  LIPASE, BLOOD  URINALYSIS, ROUTINE W REFLEX MICROSCOPIC  I-STAT CG4 LACTIC ACID, ED    EKG  EKG Interpretation None       Radiology Ct Abdomen Pelvis W Contrast  Result Date: 02/15/2017 CLINICAL DATA:  Abdominal pain with nausea and vomiting. History of bowel obstruction. EXAM: CT ABDOMEN AND PELVIS WITH CONTRAST TECHNIQUE: Multidetector CT imaging of the abdomen and pelvis was performed using the standard protocol following bolus administration of intravenous contrast. CONTRAST:  172mL ISOVUE-300 IOPAMIDOL (ISOVUE-300) INJECTION 61% COMPARISON:  01/14/2011 FINDINGS: Lower chest: Atelectasis or consolidation in both lung bases. Can't exclude pneumonia. No pleural effusions. Hepatobiliary: No focal liver abnormality is seen. No gallstones, gallbladder wall thickening, or biliary dilatation. Pancreas: Unremarkable. No pancreatic ductal dilatation or surrounding inflammatory changes. Spleen: Normal in size without focal abnormality. Adrenals/Urinary Tract: Adrenal glands are unremarkable. Kidneys are normal, without renal calculi, focal lesion, or hydronephrosis. Bladder is unremarkable. Stomach/Bowel: Gas and fluid in the stomach without wall thickening. Dilated fluid and gas-filled proximal small bowel with air-fluid levels. Distal small bowel decompression. Transition zone is in the right lower anterior abdomen. This likely represents obstruction due to adhesions. There is a small ventral hernia containing partial wall of small  bowel but this does not appear to be causing the obstruction. Colon is decompressed with scattered stool. No colonic wall thickening. Appendix is not identified. Vascular/Lymphatic: Aortic atherosclerosis. Focal aortic ectasia with maximal AP diameter of 2.8 cm. No enlarged abdominal or pelvic lymph nodes. Reproductive: Prostate gland is enlarged, measuring 5.3 cm diameter. Other: No free air or free fluid in the abdomen. Musculoskeletal: Degenerative changes in the spine. No destructive bone lesions. IMPRESSION: Small bowel obstruction with transition zone in the right lower abdomen anteriorly. Causes likely due to adhesions. Atelectasis  or consolidation in the lung bases. Prostate gland is enlarged. Electronically Signed   By: Lucienne Capers M.D.   On: 02/15/2017 03:45    Procedures Procedures (including critical care time)  Medications Ordered in ED Medications  fluticasone furoate-vilanterol (BREO ELLIPTA) 100-25 MCG/INH 1 puff (not administered)  enoxaparin (LOVENOX) injection 40 mg (not administered)  0.9 %  sodium chloride infusion ( Intravenous New Bag/Given 02/15/17 0620)  ondansetron (ZOFRAN) tablet 4 mg (not administered)    Or  ondansetron (ZOFRAN) injection 4 mg (not administered)  HYDROmorphone (DILAUDID) injection 1 mg (not administered)  ondansetron (ZOFRAN-ODT) disintegrating tablet 4 mg (4 mg Oral Given 02/15/17 0056)  sodium chloride 0.9 % bolus 1,000 mL (0 mLs Intravenous Stopped 02/15/17 0250)  HYDROmorphone (DILAUDID) injection 1 mg (1 mg Intravenous Given 02/15/17 0215)  promethazine (PHENERGAN) injection 12.5 mg (12.5 mg Intravenous Given 02/15/17 0215)  iopamidol (ISOVUE-300) 61 % injection (100 mLs  Contrast Given 02/15/17 0311)     Initial Impression / Assessment and Plan / ED Course  I have reviewed the triage vital signs and the nursing notes.  Pertinent labs & imaging results that were available during my care of the patient were reviewed by me and considered in my  medical decision making (see chart for details).  Clinical Course as of Feb 15 649  Sun Feb 15, 2017  0340 BUN: (!) 31 [LL]    Clinical Course User Index [LL] [place], [place]     Patient is 4 days post-op from left rotator cuff repair. Patient has a history of abdominal surgeries and previous SBO five years ago. Consider SBO vs gastritis vs pancreatitis. Will check basic labs, including CBC, CMP, UA, Lipase. Plan to rehydrate with 1L bolus of NSL. Will give phenergan and dilaudid for vomiting and pain. Patient notes an allergy to percocet but states he has had dilaudid before with no issue. Will check CT abdomen/pelvis to eval for SBO and other pathology, pending normal kidney function.   3:08 AM: Re-eval: Patient reports feeling improvement in symptoms after phenergan and dilaudid.   Labs reviewed. Lipase within normal limits. CBC with mild leukocytosis. BUN slightly elevated. Could be due to dehydration. Creatinine within normal limits.   4:14 AM: CT abd/pelvis positive for SBO. Plan to admit. Surgery consulted.     Final Clinical Impressions(s) / ED Diagnoses   Final diagnoses:  SBO (small bowel obstruction)    New Prescriptions Current Discharge Medication List       [place], [place] 02/15/17 [place], MD 02/16/17 [phone]

## 2017-02-16 ENCOUNTER — Inpatient Hospital Stay (HOSPITAL_COMMUNITY): Payer: Medicare Other

## 2017-02-16 ENCOUNTER — Encounter (HOSPITAL_COMMUNITY): Payer: Self-pay | Admitting: General Practice

## 2017-02-16 DIAGNOSIS — E669 Obesity, unspecified: Secondary | ICD-10-CM

## 2017-02-16 LAB — COMPREHENSIVE METABOLIC PANEL
ALT: 38 U/L (ref 17–63)
AST: 27 U/L (ref 15–41)
Albumin: 3.2 g/dL — ABNORMAL LOW (ref 3.5–5.0)
Alkaline Phosphatase: 55 U/L (ref 38–126)
Anion gap: 8 (ref 5–15)
BUN: 28 mg/dL — AB (ref 6–20)
CHLORIDE: 108 mmol/L (ref 101–111)
CO2: 22 mmol/L (ref 22–32)
Calcium: 8.1 mg/dL — ABNORMAL LOW (ref 8.9–10.3)
Creatinine, Ser: 0.91 mg/dL (ref 0.61–1.24)
GFR calc Af Amer: >60 mL/min (ref >60)
GLUCOSE: 77 mg/dL (ref 65–99)
Potassium: 4 mmol/L (ref 3.5–5.1)
Sodium: 138 mmol/L (ref 135–145)
Total Bilirubin: 1.4 mg/dL — ABNORMAL HIGH (ref 0.3–1.2)
Total Protein: 6.1 g/dL — ABNORMAL LOW (ref 6.5–8.1)

## 2017-02-16 LAB — CBC
HEMATOCRIT: 37.7 % — AB (ref 39.0–52.0)
Hemoglobin: 12.7 g/dL — ABNORMAL LOW (ref 13.0–17.0)
MCH: 31.6 pg (ref 26.0–34.0)
MCHC: 33.7 g/dL (ref 30.0–36.0)
MCV: 93.8 fL (ref 78.0–100.0)
Platelets: 188 10*3/uL (ref 150–400)
RBC: 4.02 MIL/uL — ABNORMAL LOW (ref 4.22–5.81)
RDW: 13.3 % (ref 11.5–15.5)
WBC: 11.9 10*3/uL — AB (ref 4.0–10.5)

## 2017-02-16 NOTE — Progress Notes (Signed)
PROGRESS NOTE    Shane Clark  NFA:[id number] DOB: 12-10-1950 DOA: 02/15/2017 PCP: Shane Huh, MD  Brief Narrative:  Shane Clark a 66 y.o.malewith a past medical history significant for COPD, HTN, and SBO in 2012who presents with abdominal pain and vomiting for 3 days. He was in his usual state of health until Thursday, the day after his shoulder surgery, when he developed vomiting. Friday he got a Phenergan suppository from the orthopedist office, but by today the patient developed abdominal distention, intermittent colicky pain diffusely in the abdomen and persistent vomiting and dry heaves and couldn't keep any liquids or home medications down.  Subjective: Patient states that he is feeling better. He has no had a bowel movement, but has been passing flatus this am. Denies nausea, vomiting, and abdominal pain. No chest pain or shortness of breath.  Assessment & Plan:   Principal Problem:   Small bowel obstruction Active Problems:   COPD (chronic obstructive pulmonary disease) ([facility])   Essential hypertension   Small Bowel Obstruction. Confirmed by CT. Recurrent- had SBO in 2012 requiring LOA. Symptomatically improving but no BM and still guarding on exam. Abdominal x-ray today showed gas distended loops measuring 6.7 cm. General surgery has been consulted- will await their input for further treatment. Continue NPO and maintenance IVF. Continue Zofran as needed for nausea and hydromorphone as needed for pain. Patient was encouraged to get up and walk around as much as possible.   Essential Hypertension. Stable. Hold Micardis until able to take PO.  Leukocytosis. Improving. WBC 11.9 today. Suspect demargination. Continue to monitor.   Elevated bilirubin. Unchanged. Trace elevation (1.4). Trend, if flat, stop  COPD. No acute complications. Continue Breo Ellipta.  Other medications: Resume tamsulosin and PPI when able to take PO   DVT prophylaxis: Lovenox Code  Status: Full Family Communication: No family at bedside Disposition Plan: home when ready   Consultants:   General surgery  Procedures:   Antimicrobials:  None   Objective: Vitals:   02/15/17 1414 02/15/17 1954 02/16/17 0354 02/16/17 0803  BP: 118/71 112/88 127/79   Pulse: 63 77 88   Resp: 18 19 19    Temp: 97.9 F (36.6 C) 98 F (36.7 C) 98.6 F (37 C)   TempSrc: Oral Oral Oral   SpO2: 95% 96% 97% 93%  Weight:      Height:        Intake/Output Summary (Last 24 hours) at 02/16/17 1003 Last data filed at 02/16/17 0600  Gross per 24 hour  Intake          3198.33 ml  Output             1460 ml  Net          1738.33 ml   Filed Weights   02/15/17 0606  Weight: 120.7 kg (266 lb 1.6 oz)    Examination:  General exam: Appears calm and comfortable  Respiratory system: Clear to auscultation. Respiratory effort normal. Cardiovascular system: S1 & S2 heard, RRR. No JVD, murmurs, rubs, gallops or clicks. No pedal edema. Gastrointestinal system: Abdomen is protuberant but soft with minimal tenderness and guarding. No organomegaly or masses felt. Bowel sounds heard in all quadrants. Central nervous system: Alert and oriented. No focal neurological deficits. Skin: No rashes, lesions or ulcers Psychiatry: Judgement and insight appear normal. Mood & affect appropriate.   Data Reviewed: I have personally reviewed following labs and imaging studies  CBC:  Recent Labs Lab 02/15/17 0055 02/16/17 0544  WBC  15.9* 11.9*  HGB 14.4 12.7*  HCT 41.3 37.7*  MCV 92.8 93.8  PLT 240 784   Basic Metabolic Panel:  Recent Labs Lab 02/15/17 0055 02/16/17 0544  NA 135 138  K 4.0 4.0  CL 101 108  CO2 24 22  GLUCOSE 110* 77  BUN 31* 28*  CREATININE 1.08 0.91  CALCIUM 8.9 8.1*   GFR: Estimated Creatinine Clearance: 103.8 mL/min (by C-G formula based on SCr of 0.91 mg/dL). Liver Function Tests:  Recent Labs Lab 02/15/17 0055 02/16/17 0544  AST 27 27  ALT 37 38    ALKPHOS 61 55  BILITOT 1.4* 1.4*  PROT 7.1 6.1*  ALBUMIN 3.9 3.2*    Recent Labs Lab 02/15/17 0055  LIPASE 13   No results for input(s): AMMONIA in the last 168 hours. Coagulation Profile: No results for input(s): INR, PROTIME in the last 168 hours. Cardiac Enzymes: No results for input(s): CKTOTAL, CKMB, CKMBINDEX, TROPONINI in the last 168 hours. BNP (last 3 results) No results for input(s): PROBNP in the last 8760 hours. HbA1C: No results for input(s): HGBA1C in the last 72 hours. CBG: No results for input(s): GLUCAP in the last 168 hours. Lipid Profile: No results for input(s): CHOL, HDL, LDLCALC, TRIG, CHOLHDL, LDLDIRECT in the last 72 hours. Thyroid Function Tests: No results for input(s): TSH, T4TOTAL, FREET4, T3FREE, THYROIDAB in the last 72 hours. Anemia Panel: No results for input(s): VITAMINB12, FOLATE, FERRITIN, TIBC, IRON, RETICCTPCT in the last 72 hours. Urine analysis:    Component Value Date/Time   COLORURINE YELLOW 02/15/2017 1534   APPEARANCEUR CLEAR 02/15/2017 1534   LABSPEC 1.038 (H) 02/15/2017 1534   PHURINE 5.0 02/15/2017 1534   GLUCOSEU NEGATIVE 02/15/2017 1534   HGBUR NEGATIVE 02/15/2017 1534   BILIRUBINUR NEGATIVE 02/15/2017 1534   KETONESUR NEGATIVE 02/15/2017 1534   PROTEINUR NEGATIVE 02/15/2017 1534   UROBILINOGEN 1.0 11/20/2011 2046   NITRITE NEGATIVE 02/15/2017 1534   LEUKOCYTESUR NEGATIVE 02/15/2017 1534   Sepsis Labs: @LABRCNTIP (procalcitonin:4,lacticidven:4)  )No results found for this or any previous visit (from the past 240 hour(s)).       Radiology Studies: Ct Abdomen Pelvis W Contrast  Result Date: 02/15/2017 CLINICAL DATA:  Abdominal pain with nausea and vomiting. History of bowel obstruction. EXAM: CT ABDOMEN AND PELVIS WITH CONTRAST TECHNIQUE: Multidetector CT imaging of the abdomen and pelvis was performed using the standard protocol following bolus administration of intravenous contrast. CONTRAST:  137mL ISOVUE-300  IOPAMIDOL (ISOVUE-300) INJECTION 61% COMPARISON:  01/14/2011 FINDINGS: Lower chest: Atelectasis or consolidation in both lung bases. Can't exclude pneumonia. No pleural effusions. Hepatobiliary: No focal liver abnormality is seen. No gallstones, gallbladder wall thickening, or biliary dilatation. Pancreas: Unremarkable. No pancreatic ductal dilatation or surrounding inflammatory changes. Spleen: Normal in size without focal abnormality. Adrenals/Urinary Tract: Adrenal glands are unremarkable. Kidneys are normal, without renal calculi, focal lesion, or hydronephrosis. Bladder is unremarkable. Stomach/Bowel: Gas and fluid in the stomach without wall thickening. Dilated fluid and gas-filled proximal small bowel with air-fluid levels. Distal small bowel decompression. Transition zone is in the right lower anterior abdomen. This likely represents obstruction due to adhesions. There is a small ventral hernia containing partial wall of small bowel but this does not appear to be causing the obstruction. Colon is decompressed with scattered stool. No colonic wall thickening. Appendix is not identified. Vascular/Lymphatic: Aortic atherosclerosis. Focal aortic ectasia with maximal AP diameter of 2.8 cm. No enlarged abdominal or pelvic lymph nodes. Reproductive: Prostate gland is enlarged, measuring 5.3 cm diameter. Other: No  free air or free fluid in the abdomen. Musculoskeletal: Degenerative changes in the spine. No destructive bone lesions. IMPRESSION: Small bowel obstruction with transition zone in the right lower abdomen anteriorly. Causes likely due to adhesions. Atelectasis or consolidation in the lung bases. Prostate gland is enlarged. Electronically Signed   By: Lucienne Capers M.D.   On: 02/15/2017 03:45   Dg Abd 2 Views  Result Date: 02/16/2017 CLINICAL DATA:  66 year old male with abdominal pain and distension. Subsequent encounter. EXAM: ABDOMEN - 2 VIEW COMPARISON:  02/15/2017 CT.  05/25/2013 plain film exam.  FINDINGS: Persistent small bowel obstructive pattern with gas distended small bowel loops measuring up to 6.7 cm with versus prior 6.2 cm. Folds appear slightly thickened. No obvious free intraperitoneal air or pneumatosis. Basilar atelectasis. IMPRESSION: Persistent small bowel obstructive pattern with gas distended loops slightly more prominent now measuring 6.7 cm versus prior 6.2 cm. Folds appear slightly thickened. Electronically Signed   By: Genia Del M.D.   On: 02/16/2017 07:31        Scheduled Meds: . enoxaparin (LOVENOX) injection  40 mg Subcutaneous Q24H  . fluticasone furoate-vilanterol  1 puff Inhalation Daily   Continuous Infusions: . sodium chloride 1,000 mL (02/16/17 0525)     LOS: 1 day    Time spent: 25 minutes    Aliene Altes, PA-S [facility] 02/16/2017   If 7PM-7AM, please contact night-coverage [email or web address] Password [email or web address] 02/16/2017, 10:03 AM   Birdie Hopes Pager: [phone] 02/16/2017, 12:02 PM

## 2017-02-16 NOTE — Progress Notes (Signed)
[facility] Progress Note     Subjective: No BM since Saturday night. No nausea or vomiting. + flatus. No acute events overnight. Pt states he is less bloated. No abdominal pain.  Objective: Vital signs in last 24 hours: Temp:  [97.9 F (36.6 C)-98.6 F (37 C)] 98.6 F (37 C) (03/19 0354) Pulse Rate:  [63-88] 88 (03/19 0354) Resp:  [18-19] 19 (03/19 0354) BP: (112-127)/(71-88) 127/79 (03/19 0354) SpO2:  [93 %-97 %] 93 % (03/19 0803)    Intake/Output from previous day: 03/18 0701 - 03/19 0700 In: 3198.3 [P.O.:240; I.V.:2958.3] Out: 1460 [Urine:1460] Intake/Output this shift: No intake/output data recorded.  PE: Gen:  Alert, NAD, pleasant, cooperative, well appearing Pulm:  Rate and effort normal Abd: Soft, obese, mild distention, +BS, TTP to right of umbilicus  Skin: no rashes noted, warm and dry  Lab Results:   Recent Labs  02/15/17 0055 02/16/17 0544  WBC 15.9* 11.9*  HGB 14.4 12.7*  HCT 41.3 37.7*  PLT 240 188   BMET  Recent Labs  02/15/17 0055 02/16/17 0544  NA 135 138  K 4.0 4.0  CL 101 108  CO2 24 22  GLUCOSE 110* 77  BUN 31* 28*  CREATININE 1.08 0.91  CALCIUM 8.9 8.1*   PT/INR No results for input(s): LABPROT, INR in the last 72 hours. CMP     Component Value Date/Time   NA 138 02/16/2017 0544   K 4.0 02/16/2017 0544   CL 108 02/16/2017 0544   CO2 22 02/16/2017 0544   GLUCOSE 77 02/16/2017 0544   BUN 28 (H) 02/16/2017 0544   CREATININE 0.91 02/16/2017 0544   CALCIUM 8.1 (L) 02/16/2017 0544   PROT 6.1 (L) 02/16/2017 0544   ALBUMIN 3.2 (L) 02/16/2017 0544   AST 27 02/16/2017 0544   ALT 38 02/16/2017 0544   ALKPHOS 55 02/16/2017 0544   BILITOT 1.4 (H) 02/16/2017 0544   GFRNONAA >60 02/16/2017 0544   GFRAA >60 02/16/2017 0544   Lipase     Component Value Date/Time   LIPASE 13 02/15/2017 0055       Studies/Results: Ct Abdomen Pelvis W Contrast  Result Date: 02/15/2017 CLINICAL DATA:  Abdominal pain with nausea  and vomiting. History of bowel obstruction. EXAM: CT ABDOMEN AND PELVIS WITH CONTRAST TECHNIQUE: Multidetector CT imaging of the abdomen and pelvis was performed using the standard protocol following bolus administration of intravenous contrast. CONTRAST:  174mL ISOVUE-300 IOPAMIDOL (ISOVUE-300) INJECTION 61% COMPARISON:  01/14/2011 FINDINGS: Lower chest: Atelectasis or consolidation in both lung bases. Can't exclude pneumonia. No pleural effusions. Hepatobiliary: No focal liver abnormality is seen. No gallstones, gallbladder wall thickening, or biliary dilatation. Pancreas: Unremarkable. No pancreatic ductal dilatation or surrounding inflammatory changes. Spleen: Normal in size without focal abnormality. Adrenals/Urinary Tract: Adrenal glands are unremarkable. Kidneys are normal, without renal calculi, focal lesion, or hydronephrosis. Bladder is unremarkable. Stomach/Bowel: Gas and fluid in the stomach without wall thickening. Dilated fluid and gas-filled proximal small bowel with air-fluid levels. Distal small bowel decompression. Transition zone is in the right lower anterior abdomen. This likely represents obstruction due to adhesions. There is a small ventral hernia containing partial wall of small bowel but this does not appear to be causing the obstruction. Colon is decompressed with scattered stool. No colonic wall thickening. Appendix is not identified. Vascular/Lymphatic: Aortic atherosclerosis. Focal aortic ectasia with maximal AP diameter of 2.8 cm. No enlarged abdominal or pelvic lymph nodes. Reproductive: Prostate gland is enlarged, measuring 5.3 cm diameter. Other: No free air  or free fluid in the abdomen. Musculoskeletal: Degenerative changes in the spine. No destructive bone lesions. IMPRESSION: Small bowel obstruction with transition zone in the right lower abdomen anteriorly. Causes likely due to adhesions. Atelectasis or consolidation in the lung bases. Prostate gland is enlarged. Electronically  Signed   By: Lucienne Capers M.D.   On: 02/15/2017 03:45   Dg Abd 2 Views  Result Date: 02/16/2017 CLINICAL DATA:  66 year old male with abdominal pain and distension. Subsequent encounter. EXAM: ABDOMEN - 2 VIEW COMPARISON:  02/15/2017 CT.  05/25/2013 plain film exam. FINDINGS: Persistent small bowel obstructive pattern with gas distended small bowel loops measuring up to 6.7 cm with versus prior 6.2 cm. Folds appear slightly thickened. No obvious free intraperitoneal air or pneumatosis. Basilar atelectasis. IMPRESSION: Persistent small bowel obstructive pattern with gas distended loops slightly more prominent now measuring 6.7 cm versus prior 6.2 cm. Folds appear slightly thickened. Electronically Signed   By: Genia Del M.D.   On: 02/16/2017 07:31    Anti-infectives: Anti-infectives    None       Assessment/Plan  HTN COPD Elevated bilirubin  SBO - films today show persistent SBO pattern. Folds appear slightly thickened - pt does not appear clinically obstructed, + flatus, no N or V - try clear liquids, if he develops N or V will need to place NGT  We will continue to follow   LOS: 1 day    Kalman Drape , [facility] Surgery 02/16/2017, 11:28 AM Pager: [phone] Consults: [phone] Mon-Fri 7:00 am-4:30 pm Sat-Sun 7:00 am-11:30 am

## 2017-02-17 NOTE — Progress Notes (Signed)
PROGRESS NOTE    Shane Clark  JJK:[id number] DOB: 1951/10/29 DOA: 02/15/2017 PCP: Simona Huh, MD  Brief Narrative:  Shane Clark a 66 y.o.malewith a past medical history significant for COPD, HTN, and SBO in 2012who presents with abdominal pain and vomiting for 3 days. He was in his usual state of health until Thursday, the day after his shoulder surgery, when he developed vomiting. Friday he got a Phenergan suppository from the orthopedist office, but by today the patient developed abdominal distention, intermittent colicky pain diffusely in the abdomen and persistent vomiting and dry heaves and couldn't keep any liquids or home medications down.  Subjective: Passing flatus but no bowel movements. Tolerate clear liquids. Per surgery notes advance diet  Assessment & Plan:   Principal Problem:   Small bowel obstruction Active Problems:   COPD (chronic obstructive pulmonary disease) (HCC)   Essential hypertension   Obesity (BMI 30-39.9)   Small Bowel Obstruction Confirmed by CT. Recurrent- had SBO in 2012 requiring LOA.  Abdominal x-ray today showed gas distended loops measuring 6.7 cm.  General surgery has been consulted- will await their input for further treatment.  On clear liquids tolerated, per general surgery advance diet.  Essential Hypertension. Stable. Hold Micardis until able to take PO.  Leukocytosis. Improving. WBC 11.9 today. Suspect demargination. Continue to monitor.   Elevated bilirubin. Unchanged. Trace elevation (1.4). Trend, if flat, stop  COPD. No acute complications. Continue Breo Ellipta.  Other medications: Resume tamsulosin and PPI when able to take PO   DVT prophylaxis: Lovenox Code Status: Full Family Communication: No family at bedside Disposition Plan: home when ready   Consultants:   General surgery  Procedures:   Antimicrobials:  None   Objective: Vitals:   02/16/17 0803 02/16/17 2202 02/17/17 0614 02/17/17  0840  BP:  (!) 145/70 134/71   Pulse:  65 70   Resp:  18 17   Temp:  98.5 F (36.9 C) 98.7 F (37.1 C)   TempSrc:  Oral Oral   SpO2: 93% 97% 98% 93%  Weight:      Height:        Intake/Output Summary (Last 24 hours) at 02/17/17 1245 Last data filed at 02/17/17 1000  Gross per 24 hour  Intake          3834.58 ml  Output             1150 ml  Net          2684.58 ml   Filed Weights   02/15/17 0606  Weight: 120.7 kg (266 lb 1.6 oz)    Examination:  General exam: Appears calm and comfortable  Respiratory system: Clear to auscultation. Respiratory effort normal. Cardiovascular system: S1 & S2 heard, RRR. No JVD, murmurs, rubs, gallops or clicks. No pedal edema. Gastrointestinal system: Abdomen is protuberant but soft with minimal tenderness and guarding. No organomegaly or masses felt. Bowel sounds heard in all quadrants. Central nervous system: Alert and oriented. No focal neurological deficits. Skin: No rashes, lesions or ulcers Psychiatry: Judgement and insight appear normal. Mood & affect appropriate.   Data Reviewed: I have personally reviewed following labs and imaging studies  CBC:  Recent Labs Lab 02/15/17 0055 02/16/17 0544  WBC 15.9* 11.9*  HGB 14.4 12.7*  HCT 41.3 37.7*  MCV 92.8 93.8  PLT 240 371   Basic Metabolic Panel:  Recent Labs Lab 02/15/17 0055 02/16/17 0544  NA 135 138  K 4.0 4.0  CL 101 108  CO2 24  22  GLUCOSE 110* 77  BUN 31* 28*  CREATININE 1.08 0.91  CALCIUM 8.9 8.1*   GFR: Estimated Creatinine Clearance: 103.8 mL/min (by C-G formula based on SCr of 0.91 mg/dL). Liver Function Tests:  Recent Labs Lab 02/15/17 0055 02/16/17 0544  AST 27 27  ALT 37 38  ALKPHOS 61 55  BILITOT 1.4* 1.4*  PROT 7.1 6.1*  ALBUMIN 3.9 3.2*    Recent Labs Lab 02/15/17 0055  LIPASE 13   No results for input(s): AMMONIA in the last 168 hours. Coagulation Profile: No results for input(s): INR, PROTIME in the last 168 hours. Cardiac  Enzymes: No results for input(s): CKTOTAL, CKMB, CKMBINDEX, TROPONINI in the last 168 hours. BNP (last 3 results) No results for input(s): PROBNP in the last 8760 hours. HbA1C: No results for input(s): HGBA1C in the last 72 hours. CBG: No results for input(s): GLUCAP in the last 168 hours. Lipid Profile: No results for input(s): CHOL, HDL, LDLCALC, TRIG, CHOLHDL, LDLDIRECT in the last 72 hours. Thyroid Function Tests: No results for input(s): TSH, T4TOTAL, FREET4, T3FREE, THYROIDAB in the last 72 hours. Anemia Panel: No results for input(s): VITAMINB12, FOLATE, FERRITIN, TIBC, IRON, RETICCTPCT in the last 72 hours. Urine analysis:    Component Value Date/Time   COLORURINE YELLOW 02/15/2017 1534   APPEARANCEUR CLEAR 02/15/2017 1534   LABSPEC 1.038 (H) 02/15/2017 1534   PHURINE 5.0 02/15/2017 1534   GLUCOSEU NEGATIVE 02/15/2017 1534   HGBUR NEGATIVE 02/15/2017 1534   BILIRUBINUR NEGATIVE 02/15/2017 1534   KETONESUR NEGATIVE 02/15/2017 1534   PROTEINUR NEGATIVE 02/15/2017 1534   UROBILINOGEN 1.0 11/20/2011 2046   NITRITE NEGATIVE 02/15/2017 1534   LEUKOCYTESUR NEGATIVE 02/15/2017 1534   Sepsis Labs: @LABRCNTIP (procalcitonin:4,lacticidven:4)  )No results found for this or any previous visit (from the past 240 hour(s)).       Radiology Studies: Dg Abd 2 Views  Result Date: 02/16/2017 CLINICAL DATA:  66 year old male with abdominal pain and distension. Subsequent encounter. EXAM: ABDOMEN - 2 VIEW COMPARISON:  02/15/2017 CT.  05/25/2013 plain film exam. FINDINGS: Persistent small bowel obstructive pattern with gas distended small bowel loops measuring up to 6.7 cm with versus prior 6.2 cm. Folds appear slightly thickened. No obvious free intraperitoneal air or pneumatosis. Basilar atelectasis. IMPRESSION: Persistent small bowel obstructive pattern with gas distended loops slightly more prominent now measuring 6.7 cm versus prior 6.2 cm. Folds appear slightly thickened.  Electronically Signed   By: Genia Del M.D.   On: 02/16/2017 07:31        Scheduled Meds: . enoxaparin (LOVENOX) injection  40 mg Subcutaneous Q24H  . fluticasone furoate-vilanterol  1 puff Inhalation Daily   Continuous Infusions: . sodium chloride 125 mL/hr at 02/17/17 0607     LOS: 2 days    Time spent: 25 minutes   If 7PM-7AM, please contact night-coverage [email or web address] Password [email or web address] 02/17/2017, 12:45 PM   Birdie Hopes, MD Pager: [phone] 02/17/2017, 12:45 PM

## 2017-02-17 NOTE — Progress Notes (Signed)
[facility] Progress Note     Subjective: Tolerating clears, + flatus, no abdominal pain. No new complaints.   Objective: Vital signs in last 24 hours: Temp:  [98.5 F (36.9 C)-98.7 F (37.1 C)] 98.7 F (37.1 C) (03/20 0614) Pulse Rate:  [65-70] 70 (03/20 0614) Resp:  [17-18] 17 (03/20 0614) BP: (134-145)/(70-71) 134/71 (03/20 0614) SpO2:  [93 %-98 %] 93 % (03/20 0840) Last BM Date: 02/14/17  Intake/Output from previous day: 03/19 0701 - 03/20 0700 In: 3834.6 [P.O.:820; I.V.:3014.6] Out: 925 [Urine:925] Intake/Output this shift: No intake/output data recorded.  PE: Gen:  Alert, NAD, pleasant, cooperative, well appearing Pulm:  Rate and effort normal Abd: Soft, obese, mild distention, +BS, no TTP Skin: no rashes noted, warm and dry  Lab Results:   Recent Labs  02/15/17 0055 02/16/17 0544  WBC 15.9* 11.9*  HGB 14.4 12.7*  HCT 41.3 37.7*  PLT 240 188   BMET  Recent Labs  02/15/17 0055 02/16/17 0544  NA 135 138  K 4.0 4.0  CL 101 108  CO2 24 22  GLUCOSE 110* 77  BUN 31* 28*  CREATININE 1.08 0.91  CALCIUM 8.9 8.1*   PT/INR No results for input(s): LABPROT, INR in the last 72 hours. CMP     Component Value Date/Time   NA 138 02/16/2017 0544   K 4.0 02/16/2017 0544   CL 108 02/16/2017 0544   CO2 22 02/16/2017 0544   GLUCOSE 77 02/16/2017 0544   BUN 28 (H) 02/16/2017 0544   CREATININE 0.91 02/16/2017 0544   CALCIUM 8.1 (L) 02/16/2017 0544   PROT 6.1 (L) 02/16/2017 0544   ALBUMIN 3.2 (L) 02/16/2017 0544   AST 27 02/16/2017 0544   ALT 38 02/16/2017 0544   ALKPHOS 55 02/16/2017 0544   BILITOT 1.4 (H) 02/16/2017 0544   GFRNONAA >60 02/16/2017 0544   GFRAA >60 02/16/2017 0544   Lipase     Component Value Date/Time   LIPASE 13 02/15/2017 0055       Studies/Results: Dg Abd 2 Views  Result Date: 02/16/2017 CLINICAL DATA:  66 year old male with abdominal pain and distension. Subsequent encounter. EXAM: ABDOMEN - 2 VIEW  COMPARISON:  02/15/2017 CT.  05/25/2013 plain film exam. FINDINGS: Persistent small bowel obstructive pattern with gas distended small bowel loops measuring up to 6.7 cm with versus prior 6.2 cm. Folds appear slightly thickened. No obvious free intraperitoneal air or pneumatosis. Basilar atelectasis. IMPRESSION: Persistent small bowel obstructive pattern with gas distended loops slightly more prominent now measuring 6.7 cm versus prior 6.2 cm. Folds appear slightly thickened. Electronically Signed   By: Genia Del M.D.   On: 02/16/2017 07:31    Anti-infectives: Anti-infectives    None       Assessment/Plan HTN COPD Elevated bilirubin  SBO - films yesterday show persistent SBO pattern. Folds appear slightly thickened - pt does not appear clinically obstructed, + flatus, no N or V - tolerated clear liquids - advance diet as tolerated  We will continue to follow   LOS: 2 days    Kalman Drape , [facility] Surgery 02/17/2017, 9:02 AM Pager: [phone] Consults: [phone] Mon-Fri 7:00 am-4:30 pm Sat-Sun 7:00 am-11:30 am

## 2017-02-18 NOTE — Final Consult Note (Signed)
Consultant Final Sign-Off Note    Assessment/Final recommendations  Shane Clark is a 66 y.o. male followed by me for SBO   Wound care (if applicable): N/A   Diet at discharge: soft diet   Activity at discharge: per primary team   Follow-up appointment:     Pending results:  Unresulted Labs    Start     Ordered   02/22/17 0500  Creatinine, serum  (enoxaparin (LOVENOX)    CrCl >/= 30 ml/min)  Weekly,   R    Comments:  while on enoxaparin therapy    02/15/17 0553       Medication recommendations: per primary   Other recommendations: no f/u necessary    Pt is clear for discharge from a surgical standpoint. Thank you for allowing Shane Clark to participate in the care of your patient!  Please consult Shane Clark again if you have further needs for your patient.  Kalman Drape 02/18/2017 9:23 AM    Subjective   Pt is tolerating diet and having BM's. No new complaints or acute events overnight.   Objective  Vital signs in last 24 hours: Temp:  [98.2 F (36.8 C)-98.7 F (37.1 C)] 98.7 F (37.1 C) (03/21 0604) Pulse Rate:  [56-88] 63 (03/21 0604) Resp:  [17-18] 18 (03/21 0604) BP: (127-149)/(71-87) 137/71 (03/21 0604) SpO2:  [96 %-98 %] 96 % (03/21 0842)  General: Physical Exam  Constitutional: He appears well-developed and well-nourished. No distress.  Cardiovascular: Normal rate, regular rhythm and normal heart sounds.   Abdominal: Soft. Bowel sounds are normal. He exhibits distension (mild). There is no tenderness. There is no rebound and no guarding.  Skin: Skin is warm and dry. He is not diaphoretic.  Psychiatric: He has a normal mood and affect. Judgment normal.     Pertinent labs and Studies:  Recent Labs  02/16/17 0544  WBC 11.9*  HGB 12.7*  HCT 37.7*   BMET  Recent Labs  02/16/17 0544  NA 138  K 4.0  CL 108  CO2 22  GLUCOSE 77  BUN 28*  CREATININE 0.91  CALCIUM 8.1*   No results for input(s): LABURIN in the last 72 hours. Results for orders  placed or performed during the hospital encounter of 02/05/16  Culture, blood (Routine X 2) w Reflex to ID Panel     Status: None   Collection Time: 02/05/16 11:15 AM  Result Value Ref Range Status   Specimen Description BLOOD LEFT HAND  Final   Special Requests BOTTLES DRAWN AEROBIC AND ANAEROBIC 4CC  Final   Culture  Setup Time   Final    GRAM POSITIVE COCCI IN CLUSTERS IN BOTH AEROBIC AND ANAEROBIC BOTTLES CRITICAL RESULT CALLED TO, READ BACK BY AND VERIFIED WITH: K DUFFY,RN @0727  02/06/16 MKELLY    Culture   Final    STAPHYLOCOCCUS SPECIES (COAGULASE NEGATIVE) THE SIGNIFICANCE OF ISOLATING THIS ORGANISM FROM A SINGLE SET OF BLOOD CULTURES WHEN MULTIPLE SETS ARE DRAWN IS UNCERTAIN. PLEASE NOTIFY THE MICROBIOLOGY DEPARTMENT WITHIN ONE WEEK IF SPECIATION AND SENSITIVITIES ARE REQUIRED.    Report Status 02/08/2016 FINAL  Final  Culture, blood (Routine X 2) w Reflex to ID Panel     Status: None   Collection Time: 02/05/16 11:36 AM  Result Value Ref Range Status   Specimen Description BLOOD RIGHT ANTECUBITAL  Final   Special Requests BOTTLES DRAWN AEROBIC AND ANAEROBIC 10CC  Final   Culture NO GROWTH 5 DAYS  Final   Report Status 02/10/2016 FINAL  Final  Urine culture     Status: None   Collection Time: 02/05/16  1:59 PM  Result Value Ref Range Status   Specimen Description URINE, CLEAN CATCH  Final   Special Requests NONE  Final   Culture NO GROWTH 1 DAY  Final   Report Status 02/06/2016 FINAL  Final  MRSA PCR Screening     Status: None   Collection Time: 02/05/16  4:09 PM  Result Value Ref Range Status   MRSA by PCR NEGATIVE NEGATIVE Final    Comment:        The GeneXpert MRSA Assay (FDA approved for NASAL specimens only), is one component of a comprehensive MRSA colonization surveillance program. It is not intended to diagnose MRSA infection nor to guide or monitor treatment for MRSA infections.     Imaging: No results found.  Jackson Latino, [facility]  Surgery Pager [phone]

## 2017-02-18 NOTE — Progress Notes (Signed)
Pt is anxious to go home, feeling a lot better. Discharge instructions given, verbalized understanding. Discharged to home.

## 2017-02-18 NOTE — Discharge Summary (Signed)
Physician Discharge Summary  Shane Clark YQM:[id number] DOB: Aug 08, 1951 DOA: 02/15/2017  PCP: Simona Huh, MD  Admit date: 02/15/2017 Discharge date: 02/18/2017  Admitted From:home Disposition:home  Recommendations for Outpatient Follow-up:  1. Follow up with PCP in 1-2 weeks 2. Please obtain BMP/CBC in one week  Home Health:no Equipment/Devices:no Discharge Condition:stable CODE STATUS:full Diet recommendation:heart healthy  Brief/Interim Summary:66 y.o.malewith a past medical history significant for COPD, HTN, and SBO in 2012who presents with abdominal pain and vomiting for 3 days. Patient recently had surgery of his shoulder and follows with orthopedics. On the day of admission patient developed abdominal distention associated with severe pain, vomiting and heaving. The patient was found to have a small bowel obstruction on CT scan of abdomen. This is his recurrent small bowel obstruction as patient had SBO in 2012. The patient was evaluated by general surgery. Her manage conservatively. Patient clinically improved. Today, he denied nausea vomiting, abdominal pain or abdominal distention. He is able to tolerate diet and having bowel movements. I discussed with the general surgeon Dr. Kieth Brightly, who recommended patient to discharge with outpatient follow-up with PCP.  Activity examination patient is eager to go home today. He denied fever, chills, headache, dizziness, nausea, vomiting, chest pain or shortness of breath. He is able to tolerate diet.  He was continued his home medications on discharge.  Discharge Diagnoses:  Principal Problem:   Small bowel obstruction Active Problems:   COPD (chronic obstructive pulmonary disease) (HCC)   Essential hypertension   Obesity (BMI 30-39.9)    Discharge Instructions  Discharge Instructions    Call MD for:  difficulty breathing, headache or visual disturbances    Complete by:  As directed    Call MD for:  extreme fatigue     Complete by:  As directed    Call MD for:  hives    Complete by:  As directed    Call MD for:  persistant dizziness or light-headedness    Complete by:  As directed    Call MD for:  persistant nausea and vomiting    Complete by:  As directed    Call MD for:  severe uncontrolled pain    Complete by:  As directed    Call MD for:  temperature >100.4    Complete by:  As directed    Diet - low sodium heart healthy    Complete by:  As directed    Discharge instructions    Complete by:  As directed    Please follow up with your PCP and orthopedics in 1-2 weeks.   Increase activity slowly    Complete by:  As directed      Allergies as of 02/18/2017      Reactions   Oxycodone-acetaminophen Anaphylaxis   Advair Diskus [fluticasone-salmeterol]    unknown   Pentazocine Lactate Nausea Only, Other (See Comments)   Stopped breathing "talwin"      Medication List    TAKE these medications   albuterol 108 (90 Base) MCG/ACT inhaler Commonly known as:  PROVENTIL HFA;VENTOLIN HFA Inhale 2 puffs into the lungs every 6 (six) hours as needed. Wheezing/shortness of breath   aspirin EC 81 MG tablet Take 81 mg by mouth daily.   budesonide-formoterol 80-4.5 MCG/ACT inhaler Commonly known as:  SYMBICORT Inhale 2 puffs into the lungs 2 (two) times daily.   clobetasol cream 0.05 % Commonly known as:  TEMOVATE Apply 1 application topically every 14 (fourteen) days. 2 weeks of use then stop for 2 weeks,  the repeat   HYDROcodone-acetaminophen 7.5-325 MG tablet Commonly known as:  NORCO Take 1-2 tablets by mouth every 6 (six) hours as needed for moderate pain.   loratadine 10 MG tablet Commonly known as:  CLARITIN Take 10 mg by mouth daily.   methocarbamol 750 MG tablet Commonly known as:  ROBAXIN Take 1 tablet (750 mg total) by mouth 2 (two) times daily as needed for muscle spasms.   nitroGLYCERIN 0.4 MG SL tablet Commonly known as:  NITROSTAT Place 0.4 mg under the tongue every 5 (five)  minutes as needed for chest pain.   ondansetron 4 MG tablet Commonly known as:  ZOFRAN Take 1-2 tablets (4-8 mg total) by mouth every 8 (eight) hours as needed for nausea or vomiting.   pantoprazole 40 MG tablet Commonly known as:  PROTONIX Take 40 mg by mouth daily.   promethazine 25 MG tablet Commonly known as:  PHENERGAN Take 1 tablet (25 mg total) by mouth every 6 (six) hours as needed for nausea.   promethazine 25 MG suppository Commonly known as:  PHENERGAN Place 1 suppository (25 mg total) rectally every 6 (six) hours as needed for nausea or vomiting.   senna-docusate 8.6-50 MG tablet Commonly known as:  SENOKOT S Take 1 tablet by mouth at bedtime as needed. What changed:  reasons to take this   tamsulosin 0.4 MG Caps capsule Commonly known as:  FLOMAX Take 0.4 mg by mouth daily.   telmisartan 40 MG tablet Commonly known as:  MICARDIS Take 40 mg by mouth daily.      Follow-up Information    Simona Huh, MD. Schedule an appointment as soon as possible for a visit in 1 week(s).   Specialty:  Family Medicine Contact information: [address] [place] [address] [phone]          Allergies  Allergen Reactions  . Oxycodone-Acetaminophen Anaphylaxis  . Advair Diskus [Fluticasone-Salmeterol]     unknown  . Pentazocine Lactate Nausea Only and Other (See Comments)    Stopped breathing "talwin"    Consultations: General surgery  Procedures/Studies: None  Subjective: Patient was seen and examined at bedside. He reported doing well and eager to go home today. Denied fever, headache, dizziness, nausea, vomiting, chest pain, shortness of breath or abdominal pain. Reported tolerating diet well and had bowel movement.  Discharge Exam: Vitals:   02/17/17 2147 02/18/17 0604  BP: (!) 149/87 137/71  Pulse: (!) 56 63  Resp: 17 18  Temp: 98.3 F (36.8 C) 98.7 F (37.1 C)   Vitals:   02/17/17 1413 02/17/17 2147 02/18/17 0604  02/18/17 0842  BP: 127/76 (!) 149/87 137/71   Pulse: 88 (!) 56 63   Resp:  17 18   Temp: 98.2 F (36.8 C) 98.3 F (36.8 C) 98.7 F (37.1 C)   TempSrc: Oral Oral Oral   SpO2: 97% 98% 98% 96%  Weight:      Height:        General: Pt is alert, awake, not in acute distress Cardiovascular: RRR, S1/S2 +, no rubs, no gallops Respiratory: CTA bilaterally, no wheezing, no rhonchi Abdominal: Soft, NT, ND, bowel sounds + Extremities: no edema, no cyanosis    The results of significant diagnostics from this hospitalization (including imaging, microbiology, ancillary and laboratory) are listed below for reference.     Microbiology: No results found for this or any previous visit (from the past 240 hour(s)).   Labs: BNP (last 3 results) No results for input(s): BNP  in the last 8760 hours. Basic Metabolic Panel:  Recent Labs Lab 02/15/17 0055 02/16/17 0544  NA 135 138  K 4.0 4.0  CL 101 108  CO2 24 22  GLUCOSE 110* 77  BUN 31* 28*  CREATININE 1.08 0.91  CALCIUM 8.9 8.1*   Liver Function Tests:  Recent Labs Lab 02/15/17 0055 02/16/17 0544  AST 27 27  ALT 37 38  ALKPHOS 61 55  BILITOT 1.4* 1.4*  PROT 7.1 6.1*  ALBUMIN 3.9 3.2*    Recent Labs Lab 02/15/17 0055  LIPASE 13   No results for input(s): AMMONIA in the last 168 hours. CBC:  Recent Labs Lab 02/15/17 0055 02/16/17 0544  WBC 15.9* 11.9*  HGB 14.4 12.7*  HCT 41.3 37.7*  MCV 92.8 93.8  PLT 240 188   Cardiac Enzymes: No results for input(s): CKTOTAL, CKMB, CKMBINDEX, TROPONINI in the last 168 hours. BNP: Invalid input(s): POCBNP CBG: No results for input(s): GLUCAP in the last 168 hours. D-Dimer No results for input(s): DDIMER in the last 72 hours. Hgb A1c No results for input(s): HGBA1C in the last 72 hours. Lipid Profile No results for input(s): CHOL, HDL, LDLCALC, TRIG, CHOLHDL, LDLDIRECT in the last 72 hours. Thyroid function studies No results for input(s): TSH, T4TOTAL, T3FREE,  THYROIDAB in the last 72 hours.  Invalid input(s): FREET3 Anemia work up No results for input(s): VITAMINB12, FOLATE, FERRITIN, TIBC, IRON, RETICCTPCT in the last 72 hours. Urinalysis    Component Value Date/Time   COLORURINE YELLOW 02/15/2017 1534   APPEARANCEUR CLEAR 02/15/2017 1534   LABSPEC 1.038 (H) 02/15/2017 1534   PHURINE 5.0 02/15/2017 1534   GLUCOSEU NEGATIVE 02/15/2017 1534   HGBUR NEGATIVE 02/15/2017 1534   BILIRUBINUR NEGATIVE 02/15/2017 1534   KETONESUR NEGATIVE 02/15/2017 1534   PROTEINUR NEGATIVE 02/15/2017 1534   UROBILINOGEN 1.0 11/20/2011 2046   NITRITE NEGATIVE 02/15/2017 1534   LEUKOCYTESUR NEGATIVE 02/15/2017 1534   Sepsis Labs Invalid input(s): PROCALCITONIN,  WBC,  LACTICIDVEN Microbiology No results found for this or any previous visit (from the past 240 hour(s)).   Time coordinating discharge: 27 minutes  SIGNED:   Rosita Fire, MD  [facility] 02/18/2017, 10:36 AM  If 7PM-7AM, please contact night-coverage [email or web address] Password [email or web address]

## 2017-02-26 ENCOUNTER — Ambulatory Visit (INDEPENDENT_AMBULATORY_CARE_PROVIDER_SITE_OTHER): Payer: Medicare Other | Admitting: Orthopaedic Surgery

## 2017-02-26 DIAGNOSIS — M7542 Impingement syndrome of left shoulder: Secondary | ICD-10-CM

## 2017-02-26 NOTE — Progress Notes (Signed)
Patient is 2 weeks status post left shoulder arthroscopy with biceps tenotomy and acromioplasty. He is doing well. He denies any pain. He was admitted to the hospital for a couple days for small bowel obstruction which did resolve. The denies any complaints of the shoulder. His range of motion and strength are appropriate. At this point begin physical therapy for strengthening and mobilization. Discontinue sling. No pain medicines needed. Follow-up in 4 weeks for recheck.

## 2017-03-26 ENCOUNTER — Encounter (INDEPENDENT_AMBULATORY_CARE_PROVIDER_SITE_OTHER): Payer: Self-pay | Admitting: Orthopaedic Surgery

## 2017-03-26 ENCOUNTER — Ambulatory Visit (INDEPENDENT_AMBULATORY_CARE_PROVIDER_SITE_OTHER): Payer: Medicare Other | Admitting: Orthopaedic Surgery

## 2017-03-26 DIAGNOSIS — M7542 Impingement syndrome of left shoulder: Secondary | ICD-10-CM

## 2017-03-26 NOTE — Progress Notes (Signed)
[name] is 6 weeks status post left shoulder scope and debridement he is doing well. He denies any pain. He is essentially resumed all of his ADLs without any difficulty. He is currently retired. He is very happy with his progress. At this point he has essentially reached MMI. He knows to give [place] a call if he has any issues otherwise I'll see him back as needed.

## 2017-03-27 ENCOUNTER — Telehealth: Payer: Medicare Other | Admitting: Nurse Practitioner

## 2017-03-27 DIAGNOSIS — J0100 Acute maxillary sinusitis, unspecified: Secondary | ICD-10-CM

## 2017-03-27 MED ORDER — AMOXICILLIN-POT CLAVULANATE 875-125 MG PO TABS: 1.0000 | ORAL_TABLET | Freq: Two times a day (BID) | ORAL | 0 refills | Status: DC

## 2017-03-27 NOTE — Progress Notes (Signed)

## 2017-04-10 MED FILL — TAMSULOSIN HCL 0.4 MG CAP: 0.4 | 90 days supply | Qty: 90 | Fill #1

## 2017-04-10 MED FILL — TELMISARTAN 40 MG TABLET: 40 | 90 days supply | Qty: 90 | Fill #1

## 2017-04-10 MED FILL — PANTOPRAZOLE SOD DR 40 MG T: 40 | 90 days supply | Qty: 90 | Fill #1

## 2017-04-10 MED FILL — SYMBICORT 80-4.5 MCG INH: 80-4.5 | 90 days supply | Qty: 31 | Fill #0

## 2017-06-12 NOTE — Anesthesia Postprocedure Evaluation (Signed)
Anesthesia Post Note  Patient: Shane Clark  Procedure(s) Performed: Procedure(s) (LRB): LEFT SHOULDER ARTHROSCOPY WITH DEBRIDEMENT, SUBACROMIAL DECOMPRESSION AND DISTAL CLAVICLE EXCISION (Left)     Anesthesia Post Evaluation  Last Vitals:  Vitals:   02/11/17 1112 02/11/17 1211  BP:  107/64  Pulse: 92 95  Resp: 17 18  Temp:  36.4 C    Last Pain:  Vitals:   02/12/17 1409  TempSrc:   PainSc: 0-No pain                 Jeronimo Hellberg EDWARD

## 2017-06-12 NOTE — Addendum Note (Signed)
Addendum  created 06/12/17 1441 by Lyndle Herrlich, MD   Sign clinical note

## 2017-07-13 MED FILL — PANTOPRAZOLE SOD DR 40 MG T: 40 | 90 days supply | Qty: 90 | Fill #2

## 2017-07-13 MED FILL — SYMBICORT 80-4.5 MCG INH: 80-4.5 | 90 days supply | Qty: 31 | Fill #1

## 2017-07-13 MED FILL — TELMISARTAN 40 MG TABLET: 40 | 90 days supply | Qty: 90 | Fill #0

## 2017-07-13 MED FILL — TAMSULOSIN HCL 0.4 MG CAP: 0.4 | 90 days supply | Qty: 90 | Fill #2

## 2017-10-12 MED FILL — TELMISARTAN 40 MG TABLET: 40 | 90 days supply | Qty: 90 | Fill #1

## 2017-10-12 MED FILL — SYMBICORT 80-4.5 MCG INH: 80-4.5 | 90 days supply | Qty: 31 | Fill #2

## 2017-10-12 MED FILL — PANTOPRAZOLE SOD DR 40 MG T: 40 | 90 days supply | Qty: 90 | Fill #0

## 2017-10-12 MED FILL — TAMSULOSIN HCL 0.4 MG CAP: 0.4 | 90 days supply | Qty: 90 | Fill #0

## 2017-12-11 ENCOUNTER — Other Ambulatory Visit: Payer: Self-pay | Admitting: Family Medicine

## 2017-12-11 ENCOUNTER — Ambulatory Visit
Admission: RE | Admit: 2017-12-11 | Discharge: 2017-12-11 | Disposition: A | Discharge status: Home / Self Care | Payer: Medicare Other | Source: Ambulatory Visit | Attending: Family Medicine | Admitting: Family Medicine

## 2017-12-11 DIAGNOSIS — R0600 Dyspnea, unspecified: Secondary | ICD-10-CM

## 2018-01-07 MED FILL — TAMSULOSIN HCL 0.4 MG CAP: 0.4 | 90 days supply | Qty: 90 | Fill #0

## 2018-01-07 MED FILL — TELMISARTAN 40 MG TABLET: 40 | 90 days supply | Qty: 90 | Fill #0

## 2018-01-07 MED FILL — SYMBICORT 80-4.5 MCG INH: 80-4.5 | 90 days supply | Qty: 31 | Fill #0

## 2018-01-07 MED FILL — PANTOPRAZOLE SOD DR 40 MG T: 40 | 90 days supply | Qty: 90 | Fill #0

## 2018-01-07 MED FILL — PROAIR HFA 90 MCG INHALER: 108 (90 BAS | 17 days supply | Qty: 9 | Fill #0

## 2018-02-12 ENCOUNTER — Encounter (HOSPITAL_COMMUNITY): Payer: Self-pay

## 2018-02-12 ENCOUNTER — Other Ambulatory Visit: Payer: Self-pay

## 2018-02-12 DIAGNOSIS — M1990 Unspecified osteoarthritis, unspecified site: Secondary | ICD-10-CM | POA: Diagnosis present

## 2018-02-12 DIAGNOSIS — J449 Chronic obstructive pulmonary disease, unspecified: Secondary | ICD-10-CM | POA: Diagnosis present

## 2018-02-12 DIAGNOSIS — K565 Intestinal adhesions [bands], unspecified as to partial versus complete obstruction: Principal | ICD-10-CM | POA: Diagnosis present

## 2018-02-12 DIAGNOSIS — Z6841 Body Mass Index (BMI) 40.0 and over, adult: Secondary | ICD-10-CM

## 2018-02-12 DIAGNOSIS — Z886 Allergy status to analgesic agent status: Secondary | ICD-10-CM

## 2018-02-12 DIAGNOSIS — Z9049 Acquired absence of other specified parts of digestive tract: Secondary | ICD-10-CM

## 2018-02-12 DIAGNOSIS — M549 Dorsalgia, unspecified: Secondary | ICD-10-CM | POA: Diagnosis present

## 2018-02-12 DIAGNOSIS — Z7982 Long term (current) use of aspirin: Secondary | ICD-10-CM

## 2018-02-12 DIAGNOSIS — D72829 Elevated white blood cell count, unspecified: Secondary | ICD-10-CM | POA: Diagnosis present

## 2018-02-12 DIAGNOSIS — E669 Obesity, unspecified: Secondary | ICD-10-CM | POA: Diagnosis present

## 2018-02-12 DIAGNOSIS — Z981 Arthrodesis status: Secondary | ICD-10-CM

## 2018-02-12 DIAGNOSIS — G8929 Other chronic pain: Secondary | ICD-10-CM | POA: Diagnosis present

## 2018-02-12 DIAGNOSIS — Z888 Allergy status to other drugs, medicaments and biological substances status: Secondary | ICD-10-CM

## 2018-02-12 DIAGNOSIS — Z7951 Long term (current) use of inhaled steroids: Secondary | ICD-10-CM

## 2018-02-12 DIAGNOSIS — Z792 Long term (current) use of antibiotics: Secondary | ICD-10-CM

## 2018-02-12 DIAGNOSIS — Z87891 Personal history of nicotine dependence: Secondary | ICD-10-CM

## 2018-02-12 DIAGNOSIS — Z79899 Other long term (current) drug therapy: Secondary | ICD-10-CM

## 2018-02-12 DIAGNOSIS — Z885 Allergy status to narcotic agent status: Secondary | ICD-10-CM

## 2018-02-12 DIAGNOSIS — Z86010 Personal history of colonic polyps: Secondary | ICD-10-CM

## 2018-02-12 DIAGNOSIS — K219 Gastro-esophageal reflux disease without esophagitis: Secondary | ICD-10-CM | POA: Diagnosis present

## 2018-02-12 DIAGNOSIS — R1084 Generalized abdominal pain: Secondary | ICD-10-CM | POA: Diagnosis not present

## 2018-02-12 DIAGNOSIS — I10 Essential (primary) hypertension: Secondary | ICD-10-CM | POA: Diagnosis present

## 2018-02-12 DIAGNOSIS — N400 Enlarged prostate without lower urinary tract symptoms: Secondary | ICD-10-CM | POA: Diagnosis present

## 2018-02-12 LAB — CBC
HCT: 41.2 % (ref 39.0–52.0)
Hemoglobin: 14.9 g/dL (ref 13.0–17.0)
MCH: 34 pg (ref 26.0–34.0)
MCHC: 36.2 g/dL — AB (ref 30.0–36.0)
MCV: 94.1 fL (ref 78.0–100.0)
Platelets: 247 10*3/uL (ref 150–400)
RBC: 4.38 MIL/uL (ref 4.22–5.81)
RDW: 13.2 % (ref 11.5–15.5)
WBC: 14.7 10*3/uL — AB (ref 4.0–10.5)

## 2018-02-12 LAB — COMPREHENSIVE METABOLIC PANEL
ALT: 23 U/L (ref 17–63)
AST: 28 U/L (ref 15–41)
Albumin: 4.2 g/dL (ref 3.5–5.0)
Alkaline Phosphatase: 69 U/L (ref 38–126)
Anion gap: 13 (ref 5–15)
BILIRUBIN TOTAL: 1.5 mg/dL — AB (ref 0.3–1.2)
BUN: 23 mg/dL — AB (ref 6–20)
CO2: 21 mmol/L — ABNORMAL LOW (ref 22–32)
CREATININE: 1.21 mg/dL (ref 0.61–1.24)
Calcium: 9.3 mg/dL (ref 8.9–10.3)
Chloride: 101 mmol/L (ref 101–111)
GFR calc Af Amer: >60 mL/min (ref >60)
Glucose, Bld: 141 mg/dL — ABNORMAL HIGH (ref 65–99)
POTASSIUM: 4.4 mmol/L (ref 3.5–5.1)
Sodium: 135 mmol/L (ref 135–145)
TOTAL PROTEIN: 7.8 g/dL (ref 6.5–8.1)

## 2018-02-12 LAB — LIPASE, BLOOD: Lipase: 29 U/L (ref 11–51)

## 2018-02-12 MED ORDER — IBUPROFEN 400 MG PO TABS
400.0000 mg | ORAL_TABLET | Freq: Once | ORAL | Status: AC | PRN
  Administered 2018-02-12: 400 mg via ORAL
  Filled 2018-02-12: qty 1

## 2018-02-12 MED ORDER — ONDANSETRON 4 MG PO TBDP
4.0000 mg | ORAL_TABLET | Freq: Once | ORAL | Status: AC | PRN
  Administered 2018-02-12: 4 mg via ORAL
  Filled 2018-02-12: qty 1

## 2018-02-12 NOTE — ED Triage Notes (Signed)
Patient from home with abdominal pain that began last evening after eating [employer]. Has history of small bowel obstructions.  Nausea and vomiting throughout today.

## 2018-02-13 ENCOUNTER — Inpatient Hospital Stay (HOSPITAL_COMMUNITY)
Admission: EM | Admit: 2018-02-13 | Discharge: 2018-02-17 | DRG: 389 | Disposition: A | Discharge status: Home / Self Care | Payer: Medicare Other | Attending: Internal Medicine | Admitting: Internal Medicine

## 2018-02-13 ENCOUNTER — Emergency Department (HOSPITAL_COMMUNITY): Payer: Medicare Other

## 2018-02-13 ENCOUNTER — Encounter (HOSPITAL_COMMUNITY): Payer: Self-pay | Admitting: Internal Medicine

## 2018-02-13 DIAGNOSIS — M1990 Unspecified osteoarthritis, unspecified site: Secondary | ICD-10-CM | POA: Diagnosis present

## 2018-02-13 DIAGNOSIS — Z888 Allergy status to other drugs, medicaments and biological substances status: Secondary | ICD-10-CM | POA: Diagnosis not present

## 2018-02-13 DIAGNOSIS — K56609 Unspecified intestinal obstruction, unspecified as to partial versus complete obstruction: Secondary | ICD-10-CM | POA: Diagnosis not present

## 2018-02-13 DIAGNOSIS — Z6841 Body Mass Index (BMI) 40.0 and over, adult: Secondary | ICD-10-CM | POA: Diagnosis not present

## 2018-02-13 DIAGNOSIS — Z7982 Long term (current) use of aspirin: Secondary | ICD-10-CM | POA: Diagnosis not present

## 2018-02-13 DIAGNOSIS — R1084 Generalized abdominal pain: Secondary | ICD-10-CM | POA: Diagnosis present

## 2018-02-13 DIAGNOSIS — E669 Obesity, unspecified: Secondary | ICD-10-CM | POA: Diagnosis present

## 2018-02-13 DIAGNOSIS — M549 Dorsalgia, unspecified: Secondary | ICD-10-CM | POA: Diagnosis present

## 2018-02-13 DIAGNOSIS — I10 Essential (primary) hypertension: Secondary | ICD-10-CM

## 2018-02-13 DIAGNOSIS — Z7951 Long term (current) use of inhaled steroids: Secondary | ICD-10-CM | POA: Diagnosis not present

## 2018-02-13 DIAGNOSIS — Z792 Long term (current) use of antibiotics: Secondary | ICD-10-CM | POA: Diagnosis not present

## 2018-02-13 DIAGNOSIS — D72829 Elevated white blood cell count, unspecified: Secondary | ICD-10-CM | POA: Diagnosis present

## 2018-02-13 DIAGNOSIS — J449 Chronic obstructive pulmonary disease, unspecified: Secondary | ICD-10-CM | POA: Diagnosis present

## 2018-02-13 DIAGNOSIS — Z885 Allergy status to narcotic agent status: Secondary | ICD-10-CM | POA: Diagnosis not present

## 2018-02-13 DIAGNOSIS — Z886 Allergy status to analgesic agent status: Secondary | ICD-10-CM | POA: Diagnosis not present

## 2018-02-13 DIAGNOSIS — N400 Enlarged prostate without lower urinary tract symptoms: Secondary | ICD-10-CM | POA: Diagnosis present

## 2018-02-13 DIAGNOSIS — K565 Intestinal adhesions [bands], unspecified as to partial versus complete obstruction: Secondary | ICD-10-CM | POA: Diagnosis present

## 2018-02-13 DIAGNOSIS — Z86010 Personal history of colonic polyps: Secondary | ICD-10-CM | POA: Diagnosis not present

## 2018-02-13 DIAGNOSIS — Z981 Arthrodesis status: Secondary | ICD-10-CM | POA: Diagnosis not present

## 2018-02-13 DIAGNOSIS — G8929 Other chronic pain: Secondary | ICD-10-CM | POA: Diagnosis present

## 2018-02-13 DIAGNOSIS — K219 Gastro-esophageal reflux disease without esophagitis: Secondary | ICD-10-CM | POA: Diagnosis present

## 2018-02-13 DIAGNOSIS — Z79899 Other long term (current) drug therapy: Secondary | ICD-10-CM | POA: Diagnosis not present

## 2018-02-13 DIAGNOSIS — Z87891 Personal history of nicotine dependence: Secondary | ICD-10-CM | POA: Diagnosis not present

## 2018-02-13 DIAGNOSIS — Z9049 Acquired absence of other specified parts of digestive tract: Secondary | ICD-10-CM | POA: Diagnosis not present

## 2018-02-13 DIAGNOSIS — Z8719 Personal history of other diseases of the digestive system: Secondary | ICD-10-CM

## 2018-02-13 LAB — URINALYSIS, ROUTINE W REFLEX MICROSCOPIC
BILIRUBIN URINE: NEGATIVE
Glucose, UA: NEGATIVE mg/dL
HGB URINE DIPSTICK: NEGATIVE
Ketones, ur: NEGATIVE mg/dL
Leukocytes, UA: NEGATIVE
Nitrite: NEGATIVE
PH: 5 (ref 5.0–8.0)
Protein, ur: NEGATIVE mg/dL
Specific Gravity, Urine: >1.046 — ABNORMAL HIGH (ref 1.005–1.030)

## 2018-02-13 LAB — BASIC METABOLIC PANEL
Anion gap: 8 (ref 5–15)
BUN: 24 mg/dL — AB (ref 6–20)
CALCIUM: 8.5 mg/dL — AB (ref 8.9–10.3)
CO2: 23 mmol/L (ref 22–32)
CREATININE: 1.21 mg/dL (ref 0.61–1.24)
Chloride: 105 mmol/L (ref 101–111)
GFR calc Af Amer: >60 mL/min (ref >60)
Glucose, Bld: 120 mg/dL — ABNORMAL HIGH (ref 65–99)
POTASSIUM: 4.9 mmol/L (ref 3.5–5.1)
SODIUM: 136 mmol/L (ref 135–145)

## 2018-02-13 LAB — GLUCOSE, CAPILLARY
GLUCOSE-CAPILLARY: 112 mg/dL — AB (ref 65–99)
GLUCOSE-CAPILLARY: 120 mg/dL — AB (ref 65–99)

## 2018-02-13 LAB — CBC
HCT: 40.5 % (ref 39.0–52.0)
Hemoglobin: 13.6 g/dL (ref 13.0–17.0)
MCH: 32.4 pg (ref 26.0–34.0)
MCHC: 33.6 g/dL (ref 30.0–36.0)
MCV: 96.4 fL (ref 78.0–100.0)
PLATELETS: 222 10*3/uL (ref 150–400)
RBC: 4.2 MIL/uL — ABNORMAL LOW (ref 4.22–5.81)
RDW: 13.8 % (ref 11.5–15.5)
WBC: 12.8 10*3/uL — AB (ref 4.0–10.5)

## 2018-02-13 MED ORDER — HYDRALAZINE HCL 20 MG/ML IJ SOLN: 10.0000 mg | INTRAMUSCULAR | Status: DC | PRN

## 2018-02-13 MED ORDER — HYDROMORPHONE HCL 1 MG/ML IJ SOLN
1.0000 mg | INTRAMUSCULAR | Status: DC | PRN
  Administered 2018-02-13: 0.5 mg via INTRAVENOUS
  Administered 2018-02-14: 1 mg via INTRAVENOUS
  Filled 2018-02-13: qty 1

## 2018-02-13 MED ORDER — HYDROMORPHONE HCL 1 MG/ML IJ SOLN
1.0000 mg | Freq: Once | INTRAMUSCULAR | Status: AC
  Administered 2018-02-13: 1 mg via INTRAVENOUS
  Filled 2018-02-13: qty 1

## 2018-02-13 MED ORDER — ENOXAPARIN SODIUM 40 MG/0.4ML ~~LOC~~ SOLN
40.0000 mg | Freq: Two times a day (BID) | SUBCUTANEOUS | Status: DC
  Administered 2018-02-13 – 2018-02-17 (×9): 40 mg via SUBCUTANEOUS
  Filled 2018-02-13 (×9): qty 0.4

## 2018-02-13 MED ORDER — NITROGLYCERIN 0.4 MG SL SUBL: 0.4000 mg | SUBLINGUAL_TABLET | SUBLINGUAL | Status: DC | PRN

## 2018-02-13 MED ORDER — DEXTROSE-NACL 5-0.9 % IV SOLN
INTRAVENOUS | Status: AC
  Administered 2018-02-13 (×2): via INTRAVENOUS

## 2018-02-13 MED ORDER — IOPAMIDOL (ISOVUE-300) INJECTION 61%
INTRAVENOUS | Status: AC
  Administered 2018-02-13: 100 mL
  Filled 2018-02-13: qty 100

## 2018-02-13 MED ORDER — ONDANSETRON HCL 4 MG/2ML IJ SOLN
4.0000 mg | Freq: Once | INTRAMUSCULAR | Status: AC
  Administered 2018-02-13: 4 mg via INTRAVENOUS
  Filled 2018-02-13: qty 2

## 2018-02-13 MED ORDER — ALBUTEROL SULFATE (2.5 MG/3ML) 0.083% IN NEBU: 3.0000 mL | INHALATION_SOLUTION | Freq: Four times a day (QID) | RESPIRATORY_TRACT | Status: DC | PRN

## 2018-02-13 MED ORDER — ONDANSETRON HCL 4 MG/2ML IJ SOLN
4.0000 mg | Freq: Four times a day (QID) | INTRAMUSCULAR | Status: DC | PRN
  Administered 2018-02-13 – 2018-02-14 (×3): 4 mg via INTRAVENOUS
  Filled 2018-02-13 (×3): qty 2

## 2018-02-13 MED ORDER — PROMETHAZINE HCL 25 MG/ML IJ SOLN
12.5000 mg | Freq: Four times a day (QID) | INTRAMUSCULAR | Status: DC | PRN
  Administered 2018-02-13: 12.5 mg via INTRAVENOUS
  Filled 2018-02-13: qty 1

## 2018-02-13 MED ORDER — HYDROMORPHONE HCL 1 MG/ML IJ SOLN
0.5000 mg | INTRAMUSCULAR | Status: DC | PRN
  Administered 2018-02-13 (×2): 0.5 mg via INTRAVENOUS
  Filled 2018-02-13 (×2): qty 1

## 2018-02-13 MED ORDER — MOMETASONE FURO-FORMOTEROL FUM 100-5 MCG/ACT IN AERO
2.0000 | INHALATION_SPRAY | Freq: Two times a day (BID) | RESPIRATORY_TRACT | Status: DC
  Administered 2018-02-13 – 2018-02-17 (×5): 2 via RESPIRATORY_TRACT
  Filled 2018-02-13: qty 8.8

## 2018-02-13 MED ORDER — SODIUM CHLORIDE 0.9 % IV BOLUS (SEPSIS)
1000.0000 mL | Freq: Once | INTRAVENOUS | Status: AC
  Administered 2018-02-13: 1000 mL via INTRAVENOUS

## 2018-02-13 MED ORDER — ONDANSETRON HCL 4 MG PO TABS: 4.0000 mg | ORAL_TABLET | Freq: Four times a day (QID) | ORAL | Status: DC | PRN

## 2018-02-13 NOTE — Progress Notes (Signed)
Called to room by pt in lots of pain, rated a 10, LLQ cramping, feels nauseated.  Dr Jenene Slicker called and came to room.

## 2018-02-13 NOTE — ED Provider Notes (Signed)
[facility] EMERGENCY DEPARTMENT Provider Note   CSN: [id number] Arrival date & time: 02/12/18  2112     History   Chief Complaint Chief Complaint  Patient presents with  . Abdominal Pain    HPI [name] is a 67 y.o. male.  Patient is a 67 year old male with past medical history of prior abdominal surgery to correct a partial small bowel obstruction, prior appendectomy, and prior hernia repair.  He presents today for evaluation of generalized abdominal pain, distention, and vomiting.  This is been going on since earlier this morning.  He denies any fevers or chills.  He denies any bloody stool or vomit.  He does report having 2 small bowel movements.  This feels similar to what he experienced with a prior bowel obstruction nearly 1 year ago.   The history is provided by the patient.  Abdominal Pain   This is a recurrent problem. The current episode started yesterday. The problem occurs constantly. The problem has been rapidly worsening. The pain is associated with an unknown factor. The pain is located in the generalized abdominal region. The quality of the pain is cramping. The pain is severe. Associated symptoms include nausea and vomiting. Pertinent negatives include flatus, melena and dysuria. Exacerbated by: Movement and palpation. Nothing relieves the symptoms.    Past Medical History:  Diagnosis Date  . Allergy   . Arthritis   . Asthma   . BPH (benign prostatic hyperplasia)   . Chronic back pain    "all over"  . COPD (chronic obstructive pulmonary disease) ([place])   . GERD (gastroesophageal reflux disease)   . Hx of adenomatous colonic polyps 03/22/2016  . Hypertension   . Influenza 11/20/2011  . Sepsis ([place]) 02/05/2016    Patient Active Problem List   Diagnosis Date Noted  . Obesity (BMI 30-39.9) 02/16/2017  . Small bowel obstruction ([place]) 02/15/2017  . SLAP tear of shoulder 02/11/2017  . Impingement syndrome of left shoulder 02/05/2017  .  Hx of adenomatous colonic polyps 03/22/2016  . Essential hypertension 02/05/2016  . COPD (chronic obstructive pulmonary disease) ([place]) 11/21/2011  . Diastolic dysfunction 01/60/1093  . BPH (benign prostatic hyperplasia) 11/21/2011  . Hypotension 11/20/2011    Past Surgical History:  Procedure Laterality Date  . ABDOMINAL HERNIA REPAIR    . ANTERIOR CERVICAL DECOMP/DISCECTOMY FUSION     C6-8  . APPENDECTOMY  1960s  . BACK SURGERY    . COLONOSCOPY    . HERNIA REPAIR    . INSERTION DISTRACTION DEVICE LUMBAR POSTERIOR SPINOUS PROCESS    . KNEE ARTHROSCOPY Left   . SHOULDER SURGERY Left 02/11/2017  . SMALL BOWEL REPAIR         Home Medications    Prior to Admission medications   Medication Sig Start Date End Date Taking? Authorizing Provider  albuterol (PROVENTIL HFA;VENTOLIN HFA) 108 (90 BASE) MCG/ACT inhaler Inhale 2 puffs into the lungs every 6 (six) hours as needed. Wheezing/shortness of breath     [provider]  amoxicillin-clavulanate (AUGMENTIN) 875-125 MG tablet Take 1 tablet by mouth 2 (two) times daily. 03/27/17   Hassell Done Mary-Margaret, FNP  aspirin EC 81 MG tablet Take 81 mg by mouth daily.      [provider]  budesonide-formoterol (SYMBICORT) 80-4.5 MCG/ACT inhaler Inhale 2 puffs into the lungs 2 (two) times daily.    [provider]  clobetasol cream (TEMOVATE) 2.35 % Apply 1 application topically every 14 (fourteen) days. 2 weeks of use then stop  for 2 weeks, the repeat 12/18/15   [provider]  HYDROcodone-acetaminophen (NORCO) 7.5-325 MG tablet Take 1-2 tablets by mouth every 6 (six) hours as needed for moderate pain. 02/11/17   Leandrew Koyanagi, MD  loratadine (CLARITIN) 10 MG tablet Take 10 mg by mouth daily.    [provider]  methocarbamol (ROBAXIN) 750 MG tablet Take 1 tablet (750 mg total) by mouth 2 (two) times daily as needed for muscle spasms. Patient not taking: Reported on 03/26/2017 02/11/17   Leandrew Koyanagi, MD    nitroGLYCERIN (NITROSTAT) 0.4 MG SL tablet Place 0.4 mg under the tongue every 5 (five) minutes as needed for chest pain.    [provider]  ondansetron (ZOFRAN) 4 MG tablet Take 1-2 tablets (4-8 mg total) by mouth every 8 (eight) hours as needed for nausea or vomiting. Patient not taking: Reported on 03/26/2017 02/11/17   Leandrew Koyanagi, MD  pantoprazole (PROTONIX) 40 MG tablet Take 40 mg by mouth daily.      [provider]  promethazine (PHENERGAN) 25 MG suppository Place 1 suppository (25 mg total) rectally every 6 (six) hours as needed for nausea or vomiting. Patient not taking: Reported on 03/26/2017 02/13/17   Suzan Slick, NP  promethazine (PHENERGAN) 25 MG tablet Take 1 tablet (25 mg total) by mouth every 6 (six) hours as needed for nausea. Patient not taking: Reported on 02/15/2017 02/11/17   Leandrew Koyanagi, MD  senna-docusate (SENOKOT S) 8.6-50 MG tablet Take 1 tablet by mouth at bedtime as needed. Patient not taking: Reported on 03/26/2017 02/11/17   Leandrew Koyanagi, MD  Tamsulosin HCl (FLOMAX) 0.4 MG CAPS Take 0.4 mg by mouth daily.      [provider]  telmisartan (MICARDIS) 40 MG tablet Take 40 mg by mouth daily. 11/28/15   [provider]    Family History Family History  Problem Relation Age of Onset  . Coronary artery disease Father   . Pancreatic cancer Brother   . Stroke Brother   . Heart disease Brother   . Diabetes type II Other   . Colon cancer Neg Hx   . Colon polyps Neg Hx   . Rectal cancer Neg Hx   . Stomach cancer Neg Hx     Social History Social History   Tobacco Use  . Smoking status: Former Smoker    Packs/day: 0.12    Years: 20.00    Pack years: 2.40    Types: Cigarettes  . Smokeless tobacco: Former Systems developer  . Tobacco comment: "quit smoking cigarettes in the 1980s"  Substance Use Topics  . Alcohol use: No    Alcohol/week: 0.0 oz  . Drug use: No     Allergies   Oxycodone-acetaminophen; Advair diskus  [fluticasone-salmeterol]; and Pentazocine lactate   Review of Systems Review of Systems  Gastrointestinal: Positive for abdominal pain, nausea and vomiting. Negative for flatus and melena.  Genitourinary: Negative for dysuria.  All other systems reviewed and are negative.    Physical Exam Updated Vital Signs BP 112/61 (BP Location: Left Arm)   Pulse 89   Temp 98.6 F (37 C) (Oral)   Resp 18   SpO2 96%   Physical Exam  Constitutional: He is oriented to person, place, and time. He appears well-developed and well-nourished. No distress.  HENT:  Head: Normocephalic and atraumatic.  Mouth/Throat: Oropharynx is clear and moist.  Neck: Normal range of motion. Neck supple.  Cardiovascular: Normal rate and regular rhythm. Exam reveals  no friction rub.  No murmur heard. Pulmonary/Chest: Effort normal and breath sounds normal. No respiratory distress. He has no wheezes. He has no rales.  Abdominal: Soft. Bowel sounds are normal. He exhibits no distension. There is generalized tenderness. There is rigidity. There is no rebound and no guarding.  There is generalized abdominal tenderness.  There is some distention and tympany to percussion.  Musculoskeletal: Normal range of motion. He exhibits no edema.  Neurological: He is alert and oriented to person, place, and time. Coordination normal.  Skin: Skin is warm and dry. He is not diaphoretic.  Nursing note and vitals reviewed.    ED Treatments / Results  Labs (all labs ordered are listed, but only abnormal results are displayed) Labs Reviewed  COMPREHENSIVE METABOLIC PANEL - Abnormal; Notable for the following components:      Result Value   CO2 21 (*)    Glucose, Bld 141 (*)    BUN 23 (*)    Total Bilirubin 1.5 (*)    All other components within normal limits  CBC - Abnormal; Notable for the following components:   WBC 14.7 (*)    MCHC 36.2 (*)    All other components within normal limits  LIPASE, BLOOD  URINALYSIS, ROUTINE W  REFLEX MICROSCOPIC    EKG  EKG Interpretation None       Radiology No results found.  Procedures Procedures (including critical care time)  Medications Ordered in ED Medications  ondansetron (ZOFRAN-ODT) disintegrating tablet 4 mg (4 mg Oral Given 02/12/18 2149)  ibuprofen (ADVIL,MOTRIN) tablet 400 mg (400 mg Oral Given 02/12/18 2149)  sodium chloride 0.9 % bolus 1,000 mL (1,000 mLs Intravenous New Bag/Given 02/13/18 0041)  ondansetron (ZOFRAN) injection 4 mg (4 mg Intravenous Given 02/13/18 0042)  HYDROmorphone (DILAUDID) injection 1 mg (1 mg Intravenous Given 02/13/18 0042)     Initial Impression / Assessment and Plan / ED Course  I have reviewed the triage vital signs and the nursing notes.  Pertinent labs & imaging results that were available during my care of the patient were reviewed by me and considered in my medical decision making (see chart for details).  Patient CT scan showing multiple dilated loops of small bowel with no definite transition point.  Findings possibly related to infectious or inflammatory enteritis, or possibly early small bowel obstruction.  Care discussed with Dr. Hal Hope who agrees to admit.  Dr. Kieth Brightly from general surgery also aware, but is operating on another patient and will evaluate this patient when surgery is complete.  Final Clinical Impressions(s) / ED Diagnoses   Final diagnoses:  None    ED Discharge Orders    None       Veryl Speak, MD 02/13/18 0225

## 2018-02-13 NOTE — Progress Notes (Signed)
Hospitalist progress note   Shane Clark  JEH:[id number] DOB: 03-May-1951 DOA: 02/13/2018 PCP: Gaynelle Arabian, MD   Specialists:  Gen surg  Brief Narrative:  67 year old male-known history of  ?COPD HTN 02/11/17 recent surgery shoulder   admission 3/18-3/21 small bowel obstruction Chronic underlying dyspnea status post workup with cardiology, normal cath x 3  SBO 2012-time did undergo extensive lysis of adhesions complicated by postop ileus    Assessment & Plan:   Assessment:  The primary encounter diagnosis was Generalized abdominal pain. A diagnosis of History of small bowel obstruction was also pertinent to this visit.  SBO-General surgery consulted regarding patient-patient preferring not to use NG tube at present as it could not be placed and we will monitor.-We will obtain KUB in the morning and monitor-continue D5, 0.9 saline 100 cc/h for now, pain not currently controlled on hydromorphone 0.5 mg every 4 so going to 1 mg as needed and control nausea with Zofran He is aware if there eis further pain we will try to place NGT-discussed with RN  Ectatic abdominal aorta-will need follow-up ultrasound in about 5 years  Hypertension-oral meds on hold.  Keep n.p.o.  Controlled with hydralazine 10 every 4 as needed  Leukocyytosis is probably reactive but not workup further at this stage unless symptomatic  DVT prophylaxis: lovenoex  Code Status:   full   Family Communication:   none Disposition Plan: ip   Consultants:   GI  Procedures:   none  Antimicrobials:   none   Subjective: Awake in sevre pain was doing better this am No fever no cp No vomit but "dry heaving" +flatus however this am  Objective: Vitals:   02/13/18 0115 02/13/18 0215 02/13/18 0300 02/13/18 0333  BP: (!) 104/55 (!) 104/59 (!) 108/54 128/69  Pulse: 87 65 68 70  Resp:      Temp:    98.3 F (36.8 C)  TempSrc:    Oral  SpO2: 90% 95% 97% 97%  Weight:    128.3 kg (282 lb 13.6 oz)  Height:     5\' 9"  (1.753 m)    Intake/Output Summary (Last 24 hours) at 02/13/2018 0858 Last data filed at 02/13/2018 0524 Gross per 24 hour  Intake 1148.33 ml  Output -  Net 1148.33 ml   Filed Weights   02/13/18 0333  Weight: 128.3 kg (282 lb 13.6 oz)    Examination:  Obese pleasant but in some pain s1 s 2no m/r/g cta b abd obese midline scar--do not appreicate bs, distended  No le edema   Data Reviewed: I have personally reviewed following labs and imaging studies  CBC: Recent Labs  Lab 02/12/18 2142 02/13/18 0450  WBC 14.7* 12.8*  HGB 14.9 13.6  HCT 41.2 40.5  MCV 94.1 96.4  PLT 247 378   Basic Metabolic Panel: Recent Labs  Lab 02/12/18 2142 02/13/18 0450  NA 135 136  K 4.4 4.9  CL 101 105  CO2 21* 23  GLUCOSE 141* 120*  BUN 23* 24*  CREATININE 1.21 1.21  CALCIUM 9.3 8.5*   GFR: Estimated Creatinine Clearance: 79.6 mL/min (by C-G formula based on SCr of 1.21 mg/dL). Liver Function Tests: Recent Labs  Lab 02/12/18 2142  AST 28  ALT 23  ALKPHOS 69  BILITOT 1.5*  PROT 7.8  ALBUMIN 4.2   Recent Labs  Lab 02/12/18 2142  LIPASE 29   No results for input(s): AMMONIA in the last 168 hours. Coagulation Profile: No results for input(s): INR, PROTIME in  the last 168 hours. Cardiac Enzymes: No results for input(s): CKTOTAL, CKMB, CKMBINDEX, TROPONINI in the last 168 hours. CBG: Recent Labs  Lab 02/13/18 0842  GLUCAP 112*   Urine analysis:    Component Value Date/Time   COLORURINE YELLOW 02/15/2017 1534   APPEARANCEUR CLEAR 02/15/2017 1534   LABSPEC 1.038 (H) 02/15/2017 1534   PHURINE 5.0 02/15/2017 1534   GLUCOSEU NEGATIVE 02/15/2017 1534   HGBUR NEGATIVE 02/15/2017 1534   BILIRUBINUR NEGATIVE 02/15/2017 1534   KETONESUR NEGATIVE 02/15/2017 1534   PROTEINUR NEGATIVE 02/15/2017 1534   UROBILINOGEN 1.0 11/20/2011 2046   NITRITE NEGATIVE 02/15/2017 1534   LEUKOCYTESUR NEGATIVE 02/15/2017 1534     Radiology Studies: Reviewed images personally in  health database    Scheduled Meds: . mometasone-formoterol  2 puff Inhalation BID   Continuous Infusions: . dextrose 5 % and 0.9% NaCl 100 mL/hr at 02/13/18 0355     LOS: 0 days    Time spent: [place], MD [facility] (P) [phone]   If 7PM-7AM, please contact night-coverage [email or web address] Password [facility] 02/13/2018, 8:58 AM

## 2018-02-13 NOTE — Progress Notes (Signed)
Patient ID: Shane Clark, male   DOB: August 02, 1951, 67 y.o.   MRN: [id number]       Subjective: Pt feels better this morning.  Passing flatus multiple times this am.  No nausea.  Abdominal pain improving.  Objective: Vital signs in last 24 hours: Temp:  [98.3 F (36.8 C)-98.6 F (37 C)] 98.3 F (36.8 C) (03/16 0333) Pulse Rate:  [65-103] 70 (03/16 0333) Resp:  [18] 18 (03/15 2327) BP: (104-128)/(54-74) 128/69 (03/16 0333) SpO2:  [90 %-97 %] 97 % (03/16 0333) Weight:  [282 lb 13.6 oz (128.3 kg)] 282 lb 13.6 oz (128.3 kg) (03/16 0333) Last BM Date: 02/12/18  Intake/Output from previous day: 03/15 0701 - 03/16 0700 In: 1148.3 [I.V.:148.3; IV Piggyback:1000] Out: -  Intake/Output this shift: No intake/output data recorded.  PE: Heart: regular Lungs: CTAB Abd: soft, obese, ND, +BS, incisional hernia small but reducible  Lab Results:  Recent Labs    02/12/18 2142 02/13/18 0450  WBC 14.7* 12.8*  HGB 14.9 13.6  HCT 41.2 40.5  PLT 247 222   BMET Recent Labs    02/12/18 2142 02/13/18 0450  NA 135 136  K 4.4 4.9  CL 101 105  CO2 21* 23  GLUCOSE 141* 120*  BUN 23* 24*  CREATININE 1.21 1.21  CALCIUM 9.3 8.5*   PT/INR No results for input(s): LABPROT, INR in the last 72 hours. CMP     Component Value Date/Time   NA 136 02/13/2018 0450   K 4.9 02/13/2018 0450   CL 105 02/13/2018 0450   CO2 23 02/13/2018 0450   GLUCOSE 120 (H) 02/13/2018 0450   BUN 24 (H) 02/13/2018 0450   CREATININE 1.21 02/13/2018 0450   CALCIUM 8.5 (L) 02/13/2018 0450   PROT 7.8 02/12/2018 2142   ALBUMIN 4.2 02/12/2018 2142   AST 28 02/12/2018 2142   ALT 23 02/12/2018 2142   ALKPHOS 69 02/12/2018 2142   BILITOT 1.5 (H) 02/12/2018 2142   GFRNONAA >60 02/13/2018 0450   GFRAA >60 02/13/2018 0450   Lipase     Component Value Date/Time   LIPASE 29 02/12/2018 2142       Studies/Results: Ct Abdomen Pelvis W Contrast  Result Date: 02/13/2018 CLINICAL DATA:  Acute onset of  generalized abdominal pain, nausea and vomiting. EXAM: CT ABDOMEN AND PELVIS WITH CONTRAST TECHNIQUE: Multidetector CT imaging of the abdomen and pelvis was performed using the standard protocol following bolus administration of intravenous contrast. CONTRAST:  143mL ISOVUE-300 IOPAMIDOL (ISOVUE-300) INJECTION 61% COMPARISON:  CT of the abdomen and pelvis from 02/15/2017 FINDINGS: Lower chest: Mild bibasilar opacities likely reflect atelectasis. The visualized portions of the mediastinum are unremarkable. Hepatobiliary: The liver is unremarkable in appearance. The gallbladder is unremarkable in appearance. The common bile duct remains normal in caliber. Pancreas: The pancreas is within normal limits. Spleen: The spleen is unremarkable in appearance. Adrenals/Urinary Tract: The adrenal glands are unremarkable in appearance. Mild nonspecific perinephric stranding is noted bilaterally. There is no evidence of hydronephrosis. No renal or ureteral stones are identified. A small right renal cyst is seen. Stomach/Bowel: The stomach is unremarkable in appearance. There is diffuse distention of small-bowel loops to 5.3 cm in maximal diameter, with gradual decompression. No focal transition point is seen. This is concerning for small bowel dysmotility, possibly reflecting an underlying infectious or inflammatory process. The patient is status post appendectomy. The colon is unremarkable in appearance. Vascular/Lymphatic: Scattered calcification is seen along the abdominal aorta and its branches. There is slight ectasia of  the distal abdominal aorta to 2.6 cm in AP dimension. The abdominal aorta is otherwise grossly unremarkable. The inferior vena cava is grossly unremarkable. No retroperitoneal lymphadenopathy is seen. No pelvic sidewall lymphadenopathy is identified. Reproductive: The bladder is mildly distended and grossly unremarkable. The prostate is enlarged, measuring 5.9 cm in AP dimension. Other: No additional soft  tissue abnormalities are seen. Musculoskeletal: No acute osseous abnormalities are identified. The visualized musculature is unremarkable in appearance. IMPRESSION: 1. Diffuse distention of small-bowel loops to 5.3 cm in maximal diameter, with gradual decompression. No focal transition point seen. This is concerning for small bowel dysmotility, possibly reflecting an underlying infectious or inflammatory process. 2. Small right renal cyst. 3. Ectatic abdominal aorta at risk for aneurysm development. Recommend followup by ultrasound in 5 years. This recommendation follows ACR consensus guidelines: White Paper of the ACR Incidental Findings Committee II on Vascular Findings. J Am Coll Radiol 2013; 10:789-794. 4. Enlarged prostate noted. 5. Mild bibasilar airspace opacities likely reflect atelectasis. Aortic Atherosclerosis (ICD10-I70.0). Electronically Signed   By: Garald Balding M.D.   On: 02/13/2018 01:39    Anti-infectives: Anti-infectives (From admission, onward)   None       Assessment/Plan Recurrent SBO -seems to be resolving as he is passing flatus -cont NPO for today since just admitted, but will allow sips of clears from the floor. -check abd films in am -mobilize -will follow   HTN - per medicine  FEN - NPO, x sips from floor VTE - SCDs, ok for DVT prophylaxis from our standpoint ID - none   LOS: 0 days    Henreitta Cea , [facility] Surgery 02/13/2018, 9:18 AM Pager: [phone] Consults: [phone] Mon-Fri 7:00 am-4:30 pm Sat-Sun 7:00 am-11:30 am

## 2018-02-13 NOTE — Consult Note (Signed)
Reason for Consult: abdominal pain Referring Physician: Brenten, Shane Clark is an 67 y.o. male.  HPI: 67 yo male with long history of bowel obstruction and has had 2 previous surgeries for bowel obstruction (2003 Ninfa Linden and 2012 Donne Hazel). HE is in his usual state of health until yesterday when he had constant abdominal pain. Pain was diffuse but most severe in Left upper quadrant. He had nausea, dry heaving and vomiting throughout the day. His last bowel movement was yesterday. He was having flatus throughout the day but not much since arriving to the ER. His current pain level is a 2.   Past Medical History:  Diagnosis Date  . Allergy   . Arthritis   . Asthma   . BPH (benign prostatic hyperplasia)   . Chronic back pain    "all over"  . COPD (chronic obstructive pulmonary disease) ([place])   . GERD (gastroesophageal reflux disease)   . Hx of adenomatous colonic polyps 03/22/2016  . Hypertension   . Influenza 11/20/2011  . Sepsis ([place]) 02/05/2016    Past Surgical History:  Procedure Laterality Date  . ABDOMINAL HERNIA REPAIR    . ANTERIOR CERVICAL DECOMP/DISCECTOMY FUSION     C6-8  . APPENDECTOMY  1960s  . BACK SURGERY    . COLONOSCOPY    . HERNIA REPAIR    . INSERTION DISTRACTION DEVICE LUMBAR POSTERIOR SPINOUS PROCESS    . KNEE ARTHROSCOPY Left   . SHOULDER SURGERY Left 02/11/2017  . SMALL BOWEL REPAIR      Family History  Problem Relation Age of Onset  . Coronary artery disease Father   . Pancreatic cancer Brother   . Stroke Brother   . Heart disease Brother   . Diabetes type II Other   . Colon cancer Neg Hx   . Colon polyps Neg Hx   . Rectal cancer Neg Hx   . Stomach cancer Neg Hx     Social History:  reports that he has quit smoking. His smoking use included cigarettes. He has a 2.40 pack-year smoking history. He has quit using smokeless tobacco. He reports that he does not drink alcohol or use drugs.  Allergies:  Allergies  Allergen Reactions  .  Oxycodone-Acetaminophen Anaphylaxis  . Advair Diskus [Fluticasone-Salmeterol]     unknown  . Pentazocine Lactate Nausea Only and Other (See Comments)    Stopped breathing "talwin"    Medications: I have reviewed the patient's current medications.  Results for orders placed or performed during the hospital encounter of 02/13/18 (from the past 48 hour(s))  Lipase, blood     Status: None   Collection Time: 02/12/18  9:42 PM  Result Value Ref Range   Lipase 29 11 - 51 U/L    Comment: Performed at [facility], [place] [address] [place], [place] [address]  Comprehensive metabolic panel     Status: Abnormal   Collection Time: 02/12/18  9:42 PM  Result Value Ref Range   Sodium 135 135 - 145 mmol/L   Potassium 4.4 3.5 - 5.1 mmol/L   Chloride 101 101 - 111 mmol/L   CO2 21 (L) 22 - 32 mmol/L   Glucose, Bld 141 (H) 65 - 99 mg/dL   BUN 23 (H) 6 - 20 mg/dL   Creatinine, Ser 1.21 0.61 - 1.24 mg/dL   Calcium 9.3 8.9 - 10.3 mg/dL   Total Protein 7.8 6.5 - 8.1 g/dL   Albumin 4.2 3.5 - 5.0 g/dL   AST 28 15 -  41 U/L   ALT 23 17 - 63 U/L   Alkaline Phosphatase 69 38 - 126 U/L   Total Bilirubin 1.5 (H) 0.3 - 1.2 mg/dL   GFR calc non Af Amer >60 >60 mL/min   GFR calc Af Amer >60 >60 mL/min    Comment: (NOTE) The eGFR has been calculated using the CKD EPI equation. This calculation has not been validated in all clinical situations. eGFR's persistently <60 mL/min signify possible Chronic Kidney Disease.    Anion gap 13 5 - 15    Comment: Performed at [facility] [address], [place], [place] [address]  CBC     Status: Abnormal   Collection Time: 02/12/18  9:42 PM  Result Value Ref Range   WBC 14.7 (H) 4.0 - 10.5 K/uL   RBC 4.38 4.22 - 5.81 MIL/uL   Hemoglobin 14.9 13.0 - 17.0 g/dL   HCT 41.2 39.0 - 52.0 %   MCV 94.1 78.0 - 100.0 fL   MCH 34.0 26.0 - 34.0 pg   MCHC 36.2 (H) 30.0 - 36.0 g/dL   RDW 13.2 11.5 - 15.5 %   Platelets 247 150 - 400 K/uL    Comment: Performed at  [facility], [place] [address], [place], [place] [address]    Ct Abdomen Pelvis W Contrast  Result Date: 02/13/2018 CLINICAL DATA:  Acute onset of generalized abdominal pain, nausea and vomiting. EXAM: CT ABDOMEN AND PELVIS WITH CONTRAST TECHNIQUE: Multidetector CT imaging of the abdomen and pelvis was performed using the standard protocol following bolus administration of intravenous contrast. CONTRAST:  13m ISOVUE-300 IOPAMIDOL (ISOVUE-300) INJECTION 61% COMPARISON:  CT of the abdomen and pelvis from 02/15/2017 FINDINGS: Lower chest: Mild bibasilar opacities likely reflect atelectasis. The visualized portions of the mediastinum are unremarkable. Hepatobiliary: The liver is unremarkable in appearance. The gallbladder is unremarkable in appearance. The common bile duct remains normal in caliber. Pancreas: The pancreas is within normal limits. Spleen: The spleen is unremarkable in appearance. Adrenals/Urinary Tract: The adrenal glands are unremarkable in appearance. Mild nonspecific perinephric stranding is noted bilaterally. There is no evidence of hydronephrosis. No renal or ureteral stones are identified. A small right renal cyst is seen. Stomach/Bowel: The stomach is unremarkable in appearance. There is diffuse distention of small-bowel loops to 5.3 cm in maximal diameter, with gradual decompression. No focal transition point is seen. This is concerning for small bowel dysmotility, possibly reflecting an underlying infectious or inflammatory process. The patient is status post appendectomy. The colon is unremarkable in appearance. Vascular/Lymphatic: Scattered calcification is seen along the abdominal aorta and its branches. There is slight ectasia of the distal abdominal aorta to 2.6 cm in AP dimension. The abdominal aorta is otherwise grossly unremarkable. The inferior vena cava is grossly unremarkable. No retroperitoneal lymphadenopathy is seen. No pelvic sidewall lymphadenopathy is identified.  Reproductive: The bladder is mildly distended and grossly unremarkable. The prostate is enlarged, measuring 5.9 cm in AP dimension. Other: No additional soft tissue abnormalities are seen. Musculoskeletal: No acute osseous abnormalities are identified. The visualized musculature is unremarkable in appearance. IMPRESSION: 1. Diffuse distention of small-bowel loops to 5.3 cm in maximal diameter, with gradual decompression. No focal transition point seen. This is concerning for small bowel dysmotility, possibly reflecting an underlying infectious or inflammatory process. 2. Small right renal cyst. 3. Ectatic abdominal aorta at risk for aneurysm development. Recommend followup by ultrasound in 5 years. This recommendation follows ACR consensus guidelines: White Paper of the ACR Incidental Findings Committee  II on Vascular Findings. J Am Coll Radiol 2013; 10:789-794. 4. Enlarged prostate noted. 5. Mild bibasilar airspace opacities likely reflect atelectasis. Aortic Atherosclerosis (ICD10-I70.0). Electronically Signed   By: Garald Balding M.D.   On: 02/13/2018 01:39    Review of Systems  Constitutional: Negative for chills and fever.  HENT: Negative for hearing loss.   Eyes: Negative for blurred vision and double vision.  Respiratory: Negative for cough and hemoptysis.   Cardiovascular: Negative for chest pain and palpitations.  Gastrointestinal: Positive for abdominal pain, nausea and vomiting. Negative for constipation.  Genitourinary: Negative for dysuria and urgency.  Musculoskeletal: Negative for myalgias and neck pain.  Skin: Negative for itching and rash.  Neurological: Negative for dizziness, tingling and headaches.  Endo/Heme/Allergies: Does not bruise/bleed easily.  Psychiatric/Behavioral: Negative for depression and suicidal ideas.   Blood pressure (!) 108/54, pulse 68, temperature 98.6 F (37 C), temperature source Oral, resp. rate 18, SpO2 97 %. Physical Exam  Vitals  reviewed. Constitutional: He is oriented to person, place, and time. He appears well-developed and well-nourished.  HENT:  Head: Normocephalic and atraumatic.  Eyes: Conjunctivae and EOM are normal. Pupils are equal, round, and reactive to light.  Neck: Normal range of motion. Neck supple.  Cardiovascular: Normal rate and regular rhythm.  Respiratory: Effort normal and breath sounds normal.  GI: Soft. Bowel sounds are normal. He exhibits no distension. There is no tenderness.  Small incisional hernia, reducible. Well healed midline scar  Musculoskeletal: Normal range of motion.  Neurological: He is alert and oriented to person, place, and time.  Skin: Skin is warm and dry.  Psychiatric: He has a normal mood and affect. His behavior is normal.    Assessment/Plan: 67 yo male with recurrent bowel obstructions. He is not currently nauseated and his pain and lessened with the ER pain medications. Also, he notes having difficulty placing NG tube last admission for bowel obstruction. -I think it is reasonable to watch and allow bowel rest and IV fluids to work. We did discuss that if he vomited today or started feeling worse that an NG tube would be the next step in treatment -we will continue to follow  Arta Bruce Tyquon Near 02/13/2018, 3:06 AM

## 2018-02-13 NOTE — H&P (Signed)
History and Physical    BUD KAESER AST:[id number] DOB: 10-20-1951 DOA: 02/13/2018  PCP: Gaynelle Arabian, MD  Patient coming from: Home.  Chief Complaint: Normal pain nausea vomiting.  HPI: JAQUANE BOUGHNER is a 67 y.o. male with previous history of bowel obstruction and also had required surgery presents to the ER with worsening abdominal pain over the last 24 hours with nausea vomiting.  Last bowel movement was 2 days ago.  Pain is generalized.  Since patient symptoms are worsening patient came to the ER.  ED Course: The ER on exam patient has distended abdomen with bowel sounds not appreciated.  CT of the abdomen shows dilated loops of bowel but no definite transition point.  Patient states he does not tolerate NG tube and so NG tube was not placed.  On-call general surgeon was consulted and patient admitted for further management of small bowel obstruction.  Review of Systems: As per HPI, rest all negative.   Past Medical History:  Diagnosis Date  . Allergy   . Arthritis   . Asthma   . BPH (benign prostatic hyperplasia)   . Chronic back pain    "all over"  . COPD (chronic obstructive pulmonary disease) ([place])   . GERD (gastroesophageal reflux disease)   . Hx of adenomatous colonic polyps 03/22/2016  . Hypertension   . Influenza 11/20/2011  . Sepsis ([place]) 02/05/2016    Past Surgical History:  Procedure Laterality Date  . ABDOMINAL HERNIA REPAIR    . ANTERIOR CERVICAL DECOMP/DISCECTOMY FUSION     C6-8  . APPENDECTOMY  1960s  . BACK SURGERY    . COLONOSCOPY    . HERNIA REPAIR    . INSERTION DISTRACTION DEVICE LUMBAR POSTERIOR SPINOUS PROCESS    . KNEE ARTHROSCOPY Left   . SHOULDER SURGERY Left 02/11/2017  . SMALL BOWEL REPAIR       reports that he has quit smoking. His smoking use included cigarettes. He has a 2.40 pack-year smoking history. He has quit using smokeless tobacco. He reports that he does not drink alcohol or use drugs.  Allergies  Allergen Reactions  .  Oxycodone-Acetaminophen Anaphylaxis  . Advair Diskus [Fluticasone-Salmeterol]     unknown  . Pentazocine Lactate Nausea Only and Other (See Comments)    Stopped breathing "talwin"    Family History  Problem Relation Age of Onset  . Coronary artery disease Father   . Pancreatic cancer Brother   . Stroke Brother   . Heart disease Brother   . Diabetes type II Other   . Colon cancer Neg Hx   . Colon polyps Neg Hx   . Rectal cancer Neg Hx   . Stomach cancer Neg Hx     Prior to Admission medications   Medication Sig Start Date End Date Taking? Authorizing Provider  albuterol (PROVENTIL HFA;VENTOLIN HFA) 108 (90 BASE) MCG/ACT inhaler Inhale 2 puffs into the lungs every 6 (six) hours as needed. Wheezing/shortness of breath     [provider]  amoxicillin-clavulanate (AUGMENTIN) 875-125 MG tablet Take 1 tablet by mouth 2 (two) times daily. 03/27/17   Hassell Done Mary-Margaret, FNP  aspirin EC 81 MG tablet Take 81 mg by mouth daily.      [provider]  budesonide-formoterol (SYMBICORT) 80-4.5 MCG/ACT inhaler Inhale 2 puffs into the lungs 2 (two) times daily.    [provider]  clobetasol cream (TEMOVATE) 9.89 % Apply 1 application topically every 14 (fourteen) days. 2 weeks of use then stop for 2 weeks,  the repeat 12/18/15   [provider]  HYDROcodone-acetaminophen (NORCO) 7.5-325 MG tablet Take 1-2 tablets by mouth every 6 (six) hours as needed for moderate pain. 02/11/17   Leandrew Koyanagi, MD  loratadine (CLARITIN) 10 MG tablet Take 10 mg by mouth daily.    [provider]  methocarbamol (ROBAXIN) 750 MG tablet Take 1 tablet (750 mg total) by mouth 2 (two) times daily as needed for muscle spasms. Patient not taking: Reported on 03/26/2017 02/11/17   Leandrew Koyanagi, MD  nitroGLYCERIN (NITROSTAT) 0.4 MG SL tablet Place 0.4 mg under the tongue every 5 (five) minutes as needed for chest pain.    [provider]  ondansetron (ZOFRAN) 4 MG tablet Take  1-2 tablets (4-8 mg total) by mouth every 8 (eight) hours as needed for nausea or vomiting. Patient not taking: Reported on 03/26/2017 02/11/17   Leandrew Koyanagi, MD  pantoprazole (PROTONIX) 40 MG tablet Take 40 mg by mouth daily.      [provider]  promethazine (PHENERGAN) 25 MG suppository Place 1 suppository (25 mg total) rectally every 6 (six) hours as needed for nausea or vomiting. Patient not taking: Reported on 03/26/2017 02/13/17   Suzan Slick, NP  promethazine (PHENERGAN) 25 MG tablet Take 1 tablet (25 mg total) by mouth every 6 (six) hours as needed for nausea. Patient not taking: Reported on 02/15/2017 02/11/17   Leandrew Koyanagi, MD  senna-docusate (SENOKOT S) 8.6-50 MG tablet Take 1 tablet by mouth at bedtime as needed. Patient not taking: Reported on 03/26/2017 02/11/17   Leandrew Koyanagi, MD  Tamsulosin HCl (FLOMAX) 0.4 MG CAPS Take 0.4 mg by mouth daily.      [provider]  telmisartan (MICARDIS) 40 MG tablet Take 40 mg by mouth daily. 11/28/15   [provider]    Physical Exam: Vitals:   02/12/18 2136 02/12/18 2327  BP: 111/74 112/61  Pulse: (!) 103 89  Resp: 18 18  Temp: 98.6 F (37 C)   TempSrc: Oral   SpO2: 97% 96%      Constitutional: Moderately built and nourished. Vitals:   02/12/18 2136 02/12/18 2327  BP: 111/74 112/61  Pulse: (!) 103 89  Resp: 18 18  Temp: 98.6 F (37 C)   TempSrc: Oral   SpO2: 97% 96%   Eyes: Anicteric no pallor. ENMT: No discharge from the ears eyes nose or mouth. Neck: No JVD appreciated no mass felt. Respiratory: No rhonchi or crepitations. Cardiovascular: S1-S2 heard no murmurs appreciated. Abdomen: Distended bowel sounds not appreciated.  Nontender no guarding or rigidity. Musculoskeletal: No edema. Skin: No rash. Neurologic: Alert awake oriented to time place and person.  Moves all extremities. Psychiatric: Appears normal.   Labs on Admission: I have personally reviewed following labs and imaging  studies  CBC: Recent Labs  Lab 02/12/18 2142  WBC 14.7*  HGB 14.9  HCT 41.2  MCV 94.1  PLT 852   Basic Metabolic Panel: Recent Labs  Lab 02/12/18 2142  NA 135  K 4.4  CL 101  CO2 21*  GLUCOSE 141*  BUN 23*  CREATININE 1.21  CALCIUM 9.3   GFR: CrCl cannot be calculated (Unknown ideal weight.). Liver Function Tests: Recent Labs  Lab 02/12/18 2142  AST 28  ALT 23  ALKPHOS 69  BILITOT 1.5*  PROT 7.8  ALBUMIN 4.2   Recent Labs  Lab 02/12/18 2142  LIPASE 29   No results for input(s): AMMONIA in the last 168 hours.  Coagulation Profile: No results for input(s): INR, PROTIME in the last 168 hours. Cardiac Enzymes: No results for input(s): CKTOTAL, CKMB, CKMBINDEX, TROPONINI in the last 168 hours. BNP (last 3 results) No results for input(s): PROBNP in the last 8760 hours. HbA1C: No results for input(s): HGBA1C in the last 72 hours. CBG: No results for input(s): GLUCAP in the last 168 hours. Lipid Profile: No results for input(s): CHOL, HDL, LDLCALC, TRIG, CHOLHDL, LDLDIRECT in the last 72 hours. Thyroid Function Tests: No results for input(s): TSH, T4TOTAL, FREET4, T3FREE, THYROIDAB in the last 72 hours. Anemia Panel: No results for input(s): VITAMINB12, FOLATE, FERRITIN, TIBC, IRON, RETICCTPCT in the last 72 hours. Urine analysis:    Component Value Date/Time   COLORURINE YELLOW 02/15/2017 1534   APPEARANCEUR CLEAR 02/15/2017 1534   LABSPEC 1.038 (H) 02/15/2017 1534   PHURINE 5.0 02/15/2017 1534   GLUCOSEU NEGATIVE 02/15/2017 1534   HGBUR NEGATIVE 02/15/2017 1534   BILIRUBINUR NEGATIVE 02/15/2017 1534   KETONESUR NEGATIVE 02/15/2017 1534   PROTEINUR NEGATIVE 02/15/2017 1534   UROBILINOGEN 1.0 11/20/2011 2046   NITRITE NEGATIVE 02/15/2017 1534   LEUKOCYTESUR NEGATIVE 02/15/2017 1534   Sepsis Labs: @LABRCNTIP (procalcitonin:4,lacticidven:4) )No results found for this or any previous visit (from the past 240 hour(s)).   Radiological Exams on  Admission: Ct Abdomen Pelvis W Contrast  Result Date: 02/13/2018 CLINICAL DATA:  Acute onset of generalized abdominal pain, nausea and vomiting. EXAM: CT ABDOMEN AND PELVIS WITH CONTRAST TECHNIQUE: Multidetector CT imaging of the abdomen and pelvis was performed using the standard protocol following bolus administration of intravenous contrast. CONTRAST:  134mL ISOVUE-300 IOPAMIDOL (ISOVUE-300) INJECTION 61% COMPARISON:  CT of the abdomen and pelvis from 02/15/2017 FINDINGS: Lower chest: Mild bibasilar opacities likely reflect atelectasis. The visualized portions of the mediastinum are unremarkable. Hepatobiliary: The liver is unremarkable in appearance. The gallbladder is unremarkable in appearance. The common bile duct remains normal in caliber. Pancreas: The pancreas is within normal limits. Spleen: The spleen is unremarkable in appearance. Adrenals/Urinary Tract: The adrenal glands are unremarkable in appearance. Mild nonspecific perinephric stranding is noted bilaterally. There is no evidence of hydronephrosis. No renal or ureteral stones are identified. A small right renal cyst is seen. Stomach/Bowel: The stomach is unremarkable in appearance. There is diffuse distention of small-bowel loops to 5.3 cm in maximal diameter, with gradual decompression. No focal transition point is seen. This is concerning for small bowel dysmotility, possibly reflecting an underlying infectious or inflammatory process. The patient is status post appendectomy. The colon is unremarkable in appearance. Vascular/Lymphatic: Scattered calcification is seen along the abdominal aorta and its branches. There is slight ectasia of the distal abdominal aorta to 2.6 cm in AP dimension. The abdominal aorta is otherwise grossly unremarkable. The inferior vena cava is grossly unremarkable. No retroperitoneal lymphadenopathy is seen. No pelvic sidewall lymphadenopathy is identified. Reproductive: The bladder is mildly distended and grossly  unremarkable. The prostate is enlarged, measuring 5.9 cm in AP dimension. Other: No additional soft tissue abnormalities are seen. Musculoskeletal: No acute osseous abnormalities are identified. The visualized musculature is unremarkable in appearance. IMPRESSION: 1. Diffuse distention of small-bowel loops to 5.3 cm in maximal diameter, with gradual decompression. No focal transition point seen. This is concerning for small bowel dysmotility, possibly reflecting an underlying infectious or inflammatory process. 2. Small right renal cyst. 3. Ectatic abdominal aorta at risk for aneurysm development. Recommend followup by ultrasound in 5 years. This recommendation follows ACR consensus guidelines: White Paper of the ACR Incidental Findings Committee II on  Vascular Findings. J Am Coll Radiol 2013; 10:789-794. 4. Enlarged prostate noted. 5. Mild bibasilar airspace opacities likely reflect atelectasis. Aortic Atherosclerosis (ICD10-I70.0). Electronically Signed   By: Garald Balding M.D.   On: 02/13/2018 01:39    Assessment/Plan Principal Problem:   Small bowel obstruction (HCC) Active Problems:   COPD (chronic obstructive pulmonary disease) (HCC)   Essential hypertension   SBO (small bowel obstruction) ([place])    1. Small bowel obstruction -patient will be kept n.p.o. IV fluids and pain relief medications.  Patient states he cannot tolerate the NG tube was not placed.  On-call general surgeon was consulted and will follow for further recommendations.  Will need serial KUBs.  CT scan report as mentioned possibility of infectious versus inflammatory changes. 2. Hypertension -since patient is n.p.o. I have placed patient on PRN IV hydralazine. 3. History of asthma not wheezing at this time continue inhalers. 4. History of BPH.   DVT prophylaxis: SCDs. Code Status: Full code.  Family Communication: Family at the bedside. Disposition Plan: Home. Consults called: General surgery. Admission status:  Inpatient.   Rise Patience MD [facility] Pager [phone].  If 7PM-7AM, please contact night-coverage [email or web address] Password [email or web address]  02/13/2018, 2:20 AM

## 2018-02-14 ENCOUNTER — Inpatient Hospital Stay (HOSPITAL_COMMUNITY): Payer: Medicare Other

## 2018-02-14 LAB — CBC
HCT: 38.2 % — ABNORMAL LOW (ref 39.0–52.0)
Hemoglobin: 13.2 g/dL (ref 13.0–17.0)
MCH: 33.2 pg (ref 26.0–34.0)
MCHC: 34.6 g/dL (ref 30.0–36.0)
MCV: 96.2 fL (ref 78.0–100.0)
PLATELETS: 222 10*3/uL (ref 150–400)
RBC: 3.97 MIL/uL — AB (ref 4.22–5.81)
RDW: 14 % (ref 11.5–15.5)
WBC: 11.2 10*3/uL — AB (ref 4.0–10.5)

## 2018-02-14 LAB — BASIC METABOLIC PANEL
ANION GAP: 8 (ref 5–15)
BUN: 16 mg/dL (ref 6–20)
CO2: 21 mmol/L — ABNORMAL LOW (ref 22–32)
Calcium: 8.1 mg/dL — ABNORMAL LOW (ref 8.9–10.3)
Chloride: 109 mmol/L (ref 101–111)
Creatinine, Ser: 1.06 mg/dL (ref 0.61–1.24)
GFR calc Af Amer: >60 mL/min (ref >60)
GLUCOSE: 107 mg/dL — AB (ref 65–99)
POTASSIUM: 4.4 mmol/L (ref 3.5–5.1)
Sodium: 138 mmol/L (ref 135–145)

## 2018-02-14 LAB — GLUCOSE, CAPILLARY
GLUCOSE-CAPILLARY: 103 mg/dL — AB (ref 65–99)
GLUCOSE-CAPILLARY: 103 mg/dL — AB (ref 65–99)
GLUCOSE-CAPILLARY: 116 mg/dL — AB (ref 65–99)

## 2018-02-14 MED ORDER — DEXTROSE-NACL 5-0.9 % IV SOLN
INTRAVENOUS | Status: AC
  Administered 2018-02-14 – 2018-02-15 (×3): via INTRAVENOUS

## 2018-02-14 MED ORDER — IRBESARTAN 150 MG PO TABS
150.0000 mg | ORAL_TABLET | Freq: Every day | ORAL | Status: DC
  Administered 2018-02-14 – 2018-02-17 (×4): 150 mg via ORAL
  Filled 2018-02-14 (×4): qty 1

## 2018-02-14 MED ORDER — PANTOPRAZOLE SODIUM 40 MG PO TBEC
40.0000 mg | DELAYED_RELEASE_TABLET | Freq: Every day | ORAL | Status: DC
  Administered 2018-02-14 – 2018-02-17 (×4): 40 mg via ORAL
  Filled 2018-02-14 (×4): qty 1

## 2018-02-14 MED ORDER — TAMSULOSIN HCL 0.4 MG PO CAPS
0.4000 mg | ORAL_CAPSULE | Freq: Every day | ORAL | Status: DC
  Administered 2018-02-14 – 2018-02-17 (×4): 0.4 mg via ORAL
  Filled 2018-02-14 (×4): qty 1

## 2018-02-14 NOTE — Progress Notes (Signed)
Patient ID: Shane Clark, male   DOB: 11/22/1951, 67 y.o.   MRN: [id number]   Acute Care Surgery Service Progress Note:    Chief Complaint/Subjective: Had bad episode of pain yesterday morning but feels much better last night and this am.  No n/v Lots of flatus per pt, some burping Didn't ambulate yesterday  Objective: Vital signs in last 24 hours: Temp:  [97.9 F (36.6 C)-98.9 F (37.2 C)] 98.9 F (37.2 C) (03/17 0639) Pulse Rate:  [57-73] 73 (03/17 0639) Resp:  [16-18] 18 (03/17 0639) BP: (111-124)/(57-60) 124/60 (03/17 0639) SpO2:  [95 %-98 %] 95 % (03/17 0639) Last BM Date: 02/12/18  Intake/Output from previous day: 03/16 0701 - 03/17 0700 In: 1232 [I.V.:1232] Out: 1100 [Urine:1100] Intake/Output this shift: No intake/output data recorded.  Lungs: cta, nonlabored  Cardiovascular: reg  Abd: soft, obese, old incisions, NT  Extremities: no edema, +SCDs  Neuro: alert, nonfocal  Lab Results: CBC  Recent Labs    02/13/18 0450 02/14/18 0545  WBC 12.8* 11.2*  HGB 13.6 13.2  HCT 40.5 38.2*  PLT 222 222   BMET Recent Labs    02/13/18 0450 02/14/18 0545  NA 136 138  K 4.9 4.4  CL 105 109  CO2 23 21*  GLUCOSE 120* 107*  BUN 24* 16  CREATININE 1.21 1.06  CALCIUM 8.5* 8.1*   LFT Hepatic Function Latest Ref Rng & Units 02/12/2018 02/16/2017 02/15/2017  Total Protein 6.5 - 8.1 g/dL 7.8 6.1(L) 7.1  Albumin 3.5 - 5.0 g/dL 4.2 3.2(L) 3.9  AST 15 - 41 U/L '28 27 27  '$ ALT 17 - 63 U/L 23 38 37  Alk Phosphatase 38 - 126 U/L 69 55 61  Total Bilirubin 0.3 - 1.2 mg/dL 1.5(H) 1.4(H) 1.4(H)  Bilirubin, Direct 0.0 - 0.3 mg/dL - - -   PT/INR No results for input(s): LABPROT, INR in the last 72 hours. ABG No results for input(s): PHART, HCO3 in the last 72 hours.  Invalid input(s): PCO2, PO2  Studies/Results:  Anti-infectives: Anti-infectives (From admission, onward)   None      Medications: Scheduled Meds: . enoxaparin (LOVENOX) injection  40 mg  Subcutaneous Q12H  . mometasone-formoterol  2 puff Inhalation BID   Continuous Infusions: . dextrose 5 % and 0.9% NaCl     PRN Meds:.albuterol, hydrALAZINE, HYDROmorphone (DILAUDID) injection, nitroGLYCERIN, ondansetron **OR** ondansetron (ZOFRAN) IV, promethazine  Assessment/Plan: Patient Active Problem List   Diagnosis Date Noted  . SBO (small bowel obstruction) ([place]) 02/13/2018  . Obesity (BMI 30-39.9) 02/16/2017  . Small bowel obstruction ([place]) 02/15/2017  . SLAP tear of shoulder 02/11/2017  . Impingement syndrome of left shoulder 02/05/2017  . Hx of adenomatous colonic polyps 03/22/2016  . Essential hypertension 02/05/2016  . COPD (chronic obstructive pulmonary disease) ([place]) 11/21/2011  . Diastolic dysfunction 85/01/7740  . BPH (benign prostatic hyperplasia) 11/21/2011  . Hypotension 11/20/2011   Recurrent SBO -seems to be resolving as he is passing flatus -xrays this am show air throughout entire intestinal tract but still a few dilated loops -will allow clear liquids today, told pt to take slow -mobilize -will follow   HTN - per medicine  FEN - clears as tolerated VTE - SCDs, ok for DVT prophylaxis from our standpoint ID - none   Disposition:  LOS: 1 day    Leighton Ruff. Redmond Pulling, MD, FACS General, Bariatric, & Minimally Invasive Surgery [phone] [facility]

## 2018-02-14 NOTE — Progress Notes (Signed)
Hospitalist progress note   Shane Clark  HYQ:[id number] DOB: 07-26-1951 DOA: 02/13/2018 PCP: Gaynelle Arabian, MD   Specialists:  Gen surg  Brief Narrative:  67 year old male-known history of  ?COPD HTN 02/11/17 recent surgery shoulder   admission 3/18-3/21 small bowel obstruction Chronic underlying dyspnea status post workup with cardiology, normal cath x 3  SBO 2012-time did undergo extensive lysis of adhesions complicated by postop ileus    Assessment & Plan:   Assessment:  The primary encounter diagnosis was Generalized abdominal pain. Diagnoses of History of small bowel obstruction and SBO (small bowel obstruction) ([place]) were also pertinent to this visit.  SBO-General surgery consulted regarding patient-patient preferring not to use NG tube at present as it could not be placed and we will monitor.- Still dil bowel loops per my read axr continue D5, 0.9 saline 100 cc/h for now, pain controlled on hydromorphone 1 mg as needed and control nausea with Zofran No need NGT at this time  Prior dyspena-probably restricitve component-no work-up in house  Ectatic abdominal aorta-will need follow-up ultrasound in about 5 years  Hypertension-oral meds on hold for now  Controlled with hydralazine 10 every 4 as needed  Leukocyytosis is probably reactive but not workup further at this stage unless symptomatic  Body mass index is 41.77 kg/m.-op counselling-?bariatric referral   DVT prophylaxis: lovenoex  Code Status:   full   Family Communication:   none Disposition Plan: ip   Consultants:   GI  Procedures:   none  Antimicrobials:   none   Subjective:  Pleasant mild n this am but no vomit Hasn't been oob yet Family at bedside No cp Pain in abd overall much improved No fever ++flatus no stool yet  Objective: Vitals:   02/13/18 1300 02/13/18 2050 02/13/18 2123 02/14/18 0639  BP: 111/60  (!) 124/57 124/60  Pulse: (!) 57  66 73  Resp:   16 18  Temp: 97.9 F (36.6  C)  98.9 F (37.2 C) 98.9 F (37.2 C)  TempSrc: Oral  Oral Oral  SpO2: 98% 98% 97% 95%  Weight:      Height:        Intake/Output Summary (Last 24 hours) at 02/14/2018 0932 Last data filed at 02/14/2018 9528 Gross per 24 hour  Intake 1232 ml  Output 1100 ml  Net 132 ml   Filed Weights   02/13/18 0333  Weight: 128.3 kg (282 lb 13.6 oz)    Examination:  Obese, no distress s1 s 2no m/r/g cta b abd obese midline scar--do not appreicate bs, distended BS diminished but improved form prior No le edema   Data Reviewed: I have personally reviewed following labs and imaging studies  CBC: Recent Labs  Lab 02/12/18 2142 02/13/18 0450 02/14/18 0545  WBC 14.7* 12.8* 11.2*  HGB 14.9 13.6 13.2  HCT 41.2 40.5 38.2*  MCV 94.1 96.4 96.2  PLT 247 222 413   Basic Metabolic Panel: Recent Labs  Lab 02/12/18 2142 02/13/18 0450 02/14/18 0545  NA 135 136 138  K 4.4 4.9 4.4  CL 101 105 109  CO2 21* 23 21*  GLUCOSE 141* 120* 107*  BUN 23* 24* 16  CREATININE 1.21 1.21 1.06  CALCIUM 9.3 8.5* 8.1*   GFR: Estimated Creatinine Clearance: 90.9 mL/min (by C-G formula based on SCr of 1.06 mg/dL). Liver Function Tests: Recent Labs  Lab 02/12/18 2142  AST 28  ALT 23  ALKPHOS 69  BILITOT 1.5*  PROT 7.8  ALBUMIN 4.2   Recent  Labs  Lab 02/12/18 2142  LIPASE 29   No results for input(s): AMMONIA in the last 168 hours. Coagulation Profile: No results for input(s): INR, PROTIME in the last 168 hours. Cardiac Enzymes: No results for input(s): CKTOTAL, CKMB, CKMBINDEX, TROPONINI in the last 168 hours. CBG: Recent Labs  Lab 02/13/18 0842 02/13/18 1901 02/14/18 0024 02/14/18 0758  GLUCAP 112* 120* 103* 116*   Urine analysis:    Component Value Date/Time   COLORURINE YELLOW 02/12/2018 2138   APPEARANCEUR CLEAR 02/12/2018 2138   LABSPEC >1.046 (H) 02/12/2018 2138   PHURINE 5.0 02/12/2018 2138   GLUCOSEU NEGATIVE 02/12/2018 2138   HGBUR NEGATIVE 02/12/2018 2138    BILIRUBINUR NEGATIVE 02/12/2018 2138   KETONESUR NEGATIVE 02/12/2018 2138   PROTEINUR NEGATIVE 02/12/2018 2138   UROBILINOGEN 1.0 11/20/2011 2046   NITRITE NEGATIVE 02/12/2018 2138   LEUKOCYTESUR NEGATIVE 02/12/2018 2138     Radiology Studies: Reviewed images personally in health database    Scheduled Meds: . enoxaparin (LOVENOX) injection  40 mg Subcutaneous Q12H  . mometasone-formoterol  2 puff Inhalation BID   Continuous Infusions: . dextrose 5 % and 0.9% NaCl       LOS: 1 day    Time spent: [place], MD [facility] ([facility]   If 7PM-7AM, please contact night-coverage [email or web address] Password [facility] 02/14/2018, 9:32 AM

## 2018-02-15 ENCOUNTER — Inpatient Hospital Stay (HOSPITAL_COMMUNITY): Payer: Medicare Other

## 2018-02-15 LAB — GLUCOSE, CAPILLARY
GLUCOSE-CAPILLARY: 83 mg/dL (ref 65–99)
GLUCOSE-CAPILLARY: 85 mg/dL (ref 65–99)
Glucose-Capillary: 70 mg/dL (ref 65–99)

## 2018-02-15 NOTE — Progress Notes (Signed)
Hospitalist progress note   Shane Clark  YQM:[id number] DOB: Nov 15, 1951 DOA: 02/13/2018 PCP: Gaynelle Arabian, MD   Specialists:  Gen surg  Brief Narrative:  67 year old male-known history of  ?COPD HTN 02/11/17 recent surgery shoulder   admission 3/18-3/21 small bowel obstruction Chronic underlying dyspnea status post workup with cardiology, normal cath x 3  SBO 2012-time did undergo extensive lysis of adhesions complicated by postop ileus    Assessment & Plan:   Assessment:  The primary encounter diagnosis was Generalized abdominal pain. Diagnoses of History of small bowel obstruction and SBO (small bowel obstruction) ([place]) were also pertinent to this visit.  SBO-General surgery consulted regarding patient-improvijg but slowly Still dil bowel loops on rpt AXR 3/18 continue D5, 0.9 saline 100-->50 cc/h for now, pain controlled on hydromorphone 1 mg as needed and control nausea with Zofran No need NGT at this time Tolerating liquids as per Gen surg orders  Prior dyspena-probably restricitve component-no work-up in house  Ectatic abdominal aorta-will need follow-up ultrasound in about 5 years  Hypertension-oral meds on hold for now  Controlled with hydralazine 10 every 4 as needed  Leukocyytosis is probably reactive but not workup further at this stage unless symptomatic  Body mass index is 41.77 kg/m.-op counselling-?bariatric referral   DVT prophylaxis: lovenoex  Code Status:   full   Family Communication:   none Disposition Plan: ip until resolving SBO--might require surgery if no better   Consultants:   GI  Procedures:   none  Antimicrobials:   none   Subjective:  ++flatus, no cp No feevr looks much improved No n/v or "dry heave"  Objective: Vitals:   02/14/18 2106 02/14/18 2112 02/15/18 0400 02/15/18 0852  BP:  (!) 127/52 (!) 108/52   Pulse:  63 71   Resp:  16 17   Temp:  98.2 F (36.8 C) 98.9 F (37.2 C)   TempSrc:  Oral Oral   SpO2: 98% 97%  93% 95%  Weight:      Height:        Intake/Output Summary (Last 24 hours) at 02/15/2018 1056 Last data filed at 02/15/2018 0900 Gross per 24 hour  Intake 2636.66 ml  Output 1350 ml  Net 1286.66 ml   Filed Weights   02/13/18 0333  Weight: 128.3 kg (282 lb 13.6 oz)    Examination:  Obese, no distress s1 s 2no m/r/g cta b abd obese midline scar--distended BS diminished but improved form prior No le edema   Data Reviewed: I have personally reviewed following labs and imaging studies  CBC: Recent Labs  Lab 02/12/18 2142 02/13/18 0450 02/14/18 0545  WBC 14.7* 12.8* 11.2*  HGB 14.9 13.6 13.2  HCT 41.2 40.5 38.2*  MCV 94.1 96.4 96.2  PLT 247 222 889   Basic Metabolic Panel: Recent Labs  Lab 02/12/18 2142 02/13/18 0450 02/14/18 0545  NA 135 136 138  K 4.4 4.9 4.4  CL 101 105 109  CO2 21* 23 21*  GLUCOSE 141* 120* 107*  BUN 23* 24* 16  CREATININE 1.21 1.21 1.06  CALCIUM 9.3 8.5* 8.1*   GFR: Estimated Creatinine Clearance: 90.9 mL/min (by C-G formula based on SCr of 1.06 mg/dL). Liver Function Tests: Recent Labs  Lab 02/12/18 2142  AST 28  ALT 23  ALKPHOS 69  BILITOT 1.5*  PROT 7.8  ALBUMIN 4.2   Recent Labs  Lab 02/12/18 2142  LIPASE 29   No results for input(s): AMMONIA in the last 168 hours. Coagulation Profile: No results  for input(s): INR, PROTIME in the last 168 hours. Cardiac Enzymes: No results for input(s): CKTOTAL, CKMB, CKMBINDEX, TROPONINI in the last 168 hours. CBG: Recent Labs  Lab 02/14/18 0024 02/14/18 0758 02/14/18 1641 02/15/18 0026 02/15/18 0809  GLUCAP 103* 116* 103* 83 85   Urine analysis:    Component Value Date/Time   COLORURINE YELLOW 02/12/2018 2138   APPEARANCEUR CLEAR 02/12/2018 2138   LABSPEC >1.046 (H) 02/12/2018 2138   PHURINE 5.0 02/12/2018 2138   GLUCOSEU NEGATIVE 02/12/2018 2138   HGBUR NEGATIVE 02/12/2018 2138   BILIRUBINUR NEGATIVE 02/12/2018 2138   KETONESUR NEGATIVE 02/12/2018 2138   PROTEINUR  NEGATIVE 02/12/2018 2138   UROBILINOGEN 1.0 11/20/2011 2046   NITRITE NEGATIVE 02/12/2018 2138   LEUKOCYTESUR NEGATIVE 02/12/2018 2138     Radiology Studies: Reviewed images personally in health database    Scheduled Meds: . enoxaparin (LOVENOX) injection  40 mg Subcutaneous Q12H  . irbesartan  150 mg Oral Daily  . mometasone-formoterol  2 puff Inhalation BID  . pantoprazole  40 mg Oral Daily  . tamsulosin  0.4 mg Oral Daily   Continuous Infusions:    LOS: 2 days    Time spent: [place], MD [facility] ([facility]   If 7PM-7AM, please contact night-coverage [email or web address] Password [facility] 02/15/2018, 10:56 AM

## 2018-02-15 NOTE — Progress Notes (Signed)
[facility]/Trauma Progress Note      Assessment/Plan Recurrent SBO - seems to be resolving as he is passing flatus - xrays 03/17 showed air throughout entire intestinal tract but still a few dilated loops - mobilize - will follow  - abd xray pending  HTN - per medicine  FEN -fulls as tolerated VTE -SCDs, ok for DVT prophylaxis from our standpoint ID -none    LOS: 2 days    Subjective: CC: SBO  No abdominal pain, nausea or vomiting. Having flatus. No BM. Does feel slightly bloated.   Objective: Vital signs in last 24 hours: Temp:  [97.9 F (36.6 C)-98.9 F (37.2 C)] 98.9 F (37.2 C) (03/18 0400) Pulse Rate:  [63-71] 71 (03/18 0400) Resp:  [16-17] 17 (03/18 0400) BP: (108-137)/(52-70) 108/52 (03/18 0400) SpO2:  [93 %-98 %] 93 % (03/18 0400) Last BM Date: 02/12/18  Intake/Output from previous day: 03/17 0701 - 03/18 0700 In: 2256.7 [P.O.:460; I.V.:1796.7] Out: 950 [Urine:950] Intake/Output this shift: No intake/output data recorded.  PE: Gen:  Alert, NAD, pleasant, cooperative Pulm:  Rate and effort normal Abd: Soft, obese, mild distention, +BS, no TTP Skin: no rashes noted, warm and dry   Anti-infectives: Anti-infectives (From admission, onward)   None      Lab Results:  Recent Labs    02/13/18 0450 02/14/18 0545  WBC 12.8* 11.2*  HGB 13.6 13.2  HCT 40.5 38.2*  PLT 222 222   BMET Recent Labs    02/13/18 0450 02/14/18 0545  NA 136 138  K 4.9 4.4  CL 105 109  CO2 23 21*  GLUCOSE 120* 107*  BUN 24* 16  CREATININE 1.21 1.06  CALCIUM 8.5* 8.1*   PT/INR No results for input(s): LABPROT, INR in the last 72 hours. CMP     Component Value Date/Time   NA 138 02/14/2018 0545   K 4.4 02/14/2018 0545   CL 109 02/14/2018 0545   CO2 21 (L) 02/14/2018 0545   GLUCOSE 107 (H) 02/14/2018 0545   BUN 16 02/14/2018 0545   CREATININE 1.06 02/14/2018 0545   CALCIUM 8.1 (L) 02/14/2018 0545   PROT 7.8 02/12/2018 2142   ALBUMIN  4.2 02/12/2018 2142   AST 28 02/12/2018 2142   ALT 23 02/12/2018 2142   ALKPHOS 69 02/12/2018 2142   BILITOT 1.5 (H) 02/12/2018 2142   GFRNONAA >60 02/14/2018 0545   GFRAA >60 02/14/2018 0545   Lipase     Component Value Date/Time   LIPASE 29 02/12/2018 2142    Studies/Results: Dg Abd 2 Views  Result Date: 02/14/2018 CLINICAL DATA:  Follow-up small bowel obstruction. EXAM: ABDOMEN - 2 VIEW COMPARISON:  CT scan February 13, 2018 FINDINGS: The stomach is mildly distended with an air-fluid level. A prominent loops of small bowel are again seen, particularly in the left abdomen. The loops of small bowel in the right abdomen are normal in caliber as is the colonic bowel gas pattern. No free air, portal venous gas, or pneumatosis on limited imaging. No other acute abnormalities. IMPRESSION: Persistent small bowel obstruction. Electronically Signed   By: Dorise Bullion III M.D   On: 02/14/2018 10:04      Kalman Drape , [facility] 02/15/2018, 8:13 AM  Pager: [phone] Mon-Wed, Friday 7:00am-4:30pm Thurs 7am-11:30am  Consults: [phone]

## 2018-02-16 ENCOUNTER — Inpatient Hospital Stay (HOSPITAL_COMMUNITY): Payer: Medicare Other

## 2018-02-16 LAB — RENAL FUNCTION PANEL
ANION GAP: 9 (ref 5–15)
Albumin: 3.3 g/dL — ABNORMAL LOW (ref 3.5–5.0)
BUN: 13 mg/dL (ref 6–20)
CALCIUM: 8.2 mg/dL — AB (ref 8.9–10.3)
CO2: 22 mmol/L (ref 22–32)
Chloride: 107 mmol/L (ref 101–111)
Creatinine, Ser: 1.05 mg/dL (ref 0.61–1.24)
GFR calc Af Amer: >60 mL/min (ref >60)
GFR calc non Af Amer: >60 mL/min (ref >60)
GLUCOSE: 86 mg/dL (ref 65–99)
Phosphorus: 2.5 mg/dL (ref 2.5–4.6)
Potassium: 4.5 mmol/L (ref 3.5–5.1)
SODIUM: 138 mmol/L (ref 135–145)

## 2018-02-16 LAB — CBC WITH DIFFERENTIAL/PLATELET
BASOS PCT: 0 %
Basophils Absolute: 0 10*3/uL (ref 0.0–0.1)
Eosinophils Absolute: 0.2 10*3/uL (ref 0.0–0.7)
Eosinophils Relative: 2 %
HEMATOCRIT: 36.9 % — AB (ref 39.0–52.0)
Hemoglobin: 12.5 g/dL — ABNORMAL LOW (ref 13.0–17.0)
Lymphocytes Relative: 23 %
Lymphs Abs: 1.7 10*3/uL (ref 0.7–4.0)
MCH: 32.6 pg (ref 26.0–34.0)
MCHC: 33.9 g/dL (ref 30.0–36.0)
MCV: 96.1 fL (ref 78.0–100.0)
MONO ABS: 0.6 10*3/uL (ref 0.1–1.0)
MONOS PCT: 8 %
NEUTROS ABS: 5 10*3/uL (ref 1.7–7.7)
Neutrophils Relative %: 67 %
Platelets: 165 10*3/uL (ref 150–400)
RBC: 3.84 MIL/uL — ABNORMAL LOW (ref 4.22–5.81)
RDW: 13.4 % (ref 11.5–15.5)
WBC: 7.4 10*3/uL (ref 4.0–10.5)

## 2018-02-16 LAB — GLUCOSE, CAPILLARY
GLUCOSE-CAPILLARY: 74 mg/dL (ref 65–99)
Glucose-Capillary: 81 mg/dL (ref 65–99)
Glucose-Capillary: 91 mg/dL (ref 65–99)

## 2018-02-16 NOTE — Progress Notes (Signed)
Hospitalist progress note   HOLDAN STUCKE  GMW:[id number] DOB: [birth year] DOA: 02/13/2018 PCP: Gaynelle Arabian, MD   Specialists:  Gen surg  Brief Narrative:  67 year old male-known history of  ?COPD HTN 02/11/17 recent surgery shoulder   admission 3/18-3/21 small bowel obstruction Chronic underlying dyspnea status post workup with cardiology, normal cath x 3  SBO 2012-time did undergo extensive lysis of adhesions complicated by postop ileus    Assessment & Plan:   Assessment:  The primary encounter diagnosis was Generalized abdominal pain. Diagnoses of History of small bowel obstruction and SBO (small bowel obstruction) ([place]) were also pertinent to this visit.  SBO-General surgery consulted regarding patient-improvijg but slowly AXR 3/19 shows clearing--d/w surgery hold on advancing to soft diet till stool passed continue D5, 0.9 saline 100-->50 cc/h for now, d/c IVF If able can d/c pain control in am hydromorphone 1 mg   Prior dyspena-probably restricitve component-no work-up in house  Ectatic abdominal aorta-will need follow-up ultrasound in about 5 years  Hypertension-Controlled with hydralazine 10 every 4 as needed Resumed oral meds   Leukocyytosis is probably reactive but not workup further at this stage unless symptomatic  Body mass index is 41.77 kg/m.-op counselling-?bariatric referral   DVT prophylaxis: lovenoex  Code Status:   full   Family Communication:   none Disposition Plan: ip until resolving SBO--might require surgery if no better   Consultants:   GI  Procedures:   none  Antimicrobials:   none   Subjective:  +flatus no stool tol full liquids well No pain No fever  No chills   Objective: Vitals:   02/15/18 1413 02/15/18 2031 02/16/18 0609 02/16/18 0918  BP: 122/68 129/70 121/62   Pulse: 69 63 68 65  Resp: 16 16 16 16   Temp: 97.6 F (36.4 C) 98.8 F (37.1 C) 98.9 F (37.2 C)   TempSrc: Oral Oral Oral   SpO2: 96% 98% 97% 95%   Weight:      Height:        Intake/Output Summary (Last 24 hours) at 02/16/2018 1048 Last data filed at 02/16/2018 0900 Gross per 24 hour  Intake 570 ml  Output 1475 ml  Net -905 ml   Filed Weights   02/13/18 0333  Weight: 128.3 kg (282 lb 13.6 oz)    Examination:  Obese, no distress s1 s 2no m/r/g cta b abd obese midline scar--BS have improved.  Less distended No le edema   Data Reviewed: I have personally reviewed following labs and imaging studies  CBC: Recent Labs  Lab 02/12/18 2142 02/13/18 0450 02/14/18 0545 02/16/18 0735  WBC 14.7* 12.8* 11.2* 7.4  NEUTROABS  --   --   --  5.0  HGB 14.9 13.6 13.2 12.5*  HCT 41.2 40.5 38.2* 36.9*  MCV 94.1 96.4 96.2 96.1  PLT 247 222 222 440   Basic Metabolic Panel: Recent Labs  Lab 02/12/18 2142 02/13/18 0450 02/14/18 0545 02/16/18 0735  NA 135 136 138 138  K 4.4 4.9 4.4 4.5  CL 101 105 109 107  CO2 21* 23 21* 22  GLUCOSE 141* 120* 107* 86  BUN 23* 24* 16 13  CREATININE 1.21 1.21 1.06 1.05  CALCIUM 9.3 8.5* 8.1* 8.2*  PHOS  --   --   --  2.5   GFR: Estimated Creatinine Clearance: 91.7 mL/min (by C-G formula based on SCr of 1.05 mg/dL). Liver Function Tests: Recent Labs  Lab 02/12/18 2142 02/16/18 0735  AST 28  --   ALT 23  --  ALKPHOS 69  --   BILITOT 1.5*  --   PROT 7.8  --   ALBUMIN 4.2 3.3*   Recent Labs  Lab 02/12/18 2142  LIPASE 29   No results for input(s): AMMONIA in the last 168 hours. Coagulation Profile: No results for input(s): INR, PROTIME in the last 168 hours. Cardiac Enzymes: No results for input(s): CKTOTAL, CKMB, CKMBINDEX, TROPONINI in the last 168 hours. CBG: Recent Labs  Lab 02/15/18 0026 02/15/18 0809 02/15/18 1639 02/16/18 0007 02/16/18 0755  GLUCAP 83 85 70 81 74   Urine analysis:    Component Value Date/Time   COLORURINE YELLOW 02/12/2018 2138   APPEARANCEUR CLEAR 02/12/2018 2138   LABSPEC >1.046 (H) 02/12/2018 2138   PHURINE 5.0 02/12/2018 2138    GLUCOSEU NEGATIVE 02/12/2018 2138   HGBUR NEGATIVE 02/12/2018 2138   BILIRUBINUR NEGATIVE 02/12/2018 2138   KETONESUR NEGATIVE 02/12/2018 2138   PROTEINUR NEGATIVE 02/12/2018 2138   UROBILINOGEN 1.0 11/20/2011 2046   NITRITE NEGATIVE 02/12/2018 2138   LEUKOCYTESUR NEGATIVE 02/12/2018 2138     Radiology Studies: Reviewed images personally in health database    Scheduled Meds: . enoxaparin (LOVENOX) injection  40 mg Subcutaneous Q12H  . irbesartan  150 mg Oral Daily  . mometasone-formoterol  2 puff Inhalation BID  . pantoprazole  40 mg Oral Daily  . tamsulosin  0.4 mg Oral Daily   Continuous Infusions:    LOS: 3 days    Time spent: [place], MD [facility] ([facility]   If 7PM-7AM, please contact night-coverage [email or web address] Password [facility] 02/16/2018, 10:48 AM

## 2018-02-16 NOTE — Progress Notes (Signed)
[facility]/Trauma Progress Note      Assessment/Plan  Recurrent SBO - seems to be resolving as he is passing flatus, no BM yet - xrays 03/18 showed similar appearance of diffuse gaseous small bowel distention - mobilize - will follow  - abd xray pending this am  HTN - per medicine  FEN -fulls as tolerated VTE -SCDs, ok for DVT prophylaxis from our standpoint ID -none  Plan: continue fulls, pending abd xray.    LOS: 3 days    Subjective: CC: SBO  No abdominal pain, nausea or vomiting. Having flatus. No BM. Tolerating full liquid diet   Objective: Vital signs in last 24 hours: Temp:  [97.6 F (36.4 C)-98.9 F (37.2 C)] 98.9 F (37.2 C) (03/19 0609) Pulse Rate:  [63-69] 68 (03/19 0609) Resp:  [16] 16 (03/19 0609) BP: (121-129)/(62-70) 121/62 (03/19 0609) SpO2:  [95 %-98 %] 97 % (03/19 0609) Last BM Date: 02/12/18  Intake/Output from previous day: 03/18 0701 - 03/19 0700 In: 540 [P.O.:540] Out: 1875 [Urine:1875] Intake/Output this shift: No intake/output data recorded.  PE: Gen:  Alert, NAD, pleasant, cooperative Pulm:  Rate and effort normal Abd: Soft, obese, mild distention, hypoactive BS, no TTP Skin: no rashes noted, warm and dry   Anti-infectives: Anti-infectives (From admission, onward)   None      Lab Results:  Recent Labs    02/14/18 0545 02/16/18 0735  WBC 11.2* 7.4  HGB 13.2 12.5*  HCT 38.2* 36.9*  PLT 222 165   BMET Recent Labs    02/14/18 0545  NA 138  K 4.4  CL 109  CO2 21*  GLUCOSE 107*  BUN 16  CREATININE 1.06  CALCIUM 8.1*   PT/INR No results for input(s): LABPROT, INR in the last 72 hours. CMP     Component Value Date/Time   NA 138 02/14/2018 0545   K 4.4 02/14/2018 0545   CL 109 02/14/2018 0545   CO2 21 (L) 02/14/2018 0545   GLUCOSE 107 (H) 02/14/2018 0545   BUN 16 02/14/2018 0545   CREATININE 1.06 02/14/2018 0545   CALCIUM 8.1 (L) 02/14/2018 0545   PROT 7.8 02/12/2018 2142   ALBUMIN  4.2 02/12/2018 2142   AST 28 02/12/2018 2142   ALT 23 02/12/2018 2142   ALKPHOS 69 02/12/2018 2142   BILITOT 1.5 (H) 02/12/2018 2142   GFRNONAA >60 02/14/2018 0545   GFRAA >60 02/14/2018 0545   Lipase     Component Value Date/Time   LIPASE 29 02/12/2018 2142    Studies/Results: Dg Abd 2 Views  Result Date: 02/15/2018 CLINICAL DATA:  Small bowel obstruction. EXAM: ABDOMEN - 2 VIEW COMPARISON:  02/14/2018 FINDINGS: Upright film shows no intraperitoneal free air. Supine views the abdomen again show diffuse gaseous bowel distention with some dilated small bowel loops in the left abdomen measuring up to approximately 6 cm in diameter, similar to prior study. Small volume gas noted in the distal colon. IMPRESSION: Similar appearance of diffuse gaseous small bowel distention with some dilated small bowel in the left abdomen measuring up to 6 cm diameter. Electronically Signed   By: Misty Stanley M.D.   On: 02/15/2018 08:54      Kalman Drape , [facility] 02/16/2018, 8:12 AM  Pager: [phone] Mon-Wed, Friday 7:00am-4:30pm Thurs 7am-11:30am  Consults: [phone]

## 2018-02-17 DIAGNOSIS — K56609 Unspecified intestinal obstruction, unspecified as to partial versus complete obstruction: Secondary | ICD-10-CM

## 2018-02-17 LAB — GLUCOSE, CAPILLARY
GLUCOSE-CAPILLARY: 68 mg/dL (ref 65–99)
GLUCOSE-CAPILLARY: 83 mg/dL (ref 65–99)

## 2018-02-17 NOTE — Discharge Summary (Signed)
Physician Discharge Summary  Shane Clark NFA:[id number] DOB: [birth year] DOA: 02/13/2018  PCP: Gaynelle Arabian, MD  Admit date: 02/13/2018 Discharge date: 02/17/2018  Time spent: 35 minutes  Recommendations for Outpatient Follow-up:  1. PCP in 1 week, encourage almost daily laxative use to avoid constipation   Discharge Diagnoses:  Principal Problem:   Small bowel obstruction ([place]) Active Problems:   COPD (chronic obstructive pulmonary disease) ([facility])   Essential hypertension   SBO (small bowel obstruction) ([place])   Discharge Condition: stable  Diet recommendation: soft diet advance as tolerated  Filed Weights   02/13/18 0333  Weight: 128.3 kg (282 lb 13.6 oz)    History of present illness:  14 year oldmale with history of Prior bowel obstructions, hypertension presented to the emergency room with nausea vomiting and abdominal pain  Hospital Course:   Small bowel obstruction -Due to adhesions/scar tissue from multiple prior surgeries -Treated with bowel rest, NG decompression and supportive care -General surgery consulted -Clinically improved NG tube clamped and then removed, diet advanced -Currently tolerating soft diet at the time of discharge -Recommend daily or at least every other day laxative use due to chronic narcotic use to avoid constipation  Rest of his chronic medical problems remain stable -regular home medications resumed at discharge  Consultations: General surgery Discharge Exam: Vitals:   02/16/18 2143 02/17/18 0554  BP: 125/72 121/65  Pulse: (!) 57 67  Resp: 18 18  Temp: 97.7 F (36.5 C) 97.6 F (36.4 C)  SpO2: 98% 95%    General: AAO 3 Cardiovascular: S1-S2/regular rate rhythm Respiratory: clear bilaterally  Discharge Instructions   Discharge Instructions    Diet - low sodium heart healthy   Complete by:  As directed    Increase activity slowly   Complete by:  As directed      Allergies as of 02/17/2018      Reactions    Oxycodone-acetaminophen Anaphylaxis   Advair Diskus [fluticasone-salmeterol]    unknown   Pentazocine Lactate Nausea Only, Other (See Comments)   Stopped breathing "talwin"      Medication List    TAKE these medications   albuterol 108 (90 Base) MCG/ACT inhaler Commonly known as:  PROVENTIL HFA;VENTOLIN HFA Inhale 2 puffs into the lungs every 6 (six) hours as needed. Wheezing/shortness of breath   aspirin EC 81 MG tablet Take 81 mg by mouth daily.   budesonide-formoterol 80-4.5 MCG/ACT inhaler Commonly known as:  SYMBICORT Inhale 2 puffs into the lungs 2 (two) times daily.   HYDROcodone-acetaminophen 7.5-325 MG tablet Commonly known as:  NORCO Take 1-2 tablets by mouth every 6 (six) hours as needed for moderate pain.   loratadine 10 MG tablet Commonly known as:  CLARITIN Take 10 mg by mouth daily as needed for allergies.   methocarbamol 750 MG tablet Commonly known as:  ROBAXIN Take 1 tablet (750 mg total) by mouth 2 (two) times daily as needed for muscle spasms.   nitroGLYCERIN 0.4 MG SL tablet Commonly known as:  NITROSTAT Place 0.4 mg under the tongue every 5 (five) minutes as needed for chest pain.   ondansetron 4 MG tablet Commonly known as:  ZOFRAN Take 1-2 tablets (4-8 mg total) by mouth every 8 (eight) hours as needed for nausea or vomiting.   pantoprazole 40 MG tablet Commonly known as:  PROTONIX Take 40 mg by mouth daily.   promethazine 25 MG tablet Commonly known as:  PHENERGAN Take 1 tablet (25 mg total) by mouth every 6 (six) hours as needed for nausea.  promethazine 25 MG suppository Commonly known as:  PHENERGAN Place 1 suppository (25 mg total) rectally every 6 (six) hours as needed for nausea or vomiting.   senna-docusate 8.6-50 MG tablet Commonly known as:  Senokot-S Take 1 tablet by mouth at bedtime as needed for mild constipation.   tamsulosin 0.4 MG Caps capsule Commonly known as:  FLOMAX Take 0.4 mg by mouth daily.   telmisartan 40  MG tablet Commonly known as:  MICARDIS Take 40 mg by mouth daily.      Allergies  Allergen Reactions  . Oxycodone-Acetaminophen Anaphylaxis  . Advair Diskus [Fluticasone-Salmeterol]     unknown  . Pentazocine Lactate Nausea Only and Other (See Comments)    Stopped breathing "talwin"   Follow-up Information    Gaynelle Arabian, MD. Schedule an appointment as soon as possible for a visit in 1 week(s).   Specialty:  Family Medicine Contact information: [address] [place] [address] [phone]            The results of significant diagnostics from this hospitalization (including imaging, microbiology, ancillary and laboratory) are listed below for reference.    Significant Diagnostic Studies: Ct Abdomen Pelvis W Contrast  Result Date: 02/13/2018 CLINICAL DATA:  Acute onset of generalized abdominal pain, nausea and vomiting. EXAM: CT ABDOMEN AND PELVIS WITH CONTRAST TECHNIQUE: Multidetector CT imaging of the abdomen and pelvis was performed using the standard protocol following bolus administration of intravenous contrast. CONTRAST:  126mL ISOVUE-300 IOPAMIDOL (ISOVUE-300) INJECTION 61% COMPARISON:  CT of the abdomen and pelvis from 02/15/2017 FINDINGS: Lower chest: Mild bibasilar opacities likely reflect atelectasis. The visualized portions of the mediastinum are unremarkable. Hepatobiliary: The liver is unremarkable in appearance. The gallbladder is unremarkable in appearance. The common bile duct remains normal in caliber. Pancreas: The pancreas is within normal limits. Spleen: The spleen is unremarkable in appearance. Adrenals/Urinary Tract: The adrenal glands are unremarkable in appearance. Mild nonspecific perinephric stranding is noted bilaterally. There is no evidence of hydronephrosis. No renal or ureteral stones are identified. A small right renal cyst is seen. Stomach/Bowel: The stomach is unremarkable in appearance. There is diffuse distention of  small-bowel loops to 5.3 cm in maximal diameter, with gradual decompression. No focal transition point is seen. This is concerning for small bowel dysmotility, possibly reflecting an underlying infectious or inflammatory process. The patient is status post appendectomy. The colon is unremarkable in appearance. Vascular/Lymphatic: Scattered calcification is seen along the abdominal aorta and its branches. There is slight ectasia of the distal abdominal aorta to 2.6 cm in AP dimension. The abdominal aorta is otherwise grossly unremarkable. The inferior vena cava is grossly unremarkable. No retroperitoneal lymphadenopathy is seen. No pelvic sidewall lymphadenopathy is identified. Reproductive: The bladder is mildly distended and grossly unremarkable. The prostate is enlarged, measuring 5.9 cm in AP dimension. Other: No additional soft tissue abnormalities are seen. Musculoskeletal: No acute osseous abnormalities are identified. The visualized musculature is unremarkable in appearance. IMPRESSION: 1. Diffuse distention of small-bowel loops to 5.3 cm in maximal diameter, with gradual decompression. No focal transition point seen. This is concerning for small bowel dysmotility, possibly reflecting an underlying infectious or inflammatory process. 2. Small right renal cyst. 3. Ectatic abdominal aorta at risk for aneurysm development. Recommend followup by ultrasound in 5 years. This recommendation follows ACR consensus guidelines: White Paper of the ACR Incidental Findings Committee II on Vascular Findings. J Am Coll Radiol 2013; 10:789-794. 4. Enlarged prostate noted. 5. Mild bibasilar airspace opacities likely reflect atelectasis. Aortic  Atherosclerosis (ICD10-I70.0). Electronically Signed   By: Garald Balding M.D.   On: 02/13/2018 01:39   Dg Abd 2 Views  Result Date: 02/16/2018 CLINICAL DATA:  Follow-up small-bowel obstruction. EXAM: ABDOMEN - 2 VIEW COMPARISON:  02/15/2018. FINDINGS: Previously identified  small-bowel distention has almost completely resolved. Stool in the colon. No free air. Degenerative changes lumbar spine. IMPRESSION: Previously noted small-bowel distention has almost completely resolved. Stool noted colon. No free air. Electronically Signed   By: Marcello Moores  Register   On: 02/16/2018 10:10   Dg Abd 2 Views  Result Date: 02/15/2018 CLINICAL DATA:  Small bowel obstruction. EXAM: ABDOMEN - 2 VIEW COMPARISON:  02/14/2018 FINDINGS: Upright film shows no intraperitoneal free air. Supine views the abdomen again show diffuse gaseous bowel distention with some dilated small bowel loops in the left abdomen measuring up to approximately 6 cm in diameter, similar to prior study. Small volume gas noted in the distal colon. IMPRESSION: Similar appearance of diffuse gaseous small bowel distention with some dilated small bowel in the left abdomen measuring up to 6 cm diameter. Electronically Signed   By: Misty Stanley M.D.   On: 02/15/2018 08:54   Dg Abd 2 Views  Result Date: 02/14/2018 CLINICAL DATA:  Follow-up small bowel obstruction. EXAM: ABDOMEN - 2 VIEW COMPARISON:  CT scan February 13, 2018 FINDINGS: The stomach is mildly distended with an air-fluid level. A prominent loops of small bowel are again seen, particularly in the left abdomen. The loops of small bowel in the right abdomen are normal in caliber as is the colonic bowel gas pattern. No free air, portal venous gas, or pneumatosis on limited imaging. No other acute abnormalities. IMPRESSION: Persistent small bowel obstruction. Electronically Signed   By: Dorise Bullion III M.D   On: 02/14/2018 10:04    Microbiology: No results found for this or any previous visit (from the past 240 hour(s)).   Labs: Basic Metabolic Panel: Recent Labs  Lab 02/12/18 2142 02/13/18 0450 02/14/18 0545 02/16/18 0735  NA 135 136 138 138  K 4.4 4.9 4.4 4.5  CL 101 105 109 107  CO2 21* 23 21* 22  GLUCOSE 141* 120* 107* 86  BUN 23* 24* 16 13  CREATININE  1.21 1.21 1.06 1.05  CALCIUM 9.3 8.5* 8.1* 8.2*  PHOS  --   --   --  2.5   Liver Function Tests: Recent Labs  Lab 02/12/18 2142 02/16/18 0735  AST 28  --   ALT 23  --   ALKPHOS 69  --   BILITOT 1.5*  --   PROT 7.8  --   ALBUMIN 4.2 3.3*   Recent Labs  Lab 02/12/18 2142  LIPASE 29   No results for input(s): AMMONIA in the last 168 hours. CBC: Recent Labs  Lab 02/12/18 2142 02/13/18 0450 02/14/18 0545 02/16/18 0735  WBC 14.7* 12.8* 11.2* 7.4  NEUTROABS  --   --   --  5.0  HGB 14.9 13.6 13.2 12.5*  HCT 41.2 40.5 38.2* 36.9*  MCV 94.1 96.4 96.2 96.1  PLT 247 222 222 165   Cardiac Enzymes: No results for input(s): CKTOTAL, CKMB, CKMBINDEX, TROPONINI in the last 168 hours. BNP: BNP (last 3 results) No results for input(s): BNP in the last 8760 hours.  ProBNP (last 3 results) No results for input(s): PROBNP in the last 8760 hours.  CBG: Recent Labs  Lab 02/16/18 0007 02/16/18 0755 02/16/18 1611 02/16/18 2359 02/17/18 0732  GLUCAP 81 74 91 68 83  Signed:  Domenic Polite MD.  [facility] 02/17/2018, 2:03 PM

## 2018-02-17 NOTE — Care Management Important Message (Signed)
Important Message  Patient Details  Name: Shane Clark MRN: [id number] Date of Birth: January 15, 1951   Medicare Important Message Given:  Yes    Orbie Pyo 02/17/2018, 12:56 PM

## 2018-02-17 NOTE — Final Consult Note (Addendum)
Consultant Final Sign-Off Note    Assessment/Final recommendations  Shane Clark is a 67 y.o. male followed by me for SBO   Wound care (if applicable):    Diet at discharge: soft diet, small frequent meals, avoid large meals   Activity at discharge: per primary team   Follow-up appointment:  None needed   Pending results:  Unresulted Labs (From admission, onward)   None       Medication recommendations:   Other recommendations:    If pt tolerates a soft diet then he is okay for discharge from a surgical standpoint.   Thank you for allowing [place] to participate in the care of your patient!  Please consult [place] again if you have further needs for your patient.  Kalman Drape 02/17/2018 7:56 AM    Subjective   CC: SBO  Pt had a BM. Still having flatus. Tolerating a FLD. No abdominal pain. No new complaints.   Objective  Vital signs in last 24 hours: Temp:  [97.6 F (36.4 C)-97.7 F (36.5 C)] 97.6 F (36.4 C) (03/20 0554) Pulse Rate:  [57-77] 67 (03/20 0554) Resp:  [16-18] 18 (03/20 0554) BP: (115-125)/(62-72) 121/65 (03/20 0554) SpO2:  [95 %-98 %] 95 % (03/20 0554)  PE: Gen: Alert, NAD, pleasant, cooperative Pulm:Rate andeffort normal Abd: Soft,obese, no distention,+ BS, no TTP Skin: no rashes noted, warm and dry   Pertinent labs and Studies: Recent Labs    02/16/18 0735  WBC 7.4  HGB 12.5*  HCT 36.9*   BMET Recent Labs    02/16/18 0735  NA 138  K 4.5  CL 107  CO2 22  GLUCOSE 86  BUN 13  CREATININE 1.05  CALCIUM 8.2*   No results for input(s): LABURIN in the last 72 hours. Results for orders placed or performed during the hospital encounter of 02/05/16  Culture, blood (Routine X 2) w Reflex to ID Panel     Status: None   Collection Time: 02/05/16 11:15 AM  Result Value Ref Range Status   Specimen Description BLOOD LEFT HAND  Final   Special Requests BOTTLES DRAWN AEROBIC AND ANAEROBIC 4CC  Final   Culture  Setup Time   Final   GRAM POSITIVE COCCI IN CLUSTERS IN BOTH AEROBIC AND ANAEROBIC BOTTLES CRITICAL RESULT CALLED TO, READ BACK BY AND VERIFIED WITH: K DUFFY,RN @0727  02/06/16 MKELLY    Culture   Final    STAPHYLOCOCCUS SPECIES (COAGULASE NEGATIVE) THE SIGNIFICANCE OF ISOLATING THIS ORGANISM FROM A SINGLE SET OF BLOOD CULTURES WHEN MULTIPLE SETS ARE DRAWN IS UNCERTAIN. PLEASE NOTIFY THE MICROBIOLOGY DEPARTMENT WITHIN ONE WEEK IF SPECIATION AND SENSITIVITIES ARE REQUIRED.    Report Status 02/08/2016 FINAL  Final  Culture, blood (Routine X 2) w Reflex to ID Panel     Status: None   Collection Time: 02/05/16 11:36 AM  Result Value Ref Range Status   Specimen Description BLOOD RIGHT ANTECUBITAL  Final   Special Requests BOTTLES DRAWN AEROBIC AND ANAEROBIC 10CC  Final   Culture NO GROWTH 5 DAYS  Final   Report Status 02/10/2016 FINAL  Final  Urine culture     Status: None   Collection Time: 02/05/16  1:59 PM  Result Value Ref Range Status   Specimen Description URINE, CLEAN CATCH  Final   Special Requests NONE  Final   Culture NO GROWTH 1 DAY  Final   Report Status 02/06/2016 FINAL  Final  MRSA PCR Screening     Status: None   Collection  Time: 02/05/16  4:09 PM  Result Value Ref Range Status   MRSA by PCR NEGATIVE NEGATIVE Final    Comment:        The GeneXpert MRSA Assay (FDA approved for NASAL specimens only), is one component of a comprehensive MRSA colonization surveillance program. It is not intended to diagnose MRSA infection nor to guide or monitor treatment for MRSA infections.     Imaging: Dg Abd 2 Views  Result Date: 02/16/2018 CLINICAL DATA:  Follow-up small-bowel obstruction. EXAM: ABDOMEN - 2 VIEW COMPARISON:  02/15/2018. FINDINGS: Previously identified small-bowel distention has almost completely resolved. Stool in the colon. No free air. Degenerative changes lumbar spine. IMPRESSION: Previously noted small-bowel distention has almost completely resolved. Stool noted colon. No free  air. Electronically Signed   By: Marcello Moores  Register   On: 02/16/2018 10:10    Jackson Latino, [facility] Surgery Pager [phone]

## 2018-02-17 NOTE — Progress Notes (Signed)
Discharge home. Home discharge instruction given, no question verbalized. 

## 2018-02-26 MED FILL — PROMETHAZINE 25 MG TABLET: 25 | 5 days supply | Qty: 20 | Fill #0

## 2018-03-06 ENCOUNTER — Encounter (HOSPITAL_COMMUNITY): Payer: Self-pay | Admitting: Emergency Medicine

## 2018-03-06 ENCOUNTER — Ambulatory Visit (HOSPITAL_COMMUNITY)
Admission: EM | Admit: 2018-03-06 | Discharge: 2018-03-06 | Disposition: A | Discharge status: Home / Self Care | Payer: Medicare Other | Attending: Internal Medicine | Admitting: Internal Medicine

## 2018-03-06 DIAGNOSIS — J449 Chronic obstructive pulmonary disease, unspecified: Secondary | ICD-10-CM | POA: Insufficient documentation

## 2018-03-06 DIAGNOSIS — M7542 Impingement syndrome of left shoulder: Secondary | ICD-10-CM | POA: Insufficient documentation

## 2018-03-06 DIAGNOSIS — R339 Retention of urine, unspecified: Secondary | ICD-10-CM | POA: Insufficient documentation

## 2018-03-06 DIAGNOSIS — E669 Obesity, unspecified: Secondary | ICD-10-CM | POA: Diagnosis not present

## 2018-03-06 DIAGNOSIS — Z981 Arthrodesis status: Secondary | ICD-10-CM | POA: Diagnosis not present

## 2018-03-06 DIAGNOSIS — G8929 Other chronic pain: Secondary | ICD-10-CM | POA: Diagnosis not present

## 2018-03-06 DIAGNOSIS — K56609 Unspecified intestinal obstruction, unspecified as to partial versus complete obstruction: Secondary | ICD-10-CM | POA: Diagnosis not present

## 2018-03-06 DIAGNOSIS — Z7982 Long term (current) use of aspirin: Secondary | ICD-10-CM | POA: Diagnosis not present

## 2018-03-06 DIAGNOSIS — Z79899 Other long term (current) drug therapy: Secondary | ICD-10-CM | POA: Insufficient documentation

## 2018-03-06 DIAGNOSIS — N400 Enlarged prostate without lower urinary tract symptoms: Secondary | ICD-10-CM | POA: Insufficient documentation

## 2018-03-06 DIAGNOSIS — I959 Hypotension, unspecified: Secondary | ICD-10-CM | POA: Diagnosis not present

## 2018-03-06 DIAGNOSIS — N12 Tubulo-interstitial nephritis, not specified as acute or chronic: Secondary | ICD-10-CM | POA: Insufficient documentation

## 2018-03-06 DIAGNOSIS — M75102 Unspecified rotator cuff tear or rupture of left shoulder, not specified as traumatic: Secondary | ICD-10-CM | POA: Insufficient documentation

## 2018-03-06 DIAGNOSIS — M5489 Other dorsalgia: Secondary | ICD-10-CM

## 2018-03-06 DIAGNOSIS — Z87891 Personal history of nicotine dependence: Secondary | ICD-10-CM | POA: Diagnosis not present

## 2018-03-06 DIAGNOSIS — M549 Dorsalgia, unspecified: Secondary | ICD-10-CM | POA: Diagnosis not present

## 2018-03-06 DIAGNOSIS — Z6830 Body mass index (BMI) 30.0-30.9, adult: Secondary | ICD-10-CM | POA: Diagnosis not present

## 2018-03-06 DIAGNOSIS — K219 Gastro-esophageal reflux disease without esophagitis: Secondary | ICD-10-CM | POA: Diagnosis not present

## 2018-03-06 DIAGNOSIS — I10 Essential (primary) hypertension: Secondary | ICD-10-CM | POA: Insufficient documentation

## 2018-03-06 LAB — POCT URINALYSIS DIP (DEVICE)
GLUCOSE, UA: 100 mg/dL — AB
KETONES UR: 15 mg/dL — AB
NITRITE: POSITIVE — AB
Protein, ur: 100 mg/dL — AB
Specific Gravity, Urine: 1.015 (ref 1.005–1.030)
Urobilinogen, UA: >=8 mg/dL (ref 0.0–1.0)
pH: 7 (ref 5.0–8.0)

## 2018-03-06 MED ORDER — CEFTRIAXONE SODIUM 1 G IJ SOLR
INTRAMUSCULAR | Status: AC
  Filled 2018-03-06: qty 10

## 2018-03-06 MED ORDER — CIPROFLOXACIN HCL 500 MG PO TABS: 500.0000 mg | ORAL_TABLET | Freq: Two times a day (BID) | ORAL | 0 refills | Status: AC

## 2018-03-06 MED ORDER — IBUPROFEN 800 MG PO TABS: 800.0000 mg | ORAL_TABLET | Freq: Once | ORAL | Status: DC

## 2018-03-06 MED ORDER — KETOROLAC TROMETHAMINE 60 MG/2ML IM SOLN
60.0000 mg | Freq: Once | INTRAMUSCULAR | Status: AC
  Administered 2018-03-06: 60 mg via INTRAMUSCULAR

## 2018-03-06 MED ORDER — CEFTRIAXONE SODIUM 1 G IJ SOLR
1.0000 g | Freq: Once | INTRAMUSCULAR | Status: AC
  Administered 2018-03-06: 1 g via INTRAMUSCULAR

## 2018-03-06 MED ORDER — KETOROLAC TROMETHAMINE 60 MG/2ML IM SOLN
INTRAMUSCULAR | Status: AC
  Filled 2018-03-06: qty 2

## 2018-03-06 NOTE — ED Provider Notes (Addendum)
[facility]    CSN: [id number] Arrival date & time: 03/06/18  1225     History   Chief Complaint Chief Complaint  Patient presents with  . Urinary Retention    HPI Shane Clark is a 67 y.o. male history of BPH, COPD, hypertension presenting today with difficulty with urination, dysuria, decreased urine output, back pain.  He started to notice a decrease in urine output and trickling with urination last night, but worsened this morning around 2 AM.  He took Azo earlier this morning to help with the burning.  Since he has not been in significant discomfort and having a lot of back pain.  Denies nausea, vomiting, abdominal pain.  He he states that he has had a kidney infection and UTI previously, but denies history of prostatitis.  Denies history of kidney stones.  Noted the car ride over here worsened his pain.  HPI  Past Medical History:  Diagnosis Date  . Allergy   . Arthritis   . Asthma   . BPH (benign prostatic hyperplasia)   . Chronic back pain    "all over"  . COPD (chronic obstructive pulmonary disease) ([place])   . GERD (gastroesophageal reflux disease)   . Hx of adenomatous colonic polyps 03/22/2016  . Hypertension   . Influenza 11/20/2011  . Sepsis ([place]) 02/05/2016    Patient Active Problem List   Diagnosis Date Noted  . SBO (small bowel obstruction) ([place]) 02/13/2018  . Obesity (BMI 30-39.9) 02/16/2017  . Small bowel obstruction ([place]) 02/15/2017  . SLAP tear of shoulder 02/11/2017  . Impingement syndrome of left shoulder 02/05/2017  . Hx of adenomatous colonic polyps 03/22/2016  . Essential hypertension 02/05/2016  . COPD (chronic obstructive pulmonary disease) ([place]) 11/21/2011  . Diastolic dysfunction 31/51/7616  . BPH (benign prostatic hyperplasia) 11/21/2011  . Hypotension 11/20/2011    Past Surgical History:  Procedure Laterality Date  . ABDOMINAL HERNIA REPAIR    . ANTERIOR CERVICAL DECOMP/DISCECTOMY FUSION     C6-8  . APPENDECTOMY  1960s    . BACK SURGERY    . COLONOSCOPY    . HERNIA REPAIR    . INSERTION DISTRACTION DEVICE LUMBAR POSTERIOR SPINOUS PROCESS    . KNEE ARTHROSCOPY Left   . SHOULDER SURGERY Left 02/11/2017  . SMALL BOWEL REPAIR         Home Medications    Prior to Admission medications   Medication Sig Start Date End Date Taking? Authorizing Provider  albuterol (PROVENTIL HFA;VENTOLIN HFA) 108 (90 BASE) MCG/ACT inhaler Inhale 2 puffs into the lungs every 6 (six) hours as needed. Wheezing/shortness of breath     [provider]  aspirin EC 81 MG tablet Take 81 mg by mouth daily.      [provider]  budesonide-formoterol (SYMBICORT) 80-4.5 MCG/ACT inhaler Inhale 2 puffs into the lungs 2 (two) times daily.    [provider]  ciprofloxacin (CIPRO) 500 MG tablet Take 1 tablet (500 mg total) by mouth every 12 (twelve) hours for 7 days. 03/06/18 03/13/18  Tysean Vandervliet C, PA-C  HYDROcodone-acetaminophen (NORCO) 7.5-325 MG tablet Take 1-2 tablets by mouth every 6 (six) hours as needed for moderate pain. 02/11/17   Leandrew Koyanagi, MD  loratadine (CLARITIN) 10 MG tablet Take 10 mg by mouth daily as needed for allergies.     [provider]  methocarbamol (ROBAXIN) 750 MG tablet Take 1 tablet (750 mg total) by mouth 2 (two) times daily as needed for muscle spasms. 02/11/17  Leandrew Koyanagi, MD  nitroGLYCERIN (NITROSTAT) 0.4 MG SL tablet Place 0.4 mg under the tongue every 5 (five) minutes as needed for chest pain.    [provider]  ondansetron (ZOFRAN) 4 MG tablet Take 1-2 tablets (4-8 mg total) by mouth every 8 (eight) hours as needed for nausea or vomiting. 02/11/17   Leandrew Koyanagi, MD  pantoprazole (PROTONIX) 40 MG tablet Take 40 mg by mouth daily.      [provider]  promethazine (PHENERGAN) 25 MG suppository Place 1 suppository (25 mg total) rectally every 6 (six) hours as needed for nausea or vomiting. 02/13/17   Suzan Slick, NP  promethazine (PHENERGAN) 25 MG  tablet Take 1 tablet (25 mg total) by mouth every 6 (six) hours as needed for nausea. 02/11/17   Leandrew Koyanagi, MD  senna-docusate (SENOKOT-S) 8.6-50 MG tablet Take 1 tablet by mouth at bedtime as needed for mild constipation.    [provider]  Tamsulosin HCl (FLOMAX) 0.4 MG CAPS Take 0.4 mg by mouth daily.      [provider]  telmisartan (MICARDIS) 40 MG tablet Take 40 mg by mouth daily. 11/28/15   [provider]    Family History Family History  Problem Relation Age of Onset  . Coronary artery disease Father   . Pancreatic cancer Brother   . Stroke Brother   . Heart disease Brother   . Diabetes type II Other   . Colon cancer Neg Hx   . Colon polyps Neg Hx   . Rectal cancer Neg Hx   . Stomach cancer Neg Hx     Social History Social History   Tobacco Use  . Smoking status: Former Smoker    Packs/day: 0.12    Years: 20.00    Pack years: 2.40    Types: Cigarettes  . Smokeless tobacco: Former Systems developer  . Tobacco comment: "quit smoking cigarettes in the 1980s"  Substance Use Topics  . Alcohol use: No    Alcohol/week: 0.0 oz  . Drug use: No     Allergies   Oxycodone-acetaminophen; Advair diskus [fluticasone-salmeterol]; and Pentazocine lactate   Review of Systems Review of Systems  Constitutional: Positive for fever.  HENT: Negative for sore throat.   Respiratory: Negative for shortness of breath.   Cardiovascular: Negative for chest pain.  Gastrointestinal: Negative for abdominal pain, nausea and vomiting.  Genitourinary: Positive for decreased urine volume, difficulty urinating and dysuria. Negative for discharge, frequency, penile pain, penile swelling, scrotal swelling and testicular pain.  Musculoskeletal: Positive for back pain and myalgias.  Skin: Negative for rash.  Neurological: Negative for dizziness, light-headedness and headaches.     Physical Exam Triage Vital Signs ED Triage Vitals [03/06/18 1331]  Enc Vitals Group     BP  (!) 104/59     Pulse Rate (!) 109     Resp (!) 24     Temp 99.7 F (37.6 C)     Temp Source Oral     SpO2 96 %     Weight      Height      Head Circumference      Peak Flow      Pain Score      Pain Loc      Pain Edu?      Excl. in [place]?    No data found.  Updated Vital Signs BP (!) 104/59 (BP Location: Right Arm) Comment: Notified Jessica  Pulse (!) 109 Comment: Notified Janett Billow  Temp 99.7 F (37.6 C) (Oral)   Resp (!) 24 Comment: Notified Jessica  SpO2 96%   Visual Acuity Right Eye Distance:   Left Eye Distance:   Bilateral Distance:    Right Eye Near:   Left Eye Near:    Bilateral Near:     Physical Exam  Constitutional: He appears well-developed and well-nourished.  Patient swaying back and forth in wheelchair due to pain  HENT:  Head: Normocephalic and atraumatic.  Mouth/Throat: Oropharynx is clear and moist.  Eyes: Pupils are equal, round, and reactive to light. Conjunctivae and EOM are normal.  Neck: Neck supple.  Cardiovascular: Regular rhythm.  No murmur heard. Tachycardic  Pulmonary/Chest: Effort normal and breath sounds normal. No respiratory distress.  Breathing comfortably at rest, CTA BL  Abdominal: Soft. There is no tenderness.  Negative CVA tenderness, but patient keeps rubbing his lower back  Musculoskeletal: He exhibits no edema.  Neurological: He is alert.  Skin: Skin is warm and dry.  Psychiatric: He has a normal mood and affect.  Nursing note and vitals reviewed.    UC Treatments / Results  Labs (all labs ordered are listed, but only abnormal results are displayed) Labs Reviewed  POCT URINALYSIS DIP (DEVICE) - Abnormal; Notable for the following components:      Result Value   Glucose, UA 100 (*)    Bilirubin Urine SMALL (*)    Ketones, ur 15 (*)    Hgb urine dipstick TRACE (*)    Protein, ur 100 (*)    Nitrite POSITIVE (*)    Leukocytes, UA LARGE (*)    All other components within normal limits  URINE CULTURE     EKG None Radiology No results found.  Procedures Procedures (including critical care time)  Medications Ordered in UC Medications  ketorolac (TORADOL) injection 60 mg (has no administration in time range)     Initial Impression / Assessment and Plan / UC Course  I have reviewed the triage vital signs and the nursing notes.  Pertinent labs & imaging results that were available during my care of the patient were reviewed by me and considered in my medical decision making (see chart for details).     Patient with positive nitrite and large leuks on UA.  Low-grade fever and tachycardia with significant back pain, likely [place].  1 g Rocephin provided in clinic today.  Will treat with Cipro twice daily for 1 week.  Will provide Toradol shot here today.  Advised to go to emergency room if pain not improving, pain worsening or not urinating in the next 8-10 hours. Discussed strict return precautions. Patient verbalized understanding and is agreeable with plan.   Final Clinical Impressions(s) / UC Diagnoses   Final diagnoses:  Pyelonephritis    ED Discharge Orders        Ordered    ciprofloxacin (CIPRO) 500 MG tablet  Every 12 hours     03/06/18 1401       Controlled Substance Prescriptions [place] Controlled Substance Registry consulted? Not Applicable   Janith Lima, PA-C 03/06/18 1407    Harrie Cazarez, [place] C, [place] 03/06/18 1435

## 2018-03-06 NOTE — Discharge Instructions (Addendum)
Please take tylenol and ibuprofen for fever and pain  Please begin Cipro twice daily for the next 7 days.  If you have not urinated in 8-10 hours, have worsening pain or symptoms not improving with treatment please return here or go to emergency room.  Please follow-up with your primary doctor

## 2018-03-06 NOTE — ED Triage Notes (Signed)
Pt sts difficulty voiding today; pt sts some pain with urination; pt with hx of prostate issues

## 2018-03-08 ENCOUNTER — Telehealth (HOSPITAL_COMMUNITY): Payer: Self-pay

## 2018-03-08 LAB — URINE CULTURE: Culture: 50000 — AB

## 2018-03-08 NOTE — Telephone Encounter (Signed)
Pt contacted regarding test results from recent visit. Urine culture was positive for E.Coli and was given Cipro at urgent care visit. Pt educated on completing antibiotic and to follow up if symptoms are persistent. Pt complaining of diarrhea since starting antibiotic. Encouraged patient to be seen by his doctor if symptoms persist.

## 2018-04-16 MED FILL — TELMISARTAN 40 MG TABLET: 40 | 90 days supply | Qty: 90 | Fill #1

## 2018-04-16 MED FILL — TAMSULOSIN HCL 0.4 MG CAP: 0.4 | 90 days supply | Qty: 90 | Fill #1

## 2018-04-16 MED FILL — PANTOPRAZOLE SOD DR 40 MG T: 40 | 90 days supply | Qty: 90 | Fill #1

## 2018-06-21 MED FILL — SYMBICORT 80-4.5 MCG INH: 80-4.5 | 90 days supply | Qty: 31 | Fill #1

## 2018-07-15 MED FILL — TAMSULOSIN HCL 0.4 MG CAP: 0.4 | 90 days supply | Qty: 90 | Fill #2

## 2018-07-15 MED FILL — TELMISARTAN 40 MG TABLET: 40 | 90 days supply | Qty: 90 | Fill #2

## 2018-07-15 MED FILL — PANTOPRAZOLE SOD DR 40 MG T: 40 | 90 days supply | Qty: 90 | Fill #2

## 2018-10-14 MED FILL — PANTOPRAZOLE SOD DR 40 MG T: 40 | 90 days supply | Qty: 90 | Fill #3

## 2018-10-14 MED FILL — TELMISARTAN 40 MG TABLET: 40 | 90 days supply | Qty: 90 | Fill #3

## 2018-10-14 MED FILL — TAMSULOSIN HCL 0.4 MG CAP: 0.4 | 90 days supply | Qty: 90 | Fill #3

## 2018-10-14 MED FILL — SYMBICORT 80-4.5 MCG INH: 80-4.5 | 90 days supply | Qty: 31 | Fill #2

## 2018-12-09 MED FILL — AZELASTINE HCL 137 MCG SPRY: 0.1 | 25 days supply | Qty: 30 | Fill #0

## 2018-12-14 MED FILL — PROAIR HFA 90 MCG INHALER: 108 (90 BAS | 17 days supply | Qty: 9 | Fill #0

## 2019-01-31 MED FILL — TAMSULOSIN HCL 0.4 MG CAP: 0.4 | 90 days supply | Qty: 90 | Fill #0

## 2019-01-31 MED FILL — PANTOPRAZOLE SOD DR 40 MG T: 40 | 90 days supply | Qty: 90 | Fill #0

## 2019-01-31 MED FILL — TELMISARTAN 40 MG TABS: 40 | 90 days supply | Qty: 90 | Fill #0

## 2019-05-04 MED FILL — SYMBICORT 80-4.5 MCG INH: 80-4.5 | 90 days supply | Qty: 31 | Fill #0

## 2019-05-04 MED FILL — PANTOPRAZOLE SOD DR 40 MG T: 40 | 90 days supply | Qty: 90 | Fill #1

## 2019-05-04 MED FILL — TAMSULOSIN HCL 0.4 MG CAP: 0.4 | 90 days supply | Qty: 90 | Fill #1

## 2019-05-04 MED FILL — TELMISARTAN 40 MG TABLET: 40 | 90 days supply | Qty: 90 | Fill #1

## 2019-07-06 MED FILL — PROAIR HFA 90 MCG INHALER: 108 (90 BAS | 17 days supply | Qty: 9 | Fill #1

## 2019-08-03 MED FILL — TELMISARTAN 40 MG TABLET: 40 | 90 days supply | Qty: 90 | Fill #2

## 2019-08-03 MED FILL — TAMSULOSIN HCL 0.4 MG CAP: 0.4 | 90 days supply | Qty: 90 | Fill #2

## 2019-08-03 MED FILL — PANTOPRAZOLE SOD DR 40 MG T: 40 | 90 days supply | Qty: 90 | Fill #2

## 2019-08-03 MED FILL — SYMBICORT 80-4.5 MCG INH: 80-4.5 | 90 days supply | Qty: 31 | Fill #1

## 2019-11-07 MED FILL — PANTOPRAZOLE SOD DR 40 MG T: 40 | 90 days supply | Qty: 90 | Fill #3

## 2019-11-07 MED FILL — SYMBICORT 80-4.5 MCG INH: 80-4.5 | 90 days supply | Qty: 31 | Fill #2

## 2019-11-07 MED FILL — TELMISARTAN 40 MG TABLET: 40 | 90 days supply | Qty: 90 | Fill #3

## 2019-11-07 MED FILL — TAMSULOSIN HCL 0.4 MG CAP: 0.4 | 90 days supply | Qty: 90 | Fill #3

## 2020-01-06 ENCOUNTER — Other Ambulatory Visit (HOSPITAL_COMMUNITY): Payer: Self-pay | Admitting: Family Medicine

## 2020-01-06 MED FILL — ALBUTEROL SULFATE HFA 108 (: 108 (90 BAS | 17 days supply | Qty: 9 | Fill #0

## 2020-01-06 MED FILL — AZELASTINE HCL 137 MCG SPRY: 0.1 | 30 days supply | Qty: 30 | Fill #0

## 2020-01-16 MED FILL — PANTOPRAZOLE SOD DR 40 MG T: 40 | 90 days supply | Qty: 90 | Fill #0

## 2020-01-16 MED FILL — SYMBICORT 80-4.5 MCG INH: 80-4.5 | 90 days supply | Qty: 31 | Fill #0

## 2020-01-16 MED FILL — TAMSULOSIN HCL 0.4 MG CAP: 0.4 | 90 days supply | Qty: 90 | Fill #0

## 2020-01-16 MED FILL — TELMISARTAN 40 MG TABLET: 40 | 90 days supply | Qty: 90 | Fill #0

## 2020-02-06 MED FILL — TAMSULOSIN HCL 0.4 MG CAP: 0.4 | 90 days supply | Qty: 90 | Fill #0

## 2020-02-06 MED FILL — SYMBICORT 80-4.5 MCG INH: 80-4.5 | 90 days supply | Qty: 31 | Fill #0

## 2020-02-06 MED FILL — TELMISARTAN 40 MG TABLET: 40 | 90 days supply | Qty: 90 | Fill #0

## 2020-02-06 MED FILL — PANTOPRAZOLE SOD DR 40 MG T: 40 | 90 days supply | Qty: 90 | Fill #0

## 2020-04-17 ENCOUNTER — Encounter (HOSPITAL_COMMUNITY): Payer: Self-pay | Admitting: Emergency Medicine

## 2020-04-17 ENCOUNTER — Other Ambulatory Visit: Payer: Self-pay

## 2020-04-17 ENCOUNTER — Inpatient Hospital Stay (HOSPITAL_COMMUNITY)
Admission: EM | Admit: 2020-04-17 | Discharge: 2020-04-19 | DRG: 389 | Disposition: A | Discharge status: Home / Self Care | Payer: Medicare Other | Attending: Internal Medicine | Admitting: Internal Medicine

## 2020-04-17 ENCOUNTER — Emergency Department (HOSPITAL_COMMUNITY): Payer: Medicare Other

## 2020-04-17 DIAGNOSIS — N400 Enlarged prostate without lower urinary tract symptoms: Secondary | ICD-10-CM | POA: Diagnosis present

## 2020-04-17 DIAGNOSIS — Z7951 Long term (current) use of inhaled steroids: Secondary | ICD-10-CM | POA: Diagnosis not present

## 2020-04-17 DIAGNOSIS — Z5329 Procedure and treatment not carried out because of patient's decision for other reasons: Secondary | ICD-10-CM | POA: Diagnosis not present

## 2020-04-17 DIAGNOSIS — Z885 Allergy status to narcotic agent status: Secondary | ICD-10-CM

## 2020-04-17 DIAGNOSIS — I509 Heart failure, unspecified: Secondary | ICD-10-CM | POA: Diagnosis present

## 2020-04-17 DIAGNOSIS — Z86010 Personal history of colonic polyps: Secondary | ICD-10-CM | POA: Diagnosis not present

## 2020-04-17 DIAGNOSIS — G8929 Other chronic pain: Secondary | ICD-10-CM | POA: Diagnosis present

## 2020-04-17 DIAGNOSIS — Z20822 Contact with and (suspected) exposure to covid-19: Secondary | ICD-10-CM | POA: Diagnosis present

## 2020-04-17 DIAGNOSIS — Z888 Allergy status to other drugs, medicaments and biological substances status: Secondary | ICD-10-CM

## 2020-04-17 DIAGNOSIS — Z87891 Personal history of nicotine dependence: Secondary | ICD-10-CM

## 2020-04-17 DIAGNOSIS — J302 Other seasonal allergic rhinitis: Secondary | ICD-10-CM | POA: Diagnosis present

## 2020-04-17 DIAGNOSIS — Z79899 Other long term (current) drug therapy: Secondary | ICD-10-CM

## 2020-04-17 DIAGNOSIS — J449 Chronic obstructive pulmonary disease, unspecified: Secondary | ICD-10-CM

## 2020-04-17 DIAGNOSIS — Z0189 Encounter for other specified special examinations: Secondary | ICD-10-CM | POA: Diagnosis not present

## 2020-04-17 DIAGNOSIS — Z981 Arthrodesis status: Secondary | ICD-10-CM | POA: Diagnosis not present

## 2020-04-17 DIAGNOSIS — I10 Essential (primary) hypertension: Secondary | ICD-10-CM | POA: Diagnosis present

## 2020-04-17 DIAGNOSIS — Z6841 Body Mass Index (BMI) 40.0 and over, adult: Secondary | ICD-10-CM | POA: Diagnosis not present

## 2020-04-17 DIAGNOSIS — I5189 Other ill-defined heart diseases: Secondary | ICD-10-CM | POA: Diagnosis not present

## 2020-04-17 DIAGNOSIS — K56609 Unspecified intestinal obstruction, unspecified as to partial versus complete obstruction: Secondary | ICD-10-CM | POA: Diagnosis present

## 2020-04-17 DIAGNOSIS — K56600 Partial intestinal obstruction, unspecified as to cause: Principal | ICD-10-CM | POA: Diagnosis present

## 2020-04-17 DIAGNOSIS — I110 Hypertensive heart disease with heart failure: Secondary | ICD-10-CM | POA: Diagnosis present

## 2020-04-17 DIAGNOSIS — Z8249 Family history of ischemic heart disease and other diseases of the circulatory system: Secondary | ICD-10-CM | POA: Diagnosis not present

## 2020-04-17 DIAGNOSIS — K219 Gastro-esophageal reflux disease without esophagitis: Secondary | ICD-10-CM | POA: Diagnosis present

## 2020-04-17 LAB — SARS CORONAVIRUS 2 BY RT PCR (HOSPITAL ORDER, PERFORMED IN ~~LOC~~ HOSPITAL LAB): SARS Coronavirus 2: NEGATIVE

## 2020-04-17 LAB — COMPREHENSIVE METABOLIC PANEL
ALT: 35 U/L (ref 0–44)
AST: 34 U/L (ref 15–41)
Albumin: 4.1 g/dL (ref 3.5–5.0)
Alkaline Phosphatase: 72 U/L (ref 38–126)
Anion gap: 13 (ref 5–15)
BUN: 20 mg/dL (ref 8–23)
CO2: 22 mmol/L (ref 22–32)
Calcium: 9.2 mg/dL (ref 8.9–10.3)
Chloride: 103 mmol/L (ref 98–111)
Creatinine, Ser: 1.26 mg/dL — ABNORMAL HIGH (ref 0.61–1.24)
GFR calc Af Amer: >60 mL/min (ref >60)
GFR calc non Af Amer: 58 mL/min — ABNORMAL LOW (ref >60)
Glucose, Bld: 117 mg/dL — ABNORMAL HIGH (ref 70–99)
Potassium: 4.8 mmol/L (ref 3.5–5.1)
Sodium: 138 mmol/L (ref 135–145)
Total Bilirubin: 1.6 mg/dL — ABNORMAL HIGH (ref 0.3–1.2)
Total Protein: 7.7 g/dL (ref 6.5–8.1)

## 2020-04-17 LAB — CBC
HCT: 43.7 % (ref 39.0–52.0)
Hemoglobin: 14.8 g/dL (ref 13.0–17.0)
MCH: 32.7 pg (ref 26.0–34.0)
MCHC: 33.9 g/dL (ref 30.0–36.0)
MCV: 96.5 fL (ref 80.0–100.0)
Platelets: 268 10*3/uL (ref 150–400)
RBC: 4.53 MIL/uL (ref 4.22–5.81)
RDW: 13 % (ref 11.5–15.5)
WBC: 9.6 10*3/uL (ref 4.0–10.5)
nRBC: 0 % (ref 0.0–0.2)

## 2020-04-17 LAB — URINALYSIS, ROUTINE W REFLEX MICROSCOPIC
Bilirubin Urine: NEGATIVE
Glucose, UA: NEGATIVE mg/dL
Hgb urine dipstick: NEGATIVE
Ketones, ur: 20 mg/dL — AB
Nitrite: NEGATIVE
Protein, ur: NEGATIVE mg/dL
Specific Gravity, Urine: 1.025 (ref 1.005–1.030)
pH: 5 (ref 5.0–8.0)

## 2020-04-17 LAB — LIPASE, BLOOD: Lipase: 29 U/L (ref 11–51)

## 2020-04-17 MED ORDER — IOHEXOL 300 MG/ML  SOLN
100.0000 mL | Freq: Once | INTRAMUSCULAR | Status: AC | PRN
  Administered 2020-04-17: 100 mL via INTRAVENOUS

## 2020-04-17 MED ORDER — METOCLOPRAMIDE HCL 5 MG/ML IJ SOLN
10.0000 mg | Freq: Once | INTRAMUSCULAR | Status: AC
  Administered 2020-04-17: 10 mg via INTRAVENOUS
  Filled 2020-04-17: qty 2

## 2020-04-17 MED ORDER — ACETAMINOPHEN 650 MG RE SUPP: 650.0000 mg | Freq: Four times a day (QID) | RECTAL | Status: DC | PRN

## 2020-04-17 MED ORDER — HYDROMORPHONE HCL 1 MG/ML IJ SOLN: 0.5000 mg | INTRAMUSCULAR | Status: DC | PRN

## 2020-04-17 MED ORDER — DIATRIZOATE MEGLUMINE & SODIUM 66-10 % PO SOLN
90.0000 mL | Freq: Once | ORAL | Status: DC
  Filled 2020-04-17 (×2): qty 90

## 2020-04-17 MED ORDER — METOPROLOL TARTRATE 5 MG/5ML IV SOLN: 5.0000 mg | Freq: Three times a day (TID) | INTRAVENOUS | Status: DC | PRN

## 2020-04-17 MED ORDER — AZELASTINE HCL 0.1 % NA SOLN
2.0000 | Freq: Two times a day (BID) | NASAL | Status: DC | PRN
  Filled 2020-04-17: qty 30

## 2020-04-17 MED ORDER — ONDANSETRON HCL 4 MG/2ML IJ SOLN: 4.0000 mg | Freq: Four times a day (QID) | INTRAMUSCULAR | Status: DC | PRN

## 2020-04-17 MED ORDER — ALBUTEROL SULFATE (2.5 MG/3ML) 0.083% IN NEBU: 2.5000 mg | INHALATION_SOLUTION | Freq: Four times a day (QID) | RESPIRATORY_TRACT | Status: DC | PRN

## 2020-04-17 MED ORDER — SODIUM CHLORIDE 0.9 % IV SOLN: INTRAVENOUS | Status: AC

## 2020-04-17 MED ORDER — ACETAMINOPHEN 325 MG PO TABS: 650.0000 mg | ORAL_TABLET | Freq: Four times a day (QID) | ORAL | Status: DC | PRN

## 2020-04-17 MED ORDER — SODIUM CHLORIDE 0.9 % IV BOLUS
1000.0000 mL | Freq: Once | INTRAVENOUS | Status: AC
  Administered 2020-04-17: 1000 mL via INTRAVENOUS

## 2020-04-17 MED ORDER — FENTANYL CITRATE (PF) 100 MCG/2ML IJ SOLN
50.0000 ug | Freq: Once | INTRAMUSCULAR | Status: AC
  Administered 2020-04-17: 50 ug via INTRAVENOUS
  Filled 2020-04-17: qty 2

## 2020-04-17 MED ORDER — ENOXAPARIN SODIUM 40 MG/0.4ML ~~LOC~~ SOLN
40.0000 mg | Freq: Every day | SUBCUTANEOUS | Status: DC
  Administered 2020-04-17 – 2020-04-18 (×2): 40 mg via SUBCUTANEOUS
  Filled 2020-04-17: qty 0.4

## 2020-04-17 MED ORDER — ALBUTEROL SULFATE HFA 108 (90 BASE) MCG/ACT IN AERS: 2.0000 | INHALATION_SPRAY | Freq: Four times a day (QID) | RESPIRATORY_TRACT | Status: DC | PRN

## 2020-04-17 MED ORDER — PANTOPRAZOLE SODIUM 40 MG IV SOLR
40.0000 mg | INTRAVENOUS | Status: DC
  Administered 2020-04-17 – 2020-04-18 (×2): 40 mg via INTRAVENOUS
  Filled 2020-04-17 (×2): qty 40

## 2020-04-17 MED ORDER — SODIUM CHLORIDE 0.9% FLUSH
3.0000 mL | Freq: Two times a day (BID) | INTRAVENOUS | Status: DC
  Administered 2020-04-17 – 2020-04-18 (×3): 3 mL via INTRAVENOUS

## 2020-04-17 MED ORDER — MOMETASONE FURO-FORMOTEROL FUM 100-5 MCG/ACT IN AERO
2.0000 | INHALATION_SPRAY | Freq: Two times a day (BID) | RESPIRATORY_TRACT | Status: DC
  Administered 2020-04-18 – 2020-04-19 (×3): 2 via RESPIRATORY_TRACT
  Filled 2020-04-17: qty 8.8

## 2020-04-17 MED ORDER — ONDANSETRON HCL 4 MG PO TABS: 4.0000 mg | ORAL_TABLET | Freq: Four times a day (QID) | ORAL | Status: DC | PRN

## 2020-04-17 MED ORDER — FENTANYL CITRATE (PF) 100 MCG/2ML IJ SOLN
25.0000 ug | Freq: Once | INTRAMUSCULAR | Status: DC
  Filled 2020-04-17: qty 2

## 2020-04-17 NOTE — ED Triage Notes (Signed)
Pt endorses abd pain since Thursday with N/V. Endorses hx of hernia and SBO. Last BM this AM.

## 2020-04-17 NOTE — H&P (Signed)
History and Physical    Shane Clark [id number] DOB: [birth year] DOA: 04/17/2020  PCP: Gaynelle Arabian, MD Patient coming from: Home  Chief Complaint: Abdominal pain, vomiting  HPI: Shane Clark is a 69 y.o. male with medical history significant of HTN, GERD, COPD, chronic back pain, BPH, Allergies who presents for nausea, vomiting and abdominal pain.  Mr. Mclouth reports that he ate supper normally on last Thursday.  After the meal he developed cramping.  On Friday he threw up a few times, but he was passing gas and having bowel movements so he put himself on bowel rest.  He was better in the next day or 2.  He was able to eat an egg and half a bowl of grits on Monday and this stayed down.  However, since that time he has been having abdominal pain, worse on the left, distention and vomiting no matter what he tried to take in.  The curious thing, he continues to pas flatus and have bowel movements.  He reports that this has happened every time he has a small bowel obstruction.  He would like to be placed on bowel rest, but if he needs surgery he would prefer Dr. Donne Hazel.  He has had an SBO a few times, and they tend to come on every 2 years or so.  He has been SOB when the pain gets bad.  He refuses an NGT due to previous perforated septum related to one.  He feels that bowel rest alone would be find.  He has had at least 3 abdominal surgeries in the past.  He has had an SBO due to a mesh issue in the past as well.  Prior to Thursday, he was in his usual state of health and denies other symptoms.   ED Course: In the ED, he had a mildly elevated Cr of 1.26, though this does seem to be in line with previous numbers.  He had an elevated T bili of 1.6.   Urine showed low ketones.  CT abdomen showed an SBO.  Surgery was consulted as well.   Review of Systems: As per HPI otherwise all other systems reviewed and are negative.   Past Medical History:  Diagnosis Date  . Allergy   . Arthritis   .  Asthma   . BPH (benign prostatic hyperplasia)   . Chronic back pain    "all over"  . COPD (chronic obstructive pulmonary disease) ([place])   . GERD (gastroesophageal reflux disease)   . Hx of adenomatous colonic polyps 03/22/2016  . Hypertension   . Influenza 11/20/2011  . Sepsis ([place]) 02/05/2016    Past Surgical History:  Procedure Laterality Date  . ABDOMINAL HERNIA REPAIR    . ANTERIOR CERVICAL DECOMP/DISCECTOMY FUSION     C6-8  . APPENDECTOMY  1960s  . BACK SURGERY    . COLONOSCOPY    . HERNIA REPAIR    . INSERTION DISTRACTION DEVICE LUMBAR POSTERIOR SPINOUS PROCESS    . KNEE ARTHROSCOPY Left   . SHOULDER SURGERY Left 02/11/2017  . SMALL BOWEL REPAIR      Social History  reports that he has quit smoking. His smoking use included cigarettes. He has a 2.40 pack-year smoking history. He has quit using smokeless tobacco. He reports that he does not drink alcohol or use drugs.  Allergies  Allergen Reactions  . Oxycodone-Acetaminophen Anaphylaxis  . Advair Diskus [Fluticasone-Salmeterol]     unknown  . Pentazocine Lactate Nausea Only and Other (See Comments)  Stopped breathing "talwin"    Family History  Problem Relation Age of Onset  . Coronary artery disease Father   . Pancreatic cancer Brother   . Stroke Brother   . Heart disease Brother   . Dementia Mother   . Diabetes type II Other   . Colon cancer Neg Hx   . Colon polyps Neg Hx   . Rectal cancer Neg Hx   . Stomach cancer Neg Hx     Prior to Admission medications   Medication Sig Start Date End Date Taking? Authorizing Provider  albuterol (PROVENTIL HFA;VENTOLIN HFA) 108 (90 BASE) MCG/ACT inhaler Inhale 2 puffs into the lungs every 6 (six) hours as needed for wheezing or shortness of breath.    Yes [provider]  azelastine (ASTELIN) 0.1 % nasal spray Place 2 sprays into both nostrils 2 (two) times daily as needed for congestion. 01/06/20  Yes [provider]  budesonide-formoterol (SYMBICORT)  80-4.5 MCG/ACT inhaler Inhale 2 puffs into the lungs 2 (two) times daily.   Yes [provider]  loratadine (CLARITIN) 10 MG tablet Take 10 mg by mouth daily as needed for allergies.    Yes [provider]  pantoprazole (PROTONIX) 40 MG tablet Take 40 mg by mouth daily.     Yes [provider]  Tamsulosin HCl (FLOMAX) 0.4 MG CAPS Take 0.4 mg by mouth daily.     Yes [provider]  telmisartan (MICARDIS) 40 MG tablet Take 40 mg by mouth daily. 11/28/15  Yes [provider]                                       Physical Exam: Vitals:   04/17/20 1245 04/17/20 1246 04/17/20 1500 04/17/20 1645  BP: 95/69  105/70 92/61  Pulse:  76 60 78  Resp:    14  Temp:      TempSrc:      SpO2:  94% 99% 97%  Weight:      Height:        Constitutional: NAD, calm, comfortable, but reports pain is returning Eyes:  lids and conjunctivae normal ENMT: Mucous membranes are dry.  He has an enlarged nasal polyp and or deviated septum into the left nostril.  He has boggy appearing membranes bilaterally  Neck: normal, supple Respiratory: CTAB, no wheezing Cardiovascular: RR, NR, no murmur Abdomen: + mild TTP over the left quadrants, distended but soft.  He has a healed midline incision Musculoskeletal: Normal muscle tone Skin: no rashes, lesions, ulcers on exposed skin Neurologic: Painful movement, but moving all extremities Psychiatric: Normal judgment and insight. Alert and oriented x 3. Normal mood.    Labs on Admission: I have personally reviewed following labs and imaging studies  CBC: Recent Labs  Lab 04/17/20 0822  WBC 9.6  HGB 14.8  HCT 43.7  MCV 96.5  PLT [id number]    Basic Metabolic Panel: Recent Labs  Lab 04/17/20 0822  NA 138  K 4.8  CL 103  CO2 22  GLUCOSE 117*  BUN 20  CREATININE 1.26*  CALCIUM 9.2    GFR: Estimated Creatinine Clearance: 71.4 mL/min (A) (by C-G formula based on SCr of 1.26 mg/dL (H)).  Liver Function  Tests: Recent Labs  Lab 04/17/20 0822  AST 34  ALT 35  ALKPHOS 72  BILITOT 1.6*  PROT 7.7  ALBUMIN 4.1    Urine analysis:  Component Value Date/Time   COLORURINE YELLOW 04/17/2020 1630   APPEARANCEUR HAZY (A) 04/17/2020 1630   LABSPEC 1.025 04/17/2020 1630   PHURINE 5.0 04/17/2020 1630   GLUCOSEU NEGATIVE 04/17/2020 1630   HGBUR NEGATIVE 04/17/2020 1630   BILIRUBINUR NEGATIVE 04/17/2020 1630   KETONESUR 20 (A) 04/17/2020 1630   PROTEINUR NEGATIVE 04/17/2020 1630   UROBILINOGEN >=8.0 03/06/2018 1341   NITRITE NEGATIVE 04/17/2020 1630   LEUKOCYTESUR MODERATE (A) 04/17/2020 1630    Radiological Exams on Admission: CT ABDOMEN PELVIS W CONTRAST  Result Date: 04/17/2020 CLINICAL DATA:  Abdominal pain. History of small-bowel obstruction EXAM: CT ABDOMEN AND PELVIS WITH CONTRAST TECHNIQUE: Multidetector CT imaging of the abdomen and pelvis was performed using the standard protocol following bolus administration of intravenous contrast. CONTRAST:  194mL OMNIPAQUE IOHEXOL 300 MG/ML  SOLN COMPARISON:  CT 02/13/2018 FINDINGS: Lower chest: Streaky bibasilar opacities favored atelectasis. Bilateral gynecomastia. Hepatobiliary: Tiny low-density lesion at the right hepatic dome, too small to definitively characterize, but most likely represents cyst or hemangioma. Liver appears otherwise unremarkable. Mild distension of the gallbladder without hyperdense gallstone or surrounding inflammatory changes. No biliary dilatation. Pancreas: Unremarkable. No pancreatic ductal dilatation or surrounding inflammatory changes. Spleen: Normal in size without focal abnormality. Adrenals/Urinary Tract: Unremarkable adrenal glands. Stable upper pole right renal cyst. Kidneys appear otherwise unremarkable with symmetric enhancement. No renal stone or hydronephrosis. Urinary bladder is decompressed but appears diffusely thick walled. Stomach/Bowel: Stomach appears unremarkable. There are multiple dilated loops of  small bowel within the mid abdomen with air-fluid levels measuring up to 5.4 cm in diameter. Transition point to relatively collapsed small bowel is noted within the central abdomen just inferior to the level of the umbilicus (series 2, image 72; series 5, images 44-51). The large bowel is largely decompressed. No focal bowel wall thickening or inflammatory changes. Vascular/Lymphatic: Scattered aortic atherosclerosis. Infrarenal abdominal aorta measures up to 2.9 cm in AP dimension just proximal to the level of the bifurcation, similar to prior. No abdominopelvic lymphadenopathy. Reproductive: Moderate prostatomegaly. Other: No free fluid. No organized fluid collection. No pneumoperitoneum. Musculoskeletal: Degenerative changes without new or acute osseous findings. IMPRESSION: 1. Small-bowel obstruction with transition point within the central abdomen just inferior to the level of the umbilicus, possibly secondary to adhesions. 2. Moderate prostatomegaly. 3. Urinary bladder is decompressed but appears diffusely thick walled, which may be related to chronic outlet obstruction. Correlate with urinalysis to exclude cystitis. 4. Infrarenal abdominal aorta measures up to 2.9 cm in AP dimension just proximal to the level of the bifurcation, similar to prior. Recommend followup by ultrasound in 5 years. This recommendation follows ACR consensus guidelines: White Paper of the ACR Incidental Findings Committee II on Vascular Findings. J Am Coll Radiol 2013; 10:789-794. Aortic aneurysm NOS (ICD10-I71.9) These results were called by telephone at the time of interpretation on 04/17/2020 at 4:18 pm to provider [facility] , who verbally acknowledged these results. Electronically Signed   By: Davina Poke D.O.   On: 04/17/2020 16:18    EKG: Independently reviewed. NSR, no ST or TW changes.   Assessment/Plan SBO (small bowel obstruction) Nausea, vomiting - elevated T bili, likely in the setting of vomiting - Patient  refusing NGT given history, I discontinued it.  Not clear if he will be able to get Gastrograffin - Gen Surg consult pending (ordered by EDP) - Pain control with hydromorphone - Nausea control with Zofran - Monitor for improvement - IVF with NS at 100cc/hr - NPO - Ins and outs  COPD (chronic obstructive pulmonary disease) - Continue home symbicort and albuterol    Diastolic dysfunction - Last TTE from 2016 with mild dysfunction, on no diuretics - Monitor for volume overload - Chronic    BPH (benign prostatic hyperplasia) - Restart tamsulosin when able to take PO    Essential hypertension - Hold telmisartan - IV metoprolol for very high blood pressures only   DVT prophylaxis: Lovenox  Code Status:   Full  Disposition Plan:   Patient is from:  Home  Anticipated DC to:  Home  Anticipated DC date:  3-4 days  Anticipated DC barriers: Possible need for surgery  Consults called:  Gen Surg, pending  Admission status:  INP, M-S   Severity of Illness: The appropriate patient status for this patient is INPATIENT. Inpatient status is judged to be reasonable and necessary in order to provide the required intensity of service to ensure the patient's safety. The patient's presenting symptoms, physical exam findings, and initial radiographic and laboratory data in the context of their chronic comorbidities is felt to place them at high risk for further clinical deterioration. Furthermore, it is not anticipated that the patient will be medically stable for discharge from the hospital within 2 midnights of admission. The following factors support the patient status of inpatient.   " The patient's presenting symptoms include abdominal distention, SBO. " The worrisome physical exam findings include distention. " The initial radiographic and laboratory data are worrisome because of SBO on imaging. " The chronic co-morbidities include HTN, GERD, COPD.   * I certify that at the point of  admission it is my clinical judgment that the patient will require inpatient hospital care spanning beyond 2 midnights from the point of admission due to high intensity of service, high risk for further deterioration and high frequency of surveillance required.Gilles Chiquito MD [facility]  How to contact the [facility] Attending or Consulting provider [place] or covering provider during after hours [place], for this patient?   1. Check the care team in [facility] and look for a) attending/consulting [facility] provider listed and b) the [facility] team listed 2. Log into [email or web address] and use [place]'s universal password to access. If you do not have the password, please contact the hospital operator. 3. Locate the [facility] provider you are looking for under [facility] and page to a number that you can be directly reached. 4. If you still have difficulty reaching the provider, please page the [facility] (Director on Call) for the Hospitalists listed on amion for assistance.  04/17/2020, 6:50 PM

## 2020-04-17 NOTE — ED Notes (Signed)
Pt refusing NG tube placement at this time. Admitting MD notified.

## 2020-04-17 NOTE — ED Notes (Addendum)
Attempted to call report  [phone]:  2nd attempt to call report

## 2020-04-17 NOTE — ED Provider Notes (Addendum)
[facility] EMERGENCY DEPARTMENT Provider Note   CSN: [id number] Arrival date & time: 04/17/20  0759     History Chief Complaint  Patient presents with  . Abdominal Pain    Shane Clark is a 69 y.o. male.  HPI 69 year old male with a history of COPD, hypertension, SBO, abdominal hernia repair presents to the ER for abdominal pain which began on Thursday.  Patient noted left-sided abdominal pain with nausea and nonbloody nonbilious vomiting on Thursday.  He states that on Friday he had a little bit of chicken soup and noted some improvement in his symptoms, but they again returned on Sunday.  He reports a history of small bowel obstructions and hernia repair.  Last bowel movement was this morning and was soft with no evidence of blood.  Denies any dysuria, hematuria.  No chest pain, shortness of breath, back pain, headache, weakness, dizziness, syncope.    Past Medical History:  Diagnosis Date  . Allergy   . Arthritis   . Asthma   . BPH (benign prostatic hyperplasia)   . Chronic back pain    "all over"  . COPD (chronic obstructive pulmonary disease) ([place])   . GERD (gastroesophageal reflux disease)   . Hx of adenomatous colonic polyps 03/22/2016  . Hypertension   . Influenza 11/20/2011  . Sepsis ([place]) 02/05/2016    Patient Active Problem List   Diagnosis Date Noted  . SBO (small bowel obstruction) ([place]) 02/13/2018  . Obesity (BMI 30-39.9) 02/16/2017  . Small bowel obstruction ([place]) 02/15/2017  . SLAP tear of shoulder 02/11/2017  . Impingement syndrome of left shoulder 02/05/2017  . Hx of adenomatous colonic polyps 03/22/2016  . Essential hypertension 02/05/2016  . COPD (chronic obstructive pulmonary disease) ([place]) 11/21/2011  . Diastolic dysfunction [id number]  . BPH (benign prostatic hyperplasia) 11/21/2011  . Hypotension 11/20/2011    Past Surgical History:  Procedure Laterality Date  . ABDOMINAL HERNIA REPAIR    . ANTERIOR CERVICAL  DECOMP/DISCECTOMY FUSION     C6-8  . APPENDECTOMY  1960s  . BACK SURGERY    . COLONOSCOPY    . HERNIA REPAIR    . INSERTION DISTRACTION DEVICE LUMBAR POSTERIOR SPINOUS PROCESS    . KNEE ARTHROSCOPY Left   . SHOULDER SURGERY Left 02/11/2017  . SMALL BOWEL REPAIR         Family History  Problem Relation Age of Onset  . Coronary artery disease Father   . Pancreatic cancer Brother   . Stroke Brother   . Heart disease Brother   . Diabetes type II Other   . Colon cancer Neg Hx   . Colon polyps Neg Hx   . Rectal cancer Neg Hx   . Stomach cancer Neg Hx     Social History   Tobacco Use  . Smoking status: Former Smoker    Packs/day: 0.12    Years: 20.00    Pack years: 2.40    Types: Cigarettes  . Smokeless tobacco: Former Systems developer  . Tobacco comment: "quit smoking cigarettes in the 1980s"  Substance Use Topics  . Alcohol use: No    Alcohol/week: 0.0 standard drinks  . Drug use: No    Home Medications Prior to Admission medications   Medication Sig Start Date End Date Taking? Authorizing Provider  albuterol (PROVENTIL HFA;VENTOLIN HFA) 108 (90 BASE) MCG/ACT inhaler Inhale 2 puffs into the lungs every 6 (six) hours as needed for wheezing or shortness of breath.    Yes [provider]  azelastine (ASTELIN) 0.1 % nasal spray Place 2 sprays into both nostrils 2 (two) times daily as needed for congestion. 01/06/20  Yes [provider]  budesonide-formoterol (SYMBICORT) 80-4.5 MCG/ACT inhaler Inhale 2 puffs into the lungs 2 (two) times daily.   Yes [provider]  loratadine (CLARITIN) 10 MG tablet Take 10 mg by mouth daily as needed for allergies.    Yes [provider]  pantoprazole (PROTONIX) 40 MG tablet Take 40 mg by mouth daily.     Yes [provider]  Tamsulosin HCl (FLOMAX) 0.4 MG CAPS Take 0.4 mg by mouth daily.     Yes [provider]  telmisartan (MICARDIS) 40 MG tablet Take 40 mg by mouth daily. 11/28/15  Yes [provider]  HYDROcodone-acetaminophen (NORCO) 7.5-325 MG tablet Take 1-2 tablets by mouth every 6 (six) hours as needed for moderate pain. Patient not taking: Reported on 04/17/2020 02/11/17   Leandrew Koyanagi, MD  methocarbamol (ROBAXIN) 750 MG tablet Take 1 tablet (750 mg total) by mouth 2 (two) times daily as needed for muscle spasms. Patient not taking: Reported on 04/17/2020 02/11/17   Leandrew Koyanagi, MD  ondansetron (ZOFRAN) 4 MG tablet Take 1-2 tablets (4-8 mg total) by mouth every 8 (eight) hours as needed for nausea or vomiting. Patient not taking: Reported on 04/17/2020 02/11/17   Leandrew Koyanagi, MD  promethazine (PHENERGAN) 25 MG suppository Place 1 suppository (25 mg total) rectally every 6 (six) hours as needed for nausea or vomiting. Patient not taking: Reported on 04/17/2020 02/13/17   Suzan Slick, NP  promethazine (PHENERGAN) 25 MG tablet Take 1 tablet (25 mg total) by mouth every 6 (six) hours as needed for nausea. Patient not taking: Reported on 04/17/2020 02/11/17   Leandrew Koyanagi, MD    Allergies    Oxycodone-acetaminophen, Advair diskus [fluticasone-salmeterol], and Pentazocine lactate  Review of Systems   Review of Systems  Constitutional: Negative for chills and fever.  HENT: Negative for ear pain and sore throat.   Eyes: Negative for pain and visual disturbance.  Respiratory: Negative for cough, chest tightness and shortness of breath.   Cardiovascular: Negative for chest pain, palpitations and leg swelling.  Gastrointestinal: Positive for abdominal pain, nausea and vomiting. Negative for anal bleeding, blood in stool, constipation and diarrhea.  Genitourinary: Negative for dysuria and hematuria.  Musculoskeletal: Negative for arthralgias and back pain.  Skin: Negative for color change and rash.  Neurological: Negative for dizziness, seizures, syncope, weakness, light-headedness and headaches.  Psychiatric/Behavioral: Negative for confusion.  All other systems reviewed and  are negative.   Physical Exam Updated Vital Signs BP 92/61   Pulse 78   Temp 97.9 F (36.6 C) (Oral)   Resp 14   Ht 5\' 8"  (1.727 m)   Wt 122.5 kg   SpO2 97%   BMI 41.05 kg/m   Physical Exam Vitals and nursing note reviewed.  Constitutional:      Appearance: He is well-developed.  HENT:     Head: Normocephalic and atraumatic.  Eyes:     Extraocular Movements: Extraocular movements intact.     Conjunctiva/sclera: Conjunctivae normal.  Cardiovascular:     Rate and Rhythm: Normal rate and regular rhythm.     Heart sounds: Normal heart sounds. No murmur.  Pulmonary:     Effort: Pulmonary effort is normal. No respiratory distress.     Breath sounds: Normal breath sounds.     Comments:   Abdominal:     Palpations:  Abdomen is soft.     Tenderness: There is abdominal tenderness in the left upper quadrant and left lower quadrant. There is guarding. There is no right CVA tenderness, left CVA tenderness or rebound. Negative signs include Murphy's sign and McBurney's sign.     Hernia: No hernia is present.     Comments: Two healed scars one superior and one inferior to umbilicus without evidence of swelling, erythema, dehiscence  Musculoskeletal:     Cervical back: Neck supple.  Skin:    General: Skin is warm and dry.     Capillary Refill: Capillary refill takes less than 2 seconds.     Coloration: Skin is not jaundiced or pale.     Findings: No erythema.  Neurological:     General: No focal deficit present.     Mental Status: He is alert and oriented to person, place, and time.     Motor: No weakness.  Psychiatric:        Mood and Affect: Mood normal.        Behavior: Behavior normal.     ED Results / Procedures / Treatments   Labs (all labs ordered are listed, but only abnormal results are displayed) Labs Reviewed  COMPREHENSIVE METABOLIC PANEL - Abnormal; Notable for the following components:      Result Value   Glucose, Bld 117 (*)    Creatinine, Ser 1.26 (*)     Total Bilirubin 1.6 (*)    GFR calc non Af Amer 58 (*)    All other components within normal limits  URINALYSIS, ROUTINE W REFLEX MICROSCOPIC - Abnormal; Notable for the following components:   APPearance HAZY (*)    Ketones, ur 20 (*)    Leukocytes,Ua MODERATE (*)    Bacteria, UA RARE (*)    All other components within normal limits  SARS CORONAVIRUS 2 BY RT PCR (HOSPITAL ORDER, [facility])  LIPASE, BLOOD  CBC    EKG None  Radiology CT ABDOMEN PELVIS W CONTRAST  Result Date: 04/17/2020 CLINICAL DATA:  Abdominal pain. History of small-bowel obstruction EXAM: CT ABDOMEN AND PELVIS WITH CONTRAST TECHNIQUE: Multidetector CT imaging of the abdomen and pelvis was performed using the standard protocol following bolus administration of intravenous contrast. CONTRAST:  149mL OMNIPAQUE IOHEXOL 300 MG/ML  SOLN COMPARISON:  CT 02/13/2018 FINDINGS: Lower chest: Streaky bibasilar opacities favored atelectasis. Bilateral gynecomastia. Hepatobiliary: Tiny low-density lesion at the right hepatic dome, too small to definitively characterize, but most likely represents cyst or hemangioma. Liver appears otherwise unremarkable. Mild distension of the gallbladder without hyperdense gallstone or surrounding inflammatory changes. No biliary dilatation. Pancreas: Unremarkable. No pancreatic ductal dilatation or surrounding inflammatory changes. Spleen: Normal in size without focal abnormality. Adrenals/Urinary Tract: Unremarkable adrenal glands. Stable upper pole right renal cyst. Kidneys appear otherwise unremarkable with symmetric enhancement. No renal stone or hydronephrosis. Urinary bladder is decompressed but appears diffusely thick walled. Stomach/Bowel: Stomach appears unremarkable. There are multiple dilated loops of small bowel within the mid abdomen with air-fluid levels measuring up to 5.4 cm in diameter. Transition point to relatively collapsed small bowel is noted within the  central abdomen just inferior to the level of the umbilicus (series 2, image 72; series 5, images 44-51). The large bowel is largely decompressed. No focal bowel wall thickening or inflammatory changes. Vascular/Lymphatic: Scattered aortic atherosclerosis. Infrarenal abdominal aorta measures up to 2.9 cm in AP dimension just proximal to the level of the bifurcation, similar to prior. No abdominopelvic lymphadenopathy. Reproductive: Moderate  prostatomegaly. Other: No free fluid. No organized fluid collection. No pneumoperitoneum. Musculoskeletal: Degenerative changes without new or acute osseous findings. IMPRESSION: 1. Small-bowel obstruction with transition point within the central abdomen just inferior to the level of the umbilicus, possibly secondary to adhesions. 2. Moderate prostatomegaly. 3. Urinary bladder is decompressed but appears diffusely thick walled, which may be related to chronic outlet obstruction. Correlate with urinalysis to exclude cystitis. 4. Infrarenal abdominal aorta measures up to 2.9 cm in AP dimension just proximal to the level of the bifurcation, similar to prior. Recommend followup by ultrasound in 5 years. This recommendation follows ACR consensus guidelines: White Paper of the ACR Incidental Findings Committee II on Vascular Findings. J Am Coll Radiol 2013; 10:789-794. Aortic aneurysm NOS (ICD10-I71.9) These results were called by telephone at the time of interpretation on 04/17/2020 at 4:18 pm to provider [facility] , who verbally acknowledged these results. Electronically Signed   By: Davina Poke D.O.   On: 04/17/2020 16:18    Procedures Procedures (including critical care time)  Medications Ordered in ED Medications  fentaNYL (SUBLIMAZE) injection 25 mcg (25 mcg Intravenous Refused 04/17/20 1622)  diatrizoate meglumine-sodium (GASTROGRAFIN) 66-10 % solution 90 mL (has no administration in time range)  sodium chloride 0.9 % bolus 1,000 mL (0 mLs Intravenous Stopped  04/17/20 1414)  fentaNYL (SUBLIMAZE) injection 50 mcg (50 mcg Intravenous Given 04/17/20 1230)  metoCLOPramide (REGLAN) injection 10 mg (10 mg Intravenous Given 04/17/20 1503)  iohexol (OMNIPAQUE) 300 MG/ML solution 100 mL (100 mLs Intravenous Contrast Given 04/17/20 1556)    ED Course  I have reviewed the triage vital signs and the nursing notes.  Pertinent labs & imaging results that were available during my care of the patient were reviewed by me and considered in my medical decision making (see chart for details).  Clinical Course as of Apr 18 1739  Tue Apr 17, 2020  1629 Received call from radiology, CT consistent with small bowel obstruction. Will consult medicine for admission.   [MB]  70 Spoke with general surgery, they will follow the patient, requested NG tube and medicine consult for admission.   [MB]  52 Consulted hospitalist, they will admit the patient for further work-up/treatment.  NG tube ordered for nursing.  On serial abdominal reexamination, patient notes pain is stable at this time.  Denied additional pain medication.  No new episodes of vomiting in the ER.   [MB]    Clinical Course User Index [MB] Lyndel Safe   MDM Rules/Calculators/A&P                      69 year old male with left-sided abdominal pain since Thursday. On presentation to the ER, patient is alert and oriented, nontoxic-appearing, in no acute distress, though visibly uncomfortable.  Vitals overall reassuring.  Physical exam positive for left upper and left lower quadrant tenderness.  Patient does have a history of SBO/hernias.  CMP without significant electrode abnormalities, mildly increased creatinine likely secondary to dehydration.  Total bilirubin slightly increased at 1.6, liver enzymes normal.  CBC without leukocytosis.  Normal lipase.  Concern for SBO/incarcerated hernia/diverticulitis.  Will order CT abdomen to further evaluate.  See clinical course for further MDM.  Patient CT  consistent with small bowel obstruction.  Consulted hospitalist service for admission, general surgery will follow.  Of note, CT scan did comment on bladder thickening.  UA with evidence of moderate leukocytosis, but rare bacteria.  Urine culture pending.  I will defer to hospitalist  for further treatment of UTI if culture positive.   At this stage in the ED course the patient is stable for admission. Patient was seen and evaluated by Dr. Maryan Rued and she is agreeable to the above plan.  Final Clinical Impression(s) / ED Diagnoses Final diagnoses:  Encounter for imaging study to confirm nasogastric (NG) tube placement  Small bowel obstruction [facility])    Rx / DC Orders ED Discharge Orders    None           Lyndel Safe 04/17/20 [place], MD 04/18/20 [phone]

## 2020-04-18 ENCOUNTER — Inpatient Hospital Stay (HOSPITAL_COMMUNITY): Payer: Medicare Other

## 2020-04-18 LAB — CBC
HCT: 39.7 % (ref 39.0–52.0)
Hemoglobin: 13.3 g/dL (ref 13.0–17.0)
MCH: 32.3 pg (ref 26.0–34.0)
MCHC: 33.5 g/dL (ref 30.0–36.0)
MCV: 96.4 fL (ref 80.0–100.0)
Platelets: 229 10*3/uL (ref 150–400)
RBC: 4.12 MIL/uL — ABNORMAL LOW (ref 4.22–5.81)
RDW: 13 % (ref 11.5–15.5)
WBC: 9.8 10*3/uL (ref 4.0–10.5)
nRBC: 0 % (ref 0.0–0.2)

## 2020-04-18 LAB — BASIC METABOLIC PANEL
Anion gap: 12 (ref 5–15)
BUN: 16 mg/dL (ref 8–23)
CO2: 23 mmol/L (ref 22–32)
Calcium: 9 mg/dL (ref 8.9–10.3)
Chloride: 105 mmol/L (ref 98–111)
Creatinine, Ser: 1.1 mg/dL (ref 0.61–1.24)
GFR calc Af Amer: >60 mL/min (ref >60)
GFR calc non Af Amer: >60 mL/min (ref >60)
Glucose, Bld: 77 mg/dL (ref 70–99)
Potassium: 4.2 mmol/L (ref 3.5–5.1)
Sodium: 140 mmol/L (ref 135–145)

## 2020-04-18 LAB — URINE CULTURE: Culture: 80000 — AB

## 2020-04-18 LAB — HIV ANTIBODY (ROUTINE TESTING W REFLEX): HIV Screen 4th Generation wRfx: NONREACTIVE

## 2020-04-18 LAB — GLUCOSE, CAPILLARY: Glucose-Capillary: 80 mg/dL (ref 70–99)

## 2020-04-18 MED ORDER — DEXTROSE-NACL 5-0.9 % IV SOLN: INTRAVENOUS | Status: DC

## 2020-04-18 MED ORDER — DIATRIZOATE MEGLUMINE & SODIUM 66-10 % PO SOLN
90.0000 mL | Freq: Once | ORAL | Status: AC
  Administered 2020-04-18: 90 mL via ORAL
  Filled 2020-04-18: qty 90

## 2020-04-18 NOTE — Consult Note (Signed)
[facility] Surgery Consult Note  Shane Clark 07-30-1951  [id number].    Requesting MD: Marylu Lund Chief Complaint: abdominal pain, nausea and vomiting since 04/12/20 Reason for Consult: SBO  HPI:  Patient is a 69 year old male who presents with abdominal pain, nausea and vomiting since 04/12/2020.  Patient has a history of prior SBO's with surgeries by Dr. Ninfa Linden in 2003 and Dr. Donne Hazel 2012.  His last hospitalization was 3/16 - 02/17/18.  His last hospitalization was treated with medical management and symptoms resolved with bowel rest without NG tube.  Patient reports symptoms are like his previous SBO's.  He started with nausea on 5/13 after eating.  He developed nausea and vomiting on 5/14.  He was unable to keep any food down from 5/15-5/18.  He has had ongoing flatus and + BM's while this abdominal pain, nausea and vomiting persist.  This is just like his prior episodes.  Additional problems includes hypertension, GERD, COPD, chronic back pain, BPH.  Work-up in the ED shows he is afebrile, vital signs are stable.  Labs shows a creatinine of 1.26, glucose of 117, total bilirubin 1.6, WBC 9.6, hemoglobin 14.8, hematocrit 43.7, platelets 268,000.  Covid is negative. UA shows 21-50 white cells per high-powered field.  CT scans shows a small bowel obstruction transition point within the central abdomen just inferior to the level of the umbilicus possibly secondary to adhesions, moderate prostamegaly, urinary bladder is decompressed but appears diffusely thickened may be related to chronic outlet obstruction.  Patient has refused NG tube but agreed to take Gastrografin orally.  Gastrografin recorded at 0 6:55 AM this morning.   ROS: Review of Systems  Constitutional: Negative.   HENT: Negative.   Eyes: Negative.   Respiratory: Positive for shortness of breath (with nausea and vomiting).        Thinks he may have sleep apnea, but never tested  Cardiovascular: Negative.    Gastrointestinal: Positive for abdominal pain, heartburn, nausea and vomiting. Negative for blood in stool, constipation, diarrhea and melena.  Genitourinary: Negative.   Musculoskeletal: Positive for back pain.  Skin: Negative.   Neurological: Negative.   Endo/Heme/Allergies: Negative.   Psychiatric/Behavioral: Negative.     Family History  Problem Relation Age of Onset  . Coronary artery disease Father   . Pancreatic cancer Brother   . Stroke Brother   . Heart disease Brother   . Dementia Mother   . Diabetes type II Other   . Colon cancer Neg Hx   . Colon polyps Neg Hx   . Rectal cancer Neg Hx   . Stomach cancer Neg Hx     Past Medical History:  Diagnosis Date  . Allergy   . Arthritis   . Asthma   . BPH (benign prostatic hyperplasia)   . Chronic back pain    "all over"  . COPD (chronic obstructive pulmonary disease) ([place])   . GERD (gastroesophageal reflux disease)   . Hx of adenomatous colonic polyps 03/22/2016  . Hypertension   . Influenza 11/20/2011  . Sepsis ([place]) 02/05/2016    Past Surgical History:  Procedure Laterality Date  . ABDOMINAL HERNIA REPAIR    . ANTERIOR CERVICAL DECOMP/DISCECTOMY FUSION     C6-8  . APPENDECTOMY  1960s  . BACK SURGERY    . COLONOSCOPY    . HERNIA REPAIR    . INSERTION DISTRACTION DEVICE LUMBAR POSTERIOR SPINOUS PROCESS    . KNEE ARTHROSCOPY Left   . SHOULDER SURGERY Left 02/11/2017  . SMALL BOWEL  REPAIR      Social History:  reports that he has quit smoking. His smoking use included cigarettes. He has a 2.40 pack-year smoking history. He has quit using smokeless tobacco. He reports that he does not drink alcohol or use drugs.  Allergies:  Allergies  Allergen Reactions  . Oxycodone-Acetaminophen Anaphylaxis  . Advair Diskus [Fluticasone-Salmeterol]     unknown  . Pentazocine Lactate Nausea Only and Other (See Comments)    Stopped breathing "talwin"    Medications Prior to Admission  Medication Sig Dispense Refill  .  albuterol (PROVENTIL HFA;VENTOLIN HFA) 108 (90 BASE) MCG/ACT inhaler Inhale 2 puffs into the lungs every 6 (six) hours as needed for wheezing or shortness of breath.     Marland Kitchen azelastine (ASTELIN) 0.1 % nasal spray Place 2 sprays into both nostrils 2 (two) times daily as needed for congestion.    . budesonide-formoterol (SYMBICORT) 80-4.5 MCG/ACT inhaler Inhale 2 puffs into the lungs 2 (two) times daily.    Marland Kitchen loratadine (CLARITIN) 10 MG tablet Take 10 mg by mouth daily as needed for allergies.     . pantoprazole (PROTONIX) 40 MG tablet Take 40 mg by mouth daily.      . Tamsulosin HCl (FLOMAX) 0.4 MG CAPS Take 0.4 mg by mouth daily.      Marland Kitchen telmisartan (MICARDIS) 40 MG tablet Take 40 mg by mouth daily.  3  . HYDROcodone-acetaminophen (NORCO) 7.5-325 MG tablet Take 1-2 tablets by mouth every 6 (six) hours as needed for moderate pain. (Patient not taking: Reported on 04/17/2020) 90 tablet 0  . methocarbamol (ROBAXIN) 750 MG tablet Take 1 tablet (750 mg total) by mouth 2 (two) times daily as needed for muscle spasms. (Patient not taking: Reported on 04/17/2020) 60 tablet 0  . ondansetron (ZOFRAN) 4 MG tablet Take 1-2 tablets (4-8 mg total) by mouth every 8 (eight) hours as needed for nausea or vomiting. (Patient not taking: Reported on 04/17/2020) 40 tablet 0  . promethazine (PHENERGAN) 25 MG suppository Place 1 suppository (25 mg total) rectally every 6 (six) hours as needed for nausea or vomiting. (Patient not taking: Reported on 04/17/2020) 12 each 0  . promethazine (PHENERGAN) 25 MG tablet Take 1 tablet (25 mg total) by mouth every 6 (six) hours as needed for nausea. (Patient not taking: Reported on 04/17/2020) 30 tablet 1    Blood pressure 98/65, pulse 79, temperature 98.1 F (36.7 C), temperature source Oral, resp. rate 18, height 5\' 6"  (1.676 m), weight 122.5 kg, SpO2 95 %. Physical Exam:  General: pleasant, WD, WN overweight white male who is laying in bed in NAD HEENT: head is normocephalic, atraumatic.   Sclera are noninjected. Pupils are equal  ears and nose without any masses or lesions.  Mouth is pink and moist Heart: regular, rate, and rhythm.   No obvious murmurs, gallops, or rubs noted.  Palpable radial and pedal pulses bilaterally Lungs: CTAB, no wheezes, rhonchi, or rales noted.  Respiratory effort nonlabored Abd: soft, some distention, +BS high-pitched, no masses, hernias, or organomegaly, ongoing bowel movements he needed to have a bowel movement while he was doing his exam. Midline surgical scar well-healed. MS: all 4 extremities are symmetrical with no cyanosis, clubbing, or edema. Skin: warm and dry with no masses, lesions, or rashes Neuro: Cranial nerves 2-12 grossly intact, sensation is normal throughout Psych: A&Ox3 with an appropriate affect.   Results for orders placed or performed during the hospital encounter of 04/17/20 (from the past 48 hour(s))  Lipase, blood  Status: None   Collection Time: 04/17/20  8:22 AM  Result Value Ref Range   Lipase 29 11 - 51 U/L    Comment: Performed at [facility], [place] [address] [place], [place] [address]  Comprehensive metabolic panel     Status: Abnormal   Collection Time: 04/17/20  8:22 AM  Result Value Ref Range   Sodium 138 135 - 145 mmol/L   Potassium 4.8 3.5 - 5.1 mmol/L   Chloride 103 98 - 111 mmol/L   CO2 22 22 - 32 mmol/L   Glucose, Bld 117 (H) 70 - 99 mg/dL    Comment: Glucose reference range applies only to samples taken after fasting for at least 8 hours.   BUN 20 8 - 23 mg/dL   Creatinine, Ser 1.26 (H) 0.61 - 1.24 mg/dL   Calcium 9.2 8.9 - 10.3 mg/dL   Total Protein 7.7 6.5 - 8.1 g/dL   Albumin 4.1 3.5 - 5.0 g/dL   AST 34 15 - 41 U/L   ALT 35 0 - 44 U/L   Alkaline Phosphatase 72 38 - 126 U/L   Total Bilirubin 1.6 (H) 0.3 - 1.2 mg/dL   GFR calc non Af Amer 58 (L) >60 mL/min   GFR calc Af Amer >60 >60 mL/min   Anion gap 13 5 - 15    Comment: Performed at [place] [address]  [place] [address]  CBC     Status: None   Collection Time: 04/17/20  8:22 AM  Result Value Ref Range   WBC 9.6 4.0 - 10.5 K/uL   RBC 4.53 4.22 - 5.81 MIL/uL   Hemoglobin 14.8 13.0 - 17.0 g/dL   HCT 43.7 39.0 - 52.0 %   MCV 96.5 80.0 - 100.0 fL   MCH 32.7 26.0 - 34.0 pg   MCHC 33.9 30.0 - 36.0 g/dL   RDW 13.0 11.5 - 15.5 %   Platelets 268 150 - 400 K/uL   nRBC 0.0 0.0 - 0.2 %    Comment: Performed at [facility], [place] [address] [place], [place] [address]  Urinalysis, Routine w reflex microscopic     Status: Abnormal   Collection Time: 04/17/20  4:30 PM  Result Value Ref Range   Color, Urine YELLOW YELLOW   APPearance HAZY (A) CLEAR   Specific Gravity, Urine 1.025 1.005 - 1.030   pH 5.0 5.0 - 8.0   Glucose, UA NEGATIVE NEGATIVE mg/dL   Hgb urine dipstick NEGATIVE NEGATIVE   Bilirubin Urine NEGATIVE NEGATIVE   Ketones, ur 20 (A) NEGATIVE mg/dL   Protein, ur NEGATIVE NEGATIVE mg/dL   Nitrite NEGATIVE NEGATIVE   Leukocytes,Ua MODERATE (A) NEGATIVE   RBC / HPF 0-5 0 - 5 RBC/hpf   WBC, UA 21-50 0 - 5 WBC/hpf   Bacteria, UA RARE (A) NONE SEEN   Squamous Epithelial / LPF 0-5 0 - 5   Mucus PRESENT     Comment: Performed at [facility], [place] [address] [place], [place] [address]  SARS Coronavirus 2 by RT PCR (hospital order, performed in [facility] hospital lab) Nasopharyngeal Nasopharyngeal Swab     Status: None   Collection Time: 04/17/20  4:45 PM   Specimen: Nasopharyngeal Swab  Result Value Ref Range   SARS Coronavirus 2 NEGATIVE NEGATIVE    Comment: (NOTE) SARS-CoV-2 target nucleic acids are NOT DETECTED. The SARS-CoV-2 RNA is generally detectable in upper and lower respiratory specimens during the acute  phase of infection. The lowest concentration of SARS-CoV-2 viral copies this assay can detect is 250 copies / mL. A negative result does not preclude SARS-CoV-2 infection and should not be used as the sole basis for treatment or other patient management  decisions.  A negative result may occur with improper specimen collection / handling, submission of specimen other than nasopharyngeal swab, presence of viral mutation(s) within the areas targeted by this assay, and inadequate number of viral copies (<250 copies / mL). A negative result must be combined with clinical observations, patient history, and epidemiological information. Fact Sheet for Patients:   StrictlyIdeas.no Fact Sheet for Healthcare Providers: BankingDealers.co.za This test is not yet approved or cleared  by the [place] FDA and has been authorized for detection and/or diagnosis of SARS-CoV-2 by FDA under an Emergency Use Authorization (EUA).  This EUA will remain in effect (meaning this test can be used) for the duration of the COVID-19 declaration under Section 564(b)(1) of the Act, 21 U.S.C. section 360bbb-3(b)(1), unless the authorization is terminated or revoked sooner. Performed at [facility], [place] [address] [place], [place] [id number]   Basic metabolic panel     Status: None   Collection Time: 04/18/20  2:24 AM  Result Value Ref Range   Sodium 140 135 - 145 mmol/L   Potassium 4.2 3.5 - 5.1 mmol/L   Chloride 105 98 - 111 mmol/L   CO2 23 22 - 32 mmol/L   Glucose, Bld 77 70 - 99 mg/dL    Comment: Glucose reference range applies only to samples taken after fasting for at least 8 hours.   BUN 16 8 - 23 mg/dL   Creatinine, Ser 1.10 0.61 - 1.24 mg/dL   Calcium 9.0 8.9 - 10.3 mg/dL   GFR calc non Af Amer >60 >60 mL/min   GFR calc Af Amer >60 >60 mL/min   Anion gap 12 5 - 15    Comment: Performed at [place] [address], [place], [place] [address]  CBC     Status: Abnormal   Collection Time: 04/18/20  2:24 AM  Result Value Ref Range   WBC 9.8 4.0 - 10.5 K/uL   RBC 4.12 (L) 4.22 - 5.81 MIL/uL   Hemoglobin 13.3 13.0 - 17.0 g/dL   HCT 39.7 39.0 - 52.0 %   MCV 96.4 80.0 - 100.0 fL   MCH 32.3  26.0 - 34.0 pg   MCHC 33.5 30.0 - 36.0 g/dL   RDW 13.0 11.5 - 15.5 %   Platelets 229 150 - 400 K/uL   nRBC 0.0 0.0 - 0.2 %    Comment: Performed at [facility], [place] [address] [place],  [address]   CT ABDOMEN PELVIS W CONTRAST  Result Date: 04/17/2020 CLINICAL DATA:  Abdominal pain. History of small-bowel obstruction EXAM: CT ABDOMEN AND PELVIS WITH CONTRAST TECHNIQUE: Multidetector CT imaging of the abdomen and pelvis was performed using the standard protocol following bolus administration of intravenous contrast. CONTRAST:  173mL OMNIPAQUE IOHEXOL 300 MG/ML  SOLN COMPARISON:  CT 02/13/2018 FINDINGS: Lower chest: Streaky bibasilar opacities favored atelectasis. Bilateral gynecomastia. Hepatobiliary: Tiny low-density lesion at the right hepatic dome, too small to definitively characterize, but most likely represents cyst or hemangioma. Liver appears otherwise unremarkable. Mild distension of the gallbladder without hyperdense gallstone or surrounding inflammatory changes. No biliary dilatation. Pancreas: Unremarkable. No pancreatic ductal dilatation or surrounding inflammatory changes. Spleen: Normal in size without focal abnormality. Adrenals/Urinary Tract: Unremarkable adrenal glands.  Stable upper pole right renal cyst. Kidneys appear otherwise unremarkable with symmetric enhancement. No renal stone or hydronephrosis. Urinary bladder is decompressed but appears diffusely thick walled. Stomach/Bowel: Stomach appears unremarkable. There are multiple dilated loops of small bowel within the mid abdomen with air-fluid levels measuring up to 5.4 cm in diameter. Transition point to relatively collapsed small bowel is noted within the central abdomen just inferior to the level of the umbilicus (series 2, image 72; series 5, images 44-51). The large bowel is largely decompressed. No focal bowel wall thickening or inflammatory changes. Vascular/Lymphatic: Scattered aortic atherosclerosis. Infrarenal  abdominal aorta measures up to 2.9 cm in AP dimension just proximal to the level of the bifurcation, similar to prior. No abdominopelvic lymphadenopathy. Reproductive: Moderate prostatomegaly. Other: No free fluid. No organized fluid collection. No pneumoperitoneum. Musculoskeletal: Degenerative changes without new or acute osseous findings. IMPRESSION: 1. Small-bowel obstruction with transition point within the central abdomen just inferior to the level of the umbilicus, possibly secondary to adhesions. 2. Moderate prostatomegaly. 3. Urinary bladder is decompressed but appears diffusely thick walled, which may be related to chronic outlet obstruction. Correlate with urinalysis to exclude cystitis. 4. Infrarenal abdominal aorta measures up to 2.9 cm in AP dimension just proximal to the level of the bifurcation, similar to prior. Recommend followup by ultrasound in 5 years. This recommendation follows ACR consensus guidelines: White Paper of the ACR Incidental Findings Committee II on Vascular Findings. J Am Coll Radiol 2013; 10:789-794. Aortic aneurysm NOS (ICD10-I71.9) These results were called by telephone at the time of interpretation on 04/17/2020 at 4:18 pm to provider [facility] , who verbally acknowledged these results. Electronically Signed   By: Davina Poke D.O.   On: 04/17/2020 16:18   . sodium chloride 100 mL/hr at 04/18/20 0747      Assessment/Plan Hx hypertension Hx COPD/asthma Hx tobacco use 30 pack year hx, quit 30 years ago Hx chronic back pain Hx GERD Hx of adenomatous colon polyps BPH BMI 43.5  Recurrent SBO S/p exploratory laparotomy lysis of adhesions 12/15/2010, Dr. Donne Hazel S/p oratory laparotomy repair of umbilical hernia repair [id number] Dr. Ninfa Linden Hx appendectomy  FEN: IV fluids/n.p.o. ID: None DVT: Lovenox Follow-up: TBD  Plan: Patient appears comfortable right now, abdomen mildly distended but he is not having any nausea or vomiting, no significant abdominal  discomfort currently. He received the Gastrografin this a.m. for the small bowel protocol. He refused NG because of history of septal defect. Agree with bowel rest, IV fluid rehydration; we will follow with you.    Earnstine Regal [facility] Surgery 04/18/2020, 7:07 AM Please see Amion for pager number during day hours 7:00am-4:30pm

## 2020-04-18 NOTE — Progress Notes (Signed)
PROGRESS NOTE    Shane Clark  [id number] DOB: 1951/05/16 DOA: 04/17/2020 PCP: Shane Arabian, MD    Brief Narrative:  69 y.o. male with medical history significant of HTN, GERD, COPD, chronic back pain, BPH, Allergies who presents for nausea, vomiting and abdominal pain.  [name] reports that he ate supper normally on last Thursday.  After the meal he developed cramping.  On Friday he threw up a few times, but he was passing gas and having bowel movements so he put himself on bowel rest.  He was better in the next day or 2.  He was able to eat an egg and half a bowl of grits on Monday and this stayed down.  However, since that time he has been having abdominal pain, worse on the left, distention and vomiting no matter what he tried to take in.  The curious thing, he continues to pas flatus and have bowel movements.  He reports that this has happened every time he has a small bowel obstruction.  He would like to be placed on bowel rest, but if he needs surgery he would prefer Dr. Donne Clark.  He has had an SBO a few times, and they tend to come on every 2 years or so.  He has been SOB when the pain gets bad.  He refuses an NGT due to previous perforated septum related to one.  He feels that bowel rest alone would be find.  He has had at least 3 abdominal surgeries in the past.  He has had an SBO due to a mesh issue in the past as well.  Prior to Thursday, he was in his usual state of health and denies other symptoms.   ED Course: In the ED, he had a mildly elevated Cr of 1.26, though this does seem to be in line with previous numbers.  He had an elevated T bili of 1.6.   Urine showed low ketones.  CT abdomen showed an SBO.  Surgery was consulted as well.  Assessment & Plan:   Active Problems:   COPD (chronic obstructive pulmonary disease) (HCC)   Diastolic dysfunction   BPH (benign prostatic hyperplasia)   Essential hypertension   SBO (small bowel obstruction) (HCC)  SBO (small bowel  obstruction) Nausea, vomiting - elevated T bili, likely in the setting of vomiting - Patient had refused NGT given history of septal defect - Gen Surg consulted, recommendation for bowel rest, IVF, Gastrografin study - Continue analgesia as needed - Nausea control with Zofran - Currently NPO -Have advised pt to ambulate as tolerated. Pt reports multiple BM this AM    COPD (chronic obstructive pulmonary disease) - Continue home symbicort and albuterol as tolerated    Chronic Diastolic dysfunction without exacerbation - Last TTE from 2016 with mild dysfunction, on no diuretics - Monitor closely for volume overload - Chronic    BPH (benign prostatic hyperplasia) - Plan to resume tamsulosin when able to take PO    Essential hypertension - Holding telmisartan while NPO - IV metoprolol for very high blood pressures only -BP currently soft. Cont to monitor  DVT prophylaxis: Lovenox subq Code Status: Full Family Communication: Pt in room, family at bedside  Status is: Inpatient  Remains inpatient appropriate because:Ongoing diagnostic testing needed not appropriate for outpatient work up and IV treatments appropriate due to intensity of illness or inability to take PO   Dispo: The patient is from: Home  Anticipated d/c is to: Home              Anticipated d/c date is: 2 days              Patient currently is not medically stable to d/c.  Consultants:   General Surgery  Procedures:     Antimicrobials: Anti-infectives (From admission, onward)   None       Subjective: Reports multiple BM and flatus this AM  Objective: Vitals:   04/18/20 0150 04/18/20 0605 04/18/20 1403 04/18/20 1406  BP: (!) 99/59 98/65  92/64  Pulse: 74 79  79  Resp: 16 18 18 18   Temp: 98.4 F (36.9 C) 98.1 F (36.7 C)  (!) 97.5 F (36.4 C)  TempSrc: Oral Oral  Oral  SpO2: 97% 95%  97%  Weight:      Height:        Intake/Output Summary (Last 24 hours) at 04/18/2020  1534 Last data filed at 04/18/2020 1500 Gross per 24 hour  Intake 999.46 ml  Output -  Net 999.46 ml   Filed Weights   04/17/20 0813 04/17/20 2141  Weight: 122.5 kg 122.5 kg    Examination:  General exam: Appears calm and comfortable  Respiratory system: Clear to auscultation. Respiratory effort normal. Cardiovascular system: S1 & S2 heard, Regular Gastrointestinal system: decreased BS, nontender Central nervous system: Alert and oriented. No focal neurological deficits. Extremities: Symmetric 5 x 5 power. Skin: No rashes, lesions Psychiatry: Judgement and insight appear normal. Mood & affect appropriate.   Data Reviewed: I have personally reviewed following labs and imaging studies  CBC: Recent Labs  Lab 04/17/20 0822 04/18/20 0224  WBC 9.6 9.8  HGB 14.8 13.3  HCT 43.7 39.7  MCV 96.5 96.4  PLT 268 [id number]   Basic Metabolic Panel: Recent Labs  Lab 04/17/20 0822 04/18/20 0224  NA 138 140  K 4.8 4.2  CL 103 105  CO2 22 23  GLUCOSE 117* 77  BUN 20 16  CREATININE 1.26* 1.10  CALCIUM 9.2 9.0   GFR: Estimated Creatinine Clearance: 79.4 mL/min (by C-G formula based on SCr of 1.1 mg/dL). Liver Function Tests: Recent Labs  Lab 04/17/20 0822  AST 34  ALT 35  ALKPHOS 72  BILITOT 1.6*  PROT 7.7  ALBUMIN 4.1   Recent Labs  Lab 04/17/20 0822  LIPASE 29   No results for input(s): AMMONIA in the last 168 hours. Coagulation Profile: No results for input(s): INR, PROTIME in the last 168 hours. Cardiac Enzymes: No results for input(s): CKTOTAL, CKMB, CKMBINDEX, TROPONINI in the last 168 hours. BNP (last 3 results) No results for input(s): PROBNP in the last 8760 hours. HbA1C: No results for input(s): HGBA1C in the last 72 hours. CBG: Recent Labs  Lab 04/18/20 0808  GLUCAP 80   Lipid Profile: No results for input(s): CHOL, HDL, LDLCALC, TRIG, CHOLHDL, LDLDIRECT in the last 72 hours. Thyroid Function Tests: No results for input(s): TSH, T4TOTAL, FREET4,  T3FREE, THYROIDAB in the last 72 hours. Anemia Panel: No results for input(s): VITAMINB12, FOLATE, FERRITIN, TIBC, IRON, RETICCTPCT in the last 72 hours. Sepsis Labs: No results for input(s): PROCALCITON, LATICACIDVEN in the last 168 hours.  Recent Results (from the past 240 hour(s))  SARS Coronavirus 2 by RT PCR (hospital order, performed in [facility] hospital lab) Nasopharyngeal Nasopharyngeal Swab     Status: None   Collection Time: 04/17/20  4:45 PM   Specimen: Nasopharyngeal Swab  Result Value Ref Range Status  SARS Coronavirus 2 NEGATIVE NEGATIVE Final    Comment: (NOTE) SARS-CoV-2 target nucleic acids are NOT DETECTED. The SARS-CoV-2 RNA is generally detectable in upper and lower respiratory specimens during the acute phase of infection. The lowest concentration of SARS-CoV-2 viral copies this assay can detect is 250 copies / mL. A negative result does not preclude SARS-CoV-2 infection and should not be used as the sole basis for treatment or other patient management decisions.  A negative result may occur with improper specimen collection / handling, submission of specimen other than nasopharyngeal swab, presence of viral mutation(s) within the areas targeted by this assay, and inadequate number of viral copies (<250 copies / mL). A negative result must be combined with clinical observations, patient history, and epidemiological information. Fact Sheet for Patients:   StrictlyIdeas.no Fact Sheet for Healthcare Providers: BankingDealers.co.za This test is not yet approved or cleared  by the [place] FDA and has been authorized for detection and/or diagnosis of SARS-CoV-2 by FDA under an Emergency Use Authorization (EUA).  This EUA will remain in effect (meaning this test can be used) for the duration of the COVID-19 declaration under Section 564(b)(1) of the Act, 21 U.S.C. section 360bbb-3(b)(1), unless the authorization  is terminated or revoked sooner. Performed at [facility], [place] [address], [place], [place] [address]   Urine culture     Status: Abnormal   Collection Time: 04/17/20  7:10 PM   Specimen: Urine, Random  Result Value Ref Range Status   Specimen Description URINE, RANDOM  Final   Special Requests NONE  Final   Culture (A)  Final    80,000 COLONIES/mL STREPTOCOCCUS GROUP G Beta hemolytic streptococci are predictably susceptible to penicillin and other beta lactams. Susceptibility testing not routinely performed. Performed at [facility], [place] [address], [place], [place] [address]    Report Status 04/18/2020 FINAL  Final     Radiology Studies: CT ABDOMEN PELVIS W CONTRAST  Result Date: 04/17/2020 CLINICAL DATA:  Abdominal pain. History of small-bowel obstruction EXAM: CT ABDOMEN AND PELVIS WITH CONTRAST TECHNIQUE: Multidetector CT imaging of the abdomen and pelvis was performed using the standard protocol following bolus administration of intravenous contrast. CONTRAST:  174mL OMNIPAQUE IOHEXOL 300 MG/ML  SOLN COMPARISON:  CT 02/13/2018 FINDINGS: Lower chest: Streaky bibasilar opacities favored atelectasis. Bilateral gynecomastia. Hepatobiliary: Tiny low-density lesion at the right hepatic dome, too small to definitively characterize, but most likely represents cyst or hemangioma. Liver appears otherwise unremarkable. Mild distension of the gallbladder without hyperdense gallstone or surrounding inflammatory changes. No biliary dilatation. Pancreas: Unremarkable. No pancreatic ductal dilatation or surrounding inflammatory changes. Spleen: Normal in size without focal abnormality. Adrenals/Urinary Tract: Unremarkable adrenal glands. Stable upper pole right renal cyst. Kidneys appear otherwise unremarkable with symmetric enhancement. No renal stone or hydronephrosis. Urinary bladder is decompressed but appears diffusely thick walled. Stomach/Bowel: Stomach appears unremarkable. There  are multiple dilated loops of small bowel within the mid abdomen with air-fluid levels measuring up to 5.4 cm in diameter. Transition point to relatively collapsed small bowel is noted within the central abdomen just inferior to the level of the umbilicus (series 2, image 72; series 5, images 44-51). The large bowel is largely decompressed. No focal bowel wall thickening or inflammatory changes. Vascular/Lymphatic: Scattered aortic atherosclerosis. Infrarenal abdominal aorta measures up to 2.9 cm in AP dimension just proximal to the level of the bifurcation, similar to prior. No abdominopelvic lymphadenopathy. Reproductive: Moderate prostatomegaly. Other: No free fluid. No organized fluid collection. No pneumoperitoneum.  Musculoskeletal: Degenerative changes without new or acute osseous findings. IMPRESSION: 1. Small-bowel obstruction with transition point within the central abdomen just inferior to the level of the umbilicus, possibly secondary to adhesions. 2. Moderate prostatomegaly. 3. Urinary bladder is decompressed but appears diffusely thick walled, which may be related to chronic outlet obstruction. Correlate with urinalysis to exclude cystitis. 4. Infrarenal abdominal aorta measures up to 2.9 cm in AP dimension just proximal to the level of the bifurcation, similar to prior. Recommend followup by ultrasound in 5 years. This recommendation follows ACR consensus guidelines: White Paper of the ACR Incidental Findings Committee II on Vascular Findings. J Am Coll Radiol 2013; 10:789-794. Aortic aneurysm NOS (ICD10-I71.9) These results were called by telephone at the time of interpretation on 04/17/2020 at 4:18 pm to provider [facility] , who verbally acknowledged these results. Electronically Signed   By: Davina Poke D.O.   On: 04/17/2020 16:18   DG Abd Portable 1V-Small Bowel Obstruction Protocol-initial, 8 hr delay  Result Date: 04/18/2020 CLINICAL DATA:  Small-bowel obstruction. EXAM: PORTABLE  ABDOMEN - 1 VIEW COMPARISON:  CT 04/17/2020 FINDINGS: Moderate amount of gas within the small intestine and also within the colon. Previously administered oral contrast has passed into the colon and progressed as far as the rectum. No abnormal calcifications or bone findings. IMPRESSION: Patient continues to show some prominence of small-bowel loops which are gas containing. However, previously administered oral contrast has passed into the colon, indicating that if there is ongoing small bowel obstruction it is only partial. Electronically Signed   By: Nelson Chimes M.D.   On: 04/18/2020 15:09    Scheduled Meds: . enoxaparin (LOVENOX) injection  40 mg Subcutaneous Q2000  . fentaNYL (SUBLIMAZE) injection  25 mcg Intravenous Once  . mometasone-formoterol  2 puff Inhalation BID  . pantoprazole (PROTONIX) IV  40 mg Intravenous Q24H  . sodium chloride flush  3 mL Intravenous Q12H   Continuous Infusions: . dextrose 5 % and 0.9% NaCl 75 mL/hr at 04/18/20 1027     LOS: 1 day   Marylu Lund, MD [facility] Pager On Amion  If 7PM-7AM, please contact night-coverage 04/18/2020, 3:34 PM

## 2020-04-18 NOTE — Progress Notes (Addendum)
Patient arrived from the ED with Gastrografin protocols. Patient refusing NG tube placement d/t prior experience with NG tube insertions. Patient agreed to take Gastrografin orally.

## 2020-04-18 NOTE — Plan of Care (Signed)

## 2020-04-19 DIAGNOSIS — Z0189 Encounter for other specified special examinations: Secondary | ICD-10-CM

## 2020-04-19 LAB — CBC
HCT: 38.3 % — ABNORMAL LOW (ref 39.0–52.0)
Hemoglobin: 12.9 g/dL — ABNORMAL LOW (ref 13.0–17.0)
MCH: 32.6 pg (ref 26.0–34.0)
MCHC: 33.7 g/dL (ref 30.0–36.0)
MCV: 96.7 fL (ref 80.0–100.0)
Platelets: 217 10*3/uL (ref 150–400)
RBC: 3.96 MIL/uL — ABNORMAL LOW (ref 4.22–5.81)
RDW: 13.1 % (ref 11.5–15.5)
WBC: 8.7 10*3/uL (ref 4.0–10.5)
nRBC: 0 % (ref 0.0–0.2)

## 2020-04-19 LAB — COMPREHENSIVE METABOLIC PANEL
ALT: 22 U/L (ref 0–44)
AST: 21 U/L (ref 15–41)
Albumin: 3.5 g/dL (ref 3.5–5.0)
Alkaline Phosphatase: 56 U/L (ref 38–126)
Anion gap: 8 (ref 5–15)
BUN: 17 mg/dL (ref 8–23)
CO2: 23 mmol/L (ref 22–32)
Calcium: 8.3 mg/dL — ABNORMAL LOW (ref 8.9–10.3)
Chloride: 108 mmol/L (ref 98–111)
Creatinine, Ser: 1.09 mg/dL (ref 0.61–1.24)
GFR calc Af Amer: >60 mL/min (ref >60)
GFR calc non Af Amer: >60 mL/min (ref >60)
Glucose, Bld: 96 mg/dL (ref 70–99)
Potassium: 4 mmol/L (ref 3.5–5.1)
Sodium: 139 mmol/L (ref 135–145)
Total Bilirubin: 1 mg/dL (ref 0.3–1.2)
Total Protein: 6.6 g/dL (ref 6.5–8.1)

## 2020-04-19 LAB — GLUCOSE, CAPILLARY: Glucose-Capillary: 89 mg/dL (ref 70–99)

## 2020-04-19 NOTE — Plan of Care (Signed)
  Problem: Health Behavior/Discharge Planning: Goal: Ability to manage health-related needs will improve Outcome: Adequate for Discharge   Problem: Clinical Measurements: Goal: Ability to maintain clinical measurements within normal limits will improve Outcome: Adequate for Discharge Goal: Will remain free from infection Outcome: Adequate for Discharge Goal: Diagnostic test results will improve Outcome: Adequate for Discharge   Problem: Nutrition: Goal: Adequate nutrition will be maintained Outcome: Adequate for Discharge   Problem: Coping: Goal: Level of anxiety will decrease Outcome: Adequate for Discharge   Problem: Elimination: Goal: Will not experience complications related to bowel motility Outcome: Adequate for Discharge Goal: Will not experience complications related to urinary retention Outcome: Adequate for Discharge   Problem: Pain Managment: Goal: General experience of comfort will improve Outcome: Adequate for Discharge   Problem: Safety: Goal: Ability to remain free from injury will improve Outcome: Adequate for Discharge   Problem: Skin Integrity: Goal: Risk for impaired skin integrity will decrease Outcome: Adequate for Discharge

## 2020-04-19 NOTE — Plan of Care (Signed)

## 2020-04-19 NOTE — Progress Notes (Signed)
   04/19/20 1429  Assess: MEWS Score  Level of Consciousness Alert  Assess: MEWS Score  MEWS Temp 0  MEWS Systolic 1  MEWS Pulse 0  MEWS RR 1  MEWS LOC 0  MEWS Score 2  MEWS Score Color Yellow  Assess: if the MEWS score is Yellow or Red  Were vital signs taken at a resting state? Yes  Focused Assessment Documented focused assessment  Early Detection of Sepsis Score *See Row Information* Low  MEWS guidelines implemented *See Row Information* No, vital signs rechecked  Treat  MEWS Interventions Other (Comment)  Notify: Provider  Provider Name/Title Chiu  Date Provider Notified 04/19/20  Time Provider Notified 1431  Notification Type  (text secure)  Notification Reason Other (Comment) (soft BP, patients states that normal )  Response No new orders  Date of Provider Response 04/19/20  Time of Provider Response 1435  Md made aware, patient stable for DC later today

## 2020-04-19 NOTE — Plan of Care (Signed)
  Problem: Education: Goal: Knowledge of General Education information will improve Description: Including pain rating scale, medication(s)/side effects and non-pharmacologic comfort measures Outcome: Completed/Met   Problem: Clinical Measurements: Goal: Respiratory complications will improve Outcome: Completed/Met Goal: Cardiovascular complication will be avoided Outcome: Completed/Met   Problem: Activity: Goal: Risk for activity intolerance will decrease Outcome: Completed/Met

## 2020-04-19 NOTE — Discharge Summary (Signed)
Physician Discharge Summary  Shane Clark [id number] DOB: 06/20/1951 DOA: 04/17/2020  PCP: Gaynelle Arabian, MD  Admit date: 04/17/2020 Discharge date: 04/19/2020  Admitted From: Home Disposition:  Home  Recommendations for Outpatient Follow-up:  1. Follow up with PCP in 2-3 weeks 2. Follow up with General Surgery as scheduled  Discharge Condition:Improved CODE STATUS:Full Diet recommendation: Full liquid diet for next 24-48hrs after discharge, then gradually advance as tolerated   Brief/Interim Summary: 69 y.o.malewith medical history significant ofHTN, GERD, COPD, chronic back pain, BPH, Allergies who presents for nausea, vomiting and abdominal pain. Mr. Satterly reports that he ate supper normally on last Thursday. After the meal he developed cramping. On Friday he threw up a few times, but he was passing gas and having bowel movements so he put himself on bowel rest. He was better in the next day or 2. He was able to eat an egg and half a bowl of grits on Monday and this stayed down. However, since that time he has been having abdominal pain, worse on the left, distention and vomiting no matter what he tried to take in. The curious thing, he continues to pas flatus and have bowel movements. He reports that this has happened every time he has a small bowel obstruction. He would like to be placed on bowel rest, but if he needs surgery he would prefer Dr. Donne Hazel. He has had an SBO a few times, and they tend to come on every 2 years or so. He has been SOB when the pain gets bad. He refuses an NGT due to previous perforated septum related to one. He feels that bowel rest alone would be find. He has had at least 3 abdominal surgeries in the past. He has had an SBO due to a mesh issue in the past as well. Prior to Thursday, he was in his usual state of health and denies other symptoms.   ED Course:In the ED, he had a mildly elevated Cr of 1.26, though this does seem to be in  line with previous numbers. He had an elevated T bili of 1.6. Urine showed low ketones. CT abdomen showed an SBO. Surgery was consulted as well.  Discharge Diagnoses:  Active Problems:   COPD (chronic obstructive pulmonary disease) (HCC)   Diastolic dysfunction   BPH (benign prostatic hyperplasia)   Essential hypertension   SBO (small bowel obstruction) (HCC)  SBO (small bowel obstruction) Nausea, vomiting - elevated T bili, likely in the setting of vomiting - Patient had refused NGT given history of septal defect - Gen Surg consulted, recommendations noted for bowel rest, IVF, Gastrografin study - Continue analgesia as needed - Nausea control with Zofran - Advanced diet to full liquid. Per General Surgery, pt to continue full liquid diet x 24-48hrs and gradually advance as distension improves - Have advised pt to ambulate as tolerated. Pt reports multiple BM this visit  COPD (chronic obstructive pulmonary disease) - Continue home symbicort and albuterol as tolerated  Chronic Diastolic dysfunction without exacerbation - Last TTE from 2016 with mild dysfunction, on no diuretics - Chronic  BPH (benign prostatic hyperplasia) - Resume tamsulosin on d/c  Essential hypertension - Holding telmisartan, will continue to hold given soft BP -Would defer to PCP when appropriate to resume   Discharge Instructions   Allergies as of 04/19/2020      Reactions   Oxycodone-acetaminophen Anaphylaxis   Advair Diskus [fluticasone-salmeterol]    unknown   Pentazocine Lactate Nausea Only, Other (See Comments)  Stopped breathing "talwin"      Medication List    STOP taking these medications   HYDROcodone-acetaminophen 7.5-325 MG tablet Commonly known as: Norco   methocarbamol 750 MG tablet Commonly known as: ROBAXIN   ondansetron 4 MG tablet Commonly known as: ZOFRAN   promethazine 25 MG suppository Commonly known as: Phenergan   promethazine 25 MG tablet Commonly  known as: PHENERGAN   telmisartan 40 MG tablet Commonly known as: MICARDIS     TAKE these medications   albuterol 108 (90 Base) MCG/ACT inhaler Commonly known as: VENTOLIN HFA Inhale 2 puffs into the lungs every 6 (six) hours as needed for wheezing or shortness of breath.   azelastine 0.1 % nasal spray Commonly known as: ASTELIN Place 2 sprays into both nostrils 2 (two) times daily as needed for congestion.   budesonide-formoterol 80-4.5 MCG/ACT inhaler Commonly known as: SYMBICORT Inhale 2 puffs into the lungs 2 (two) times daily.   loratadine 10 MG tablet Commonly known as: CLARITIN Take 10 mg by mouth daily as needed for allergies.   pantoprazole 40 MG tablet Commonly known as: PROTONIX Take 40 mg by mouth daily.   tamsulosin 0.4 MG Caps capsule Commonly known as: FLOMAX Take 0.4 mg by mouth daily.      Follow-up Information    Gaynelle Arabian, MD. Schedule an appointment as soon as possible for a visit in 2 week(s).   Specialty: Family Medicine Contact information: [address] [place] [address] [phone]          Allergies  Allergen Reactions  . Oxycodone-Acetaminophen Anaphylaxis  . Advair Diskus [Fluticasone-Salmeterol]     unknown  . Pentazocine Lactate Nausea Only and Other (See Comments)    Stopped breathing "talwin"    Consultations:  General Surgery  Procedures/Studies: CT ABDOMEN PELVIS W CONTRAST  Result Date: 04/17/2020 CLINICAL DATA:  Abdominal pain. History of small-bowel obstruction EXAM: CT ABDOMEN AND PELVIS WITH CONTRAST TECHNIQUE: Multidetector CT imaging of the abdomen and pelvis was performed using the standard protocol following bolus administration of intravenous contrast. CONTRAST:  19mL OMNIPAQUE IOHEXOL 300 MG/ML  SOLN COMPARISON:  CT 02/13/2018 FINDINGS: Lower chest: Streaky bibasilar opacities favored atelectasis. Bilateral gynecomastia. Hepatobiliary: Tiny low-density lesion at the right hepatic  dome, too small to definitively characterize, but most likely represents cyst or hemangioma. Liver appears otherwise unremarkable. Mild distension of the gallbladder without hyperdense gallstone or surrounding inflammatory changes. No biliary dilatation. Pancreas: Unremarkable. No pancreatic ductal dilatation or surrounding inflammatory changes. Spleen: Normal in size without focal abnormality. Adrenals/Urinary Tract: Unremarkable adrenal glands. Stable upper pole right renal cyst. Kidneys appear otherwise unremarkable with symmetric enhancement. No renal stone or hydronephrosis. Urinary bladder is decompressed but appears diffusely thick walled. Stomach/Bowel: Stomach appears unremarkable. There are multiple dilated loops of small bowel within the mid abdomen with air-fluid levels measuring up to 5.4 cm in diameter. Transition point to relatively collapsed small bowel is noted within the central abdomen just inferior to the level of the umbilicus (series 2, image 72; series 5, images 44-51). The large bowel is largely decompressed. No focal bowel wall thickening or inflammatory changes. Vascular/Lymphatic: Scattered aortic atherosclerosis. Infrarenal abdominal aorta measures up to 2.9 cm in AP dimension just proximal to the level of the bifurcation, similar to prior. No abdominopelvic lymphadenopathy. Reproductive: Moderate prostatomegaly. Other: No free fluid. No organized fluid collection. No pneumoperitoneum. Musculoskeletal: Degenerative changes without new or acute osseous findings. IMPRESSION: 1. Small-bowel obstruction with transition point within the central abdomen just  inferior to the level of the umbilicus, possibly secondary to adhesions. 2. Moderate prostatomegaly. 3. Urinary bladder is decompressed but appears diffusely thick walled, which may be related to chronic outlet obstruction. Correlate with urinalysis to exclude cystitis. 4. Infrarenal abdominal aorta measures up to 2.9 cm in AP dimension  just proximal to the level of the bifurcation, similar to prior. Recommend followup by ultrasound in 5 years. This recommendation follows ACR consensus guidelines: White Paper of the ACR Incidental Findings Committee II on Vascular Findings. J Am Coll Radiol 2013; 10:789-794. Aortic aneurysm NOS (ICD10-I71.9) These results were called by telephone at the time of interpretation on 04/17/2020 at 4:18 pm to provider [facility] , who verbally acknowledged these results. Electronically Signed   By: Davina Poke D.O.   On: 04/17/2020 16:18   DG Abd Portable 1V-Small Bowel Obstruction Protocol-initial, 8 hr delay  Result Date: 04/18/2020 CLINICAL DATA:  Small-bowel obstruction. EXAM: PORTABLE ABDOMEN - 1 VIEW COMPARISON:  CT 04/17/2020 FINDINGS: Moderate amount of gas within the small intestine and also within the colon. Previously administered oral contrast has passed into the colon and progressed as far as the rectum. No abnormal calcifications or bone findings. IMPRESSION: Patient continues to show some prominence of small-bowel loops which are gas containing. However, previously administered oral contrast has passed into the colon, indicating that if there is ongoing small bowel obstruction it is only partial. Electronically Signed   By: Nelson Chimes M.D.   On: 04/18/2020 15:09     Subjective: Eager to go home  Discharge Exam: Vitals:   04/19/20 1424 04/19/20 1436  BP: 100/65 (!) 98/58  Pulse: 68 64  Resp: 18   Temp:  98.6 F (37 C)  SpO2:     Vitals:   04/19/20 0918 04/19/20 1352 04/19/20 1424 04/19/20 1436  BP:   100/65 (!) 98/58  Pulse:  74 68 64  Resp:  18 18   Temp:  (!) 97.5 F (36.4 C)  98.6 F (37 C)  TempSrc:  Oral  Oral  SpO2: 95% 97%    Weight:      Height:        General: Pt is alert, awake, not in acute distress Cardiovascular: RRR, S1/S2 +, no rubs, no gallops Respiratory: CTA bilaterally, no wheezing, no rhonchi Abdominal: Soft, NT, ND, bowel sounds  + Extremities: no edema, no cyanosis   The results of significant diagnostics from this hospitalization (including imaging, microbiology, ancillary and laboratory) are listed below for reference.     Microbiology: Recent Results (from the past 240 hour(s))  SARS Coronavirus 2 by RT PCR (hospital order, performed in [facility] hospital lab) Nasopharyngeal Nasopharyngeal Swab     Status: None   Collection Time: 04/17/20  4:45 PM   Specimen: Nasopharyngeal Swab  Result Value Ref Range Status   SARS Coronavirus 2 NEGATIVE NEGATIVE Final    Comment: (NOTE) SARS-CoV-2 target nucleic acids are NOT DETECTED. The SARS-CoV-2 RNA is generally detectable in upper and lower respiratory specimens during the acute phase of infection. The lowest concentration of SARS-CoV-2 viral copies this assay can detect is 250 copies / mL. A negative result does not preclude SARS-CoV-2 infection and should not be used as the sole basis for treatment or other patient management decisions.  A negative result may occur with improper specimen collection / handling, submission of specimen other than nasopharyngeal swab, presence of viral mutation(s) within the areas targeted by this assay, and inadequate number of viral copies (<250 copies /  mL). A negative result must be combined with clinical observations, patient history, and epidemiological information. Fact Sheet for Patients:   StrictlyIdeas.no Fact Sheet for Healthcare Providers: BankingDealers.co.za This test is not yet approved or cleared  by the [place] FDA and has been authorized for detection and/or diagnosis of SARS-CoV-2 by FDA under an Emergency Use Authorization (EUA).  This EUA will remain in effect (meaning this test can be used) for the duration of the COVID-19 declaration under Section 564(b)(1) of the Act, 21 U.S.C. section 360bbb-3(b)(1), unless the authorization is terminated or revoked  sooner. Performed at [facility], [place] [address] [place], [place] [address]   Urine culture     Status: Abnormal   Collection Time: 04/17/20  7:10 PM   Specimen: Urine, Random  Result Value Ref Range Status   Specimen Description URINE, RANDOM  Final   Special Requests NONE  Final   Culture (A)  Final    80,000 COLONIES/mL STREPTOCOCCUS GROUP G Beta hemolytic streptococci are predictably susceptible to penicillin and other beta lactams. Susceptibility testing not routinely performed. Performed at [facility], [place] [address], [place], [place] [address]    Report Status 04/18/2020 FINAL  Final     Labs: BNP (last 3 results) No results for input(s): BNP in the last 8760 hours. Basic Metabolic Panel: Recent Labs  Lab 04/17/20 0822 04/18/20 0224 04/19/20 0347  NA 138 140 139  K 4.8 4.2 4.0  CL 103 105 108  CO2 22 23 23   GLUCOSE 117* 77 96  BUN 20 16 17   CREATININE 1.26* 1.10 1.09  CALCIUM 9.2 9.0 8.3*   Liver Function Tests: Recent Labs  Lab 04/17/20 0822 04/19/20 0347  AST 34 21  ALT 35 22  ALKPHOS 72 56  BILITOT 1.6* 1.0  PROT 7.7 6.6  ALBUMIN 4.1 3.5   Recent Labs  Lab 04/17/20 0822  LIPASE 29   No results for input(s): AMMONIA in the last 168 hours. CBC: Recent Labs  Lab 04/17/20 0822 04/18/20 0224 04/19/20 0347  WBC 9.6 9.8 8.7  HGB 14.8 13.3 12.9*  HCT 43.7 39.7 38.3*  MCV 96.5 96.4 96.7  PLT 268 229 217   Cardiac Enzymes: No results for input(s): CKTOTAL, CKMB, CKMBINDEX, TROPONINI in the last 168 hours. BNP: Invalid input(s): POCBNP CBG: Recent Labs  Lab 04/18/20 0808 04/19/20 0736  GLUCAP 80 89   D-Dimer No results for input(s): DDIMER in the last 72 hours. Hgb A1c No results for input(s): HGBA1C in the last 72 hours. Lipid Profile No results for input(s): CHOL, HDL, LDLCALC, TRIG, CHOLHDL, LDLDIRECT in the last 72 hours. Thyroid function studies No results for input(s): TSH, T4TOTAL, T3FREE, THYROIDAB in the last  72 hours.  Invalid input(s): FREET3 Anemia work up No results for input(s): VITAMINB12, FOLATE, FERRITIN, TIBC, IRON, RETICCTPCT in the last 72 hours. Urinalysis    Component Value Date/Time   COLORURINE YELLOW 04/17/2020 1630   APPEARANCEUR HAZY (A) 04/17/2020 1630   LABSPEC 1.025 04/17/2020 1630   PHURINE 5.0 04/17/2020 1630   GLUCOSEU NEGATIVE 04/17/2020 1630   HGBUR NEGATIVE 04/17/2020 1630   BILIRUBINUR NEGATIVE 04/17/2020 1630   KETONESUR 20 (A) 04/17/2020 1630   PROTEINUR NEGATIVE 04/17/2020 1630   UROBILINOGEN >=8.0 03/06/2018 1341   NITRITE NEGATIVE 04/17/2020 1630   LEUKOCYTESUR MODERATE (A) 04/17/2020 1630   Sepsis Labs Invalid input(s): PROCALCITONIN,  WBC,  LACTICIDVEN Microbiology Recent Results (from the past 240 hour(s))  SARS Coronavirus 2 by RT PCR (  hospital order, performed in [facility] [place] hospital lab) Nasopharyngeal Nasopharyngeal Swab     Status: None   Collection Time: 04/17/20  4:45 PM   Specimen: Nasopharyngeal Swab  Result Value Ref Range Status   SARS Coronavirus 2 NEGATIVE NEGATIVE Final    Comment: (NOTE) SARS-CoV-2 target nucleic acids are NOT DETECTED. The SARS-CoV-2 RNA is generally detectable in upper and lower respiratory specimens during the acute phase of infection. The lowest concentration of SARS-CoV-2 viral copies this assay can detect is 250 copies / mL. A negative result does not preclude SARS-CoV-2 infection and should not be used as the sole basis for treatment or other patient management decisions.  A negative result may occur with improper specimen collection / handling, submission of specimen other than nasopharyngeal swab, presence of viral mutation(s) within the areas targeted by this assay, and inadequate number of viral copies (<250 copies / mL). A negative result must be combined with clinical observations, patient history, and epidemiological information. Fact Sheet for Patients:    StrictlyIdeas.no Fact Sheet for Healthcare Providers: BankingDealers.co.za This test is not yet approved or cleared  by the [place] FDA and has been authorized for detection and/or diagnosis of SARS-CoV-2 by FDA under an Emergency Use Authorization (EUA).  This EUA will remain in effect (meaning this test can be used) for the duration of the COVID-19 declaration under Section 564(b)(1) of the Act, 21 U.S.C. section 360bbb-3(b)(1), unless the authorization is terminated or revoked sooner. Performed at [facility], [place] [address], [place], [place] [address]   Urine culture     Status: Abnormal   Collection Time: 04/17/20  7:10 PM   Specimen: Urine, Random  Result Value Ref Range Status   Specimen Description URINE, RANDOM  Final   Special Requests NONE  Final   Culture (A)  Final    80,000 COLONIES/mL STREPTOCOCCUS GROUP G Beta hemolytic streptococci are predictably susceptible to penicillin and other beta lactams. Susceptibility testing not routinely performed. Performed at [facility], [place] [address], [place], [place] [address]    Report Status 04/18/2020 FINAL  Final   Time spent: 30 min  SIGNED:   Marylu Lund, MD  [facility] 04/19/2020, 2:53 PM  If 7PM-7AM, please contact night-coverage

## 2020-04-19 NOTE — Discharge Instructions (Signed)
Please follow up with your PCP regarding when it is appropriate to resume blood pressure medication

## 2020-04-19 NOTE — Progress Notes (Signed)
    CC: abdominal pain, nausea and vomiting since 04/12/20  Subjective: He is feeling better.  Still somewhat distended.  He says he was having bowel movements throughout the day yesterday.  He has had this problem on multiple occasions in the past.  Objective: Vital signs in last 24 hours: Temp:  [97.5 F (36.4 C)-97.9 F (36.6 C)] 97.9 F (36.6 C) (05/20 0516) Pulse Rate:  [65-79] 65 (05/20 0516) Resp:  [18] 18 (05/19 2156) BP: (92-129)/(64-76) 101/64 (05/20 0516) SpO2:  [96 %-99 %] 96 % (05/20 0516)  240 PO 340 IV recorded . dextrose 5 % and 0.9% NaCl 75 mL/hr at 04/19/20 0001  NO BM recorded Afebrile, VSS AM labs are normal SBO 8 hr film:  Moderate amount of gas within the small intestine and also within the colon. Previously administered oral contrast has passed into the colon and progressed as far as the rectum  Intake/Output from previous day: 05/19 0701 - 05/20 0700 In: 580.3 [P.O.:240; I.V.:340.3] Out: -  Intake/Output this shift: No intake/output data recorded.  General appearance: alert, cooperative and no distress Resp: clear to auscultation bilaterally GI: Abdomen is soft bowel sounds are still high-pitched.  He still somewhat distended.  He is ambulating without difficulty.  He is having bowel movements.  Lab Results:  Recent Labs    04/18/20 0224 04/19/20 0347  WBC 9.8 8.7  HGB 13.3 12.9*  HCT 39.7 38.3*  PLT 229 217    BMET Recent Labs    04/18/20 0224 04/19/20 0347  NA 140 139  K 4.2 4.0  CL 105 108  CO2 23 23  GLUCOSE 77 96  BUN 16 17  CREATININE 1.10 1.09  CALCIUM 9.0 8.3*   PT/INR No results for input(s): LABPROT, INR in the last 72 hours.  Recent Labs  Lab 04/17/20 0822 04/19/20 0347  AST 34 21  ALT 35 22  ALKPHOS 72 56  BILITOT 1.6* 1.0  PROT 7.7 6.6  ALBUMIN 4.1 3.5     Lipase     Component Value Date/Time   LIPASE 29 04/17/2020 0822     Medications: . enoxaparin (LOVENOX) injection  40 mg Subcutaneous Q2000   . fentaNYL (SUBLIMAZE) injection  25 mcg Intravenous Once  . mometasone-formoterol  2 puff Inhalation BID  . pantoprazole (PROTONIX) IV  40 mg Intravenous Q24H  . sodium chloride flush  3 mL Intravenous Q12H    Assessment/Plan Hx hypertension Hx COPD/asthma Hx tobacco use 30 pack year hx, quit 30 years ago Hx chronic back pain Hx GERD Hx of adenomatous colon polyps BPH BMI 43.5  Recurrent SBO S/p exploratory laparotomy lysis of adhesions 12/15/2010, Dr. Donne Hazel S/p oratory laparotomy repair of umbilical hernia repair [id number] Dr. Ninfa Linden Hx appendectomy - SBP film showed contrast in colon and rectum yesterday FEN: IV fluids/clear liquids ID: None DVT: Lovenox Follow-up: TBD   Plan: We will get a put him on a soft diet but I told him to use full liquids primarily for the next 24-48hrs until his distention improves.  If he does well with breakfast and lunch he can go home after lunch from our standpoint.  LOS: 2 days    Kateena Degroote 04/19/2020 Please see Amion

## 2020-04-19 NOTE — Progress Notes (Signed)
Nsg Discharge Note  Admit Date:  04/17/2020 Discharge date: 04/19/2020   Roosvelt Harps to be D/C'd  per MD order.  AVS completed.  Patient/caregiver able to verbalize understanding.  Discharge Medication: Allergies as of 04/19/2020      Reactions   Oxycodone-acetaminophen Anaphylaxis   Advair Diskus [fluticasone-salmeterol]    unknown   Pentazocine Lactate Nausea Only, Other (See Comments)   Stopped breathing "talwin"      Medication List    STOP taking these medications   HYDROcodone-acetaminophen 7.5-325 MG tablet Commonly known as: Norco   methocarbamol 750 MG tablet Commonly known as: ROBAXIN   ondansetron 4 MG tablet Commonly known as: ZOFRAN   promethazine 25 MG suppository Commonly known as: Phenergan   promethazine 25 MG tablet Commonly known as: PHENERGAN   telmisartan 40 MG tablet Commonly known as: MICARDIS     TAKE these medications   albuterol 108 (90 Base) MCG/ACT inhaler Commonly known as: VENTOLIN HFA Inhale 2 puffs into the lungs every 6 (six) hours as needed for wheezing or shortness of breath.   azelastine 0.1 % nasal spray Commonly known as: ASTELIN Place 2 sprays into both nostrils 2 (two) times daily as needed for congestion.   budesonide-formoterol 80-4.5 MCG/ACT inhaler Commonly known as: SYMBICORT Inhale 2 puffs into the lungs 2 (two) times daily. Notes to patient: Next dose 04/19/20   loratadine 10 MG tablet Commonly known as: CLARITIN Take 10 mg by mouth daily as needed for allergies.   pantoprazole 40 MG tablet Commonly known as: PROTONIX Take 40 mg by mouth daily.   tamsulosin 0.4 MG Caps capsule Commonly known as: FLOMAX Take 0.4 mg by mouth daily.       Discharge Assessment: Vitals:   04/19/20 1424 04/19/20 1436  BP: 100/65 (!) 98/58  Pulse: 68 64  Resp: 18   Temp:  98.6 F (37 C)  SpO2:     Skin clean, dry and intact without evidence of skin break down, no evidence of skin tears noted. IV catheter discontinued  intact. Site without signs and symptoms of complications - no redness or edema noted at insertion site, patient denies c/o pain - only slight tenderness at site.  Dressing with slight pressure applied.  D/c Instructions-Education: Discharge instructions given to patient/family with verbalized understanding. D/c education completed with patient/family including follow up instructions, medication list, d/c activities limitations if indicated, with other d/c instructions as indicated by MD - patient able to verbalize understanding, all questions fully answered. Patient instructed to return to ED, call 911, or call MD for any changes in condition.  Patient escorted via [place], and D/C home via private auto.  Mitali Shenefield, Jolene Schimke, RN 04/19/2020 4:20 PM

## 2020-08-07 MED FILL — PANTOPRAZOLE SOD DR 40 MG T: 40 | 90 days supply | Qty: 90 | Fill #2

## 2020-08-07 MED FILL — TAMSULOSIN HCL 0.4 MG CAP: 0.4 | 90 days supply | Qty: 90 | Fill #2

## 2020-09-12 MED FILL — TELMISARTAN 40 MG TABS: 40 | 90 days supply | Qty: 90 | Fill #1

## 2020-11-05 MED FILL — PANTOPRAZOLE SOD DR 40 MG T: 40 | 90 days supply | Qty: 90 | Fill #3

## 2020-11-05 MED FILL — TAMSULOSIN HCL 0.4 MG CAP: 0.4 | 90 days supply | Qty: 90 | Fill #3

## 2020-11-05 MED FILL — SYMBICORT 80-4.5 MCG INH: 80-4.5 | 90 days supply | Qty: 31 | Fill #2

## 2020-11-07 ENCOUNTER — Other Ambulatory Visit (HOSPITAL_COMMUNITY): Payer: Self-pay | Admitting: Family Medicine

## 2020-11-07 MED FILL — DICLOFENAC SOD EC 75 MG TAB: 75 | 30 days supply | Qty: 60 | Fill #0

## 2020-12-06 MED FILL — TELMISARTAN 40 MG TABS: 40 | 90 days supply | Qty: 90 | Fill #2

## 2021-01-18 ENCOUNTER — Other Ambulatory Visit (HOSPITAL_COMMUNITY): Payer: Self-pay | Admitting: Family Medicine

## 2021-01-18 DIAGNOSIS — Z1389 Encounter for screening for other disorder: Secondary | ICD-10-CM | POA: Diagnosis not present

## 2021-01-18 DIAGNOSIS — K219 Gastro-esophageal reflux disease without esophagitis: Secondary | ICD-10-CM | POA: Diagnosis not present

## 2021-01-18 DIAGNOSIS — J449 Chronic obstructive pulmonary disease, unspecified: Secondary | ICD-10-CM | POA: Diagnosis not present

## 2021-01-18 DIAGNOSIS — I10 Essential (primary) hypertension: Secondary | ICD-10-CM | POA: Diagnosis not present

## 2021-01-18 DIAGNOSIS — I700 Atherosclerosis of aorta: Secondary | ICD-10-CM | POA: Diagnosis not present

## 2021-01-18 DIAGNOSIS — Z23 Encounter for immunization: Secondary | ICD-10-CM | POA: Diagnosis not present

## 2021-01-18 DIAGNOSIS — Z0000 Encounter for general adult medical examination without abnormal findings: Secondary | ICD-10-CM | POA: Diagnosis not present

## 2021-01-18 DIAGNOSIS — R7301 Impaired fasting glucose: Secondary | ICD-10-CM | POA: Diagnosis not present

## 2021-01-18 DIAGNOSIS — M25561 Pain in right knee: Secondary | ICD-10-CM | POA: Diagnosis not present

## 2021-01-24 ENCOUNTER — Ambulatory Visit: Payer: Self-pay

## 2021-01-24 ENCOUNTER — Other Ambulatory Visit: Payer: Self-pay | Admitting: Orthopaedic Surgery

## 2021-01-24 ENCOUNTER — Other Ambulatory Visit: Payer: Self-pay

## 2021-01-24 ENCOUNTER — Ambulatory Visit: Payer: Medicare Other | Admitting: Orthopaedic Surgery

## 2021-01-24 ENCOUNTER — Encounter: Payer: Self-pay | Admitting: Orthopaedic Surgery

## 2021-01-24 VITALS — Ht 68.0 in | Wt 260.0 lb

## 2021-01-24 DIAGNOSIS — M1712 Unilateral primary osteoarthritis, left knee: Secondary | ICD-10-CM

## 2021-01-24 DIAGNOSIS — M1711 Unilateral primary osteoarthritis, right knee: Secondary | ICD-10-CM | POA: Insufficient documentation

## 2021-01-24 MED ORDER — CELECOXIB 200 MG PO CAPS: 200.0000 mg | ORAL_CAPSULE | Freq: Two times a day (BID) | ORAL | 3 refills | Status: DC

## 2021-01-24 MED FILL — CELECOXIB 200 MG CAP: 200 | 15 days supply | Qty: 30 | Fill #0

## 2021-01-24 NOTE — Progress Notes (Signed)
Office Visit Note   Patient: Shane Clark           Date of Birth: Nov 09, 1951           MRN: [id number] Visit Date: 01/24/2021              Requested by: Gaynelle Arabian, MD [address] [place],  [place] [address] PCP: Gaynelle Arabian, MD   Assessment & Plan: Visit Diagnoses:  1. Primary osteoarthritis of right knee   2. Primary osteoarthritis of left knee     Plan: I impression is a right knee moderate OA.  We had discussion of treatment options and based on his options he would like to start with a prescription for Celebrex and he will grab some Voltaren gel over-the-counter.  We also talked about the importance of weight loss and strengthening.  Questions encouraged and answered.  Follow-up as needed.  Follow-Up Instructions: Return if symptoms worsen or fail to improve.   Orders:  Orders Placed This Encounter  Procedures  . XR KNEE 3 VIEW RIGHT   Meds ordered this encounter  Medications  . celecoxib (CELEBREX) 200 MG capsule    Sig: Take 1 capsule (200 mg total) by mouth 2 (two) times daily.    Dispense:  30 capsule    Refill:  3      Procedures: No procedures performed   Clinical Data: No additional findings.   Subjective: Chief Complaint  Patient presents with  . Right Knee - Pain    Mr. Pelfrey is a very pleasant 70 year old gentleman who I last saw about 3 and half years ago for a shoulder problem.  He comes in today for evaluation of a new problem of right knee pain.  He denies any injuries.  He has pain throughout and feels like it gives way especially on start up.  Pain is worse with flexion and walking and wakes him up at night.  He denies any grinding or mechanical symptoms or swelling.  He has been using ibuprofen and Tylenol with only temporary relief.  Denies history of gout.   Review of Systems  Constitutional: Negative.   All other systems reviewed and are negative.    Objective: Vital Signs: Ht 5\' 8"  (1.727 m)   Wt 260 lb  (117.9 kg)   BMI 39.53 kg/m   Physical Exam Vitals and nursing note reviewed.  Constitutional:      Appearance: He is well-developed and well-nourished.  HENT:     Head: Normocephalic and atraumatic.  Eyes:     Pupils: Pupils are equal, round, and reactive to light.  Pulmonary:     Effort: Pulmonary effort is normal.  Abdominal:     Palpations: Abdomen is soft.  Musculoskeletal:        General: Normal range of motion.     Cervical back: Neck supple.  Skin:    General: Skin is warm.  Neurological:     Mental Status: He is alert and oriented to person, place, and time.  Psychiatric:        Mood and Affect: Mood and affect normal.        Behavior: Behavior normal.        Thought Content: Thought content normal.        Judgment: Judgment normal.     Ortho Exam Right knee shows 1+ crepitus with range of motion.  No joint effusion.  Collaterals and cruciates are stable.  No joint line tenderness. Specialty Comments:  No specialty comments available.  Imaging: XR KNEE 3 VIEW RIGHT  Result Date: 01/24/2021 Moderate tricompartmental degenerative joint disease.    PMFS History: Patient Active Problem List   Diagnosis Date Noted  . Primary osteoarthritis of right knee 01/24/2021  . Primary osteoarthritis of left knee 01/24/2021  . SBO (small bowel obstruction) ([facility]) 02/13/2018  . Obesity (BMI 30-39.9) 02/16/2017  . Small bowel obstruction ([facility]) 02/15/2017  . SLAP tear of shoulder 02/11/2017  . Impingement syndrome of left shoulder 02/05/2017  . Hx of adenomatous colonic polyps 03/22/2016  . Essential hypertension 02/05/2016  . COPD (chronic obstructive pulmonary disease) ([facility]) 11/21/2011  . Diastolic dysfunction 77/93/9688  . BPH (benign prostatic hyperplasia) 11/21/2011  . Hypotension 11/20/2011   Past Medical History:  Diagnosis Date  . Allergy   . Arthritis   . Asthma   . BPH (benign prostatic hyperplasia)   . Chronic back pain    "all over"  . COPD  (chronic obstructive pulmonary disease) ([facility])   . GERD (gastroesophageal reflux disease)   . Hx of adenomatous colonic polyps 03/22/2016  . Hypertension   . Influenza 11/20/2011  . Sepsis ([facility]) 02/05/2016    Family History  Problem Relation Age of Onset  . Coronary artery disease Father   . Pancreatic cancer Brother   . Stroke Brother   . Heart disease Brother   . Dementia Mother   . Diabetes type II Other   . Colon cancer Neg Hx   . Colon polyps Neg Hx   . Rectal cancer Neg Hx   . Stomach cancer Neg Hx     Past Surgical History:  Procedure Laterality Date  . ABDOMINAL HERNIA REPAIR    . ANTERIOR CERVICAL DECOMP/DISCECTOMY FUSION     C6-8  . APPENDECTOMY  1960s  . BACK SURGERY    . COLONOSCOPY    . HERNIA REPAIR    . INSERTION DISTRACTION DEVICE LUMBAR POSTERIOR SPINOUS PROCESS    . KNEE ARTHROSCOPY Left   . SHOULDER SURGERY Left 02/11/2017  . SMALL BOWEL REPAIR     Social History   Occupational History  . Not on file  Tobacco Use  . Smoking status: Former Smoker    Packs/day: 0.12    Years: 20.00    Pack years: 2.40    Types: Cigarettes  . Smokeless tobacco: Former Systems developer  . Tobacco comment: "quit smoking cigarettes in the 1980s"  Substance and Sexual Activity  . Alcohol use: No    Alcohol/week: 0.0 standard drinks  . Drug use: No  . Sexual activity: Yes

## 2021-01-25 DIAGNOSIS — K219 Gastro-esophageal reflux disease without esophagitis: Secondary | ICD-10-CM | POA: Diagnosis not present

## 2021-01-25 DIAGNOSIS — I10 Essential (primary) hypertension: Secondary | ICD-10-CM | POA: Diagnosis not present

## 2021-01-25 DIAGNOSIS — J449 Chronic obstructive pulmonary disease, unspecified: Secondary | ICD-10-CM | POA: Diagnosis not present

## 2021-02-06 MED FILL — PANTOPRAZOLE SOD DR 40 MG T: 40 | 90 days supply | Qty: 90 | Fill #0

## 2021-02-06 MED FILL — SYMBICORT 80-4.5 MCG INH: 80-4.5 | 90 days supply | Qty: 31 | Fill #0

## 2021-02-06 MED FILL — TAMSULOSIN HCL 0.4 MG CAP: 0.4 | 90 days supply | Qty: 90 | Fill #0

## 2021-03-06 DIAGNOSIS — K219 Gastro-esophageal reflux disease without esophagitis: Secondary | ICD-10-CM | POA: Diagnosis not present

## 2021-03-06 DIAGNOSIS — J449 Chronic obstructive pulmonary disease, unspecified: Secondary | ICD-10-CM | POA: Diagnosis not present

## 2021-03-06 DIAGNOSIS — I10 Essential (primary) hypertension: Secondary | ICD-10-CM | POA: Diagnosis not present

## 2021-03-11 ENCOUNTER — Other Ambulatory Visit (HOSPITAL_COMMUNITY): Payer: Self-pay

## 2021-03-14 ENCOUNTER — Other Ambulatory Visit (HOSPITAL_COMMUNITY): Payer: Self-pay

## 2021-03-14 MED ORDER — TELMISARTAN 40 MG PO TABS
40.0000 mg | ORAL_TABLET | Freq: Every day | ORAL | 3 refills | Status: DC
  Filled 2021-03-14: qty 90, 90d supply, fill #0

## 2021-03-14 MED FILL — Telmisartan Tab 40 MG: ORAL | 90 days supply | Qty: 90 | Fill #0 | Status: AC

## 2021-04-02 DIAGNOSIS — H2513 Age-related nuclear cataract, bilateral: Secondary | ICD-10-CM | POA: Diagnosis not present

## 2021-04-02 DIAGNOSIS — H43393 Other vitreous opacities, bilateral: Secondary | ICD-10-CM | POA: Diagnosis not present

## 2021-04-02 DIAGNOSIS — H43811 Vitreous degeneration, right eye: Secondary | ICD-10-CM | POA: Diagnosis not present

## 2021-05-03 ENCOUNTER — Other Ambulatory Visit (HOSPITAL_COMMUNITY): Payer: Self-pay

## 2021-05-03 MED ORDER — ALBUTEROL SULFATE HFA 108 (90 BASE) MCG/ACT IN AERS
1.0000 | INHALATION_SPRAY | RESPIRATORY_TRACT | 3 refills | Status: DC | PRN
  Filled 2021-05-03: qty 8.5, 16d supply, fill #0
  Filled 2021-12-16: qty 8.5, 16d supply, fill #1

## 2021-05-10 ENCOUNTER — Other Ambulatory Visit (HOSPITAL_COMMUNITY): Payer: Self-pay

## 2021-05-10 MED FILL — Budesonide-Formoterol Fumarate Dihyd Aerosol 80-4.5 MCG/ACT: RESPIRATORY_TRACT | 90 days supply | Qty: 30.6 | Fill #0 | Status: AC

## 2021-05-10 MED FILL — Pantoprazole Sodium EC Tab 40 MG (Base Equiv): ORAL | 90 days supply | Qty: 90 | Fill #0 | Status: AC

## 2021-05-10 MED FILL — Tamsulosin HCl Cap 0.4 MG: ORAL | 90 days supply | Qty: 90 | Fill #0 | Status: AC

## 2021-06-06 ENCOUNTER — Other Ambulatory Visit (HOSPITAL_COMMUNITY): Payer: Self-pay

## 2021-06-06 MED FILL — Telmisartan Tab 40 MG: ORAL | 90 days supply | Qty: 90 | Fill #1 | Status: AC

## 2021-06-12 ENCOUNTER — Other Ambulatory Visit (HOSPITAL_COMMUNITY): Payer: Self-pay

## 2021-06-12 ENCOUNTER — Telehealth: Payer: Self-pay | Admitting: Internal Medicine

## 2021-06-12 DIAGNOSIS — I700 Atherosclerosis of aorta: Secondary | ICD-10-CM | POA: Diagnosis not present

## 2021-06-12 DIAGNOSIS — R0602 Shortness of breath: Secondary | ICD-10-CM | POA: Diagnosis not present

## 2021-06-12 DIAGNOSIS — J449 Chronic obstructive pulmonary disease, unspecified: Secondary | ICD-10-CM | POA: Diagnosis not present

## 2021-06-12 DIAGNOSIS — R079 Chest pain, unspecified: Secondary | ICD-10-CM | POA: Diagnosis not present

## 2021-06-12 DIAGNOSIS — I2510 Atherosclerotic heart disease of native coronary artery without angina pectoris: Secondary | ICD-10-CM | POA: Diagnosis not present

## 2021-06-12 DIAGNOSIS — I10 Essential (primary) hypertension: Secondary | ICD-10-CM | POA: Diagnosis not present

## 2021-06-12 MED ORDER — NITROGLYCERIN 0.4 MG SL SUBL
SUBLINGUAL_TABLET | SUBLINGUAL | 0 refills | Status: DC
  Filled 2021-06-12: qty 25, 5d supply, fill #0

## 2021-06-12 NOTE — Telephone Encounter (Signed)
Spoke with the patient who states that he has been having on and off chest pain for the past week. He states that he is currently having chest pain. He reports that he has also been experiencing shortness of breath, nausea, headache, fatigue, shoulder pain and left arm pain. He does not have any nitroglycerin. Advised patient that he needs to go to the emergency room for evaluation. Patient verbalized understanding.

## 2021-06-12 NOTE — Telephone Encounter (Signed)
Pt c/o of Chest Pain: STAT if CP now or developed within 24 hours  1. Are you having CP right now? Yes   2. Are you experiencing any other symptoms (ex. SOB, nausea, vomiting, sweating)? SOB, lightheadedness, headache, very tired, nausea  3. How long have you been experiencing CP? Week and half  4. Is your CP continuous or coming and going? Coming and going  5. Have you taken Nitroglycerin? Out of it ?

## 2021-06-13 ENCOUNTER — Other Ambulatory Visit: Payer: Self-pay

## 2021-06-13 ENCOUNTER — Ambulatory Visit (HOSPITAL_COMMUNITY)
Admission: RE | Admit: 2021-06-13 | Discharge: 2021-06-13 | Disposition: A | Discharge status: Home / Self Care | Payer: Medicare Other | Source: Ambulatory Visit | Attending: Physician Assistant | Admitting: Physician Assistant

## 2021-06-13 ENCOUNTER — Other Ambulatory Visit (HOSPITAL_COMMUNITY): Payer: Self-pay | Admitting: Physician Assistant

## 2021-06-13 DIAGNOSIS — R0602 Shortness of breath: Secondary | ICD-10-CM

## 2021-06-13 DIAGNOSIS — R079 Chest pain, unspecified: Secondary | ICD-10-CM | POA: Diagnosis not present

## 2021-06-13 NOTE — Telephone Encounter (Signed)
I spoke w the pt.  He saw PCP yesterday.  EKG was normal and they sent him for a CXR.  He still feels poorly.  Lightheaded, SOB with or without activity and pressure in the center of his chest.  He is belching a lot more than normal.  Continues protonix.  No history of CAD.  I have scheduled him back with Dr. Harrington Challenger on 07/16/21.    Pt was last seen 2016 by Dr. Harrington Challenger.

## 2021-06-14 NOTE — Telephone Encounter (Signed)
Called pt and let him know we have added him to waitlist to see Dr. Harrington Challenger in [place] as this may be sooner than 07/16/21.  No answer, do not see permission to leave detailed message so I left message asking him to check MyChart.  Sent MyChart update that he has been added to wait list.

## 2021-07-15 NOTE — Progress Notes (Signed)
Cardiology Office Note   Date:  07/16/2021   ID:  Shane Clark, DOB 11-11-1951, MRN [id number]  PCP:  Gaynelle Arabian, MD  Cardiologist:   Dorris Carnes, MD   Pt presents for follow up of CP     History of Present Illness: Shane Clark is a 70 y.o. male with a history of chest pain  The pt has hs had several caths in past that showed no evidence of CAD Last cath in [place] in 2007 was normal I last saw the pt in 2016  IN June 2022 the pt drove to [employer] he got out of car often  [employer]or, [employer] for  a church meeting   When got back to hotel he felt his pulse thorughout his body   Chest started hurting   Episode   lasted about 20 min   Has not had that pounding feeling since  Never had before   Since then he has noted continued intermitt spells of chest pressure and SOB with activity   No palpitations   No dizziness  He says the episodes ease on own       Current Meds  Medication Sig   albuterol (PROVENTIL HFA;VENTOLIN HFA) 108 (90 BASE) MCG/ACT inhaler Inhale 2 puffs into the lungs every 6 (six) hours as needed for wheezing or shortness of breath.    albuterol (VENTOLIN HFA) 108 (90 Base) MCG/ACT inhaler Inhale 1-2 puffs into the lungs every 4 (four) hours as needed.   aspirin EC 81 MG tablet Take 81 mg by mouth daily. Swallow whole.   budesonide-formoterol (SYMBICORT) 80-4.5 MCG/ACT inhaler Inhale 2 puffs into the lungs 2 (two) times daily.   budesonide-formoterol (SYMBICORT) 80-4.5 MCG/ACT inhaler INHALE 2 PUFFS BY MOUTH 2 TIMES A DAY   loratadine (CLARITIN) 10 MG tablet Take 10 mg by mouth daily as needed for allergies.    Multiple Vitamins-Minerals (ZINC PO) Take 1 tablet by mouth daily. CD ZINC   nitroGLYCERIN (NITROSTAT) 0.4 MG SL tablet Place 1 tablet under the tongue as needed   pantoprazole (PROTONIX) 40 MG tablet Take 40 mg by mouth daily.   pantoprazole (PROTONIX) 40 MG tablet TAKE 1 TABLET BY MOUTH ONCE A DAY FOR ACID REFLUX   tamsulosin (FLOMAX) 0.4 MG CAPS capsule  TAKE 1 CAPSULE BY MOUTH 30 MINUTES AFTER SAME MEAL EACH DAY FOR URINARY SYMPTOMS   Tamsulosin HCl (FLOMAX) 0.4 MG CAPS Take 0.4 mg by mouth daily.   telmisartan (MICARDIS) 40 MG tablet TAKE 1 TABLET BY MOUTH ONCE A DAY FOR BLOOD PRESSURE   telmisartan (MICARDIS) 40 MG tablet Take 1 tablet by mouth  once daily for blood pressure     Allergies:   Oxycodone-acetaminophen, Advair diskus [fluticasone-salmeterol], and Pentazocine lactate   Past Medical History:  Diagnosis Date   Allergy    Arthritis    Asthma    BPH (benign prostatic hyperplasia)    Chronic back pain    "all over"   COPD (chronic obstructive pulmonary disease) ([facility])    GERD (gastroesophageal reflux disease)    Hx of adenomatous colonic polyps 03/22/2016   Hypertension    Influenza 11/20/2011   Sepsis ([facility]) 02/05/2016    Past Surgical History:  Procedure Laterality Date   ABDOMINAL HERNIA REPAIR     ANTERIOR CERVICAL DECOMP/DISCECTOMY FUSION     C6-8   APPENDECTOMY  1960s   BACK SURGERY     COLONOSCOPY     HERNIA REPAIR  INSERTION DISTRACTION DEVICE LUMBAR POSTERIOR SPINOUS PROCESS     KNEE ARTHROSCOPY Left    SHOULDER SURGERY Left 02/11/2017   SMALL BOWEL REPAIR       Social History:  The patient  reports that he has quit smoking. His smoking use included cigarettes. He has a 2.40 pack-year smoking history. He has quit using smokeless tobacco. He reports that he does not drink alcohol and does not use drugs.   Family History:  The patient's family history includes Coronary artery disease in his father; Dementia in his mother; Diabetes type II in an other family member; Heart disease in his brother; Pancreatic cancer in his brother; Stroke in his brother.    ROS:  Please see the history of present illness. All other systems are reviewed and  Negative to the above problem except as noted.    PHYSICAL EXAM: VS:  BP (!) 142/74   Pulse 69   Ht '5\' 8"'$  (1.727 m)   Wt 264 lb 9.6 oz (120 kg)   SpO2 97%   BMI  40.23 kg/m   GEN: Morbidly obese 70 yo  in no acute distress  HEENT: normal  Neck: no JVD, carotid bruits Cardiac: RRR; no murmurs, Tr LE edema  Respiratory:  clear to auscultation bilaterally, GI: soft, nontender, nondistended, + BS  No hepatomegaly  MS: no deformity Moving all extremities   Skin: warm and dry, no rash Neuro:  Strength and sensation are intact Psych: euthymic mood, full affect   EKG:  EKG is ordered today.  SR 69 bpm    Lipid Panel    Component Value Date/Time   CHOL 111 03/29/2008 1401   TRIG 62 03/29/2008 1401   HDL 22.9 (L) 03/29/2008 1401   CHOLHDL 4.8 CALC 03/29/2008 1401   VLDL 12 03/29/2008 1401   LDLCALC 76 03/29/2008 1401      Wt Readings from Last 3 Encounters:  07/16/21 264 lb 9.6 oz (120 kg)  01/24/21 260 lb (117.9 kg)  04/17/20 270 lb (122.5 kg)      ASSESSMENT AND PLAN:  1  Chest presssure/ DOE   The pt has noted since earlier this summer   Intermitt   he has had more than one cath in the past     (CT of abdomen in the past couple years shows only scattered plaquing of aorta)   I would recomm CT coronary angiogram instead of invasive procedure    Also set up for echo for systolilc /diastolic analysis     Keep on ASA  Actviity as tolerated      2   HTN  BP is a little high   Follow   3  Lipdis   LDL 112   Will address when find out CT results       Current medicines are reviewed at length with the patient today.  The patient does not have concerns regarding medicines.  Signed, Dorris Carnes, MD  07/16/2021 10:19 AM    [facility] [place], [place], [place]  [address] Phone: [phone]; Fax: [phone]

## 2021-07-16 ENCOUNTER — Encounter: Payer: Self-pay | Admitting: Internal Medicine

## 2021-07-16 ENCOUNTER — Other Ambulatory Visit (HOSPITAL_COMMUNITY): Payer: Self-pay

## 2021-07-16 ENCOUNTER — Other Ambulatory Visit: Payer: Self-pay

## 2021-07-16 ENCOUNTER — Ambulatory Visit: Payer: Medicare Other | Admitting: Internal Medicine

## 2021-07-16 VITALS — BP 142/74 | HR 69 | Ht 68.0 in | Wt 264.6 lb

## 2021-07-16 DIAGNOSIS — Z01812 Encounter for preprocedural laboratory examination: Secondary | ICD-10-CM

## 2021-07-16 DIAGNOSIS — R072 Precordial pain: Secondary | ICD-10-CM | POA: Diagnosis not present

## 2021-07-16 MED ORDER — METOPROLOL TARTRATE 100 MG PO TABS
ORAL_TABLET | ORAL | 0 refills | Status: DC
  Filled 2021-07-16: qty 1, 1d supply, fill #0

## 2021-07-16 MED ORDER — NITROGLYCERIN 0.4 MG SL SUBL
0.4000 mg | SUBLINGUAL_TABLET | SUBLINGUAL | 2 refills | Status: DC | PRN
  Filled 2021-07-16: qty 25, 25d supply, fill #0

## 2021-07-16 NOTE — Patient Instructions (Addendum)
Medication Instructions:  No changes *If you need a refill on your cardiac medications before your next appointment, please call your pharmacy*   Lab Work: Today: BMET  If you have labs (blood work) drawn today and your tests are completely normal, you will receive your results only by: [place] (if you have MyChart) OR A paper copy in the mail If you have any lab test that is abnormal or we need to change your treatment, we will call you to review the results.   Testing/Procedures:  Your physician has requested that you have an echocardiogram. Echocardiography is a painless test that uses sound waves to create images of your heart. It provides your doctor with information about the size and shape of your heart and how well your heart's chambers and valves are working. This procedure takes approximately one hour. There are no restrictions for this procedure.    Your cardiac CT will be scheduled at one of the below locations:   [facility] [address] [place], [place] [address] [phone]   Please arrive at the [facility] main entrance (entrance A) of [facility] 30 minutes prior to test start time. Proceed to the [facility] Radiology Department (first floor) to check-in and test prep.  Please follow these instructions carefully (unless otherwise directed):  Hold all erectile dysfunction medications at least 3 days (72 hrs) prior to test.  On the Night Before the Test: Be sure to Drink plenty of water. Do not consume any caffeinated/decaffeinated beverages or chocolate 12 hours prior to your test. Do not take any antihistamines 12 hours prior to your test.  On the Day of the Test: Drink plenty of water until 1 hour prior to the test. Do not eat any food 4 hours prior to the test. You may take your regular medications prior to the test.  Take metoprolol (Lopressor) two hours prior to test. HOLD Furosemide/Hydrochlorothiazide morning of the  test.       After the Test: Drink plenty of water. After receiving IV contrast, you may experience a mild flushed feeling. This is normal. On occasion, you may experience a mild rash up to 24 hours after the test. This is not dangerous. If this occurs, you can take Benadryl 25 mg and increase your fluid intake. If you experience trouble breathing, this can be serious. If it is severe call 911 IMMEDIATELY. If it is mild, please call our office. If you take any of these medications: Glipizide/Metformin, Avandament, Glucavance, please do not take 48 hours after completing test unless otherwise instructed.  Please allow 2-4 weeks for scheduling of routine cardiac CTs. Some insurance companies require a pre-authorization which may delay scheduling of this test.   For non-scheduling related questions, please contact the cardiac imaging nurse navigator should you have any questions/concerns: Marchia Bond, Cardiac Imaging Nurse Navigator Gordy Clement, Cardiac Imaging Nurse Navigator [facility] Direct Office Dial: [phone]   For scheduling needs, including cancellations and rescheduling, please call [place], [phone].  Follow-Up: Follow up with your physician will depend on test results.   Other Instructions

## 2021-07-17 LAB — BASIC METABOLIC PANEL
BUN/Creatinine Ratio: 15 (ref 10–24)
BUN: 14 mg/dL (ref 8–27)
CO2: 24 mmol/L (ref 20–29)
Calcium: 9.2 mg/dL (ref 8.6–10.2)
Chloride: 104 mmol/L (ref 96–106)
Creatinine, Ser: 0.93 mg/dL (ref 0.76–1.27)
Glucose: 76 mg/dL (ref 65–99)
Potassium: 5 mmol/L (ref 3.5–5.2)
Sodium: 141 mmol/L (ref 134–144)
eGFR: 89 mL/min/{1.73_m2} (ref >59)

## 2021-07-23 ENCOUNTER — Other Ambulatory Visit: Payer: Self-pay

## 2021-07-23 ENCOUNTER — Ambulatory Visit (HOSPITAL_COMMUNITY): Payer: Medicare Other | Attending: Cardiology

## 2021-07-23 ENCOUNTER — Encounter (HOSPITAL_COMMUNITY): Payer: Self-pay

## 2021-07-23 DIAGNOSIS — R072 Precordial pain: Secondary | ICD-10-CM | POA: Insufficient documentation

## 2021-07-23 DIAGNOSIS — Z01818 Encounter for other preprocedural examination: Secondary | ICD-10-CM | POA: Diagnosis not present

## 2021-07-23 DIAGNOSIS — Z01812 Encounter for preprocedural laboratory examination: Secondary | ICD-10-CM | POA: Diagnosis not present

## 2021-07-23 LAB — ECHOCARDIOGRAM COMPLETE
Area-P 1/2: 3.17 cm2
S' Lateral: 2.4 cm

## 2021-07-23 MED ORDER — PERFLUTREN LIPID MICROSPHERE
1.0000 mL | INTRAVENOUS | Status: AC | PRN
Start: 2021-07-23 — End: 2021-07-23
  Administered 2021-07-23: 2 mL via INTRAVENOUS

## 2021-07-24 ENCOUNTER — Telehealth (HOSPITAL_COMMUNITY): Payer: Self-pay | Admitting: Emergency Medicine

## 2021-07-24 NOTE — Telephone Encounter (Signed)
Reaching out to patient to offer assistance regarding upcoming cardiac imaging study; pt verbalizes understanding of appt date/time, parking situation and where to check in, pre-test NPO status and medications ordered, and verified current allergies; name and call back number provided for further questions should they arise Marchia Bond RN Navigator Cardiac Imaging [facility] [phone] office [phone] cell  Some claustro Denies iv issues '100mg'$  metop tart

## 2021-07-24 NOTE — Telephone Encounter (Signed)
Attempted to call patient regarding upcoming cardiac CT appointment. °Left message on voicemail with name and callback number °Soley Harriss RN Navigator Cardiac Imaging °[facility] °[phone] Office °[phone] Cell ° °

## 2021-07-26 ENCOUNTER — Ambulatory Visit (HOSPITAL_COMMUNITY)
Admission: RE | Admit: 2021-07-26 | Discharge: 2021-07-26 | Disposition: A | Discharge status: Home / Self Care | Payer: Medicare Other | Source: Ambulatory Visit | Attending: Internal Medicine | Admitting: Internal Medicine

## 2021-07-26 ENCOUNTER — Other Ambulatory Visit: Payer: Self-pay

## 2021-07-26 ENCOUNTER — Telehealth: Payer: Self-pay

## 2021-07-26 DIAGNOSIS — E785 Hyperlipidemia, unspecified: Secondary | ICD-10-CM

## 2021-07-26 DIAGNOSIS — R072 Precordial pain: Secondary | ICD-10-CM | POA: Diagnosis not present

## 2021-07-26 DIAGNOSIS — I10 Essential (primary) hypertension: Secondary | ICD-10-CM

## 2021-07-26 MED ORDER — IOHEXOL 350 MG/ML SOLN
95.0000 mL | Freq: Once | INTRAVENOUS | Status: AC | PRN
  Administered 2021-07-26: 95 mL via INTRAVENOUS

## 2021-07-26 MED ORDER — HYDROCHLOROTHIAZIDE 12.5 MG PO CAPS: 12.5000 mg | ORAL_CAPSULE | Freq: Every day | ORAL | 3 refills | Status: DC

## 2021-07-26 MED ORDER — NITROGLYCERIN 0.4 MG SL SUBL
SUBLINGUAL_TABLET | SUBLINGUAL | Status: AC
  Filled 2021-07-26: qty 2

## 2021-07-26 MED ORDER — NITROGLYCERIN 0.4 MG SL SUBL
0.8000 mg | SUBLINGUAL_TABLET | Freq: Once | SUBLINGUAL | Status: AC
  Administered 2021-07-26: 0.8 mg via SUBLINGUAL

## 2021-07-26 MED ORDER — ROSUVASTATIN CALCIUM 10 MG PO TABS
10.0000 mg | ORAL_TABLET | Freq: Every day | ORAL | 2 refills | Status: DC
  Filled 2021-08-06: qty 90, 90d supply, fill #0

## 2021-07-26 NOTE — Progress Notes (Signed)
Pt has been made aware of normal result and verbalized understanding.  jw

## 2021-07-26 NOTE — Telephone Encounter (Signed)
Called pt and reviewed medication changed recommended by MD.   He is agreeable to plan.  Medications ordered and f/u lab appointments scheduled.

## 2021-07-26 NOTE — Telephone Encounter (Signed)
-----   Message from Fay Records, MD sent at 07/26/2021  1:55 PM EDT ----- Spoke to pt   REviewed CT   Minimal plaquing    Would recomm   1.  Needs tighter control of cholesterol   REcomm Crestor 10 mg   Follow up lipomed panel in 8 weeks with AST 2.  Add HCTZ 12.5 mg to BP regimen  Check BMET in 10 days  3  F/U in clinic in later October . November

## 2021-08-05 MED FILL — Budesonide-Formoterol Fumarate Dihyd Aerosol 80-4.5 MCG/ACT: RESPIRATORY_TRACT | 90 days supply | Qty: 30.6 | Fill #1 | Status: AC

## 2021-08-05 MED FILL — Pantoprazole Sodium EC Tab 40 MG (Base Equiv): ORAL | 90 days supply | Qty: 90 | Fill #1 | Status: CN

## 2021-08-05 MED FILL — Tamsulosin HCl Cap 0.4 MG: ORAL | 90 days supply | Qty: 90 | Fill #1 | Status: AC

## 2021-08-06 ENCOUNTER — Other Ambulatory Visit (HOSPITAL_COMMUNITY): Payer: Self-pay

## 2021-08-06 ENCOUNTER — Other Ambulatory Visit: Payer: Self-pay

## 2021-08-06 ENCOUNTER — Other Ambulatory Visit: Payer: Medicare Other | Admitting: *Deleted

## 2021-08-06 DIAGNOSIS — I10 Essential (primary) hypertension: Secondary | ICD-10-CM

## 2021-08-06 LAB — BASIC METABOLIC PANEL
BUN/Creatinine Ratio: 18 (ref 10–24)
BUN: 20 mg/dL (ref 8–27)
CO2: 24 mmol/L (ref 20–29)
Calcium: 9.4 mg/dL (ref 8.6–10.2)
Chloride: 99 mmol/L (ref 96–106)
Creatinine, Ser: 1.12 mg/dL (ref 0.76–1.27)
Glucose: 85 mg/dL (ref 65–99)
Potassium: 4.5 mmol/L (ref 3.5–5.2)
Sodium: 137 mmol/L (ref 134–144)
eGFR: 71 mL/min/{1.73_m2} (ref >59)

## 2021-08-06 MED FILL — Pantoprazole Sodium EC Tab 40 MG (Base Equiv): ORAL | 90 days supply | Qty: 90 | Fill #1 | Status: AC

## 2021-08-09 ENCOUNTER — Encounter: Payer: Self-pay | Admitting: Internal Medicine

## 2021-09-08 MED FILL — Telmisartan Tab 40 MG: ORAL | 90 days supply | Qty: 90 | Fill #2 | Status: AC

## 2021-09-09 ENCOUNTER — Other Ambulatory Visit (HOSPITAL_COMMUNITY): Payer: Self-pay

## 2021-09-16 ENCOUNTER — Other Ambulatory Visit: Payer: Self-pay

## 2021-09-16 ENCOUNTER — Other Ambulatory Visit: Payer: Medicare Other | Admitting: *Deleted

## 2021-09-16 DIAGNOSIS — E785 Hyperlipidemia, unspecified: Secondary | ICD-10-CM | POA: Diagnosis not present

## 2021-09-16 LAB — AST: AST: 18 IU/L (ref 0–40)

## 2021-09-16 LAB — LIPID PANEL
Chol/HDL Ratio: 2.2 ratio (ref 0.0–5.0)
Cholesterol, Total: 87 mg/dL — ABNORMAL LOW (ref 100–199)
HDL: 39 mg/dL — ABNORMAL LOW (ref >39)
LDL Chol Calc (NIH): 36 mg/dL (ref 0–99)
Triglycerides: 44 mg/dL (ref 0–149)
VLDL Cholesterol Cal: 12 mg/dL (ref 5–40)

## 2021-09-30 ENCOUNTER — Other Ambulatory Visit: Payer: Self-pay

## 2021-09-30 ENCOUNTER — Ambulatory Visit: Payer: Medicare Other | Admitting: Internal Medicine

## 2021-09-30 ENCOUNTER — Encounter: Payer: Self-pay | Admitting: Internal Medicine

## 2021-09-30 VITALS — BP 106/56 | HR 76 | Ht 68.0 in | Wt 267.0 lb

## 2021-09-30 DIAGNOSIS — I2510 Atherosclerotic heart disease of native coronary artery without angina pectoris: Secondary | ICD-10-CM

## 2021-09-30 MED ORDER — TELMISARTAN 40 MG PO TABS: 20.0000 mg | ORAL_TABLET | Freq: Every day | ORAL | 3 refills | Status: DC

## 2021-09-30 MED ORDER — ROSUVASTATIN CALCIUM 10 MG PO TABS: 5.0000 mg | ORAL_TABLET | Freq: Every day | ORAL | 2 refills | Status: DC

## 2021-09-30 NOTE — Progress Notes (Signed)
Cardiology Office Note   Date:  09/30/2021   ID:  Shane Clark, DOB 08/14/1951, MRN [id number]  PCP:  Gaynelle Arabian, MD  Cardiologist:   Dorris Carnes, MD   Pt presents for follow up of CP     History of Present Illness: Shane Clark is a 70 y.o. male with a history of chest pain  The pt has hs had several caths in past that showed no evidence of CAD Last cath in [place] in 2007 was normal  IN June 2022 the pt drove to [employer] he got out of car often  [employer], [employer] for  a church meeting   When got back to hotel he felt his pulse thorughout his body   Chest started hurting   Episode   lasted about 20 min    I saw the pt after this event   Ordered cardiac CT CA score was 53   Minimal CAD noted   The pt says he is doing OK no  He denies CP  His breathing is OK   Still will some dizziness   BP was in 90s/ once   Diet:   Br:   Raison Bran Crunch  or grits Lunch:  Ambulance person and veg No snaks    Br 7 am   Dinner 6 PM    Current Meds  Medication Sig   albuterol (PROVENTIL HFA;VENTOLIN HFA) 108 (90 BASE) MCG/ACT inhaler Inhale 2 puffs into the lungs every 6 (six) hours as needed for wheezing or shortness of breath.    aspirin EC 81 MG tablet Take 81 mg by mouth daily. Swallow whole.   budesonide-formoterol (SYMBICORT) 80-4.5 MCG/ACT inhaler Inhale 2 puffs into the lungs 2 (two) times daily.   diclofenac (VOLTAREN) 75 MG EC tablet TAKE 1 TABLET BY MOUTH 2 TIMES DAILY FOR PAIN AND INFLAMMATION   hydrochlorothiazide (MICROZIDE) 12.5 MG capsule Take 1 capsule (12.5 mg total) by mouth daily.   loratadine (CLARITIN) 10 MG tablet Take 10 mg by mouth daily as needed for allergies.    metoprolol tartrate (LOPRESSOR) 100 MG tablet Take one tablet by mouth 2 hours before your CT   Multiple Vitamins-Minerals (ZINC PO) Take 1 tablet by mouth daily. CD ZINC   nitroGLYCERIN (NITROSTAT) 0.4 MG SL tablet Place 1 tablet (0.4 mg total) under the tongue as needed.   pantoprazole (PROTONIX)  40 MG tablet Take 40 mg by mouth daily.   rosuvastatin (CRESTOR) 10 MG tablet Take 1 tablet (10 mg total) by mouth daily.   tamsulosin (FLOMAX) 0.4 MG CAPS capsule TAKE 1 CAPSULE BY MOUTH 30 MINUTES AFTER SAME MEAL EACH DAY FOR URINARY SYMPTOMS   telmisartan (MICARDIS) 40 MG tablet TAKE 1 TABLET BY MOUTH ONCE A DAY FOR BLOOD PRESSURE     Allergies:   Oxycodone-acetaminophen, Advair diskus [fluticasone-salmeterol], and Pentazocine lactate   Past Medical History:  Diagnosis Date   Allergy    Arthritis    Asthma    BPH (benign prostatic hyperplasia)    Chronic back pain    "all over"   COPD (chronic obstructive pulmonary disease) ([facility])    GERD (gastroesophageal reflux disease)    Hx of adenomatous colonic polyps 03/22/2016   Hypertension    Influenza 11/20/2011   Sepsis ([facility]) 02/05/2016    Past Surgical History:  Procedure Laterality Date   ABDOMINAL HERNIA REPAIR     ANTERIOR CERVICAL DECOMP/DISCECTOMY FUSION     C6-8   APPENDECTOMY  1960s   BACK SURGERY     COLONOSCOPY     HERNIA REPAIR     INSERTION DISTRACTION DEVICE LUMBAR POSTERIOR SPINOUS PROCESS     KNEE ARTHROSCOPY Left    SHOULDER SURGERY Left 02/11/2017   SMALL BOWEL REPAIR       Social History:  The patient  reports that he has quit smoking. His smoking use included cigarettes. He has a 2.40 pack-year smoking history. He has quit using smokeless tobacco. He reports that he does not drink alcohol and does not use drugs.   Family History:  The patient's family history includes Coronary artery disease in his father; Dementia in his mother; Diabetes type II in an other family member; Heart disease in his brother; Pancreatic cancer in his brother; Stroke in his brother.    ROS:  Please see the history of present illness. All other systems are reviewed and  Negative to the above problem except as noted.    PHYSICAL EXAM: VS:  BP (!) 106/56   Pulse 76   Ht 5\' 8"  (1.727 m)   Wt 267 lb (121.1 kg)   SpO2 95%   BMI  40.60 kg/m   GEN: Morbidly obese 70 yo  in no acute distress  HEENT: normal  Neck: no JVD, carotid bruits Cardiac: RRR; no murmurs, No LE edema  Respiratory:  clear to auscultation bilaterally, GI: soft, nontender, nondistended, + BS  MS: no deformity Moving all extremities   Skin: warm and dry, no rash Neuro:  Strength and sensation are intact Psych: euthymic mood, full affect   EKG:  EKG is not ordered today.   Lipid Panel    Component Value Date/Time   CHOL 87 (L) 09/16/2021 0718   TRIG 44 09/16/2021 0718   HDL 39 (L) 09/16/2021 0718   CHOLHDL 2.2 09/16/2021 0718   CHOLHDL 4.8 CALC 03/29/2008 1401   VLDL 12 03/29/2008 1401   LDLCALC 36 09/16/2021 0718      Wt Readings from Last 3 Encounters:  09/30/21 267 lb (121.1 kg)  07/16/21 264 lb 9.6 oz (120 kg)  01/24/21 260 lb (117.9 kg)      ASSESSMENT AND PLAN:  1  CAD   Pt had CT angio   Minmal CAD   HE is asymptomatic Follow   Risk factor modification   Follow   2 DIzzienss   BP is a little low   Stop HCTZ (patient notes that it has been recalled)  Decareed Micardis to 1/2   Follow BP  3  HL  Great control  LDL 36  Back down to 5 mg per day  4  Obesity   Reviewed diet with pt  Cut back on carbs  Keto type.     Current medicines are reviewed at length with the patient today.  The patient does not have concerns regarding medicines.  Signed, Dorris Carnes, MD  09/30/2021 11:48 AM    [facility], [place], [place]  [address] Phone: [phone]; Fax: [phone]

## 2021-09-30 NOTE — Patient Instructions (Signed)
Medication Instructions:   STOP HCTZ  DECREASE Crestor one half tablet by mouth ( 5 mg) daily  DECREASE Micardis one half tablet by mouth ( 20 mg) daily.  *If you need a refill on your cardiac medications before your next appointment, please call your pharmacy*   Lab Work:  -NONE  If you have labs (blood work) drawn today and your tests are completely normal, you will receive your results only by: [place] (if you have MyChart) OR A paper copy in the mail If you have any lab test that is abnormal or we need to change your treatment, we will call you to review the results.   Testing/Procedures:  -NONE   Follow-Up: At [facility], you and your health needs are our priority.  As part of our continuing mission to provide you with exceptional heart care, we have created designated Provider Care Teams.  These Care Teams include your primary Cardiologist (physician) and Advanced Practice Providers (APPs -  Physician Assistants and Nurse Practitioners) who all work together to provide you with the care you need, when you need it.  We recommend signing up for the patient portal called "MyChart".  Sign up information is provided on this After Visit Summary.  MyChart is used to connect with patients for Virtual Visits (Telemedicine).  Patients are able to view lab/test results, encounter notes, upcoming appointments, etc.  Non-urgent messages can be sent to your provider as well.   To learn more about what you can do with MyChart, go to [email or web address].    Your next appointment:   4 month(s)  The format for your next appointment:   In Person  Provider:   Dorris Carnes, MD   Other Instructions  Please send in some blood pressure readings in mychart for Dr.Ross to review.

## 2021-10-12 DIAGNOSIS — B349 Viral infection, unspecified: Secondary | ICD-10-CM | POA: Diagnosis not present

## 2021-10-12 DIAGNOSIS — R059 Cough, unspecified: Secondary | ICD-10-CM | POA: Diagnosis not present

## 2021-10-12 DIAGNOSIS — U071 COVID-19: Secondary | ICD-10-CM | POA: Diagnosis not present

## 2021-10-12 DIAGNOSIS — R509 Fever, unspecified: Secondary | ICD-10-CM | POA: Diagnosis not present

## 2021-10-21 DIAGNOSIS — I10 Essential (primary) hypertension: Secondary | ICD-10-CM | POA: Diagnosis not present

## 2021-10-21 DIAGNOSIS — J449 Chronic obstructive pulmonary disease, unspecified: Secondary | ICD-10-CM | POA: Diagnosis not present

## 2021-10-21 DIAGNOSIS — K219 Gastro-esophageal reflux disease without esophagitis: Secondary | ICD-10-CM | POA: Diagnosis not present

## 2021-10-21 DIAGNOSIS — I2510 Atherosclerotic heart disease of native coronary artery without angina pectoris: Secondary | ICD-10-CM | POA: Diagnosis not present

## 2021-10-21 DIAGNOSIS — E7800 Pure hypercholesterolemia, unspecified: Secondary | ICD-10-CM | POA: Diagnosis not present

## 2021-10-31 ENCOUNTER — Other Ambulatory Visit (HOSPITAL_COMMUNITY): Payer: Self-pay

## 2021-10-31 MED FILL — Pantoprazole Sodium EC Tab 40 MG (Base Equiv): ORAL | 90 days supply | Qty: 90 | Fill #2 | Status: AC

## 2021-10-31 MED FILL — Budesonide-Formoterol Fumarate Dihyd Aerosol 80-4.5 MCG/ACT: RESPIRATORY_TRACT | 90 days supply | Qty: 30.6 | Fill #2 | Status: CN

## 2021-10-31 MED FILL — Tamsulosin HCl Cap 0.4 MG: ORAL | 90 days supply | Qty: 90 | Fill #2 | Status: AC

## 2021-10-31 MED FILL — Budesonide-Formoterol Fumarate Dihyd Aerosol 80-4.5 MCG/ACT: RESPIRATORY_TRACT | 30 days supply | Qty: 10.2 | Fill #2 | Status: AC

## 2021-11-05 ENCOUNTER — Telehealth: Payer: Self-pay | Admitting: Internal Medicine

## 2021-11-05 MED ORDER — ROSUVASTATIN CALCIUM 10 MG PO TABS: 5.0000 mg | ORAL_TABLET | Freq: Every day | ORAL | 3 refills | Status: DC

## 2021-11-05 NOTE — Telephone Encounter (Signed)
Pt's medication was sent to pt's pharmacy as requested. Confirmation received.  °

## 2021-11-05 NOTE — Telephone Encounter (Signed)
*  STAT* If patient is at the pharmacy, call can be transferred to refill team.   1. Which medications need to be refilled? (please list name of each medication and dose if known) rosuvastatin (CRESTOR) 5mg   2. Which pharmacy/location (including street and city if local pharmacy) is medication to be sent to? [id number] [place], [place] - [address] AT [place] Phone:  [phone]         3. Do they need a 30 day or 90 day supply? 90 day

## 2021-11-06 MED ORDER — ROSUVASTATIN CALCIUM 5 MG PO TABS
5.0000 mg | ORAL_TABLET | Freq: Every day | ORAL | 2 refills | Status: DC
Start: 2021-11-06 — End: 2022-12-18
  Filled 2022-01-24 – 2022-03-04 (×2): qty 90, 90d supply, fill #0
  Filled 2022-05-26: qty 90, 90d supply, fill #1
  Filled 2022-08-28: qty 90, 90d supply, fill #2

## 2021-11-06 NOTE — Telephone Encounter (Signed)
Pt c/o medication issue:  1. Name of Medication: rosuvastatin (CRESTOR) 10 MG tablet  2. How are you currently taking this medication (dosage and times per day)? Take 0.5 tablets (5 mg total) by mouth daily.  3. Are you having a reaction (difficulty breathing--STAT)? no  4. What is your medication issue? Calling to say that he patient want the tablet to already be 5mg . Patient dont want to have to cut it in half because he is worried its not always half. Can a knew prescription be written and [facility]

## 2021-11-06 NOTE — Addendum Note (Signed)
Addended by: Loren Racer on: 11/06/2021 04:22 PM   Modules accepted: Orders

## 2021-11-06 NOTE — Telephone Encounter (Signed)
Upgraded prescription sent to pharmacy

## 2021-12-02 ENCOUNTER — Other Ambulatory Visit (HOSPITAL_COMMUNITY): Payer: Self-pay

## 2021-12-02 MED FILL — Budesonide-Formoterol Fumarate Dihyd Aerosol 80-4.5 MCG/ACT: RESPIRATORY_TRACT | 30 days supply | Qty: 10.2 | Fill #3 | Status: AC

## 2021-12-10 ENCOUNTER — Other Ambulatory Visit: Payer: Self-pay | Admitting: Internal Medicine

## 2021-12-10 ENCOUNTER — Encounter: Payer: Self-pay | Admitting: Internal Medicine

## 2021-12-16 ENCOUNTER — Other Ambulatory Visit (HOSPITAL_COMMUNITY): Payer: Self-pay

## 2022-01-01 ENCOUNTER — Other Ambulatory Visit (HOSPITAL_COMMUNITY): Payer: Self-pay

## 2022-01-01 MED FILL — Budesonide-Formoterol Fumarate Dihyd Aerosol 80-4.5 MCG/ACT: RESPIRATORY_TRACT | 30 days supply | Qty: 10.2 | Fill #4 | Status: AC

## 2022-01-22 ENCOUNTER — Other Ambulatory Visit (HOSPITAL_COMMUNITY): Payer: Self-pay

## 2022-01-22 DIAGNOSIS — R7301 Impaired fasting glucose: Secondary | ICD-10-CM | POA: Diagnosis not present

## 2022-01-22 DIAGNOSIS — K219 Gastro-esophageal reflux disease without esophagitis: Secondary | ICD-10-CM | POA: Diagnosis not present

## 2022-01-22 DIAGNOSIS — I10 Essential (primary) hypertension: Secondary | ICD-10-CM | POA: Diagnosis not present

## 2022-01-22 DIAGNOSIS — J449 Chronic obstructive pulmonary disease, unspecified: Secondary | ICD-10-CM | POA: Diagnosis not present

## 2022-01-22 DIAGNOSIS — Z86010 Personal history of colonic polyps: Secondary | ICD-10-CM | POA: Diagnosis not present

## 2022-01-22 DIAGNOSIS — Z0000 Encounter for general adult medical examination without abnormal findings: Secondary | ICD-10-CM | POA: Diagnosis not present

## 2022-01-22 DIAGNOSIS — Z1389 Encounter for screening for other disorder: Secondary | ICD-10-CM | POA: Diagnosis not present

## 2022-01-22 DIAGNOSIS — I700 Atherosclerosis of aorta: Secondary | ICD-10-CM | POA: Diagnosis not present

## 2022-01-22 DIAGNOSIS — I2510 Atherosclerotic heart disease of native coronary artery without angina pectoris: Secondary | ICD-10-CM | POA: Diagnosis not present

## 2022-01-22 MED ORDER — PANTOPRAZOLE SODIUM 40 MG PO TBEC
40.0000 mg | DELAYED_RELEASE_TABLET | Freq: Every day | ORAL | 3 refills | Status: DC
  Filled 2022-01-22: qty 90, 90d supply, fill #0
  Filled 2022-04-21: qty 90, 90d supply, fill #1
  Filled 2022-07-29: qty 90, 90d supply, fill #2
  Filled 2022-10-29: qty 90, 90d supply, fill #3

## 2022-01-22 MED ORDER — TELMISARTAN 20 MG PO TABS
20.0000 mg | ORAL_TABLET | Freq: Every day | ORAL | 3 refills | Status: DC
  Filled 2022-01-22 – 2022-01-23 (×2): qty 90, 90d supply, fill #0
  Filled 2022-04-21: qty 90, 90d supply, fill #1
  Filled 2022-07-29: qty 90, 90d supply, fill #2
  Filled 2022-10-29: qty 90, 90d supply, fill #3

## 2022-01-22 MED ORDER — TAMSULOSIN HCL 0.4 MG PO CAPS
0.4000 mg | ORAL_CAPSULE | Freq: Every day | ORAL | 3 refills | Status: DC
  Filled 2022-01-22: qty 90, 90d supply, fill #0
  Filled 2022-04-21: qty 90, 90d supply, fill #1
  Filled 2022-07-29: qty 90, 90d supply, fill #2
  Filled 2022-10-29: qty 90, 90d supply, fill #3

## 2022-01-22 MED ORDER — BUDESONIDE-FORMOTEROL FUMARATE 80-4.5 MCG/ACT IN AERO
2.0000 | INHALATION_SPRAY | Freq: Two times a day (BID) | RESPIRATORY_TRACT | 3 refills | Status: DC
  Filled 2022-01-22: qty 30.6, 90d supply, fill #0
  Filled 2022-04-21: qty 30.6, 90d supply, fill #1
  Filled 2022-07-29: qty 30.6, 90d supply, fill #2
  Filled 2022-10-29: qty 30.6, 90d supply, fill #3
  Filled 2022-10-31: qty 10.2, 30d supply, fill #3
  Filled 2022-12-18: qty 10.2, 30d supply, fill #4
  Filled 2023-01-12: qty 10.2, 30d supply, fill #5

## 2022-01-23 ENCOUNTER — Other Ambulatory Visit (HOSPITAL_COMMUNITY): Payer: Self-pay

## 2022-01-24 ENCOUNTER — Other Ambulatory Visit (HOSPITAL_COMMUNITY): Payer: Self-pay

## 2022-02-18 ENCOUNTER — Ambulatory Visit: Payer: Medicare Other | Admitting: Internal Medicine

## 2022-03-04 ENCOUNTER — Other Ambulatory Visit (HOSPITAL_COMMUNITY): Payer: Self-pay

## 2022-04-08 ENCOUNTER — Inpatient Hospital Stay (HOSPITAL_COMMUNITY)
Admission: EM | Admit: 2022-04-08 | Discharge: 2022-04-11 | DRG: 389 | Disposition: A | Discharge status: Home / Self Care | Payer: Medicare Other | Attending: Internal Medicine | Admitting: Internal Medicine

## 2022-04-08 ENCOUNTER — Other Ambulatory Visit: Payer: Self-pay

## 2022-04-08 ENCOUNTER — Encounter (HOSPITAL_COMMUNITY): Payer: Self-pay

## 2022-04-08 ENCOUNTER — Emergency Department (HOSPITAL_COMMUNITY): Payer: Medicare Other

## 2022-04-08 DIAGNOSIS — K219 Gastro-esophageal reflux disease without esophagitis: Secondary | ICD-10-CM | POA: Diagnosis present

## 2022-04-08 DIAGNOSIS — I10 Essential (primary) hypertension: Secondary | ICD-10-CM | POA: Diagnosis not present

## 2022-04-08 DIAGNOSIS — Z4682 Encounter for fitting and adjustment of non-vascular catheter: Secondary | ICD-10-CM | POA: Diagnosis not present

## 2022-04-08 DIAGNOSIS — Z8719 Personal history of other diseases of the digestive system: Secondary | ICD-10-CM

## 2022-04-08 DIAGNOSIS — E782 Mixed hyperlipidemia: Secondary | ICD-10-CM | POA: Diagnosis present

## 2022-04-08 DIAGNOSIS — Z6841 Body Mass Index (BMI) 40.0 and over, adult: Secondary | ICD-10-CM | POA: Diagnosis not present

## 2022-04-08 DIAGNOSIS — J449 Chronic obstructive pulmonary disease, unspecified: Secondary | ICD-10-CM | POA: Diagnosis not present

## 2022-04-08 DIAGNOSIS — Z8249 Family history of ischemic heart disease and other diseases of the circulatory system: Secondary | ICD-10-CM

## 2022-04-08 DIAGNOSIS — Z885 Allergy status to narcotic agent status: Secondary | ICD-10-CM | POA: Diagnosis not present

## 2022-04-08 DIAGNOSIS — K565 Intestinal adhesions [bands], unspecified as to partial versus complete obstruction: Secondary | ICD-10-CM | POA: Diagnosis not present

## 2022-04-08 DIAGNOSIS — Z87891 Personal history of nicotine dependence: Secondary | ICD-10-CM | POA: Diagnosis not present

## 2022-04-08 DIAGNOSIS — Z7982 Long term (current) use of aspirin: Secondary | ICD-10-CM | POA: Diagnosis not present

## 2022-04-08 DIAGNOSIS — Z981 Arthrodesis status: Secondary | ICD-10-CM

## 2022-04-08 DIAGNOSIS — K56609 Unspecified intestinal obstruction, unspecified as to partial versus complete obstruction: Secondary | ICD-10-CM | POA: Diagnosis not present

## 2022-04-08 DIAGNOSIS — E785 Hyperlipidemia, unspecified: Secondary | ICD-10-CM | POA: Diagnosis not present

## 2022-04-08 DIAGNOSIS — Z888 Allergy status to other drugs, medicaments and biological substances status: Secondary | ICD-10-CM

## 2022-04-08 DIAGNOSIS — K6389 Other specified diseases of intestine: Secondary | ICD-10-CM | POA: Diagnosis not present

## 2022-04-08 DIAGNOSIS — I7143 Infrarenal abdominal aortic aneurysm, without rupture: Secondary | ICD-10-CM | POA: Diagnosis present

## 2022-04-08 DIAGNOSIS — Z7951 Long term (current) use of inhaled steroids: Secondary | ICD-10-CM | POA: Diagnosis not present

## 2022-04-08 DIAGNOSIS — Z87892 Personal history of anaphylaxis: Secondary | ICD-10-CM | POA: Diagnosis not present

## 2022-04-08 DIAGNOSIS — Z79899 Other long term (current) drug therapy: Secondary | ICD-10-CM | POA: Diagnosis not present

## 2022-04-08 DIAGNOSIS — K5939 Other megacolon: Secondary | ICD-10-CM | POA: Diagnosis not present

## 2022-04-08 DIAGNOSIS — N281 Cyst of kidney, acquired: Secondary | ICD-10-CM | POA: Diagnosis not present

## 2022-04-08 DIAGNOSIS — N400 Enlarged prostate without lower urinary tract symptoms: Secondary | ICD-10-CM | POA: Diagnosis present

## 2022-04-08 DIAGNOSIS — K3189 Other diseases of stomach and duodenum: Secondary | ICD-10-CM | POA: Diagnosis not present

## 2022-04-08 DIAGNOSIS — K5669 Other partial intestinal obstruction: Secondary | ICD-10-CM | POA: Diagnosis not present

## 2022-04-08 LAB — CBC
HCT: 44 % (ref 39.0–52.0)
Hemoglobin: 14.8 g/dL (ref 13.0–17.0)
MCH: 32.2 pg (ref 26.0–34.0)
MCHC: 33.6 g/dL (ref 30.0–36.0)
MCV: 95.7 fL (ref 80.0–100.0)
Platelets: 216 10*3/uL (ref 150–400)
RBC: 4.6 MIL/uL (ref 4.22–5.81)
RDW: 13.4 % (ref 11.5–15.5)
WBC: 10.7 10*3/uL — ABNORMAL HIGH (ref 4.0–10.5)
nRBC: 0 % (ref 0.0–0.2)

## 2022-04-08 LAB — COMPREHENSIVE METABOLIC PANEL
ALT: 29 U/L (ref 0–44)
AST: 35 U/L (ref 15–41)
Albumin: 4.1 g/dL (ref 3.5–5.0)
Alkaline Phosphatase: 67 U/L (ref 38–126)
Anion gap: 8 (ref 5–15)
BUN: 18 mg/dL (ref 8–23)
CO2: 23 mmol/L (ref 22–32)
Calcium: 8.9 mg/dL (ref 8.9–10.3)
Chloride: 104 mmol/L (ref 98–111)
Creatinine, Ser: 1.21 mg/dL (ref 0.61–1.24)
GFR, Estimated: >60 mL/min (ref >60)
Glucose, Bld: 112 mg/dL — ABNORMAL HIGH (ref 70–99)
Potassium: 4.4 mmol/L (ref 3.5–5.1)
Sodium: 135 mmol/L (ref 135–145)
Total Bilirubin: 1.6 mg/dL — ABNORMAL HIGH (ref 0.3–1.2)
Total Protein: 7.5 g/dL (ref 6.5–8.1)

## 2022-04-08 LAB — LIPASE, BLOOD: Lipase: 26 U/L (ref 11–51)

## 2022-04-08 MED ORDER — ONDANSETRON 4 MG PO TBDP
4.0000 mg | ORAL_TABLET | Freq: Once | ORAL | Status: AC
  Administered 2022-04-08: 4 mg via ORAL
  Filled 2022-04-08: qty 1

## 2022-04-08 MED ORDER — IOHEXOL 300 MG/ML  SOLN
100.0000 mL | Freq: Once | INTRAMUSCULAR | Status: AC | PRN
  Administered 2022-04-08: 100 mL via INTRAVENOUS

## 2022-04-08 NOTE — ED Notes (Signed)
Pt in CT will update vitals when return ?

## 2022-04-08 NOTE — ED Provider Triage Note (Signed)
Emergency Medicine Provider Triage Evaluation Note ? ?Shane Clark , a 71 y.o. male  was evaluated in triage.  Pt complains of abd pain nausea vomiting and constipation ? ?Hx of SBOs hx of hernia repair x1 w mesh ?SBOs x5 ? ? ?Review of Systems  ?Positive: NV abd pain ?Negative: Fever  ? ?Physical Exam  ?BP 134/72   Pulse 91   Temp 98 ?F (36.7 ?C) (Oral)   Resp 16   SpO2 94%  ?Gen:   Awake, uncomfortable ?Resp:  Normal effort  ?MSK:   Moves extremities without difficulty  ?Other:  Diffuse TTP ? ?Medical Decision Making  ?Medically screening exam initiated at 8:10 PM.  Appropriate orders placed.  Shane Clark was informed that the remainder of the evaluation will be completed by another provider, this initial triage assessment does not replace that evaluation, and the importance of remaining in the ED until their evaluation is complete. ? ?Labs, CT, zofran ?  ?Tedd Sias, [place] ?04/08/22 2011 ? ?

## 2022-04-08 NOTE — ED Triage Notes (Signed)
Pt complains of abd pain nausea vomiting and constipation ?  ?Hx of SBOs hx of hernia repair x1 w mesh ?SBOs x5 ?

## 2022-04-09 ENCOUNTER — Inpatient Hospital Stay (HOSPITAL_COMMUNITY): Payer: Medicare Other

## 2022-04-09 ENCOUNTER — Encounter (HOSPITAL_COMMUNITY): Payer: Self-pay | Admitting: Internal Medicine

## 2022-04-09 DIAGNOSIS — E785 Hyperlipidemia, unspecified: Secondary | ICD-10-CM | POA: Diagnosis present

## 2022-04-09 DIAGNOSIS — K565 Intestinal adhesions [bands], unspecified as to partial versus complete obstruction: Secondary | ICD-10-CM | POA: Diagnosis present

## 2022-04-09 DIAGNOSIS — K56609 Unspecified intestinal obstruction, unspecified as to partial versus complete obstruction: Secondary | ICD-10-CM

## 2022-04-09 DIAGNOSIS — E782 Mixed hyperlipidemia: Secondary | ICD-10-CM | POA: Diagnosis present

## 2022-04-09 DIAGNOSIS — K219 Gastro-esophageal reflux disease without esophagitis: Secondary | ICD-10-CM | POA: Diagnosis present

## 2022-04-09 DIAGNOSIS — Z79899 Other long term (current) drug therapy: Secondary | ICD-10-CM | POA: Diagnosis not present

## 2022-04-09 DIAGNOSIS — I10 Essential (primary) hypertension: Secondary | ICD-10-CM | POA: Diagnosis present

## 2022-04-09 DIAGNOSIS — N400 Enlarged prostate without lower urinary tract symptoms: Secondary | ICD-10-CM

## 2022-04-09 DIAGNOSIS — Z888 Allergy status to other drugs, medicaments and biological substances status: Secondary | ICD-10-CM | POA: Diagnosis not present

## 2022-04-09 DIAGNOSIS — Z885 Allergy status to narcotic agent status: Secondary | ICD-10-CM | POA: Diagnosis not present

## 2022-04-09 DIAGNOSIS — J449 Chronic obstructive pulmonary disease, unspecified: Secondary | ICD-10-CM | POA: Diagnosis not present

## 2022-04-09 DIAGNOSIS — Z8719 Personal history of other diseases of the digestive system: Secondary | ICD-10-CM | POA: Diagnosis not present

## 2022-04-09 DIAGNOSIS — I7143 Infrarenal abdominal aortic aneurysm, without rupture: Secondary | ICD-10-CM | POA: Diagnosis present

## 2022-04-09 DIAGNOSIS — Z6841 Body Mass Index (BMI) 40.0 and over, adult: Secondary | ICD-10-CM | POA: Diagnosis not present

## 2022-04-09 DIAGNOSIS — Z981 Arthrodesis status: Secondary | ICD-10-CM | POA: Diagnosis not present

## 2022-04-09 DIAGNOSIS — Z7982 Long term (current) use of aspirin: Secondary | ICD-10-CM | POA: Diagnosis not present

## 2022-04-09 DIAGNOSIS — Z8249 Family history of ischemic heart disease and other diseases of the circulatory system: Secondary | ICD-10-CM | POA: Diagnosis not present

## 2022-04-09 DIAGNOSIS — Z7951 Long term (current) use of inhaled steroids: Secondary | ICD-10-CM | POA: Diagnosis not present

## 2022-04-09 DIAGNOSIS — Z87892 Personal history of anaphylaxis: Secondary | ICD-10-CM | POA: Diagnosis not present

## 2022-04-09 DIAGNOSIS — Z87891 Personal history of nicotine dependence: Secondary | ICD-10-CM | POA: Diagnosis not present

## 2022-04-09 LAB — URINALYSIS, ROUTINE W REFLEX MICROSCOPIC
Bilirubin Urine: NEGATIVE
Glucose, UA: NEGATIVE mg/dL
Hgb urine dipstick: NEGATIVE
Ketones, ur: 5 mg/dL — AB
Nitrite: NEGATIVE
Protein, ur: NEGATIVE mg/dL
Specific Gravity, Urine: >1.046 — ABNORMAL HIGH (ref 1.005–1.030)
pH: 5 (ref 5.0–8.0)

## 2022-04-09 LAB — COMPREHENSIVE METABOLIC PANEL
ALT: 27 U/L (ref 0–44)
AST: 29 U/L (ref 15–41)
Albumin: 3.8 g/dL (ref 3.5–5.0)
Alkaline Phosphatase: 59 U/L (ref 38–126)
Anion gap: 7 (ref 5–15)
BUN: 17 mg/dL (ref 8–23)
CO2: 23 mmol/L (ref 22–32)
Calcium: 8.8 mg/dL — ABNORMAL LOW (ref 8.9–10.3)
Chloride: 105 mmol/L (ref 98–111)
Creatinine, Ser: 1.02 mg/dL (ref 0.61–1.24)
GFR, Estimated: >60 mL/min (ref >60)
Glucose, Bld: 124 mg/dL — ABNORMAL HIGH (ref 70–99)
Potassium: 4.4 mmol/L (ref 3.5–5.1)
Sodium: 135 mmol/L (ref 135–145)
Total Bilirubin: 1.1 mg/dL (ref 0.3–1.2)
Total Protein: 7.4 g/dL (ref 6.5–8.1)

## 2022-04-09 LAB — CBC WITH DIFFERENTIAL/PLATELET
Abs Immature Granulocytes: 0.03 10*3/uL (ref 0.00–0.07)
Basophils Absolute: 0 10*3/uL (ref 0.0–0.1)
Basophils Relative: 0 %
Eosinophils Absolute: 0 10*3/uL (ref 0.0–0.5)
Eosinophils Relative: 0 %
HCT: 42.2 % (ref 39.0–52.0)
Hemoglobin: 14.4 g/dL (ref 13.0–17.0)
Immature Granulocytes: 0 %
Lymphocytes Relative: 8 %
Lymphs Abs: 0.7 10*3/uL (ref 0.7–4.0)
MCH: 32.6 pg (ref 26.0–34.0)
MCHC: 34.1 g/dL (ref 30.0–36.0)
MCV: 95.5 fL (ref 80.0–100.0)
Monocytes Absolute: 0.5 10*3/uL (ref 0.1–1.0)
Monocytes Relative: 7 %
Neutro Abs: 6.9 10*3/uL (ref 1.7–7.7)
Neutrophils Relative %: 85 %
Platelets: 209 10*3/uL (ref 150–400)
RBC: 4.42 MIL/uL (ref 4.22–5.81)
RDW: 13.3 % (ref 11.5–15.5)
WBC: 8.1 10*3/uL (ref 4.0–10.5)
nRBC: 0 % (ref 0.0–0.2)

## 2022-04-09 LAB — MAGNESIUM: Magnesium: 1.8 mg/dL (ref 1.7–2.4)

## 2022-04-09 MED ORDER — MORPHINE SULFATE (PF) 4 MG/ML IV SOLN: 4.0000 mg | INTRAVENOUS | Status: DC | PRN

## 2022-04-09 MED ORDER — ALBUTEROL SULFATE (2.5 MG/3ML) 0.083% IN NEBU: 2.5000 mg | INHALATION_SOLUTION | Freq: Four times a day (QID) | RESPIRATORY_TRACT | Status: DC | PRN

## 2022-04-09 MED ORDER — HYDROMORPHONE HCL 1 MG/ML IJ SOLN
0.5000 mg | INTRAMUSCULAR | Status: DC | PRN
  Administered 2022-04-09 (×2): 0.5 mg via INTRAVENOUS
  Filled 2022-04-09 (×2): qty 0.5

## 2022-04-09 MED ORDER — PANTOPRAZOLE SODIUM 40 MG IV SOLR
40.0000 mg | INTRAVENOUS | Status: DC
Start: 2022-04-09 — End: 2022-04-11
  Administered 2022-04-09 – 2022-04-11 (×3): 40 mg via INTRAVENOUS
  Filled 2022-04-09 (×3): qty 10

## 2022-04-09 MED ORDER — PROCHLORPERAZINE EDISYLATE 10 MG/2ML IJ SOLN
10.0000 mg | Freq: Four times a day (QID) | INTRAMUSCULAR | Status: DC | PRN
  Administered 2022-04-09: 10 mg via INTRAVENOUS
  Filled 2022-04-09: qty 2

## 2022-04-09 MED ORDER — MORPHINE SULFATE (PF) 2 MG/ML IV SOLN: 2.0000 mg | INTRAVENOUS | Status: DC | PRN

## 2022-04-09 MED ORDER — HYDRALAZINE HCL 20 MG/ML IJ SOLN: 10.0000 mg | Freq: Four times a day (QID) | INTRAMUSCULAR | Status: DC | PRN

## 2022-04-09 MED ORDER — MOMETASONE FURO-FORMOTEROL FUM 100-5 MCG/ACT IN AERO
2.0000 | INHALATION_SPRAY | Freq: Two times a day (BID) | RESPIRATORY_TRACT | Status: DC
Start: 2022-04-09 — End: 2022-04-11
  Administered 2022-04-10: 2 via RESPIRATORY_TRACT
  Filled 2022-04-09 (×2): qty 8.8

## 2022-04-09 MED ORDER — SODIUM CHLORIDE 0.9 % IV SOLN: INTRAVENOUS | Status: DC

## 2022-04-09 MED ORDER — DIATRIZOATE MEGLUMINE & SODIUM 66-10 % PO SOLN
90.0000 mL | Freq: Once | ORAL | Status: AC
  Administered 2022-04-09: 90 mL via NASOGASTRIC
  Filled 2022-04-09: qty 90

## 2022-04-09 MED ORDER — NALOXONE HCL 0.4 MG/ML IJ SOLN: 0.4000 mg | INTRAMUSCULAR | Status: DC | PRN

## 2022-04-09 MED ORDER — MORPHINE SULFATE (PF) 4 MG/ML IV SOLN
4.0000 mg | Freq: Once | INTRAVENOUS | Status: AC
  Administered 2022-04-09: 4 mg via INTRAVENOUS
  Filled 2022-04-09: qty 1

## 2022-04-09 MED ORDER — ONDANSETRON HCL 4 MG/2ML IJ SOLN
4.0000 mg | Freq: Once | INTRAMUSCULAR | Status: AC
  Administered 2022-04-09: 4 mg via INTRAVENOUS
  Filled 2022-04-09: qty 2

## 2022-04-09 MED ORDER — ONDANSETRON HCL 4 MG/2ML IJ SOLN
4.0000 mg | Freq: Four times a day (QID) | INTRAMUSCULAR | Status: DC | PRN
Start: 2022-04-09 — End: 2022-04-11
  Administered 2022-04-09: 4 mg via INTRAVENOUS
  Filled 2022-04-09: qty 2

## 2022-04-09 NOTE — Assessment & Plan Note (Signed)
? ?#)   Mechanical small bowel striction: Diagnosis on the basis of 1 day of progressive abdominal discomfort associated with hypoactive bowel sounds and nausea/vomiting in similar fashion to that which she is experienced at times of previous small bowel structure in the context of abdominal surgical history, as defined above, increasing suspicion for underlying postoperative adhesions as contributory factor, with CT abdomen/pelvis confirming small bowel structure in the absence of evidence of bowel perforation or abscess. ? ?Plan: N.p.o. we will attempt to place NGT.  IV fluids.  Prn IV Dilaudid.  Prn IV Zofran.  Repeat CMP and CBC in the morning. ? ? ?

## 2022-04-09 NOTE — Assessment & Plan Note (Signed)
? ?#)   BPH: Documented history of such, Aflaxen outpatient. ? ?Plan: In the setting of n.p.o. status, will hold home Flomax.  Monitor strict I's and O's diabetes.  Repeat CMP in the morning. ? ? ? ?

## 2022-04-09 NOTE — ED Notes (Signed)
This RN discussed with provider concerns patient had with having NG tube inserted. Patient verbalized to this RN the multiple difficult NG tube insertions he has had in the past and that he was only able to have one placed under fluoroscopy. When this RN verbalized concerns of attempting to insert NG tube to provider [place] provider stated that we have to try and attempt and another seasoned nurse should attempt.  ?

## 2022-04-09 NOTE — Progress Notes (Signed)
Attempted NGT in both sides of nose ?

## 2022-04-09 NOTE — Progress Notes (Signed)
Report given to jasmin RN ?

## 2022-04-09 NOTE — ED Notes (Signed)
This RN attempted NG placement.  Resistance was met on both nostrils and patient requested this RN to stop with advancement.  Placement was stopped. ?

## 2022-04-09 NOTE — Assessment & Plan Note (Signed)
? ?#)   GERD: On Protonix as an outpatient. ? ? ?Plan: In the setting of current n.p.o. status, will transiently transition oral Protonix to IV daily dosing. ? ? ?

## 2022-04-09 NOTE — H&P (Signed)
?History and Physical  ? ? ?PLEASE NOTE THAT DRAGON DICTATION SOFTWARE WAS USED IN THE CONSTRUCTION OF THIS NOTE. ? ? ?Shane Clark GPQ:[id number] DOB: 11/29/1951 DOA: 04/08/2022 ? ?PCP: Shane Arabian, MD  ?Patient coming from: home  ? ?I have personally briefly reviewed patient's old medical records in [place] ? ?Chief Complaint: 1 day of abdominal discomfort ? ?HPI: Shane Shane is a 71 y.o. male with medical history significant for multiple previous small bowel obstructions, BPH, COPD, GERD, hypertension, hyperlipidemia who is admitted to [facility] on 04/08/2022 with ? after presenting from home to [facility] ED complaining of abdominal pain.  ? ?The patient reports 1 day of progressive generalized abdominal discomfort, slightly more pronounced on the left.  Associated with nausea resulting in 2-3 episodes of nausea and bloody emesis.  He reports a history of multiple previous small bowel obstructions in the context of an abdominal surgical history includes multiple hernia repairs.  He reports that his small bowel obstructions have previously resolved spontaneously, but notes that there is typically difficulty in placing an NG tube, noting that his most recent 2 hospitalizations for small bowel obstruction resulted in inability to place NG tube.  However, in spite of this history, the patient reports that he is amenable to attempts at placement of NGT.  Denies any associated subjective fever, chills rigors, generalized myalgias.  Most recent bowel movement occurred a day ago.  No recent melena or hematochezia. ? ? ? ?ED Course:  ?Vital signs in the ED were notable for the following: Afebrile; systolic blood pressures in the 120s to 140s; respiratory rate 18-20, oxygen saturation 93 to 96% on room air. ? ?Labs were notable for the following: CMP notable for liver enzymes that were within normal limits.  CBC notable for white blood count 10,700 with hemoglobin 14.8.  Urinalysis showed 6-10 white blood  cells, rare bacteria. ? ?Imaging and additional notable ED work-up: CT abdomen/pelvis with contrast showed mid small bowel obstruction without any evidence of obstructing lesion, nor any evidence of bowel abscess,/perforation. ? ?While in the ED, the following were administered: Morphine 4 mg IV x1. ? ?Subsequently, the patient was admitted for further evaluation and management of presenting small bowel obstruction. ? ? ? ? ?Review of Systems: As per HPI otherwise 10 point review of systems negative.  ? ?Past Medical History:  ?Diagnosis Date  ? Allergy   ? Arthritis   ? Asthma   ? BPH (benign prostatic hyperplasia)   ? Chronic back pain   ? "all over"  ? COPD (chronic obstructive pulmonary disease) ([place])   ? GERD (gastroesophageal reflux disease)   ? Hx of adenomatous colonic polyps 03/22/2016  ? Hypertension   ? Influenza 11/20/2011  ? Sepsis ([place]) 02/05/2016  ? ? ?Past Surgical History:  ?Procedure Laterality Date  ? ABDOMINAL HERNIA REPAIR    ? ANTERIOR CERVICAL DECOMP/DISCECTOMY FUSION    ? C6-8  ? APPENDECTOMY  1960s  ? BACK SURGERY    ? COLONOSCOPY    ? HERNIA REPAIR    ? INSERTION DISTRACTION DEVICE LUMBAR POSTERIOR SPINOUS PROCESS    ? KNEE ARTHROSCOPY Left   ? SHOULDER SURGERY Left 02/11/2017  ? SMALL BOWEL REPAIR    ? ? ?Social History: ? reports that he has quit smoking. His smoking use included cigarettes. He has a 2.40 pack-year smoking history. He has quit using smokeless tobacco. He reports that he does not drink alcohol and does not use drugs. ? ? ?Allergies  ?  Allergen Reactions  ? Oxycodone-Acetaminophen Anaphylaxis  ? Advair Diskus [Fluticasone-Salmeterol]   ?  unknown  ? Pentazocine Lactate Nausea Only and Other (See Comments)  ?  Stopped breathing "talwin"  ? ? ?Family History  ?Problem Relation Age of Onset  ? Coronary artery disease Father   ? Pancreatic cancer Brother   ? Stroke Brother   ? Heart disease Brother   ? Dementia Mother   ? Diabetes type II Other   ? Colon cancer Neg Hx   ? Colon  polyps Neg Hx   ? Rectal cancer Neg Hx   ? Stomach cancer Neg Hx   ? ? ?Family history reviewed and not pertinent  ? ? ?Prior to Admission medications   ?Medication Sig Start Date End Date Taking? Authorizing Provider  ?albuterol (PROVENTIL HFA;VENTOLIN HFA) 108 (90 BASE) MCG/ACT inhaler Inhale 2 puffs into the lungs every 6 (six) hours as needed for wheezing or shortness of breath.     [provider]  ?albuterol (VENTOLIN HFA) 108 (90 Base) MCG/ACT inhaler Inhale 1-2 puffs into the lungs every 4 (four) hours as needed. ?Patient not taking: Reported on 09/30/2021 05/03/21     ?aspirin EC 81 MG tablet Take 81 mg by mouth daily. Swallow whole.    [provider]  ?azelastine (ASTELIN) 0.1 % nasal spray Place 2 sprays into both nostrils 2 (two) times daily as needed for congestion. ?Patient not taking: Reported on 07/16/2021 01/06/20   [provider]  ?budesonide-formoterol (SYMBICORT) 80-4.5 MCG/ACT inhaler Inhale 2 puffs into the lungs 2 (two) times daily.    [provider]  ?budesonide-formoterol (SYMBICORT) 80-4.5 MCG/ACT inhaler Inhale 2 puffs into the lungs 2 (two) times daily. 01/22/22     ?loratadine (CLARITIN) 10 MG tablet Take 10 mg by mouth daily as needed for allergies.     [provider]  ?metoprolol tartrate (LOPRESSOR) 100 MG tablet Take one tablet by mouth 2 hours before your CT 07/16/21   Fay Records, MD  ?Multiple Vitamins-Minerals (ZINC PO) Take 1 tablet by mouth daily. CD ZINC    [provider]  ?nitroGLYCERIN (NITROSTAT) 0.4 MG SL tablet Place 1 tablet (0.4 mg total) under the tongue as needed. 07/16/21   Fay Records, MD  ?pantoprazole (PROTONIX) 40 MG tablet Take 40 mg by mouth daily.    [provider]  ?pantoprazole (PROTONIX) 40 MG tablet Take 1 tablet (40 mg total) by mouth daily for acid reflux 01/22/22     ?rosuvastatin (CRESTOR) 5 MG tablet Take 1 tablet (5 mg total) by mouth daily. 11/06/21   Fay Records, MD  ?tamsulosin  (FLOMAX) 0.4 MG CAPS capsule Take 1 capsule (0.4 mg total) by mouth 30 minutes after the same meal each day for urinary symtomps 01/22/22     ?Tamsulosin HCl (FLOMAX) 0.4 MG CAPS Take 0.4 mg by mouth daily. ?Patient not taking: Reported on 09/30/2021    [provider]  ?telmisartan (MICARDIS) 20 MG tablet Take 1 tablet (20 mg total) by mouth daily for blood pressure 01/22/22     ?telmisartan (MICARDIS) 40 MG tablet Take 1 tablet by mouth  once daily for blood pressure 03/14/21     ?telmisartan (MICARDIS) 40 MG tablet Take 0.5 tablets (20 mg total) by mouth daily. 09/30/21   Fay Records, MD  ? ? ? ?Objective  ? ? ?Physical Exam: ?Vitals:  ? 04/09/22 0400 04/09/22 0430 04/09/22 0515 04/09/22 0545  ?BP: (!) 113/53 (!) 118/55 (!) 117/53 Marland Kitchen)  120/52  ?Pulse: 71 69 69 72  ?Resp: '13 14 13 14  '$ ?Temp:      ?TempSrc:      ?SpO2: 90% 93% 93% 93%  ? ? ?General: appears to be stated age; alert, oriented ?Skin: warm, dry, no rash ?Head:  AT/[place] ?Mouth:  Oral mucosa membranes appear dry, normal dentition ?Neck: supple; trachea midline ?Heart:  RRR; did not appreciate any M/R/G ?Lungs: CTAB, did not appreciate any wheezes, rales, or rhonchi ?Abdomen: Hypoactive bowel sounds soft, ND, generalized tenderness to palpation in the absence of any associated guarding, rigidity, or rebound tenderness ?Vascular: 2+ pedal pulses b/l; 2+ radial pulses b/l ?Extremities: no peripheral edema, no muscle wasting ?Neuro: strength and sensation intact in upper and lower extremities b/l ? ? ? ?Labs on Admission: I have personally reviewed following labs and imaging studies ? ?CBC: ?Recent Labs  ?Lab 04/08/22 ?2021 04/09/22 ?0259  ?WBC 10.7* 8.1  ?NEUTROABS  --  6.9  ?HGB 14.8 14.4  ?HCT 44.0 42.2  ?MCV 95.7 95.5  ?PLT 216 209  ? ?Basic Metabolic Panel: ?Recent Labs  ?Lab 04/08/22 ?2021 04/09/22 ?0259  ?NA 135 135  ?K 4.4 4.4  ?CL 104 105  ?CO2 23 23  ?GLUCOSE 112* 124*  ?BUN 18 17  ?CREATININE 1.21 1.02  ?CALCIUM 8.9 8.8*  ?MG  --  1.8   ? ?GFR: ?CrCl cannot be calculated (Unknown ideal weight.). ?Liver Function Tests: ?Recent Labs  ?Lab 04/08/22 ?2021 04/09/22 ?0259  ?AST 35 29  ?ALT 29 27  ?ALKPHOS 67 59  ?BILITOT 1.6* 1.1  ?PROT 7.5 7.4  ?ALBUMIN

## 2022-04-09 NOTE — ED Notes (Signed)
Patient ambulated to restroom with standby assistance. 

## 2022-04-09 NOTE — ED Provider Notes (Signed)
?[name] ?Provider Note ? ? ?CSN: [id number] ?Arrival date & time: 04/08/22  1908 ? ?  ? ?History ? ?Chief Complaint  ?Patient presents with  ? Abdominal Pain  ? ? ?Shane Clark is a 71 y.o. male. ? ?HPI ? ?  ? ?This is a 71 year old male who presents with abdominal pain, nausea, vomiting.  Patient has a history of multiple small bowel obstructions in the past.  He has had a hernia with mesh repair by Dr. Donne Hazel.  Patient states that he got up yesterday morning and had a normal bowel movement.  He drank half a cup of coffee and half a glass of tea and since that time has had nonbilious, nonbloody emesis.  He does state that he was able to keep down some Gatorade but not much.  He tried to drink some more this evening and began to vomit again.  Patient also reports that he had very sharp left upper quadrant pain.  Current pain is 7 out of 10.  Has not taken anything for the pain. ? ?Chart reviewed.  Patient last admitted for an SBO in 2021. ? ?Home Medications ?Prior to Admission medications   ?Medication Sig Start Date End Date Taking? Authorizing Provider  ?albuterol (PROVENTIL HFA;VENTOLIN HFA) 108 (90 BASE) MCG/ACT inhaler Inhale 2 puffs into the lungs every 6 (six) hours as needed for wheezing or shortness of breath.     [provider]  ?albuterol (VENTOLIN HFA) 108 (90 Base) MCG/ACT inhaler Inhale 1-2 puffs into the lungs every 4 (four) hours as needed. ?Patient not taking: Reported on 09/30/2021 05/03/21     ?aspirin EC 81 MG tablet Take 81 mg by mouth daily. Swallow whole.    [provider]  ?azelastine (ASTELIN) 0.1 % nasal spray Place 2 sprays into both nostrils 2 (two) times daily as needed for congestion. ?Patient not taking: Reported on 07/16/2021 01/06/20   [provider]  ?budesonide-formoterol (SYMBICORT) 80-4.5 MCG/ACT inhaler Inhale 2 puffs into the lungs 2 (two) times daily.    [provider]  ?budesonide-formoterol  (SYMBICORT) 80-4.5 MCG/ACT inhaler Inhale 2 puffs into the lungs 2 (two) times daily. 01/22/22     ?loratadine (CLARITIN) 10 MG tablet Take 10 mg by mouth daily as needed for allergies.     [provider]  ?metoprolol tartrate (LOPRESSOR) 100 MG tablet Take one tablet by mouth 2 hours before your CT 07/16/21   Fay Records, MD  ?Multiple Vitamins-Minerals (ZINC PO) Take 1 tablet by mouth daily. CD ZINC    [provider]  ?nitroGLYCERIN (NITROSTAT) 0.4 MG SL tablet Place 1 tablet (0.4 mg total) under the tongue as needed. 07/16/21   Fay Records, MD  ?pantoprazole (PROTONIX) 40 MG tablet Take 40 mg by mouth daily.    [provider]  ?pantoprazole (PROTONIX) 40 MG tablet Take 1 tablet (40 mg total) by mouth daily for acid reflux 01/22/22     ?rosuvastatin (CRESTOR) 5 MG tablet Take 1 tablet (5 mg total) by mouth daily. 11/06/21   Fay Records, MD  ?tamsulosin (FLOMAX) 0.4 MG CAPS capsule Take 1 capsule (0.4 mg total) by mouth 30 minutes after the same meal each day for urinary symtomps 01/22/22     ?Tamsulosin HCl (FLOMAX) 0.4 MG CAPS Take 0.4 mg by mouth daily. ?Patient not taking: Reported on 09/30/2021    [provider]  ?telmisartan (MICARDIS) 20 MG tablet Take 1 tablet (20 mg total) by mouth  daily for blood pressure 01/22/22     ?telmisartan (MICARDIS) 40 MG tablet Take 1 tablet by mouth  once daily for blood pressure 03/14/21     ?telmisartan (MICARDIS) 40 MG tablet Take 0.5 tablets (20 mg total) by mouth daily. 09/30/21   Fay Records, MD  ?   ? ?Allergies    ?Oxycodone-acetaminophen, Advair diskus [fluticasone-salmeterol], and Pentazocine lactate   ? ?Review of Systems   ?Review of Systems  ?Constitutional:  Negative for fever.  ?Gastrointestinal:  Positive for abdominal pain, nausea and vomiting.  ?All other systems reviewed and are negative. ? ?Physical Exam ?Updated Vital Signs ?BP 130/68 (BP Location: Left Arm)   Pulse 88   Temp 98 ?F (36.7 ?C) (Oral)   Resp 20    SpO2 96%  ?Physical Exam ?Vitals and nursing note reviewed.  ?Constitutional:   ?   Appearance: He is well-developed. He is obese. He is not ill-appearing.  ?HENT:  ?   Head: Normocephalic and atraumatic.  ?Eyes:  ?   Pupils: Pupils are equal, round, and reactive to light.  ?Cardiovascular:  ?   Rate and Rhythm: Normal rate and regular rhythm.  ?   Heart sounds: Normal heart sounds. No murmur heard. ?Pulmonary:  ?   Effort: Pulmonary effort is normal. No respiratory distress.  ?   Breath sounds: Normal breath sounds. No wheezing.  ?Abdominal:  ?   General: Bowel sounds are decreased.  ?   Palpations: Abdomen is soft.  ?   Tenderness: There is abdominal tenderness in the epigastric area. There is no guarding or rebound.  ?Musculoskeletal:  ?   Cervical back: Neck supple.  ?Lymphadenopathy:  ?   Cervical: No cervical adenopathy.  ?Skin: ?   General: Skin is warm and dry.  ?Neurological:  ?   Mental Status: He is alert and oriented to person, place, and time.  ?Psychiatric:     ?   Mood and Affect: Mood normal.  ? ? ?ED Results / Procedures / Treatments   ?Labs ?(all labs ordered are listed, but only abnormal results are displayed) ?Labs Reviewed  ?COMPREHENSIVE METABOLIC PANEL - Abnormal; Notable for the following components:  ?    Result Value  ? Glucose, Bld 112 (*)   ? Total Bilirubin 1.6 (*)   ? All other components within normal limits  ?CBC - Abnormal; Notable for the following components:  ? WBC 10.7 (*)   ? All other components within normal limits  ?URINALYSIS, ROUTINE W REFLEX MICROSCOPIC - Abnormal; Notable for the following components:  ? Specific Gravity, Urine >1.046 (*)   ? Ketones, ur 5 (*)   ? Leukocytes,Ua MODERATE (*)   ? Bacteria, UA RARE (*)   ? All other components within normal limits  ?LIPASE, BLOOD  ? ? ?EKG ?None ? ?Radiology ?CT ABDOMEN PELVIS W CONTRAST ? ?Result Date: 04/09/2022 ?CLINICAL DATA:  Abdominal pain, acute, nonlocalized. Nausea, vomiting, constipation EXAM: CT ABDOMEN AND PELVIS  WITH CONTRAST TECHNIQUE: Multidetector CT imaging of the abdomen and pelvis was performed using the standard protocol following bolus administration of intravenous contrast. RADIATION DOSE REDUCTION: This exam was performed according to the departmental dose-optimization program which includes automated exposure control, adjustment of the mA and/or kV according to patient size and/or use of iterative reconstruction technique. CONTRAST:  170m OMNIPAQUE IOHEXOL 300 MG/ML  SOLN COMPARISON:  04/17/2020 FINDINGS: Lower chest: No acute abnormality. Hepatobiliary: No focal liver abnormality is seen. No gallstones, gallbladder wall thickening, or biliary dilatation. Pancreas:  Unremarkable Spleen: Unremarkable Adrenals/Urinary Tract: The adrenal glands are unremarkable. The kidneys are normal in size and position. Simple cortical cyst seen within the upper pole the right kidney. No follow-up imaging is recommended for this lesion. No enhancing intrarenal masses. No hydronephrosis. No intrarenal or ureteral calculi. The bladder is unremarkable. Stomach/Bowel: A mid small bowel obstruction is present with a point of transition seen within the right mid abdomen at axial image # 74/3 and coronal image # 33/6. The proximal small bowel loops appear fluid-filled and dilated distally, the small bowel appears decompressed. Mild gaseous distension of the stomach. Mild scattered diverticulosis is noted of the colon. No superimposed acute inflammatory change. The stomach, small bowel, and large bowel are otherwise unremarkable. The appendix is absent. No free intraperitoneal gas or fluid. Vascular/Lymphatic: Fusiform infrarenal abdominal aortic aneurysm is present with maximal transaxial dimension of 3.0 cm. Superimposed moderate aortoiliac atherosclerotic calcification. No pathologic adenopathy within the abdomen and pelvis. Reproductive: Moderate prostatic enlargement. Seminal vesicles are unremarkable. Other: No abdominal wall  hernia.  Rectum unremarkable. Musculoskeletal: Degenerative changes seen within the lumbar spine. No acute bone abnormality. No lytic or blastic bone lesion. IMPRESSION: Mid small bowel obstruction. An obstructing l

## 2022-04-09 NOTE — Assessment & Plan Note (Signed)
? ?#)   COPD: Documented history of such, in the setting of being a former smoker with outpatient respiratory regimen given consisting of scheduled Symbicort as well as as needed albuterol inhaler.  No clinical evidence to suggest acutely exacerbated COPD. ? ?Plan: Continue Symbicort and as needed albuterol inhaler. ? ?

## 2022-04-09 NOTE — Progress Notes (Signed)
?PROGRESS NOTE ? ? ? ?Shane Clark  QQI:[id number] DOB: Apr 02, 1951 DOA: 04/08/2022 ?PCP: Gaynelle Arabian, MD  ? ? ?Brief Narrative:  ? ?Shane Clark is a 71 y.o. male with past medical history significant for BPH, COPD, GERD, HTN, HLD, history of previous small bowel stricture with multiple hernia repairs and recurrent small bowel obstructions who presents to [facility] ED on 5/9 with progressive generalized abdominal discomfort associated with nausea and vomiting.  Onset 1 day prior.  Reports history of multiple previous small bowel obstructions in the context of multiple hernia repairs in the past.  He reports that his bowel obstructions have previously resolved spontaneously although he typically has difficulty with NG tube placement.  Denies fever/chills, no chest pain, no palpitations, no shortness of breath. ? ?In the ED, temperature 98.0 ?F, HR 91, RR 16, BP 1 02/[birth year]1971, SPO2 94% on room air.  WBC 10.7, hemoglobin 14.,  Platelet 209.  Sodium 135, potassium 4.4, chloride 104, CO2 23, glucose 112, BUN 18, creatinine was 1.21.  AST 35, ALT 29, total bilirubin 1.6.  Lipase 26.  Urinalysis with moderate leukocytes, negative nitrite, rare bacteria, 6-10 WBCs.  CT abdomen/pelvis with mid small bowel obstruction with no obstructing lesion clearly identified but likely underlying adhesions suspected, no free intraperitoneal gas or fluid.  Hospital service consulted for further evaluation management of small bowel obstruction ? ? ? ?Assessment & Plan: ?  ? ?Small bowel obstruction ?Patient presenting with acute onset generalized abdominal discomfort associate with nausea/vomiting.  CT abdomen/pelvis with mid small bowel obstruction with no obstructing lesion clearly identified but likely underlying adhesions suspected, no free intraperitoneal gas or fluid. History of previous small bowel obstructions related to multiple hernia repairs in the past that have responded to conservative measures.   ?--General surgery following,  appreciate assistance ?--NPO ?--NGT to LWIS ?--NS at 122m/h ?--Dilaudid 0.5 mg IV Q2h PRN severe pain ?--Zofran/Compazine as needed nausea/vomiting ? ?COPD ?Stable, on room air.  On Symbicort 2 puffs twice daily outpatient. ?--Continue Dulera 2 puffs twice daily as hospital substitution ? ?HTN ?On telmisartan 20 mg p.o. daily at home. ?--Holding currently due to n.p.o. status ?--Hydralazine '10mg'$  IV q6h PRN SBP >165 or DBP >110 ? ?HLD ?On Crestor 5 mg p.o. daily outpatient. ?--Holding oral medications while n.p.o. ? ?GERD ?On Protonix 40 mg p.o. daily. ?--Protonix 40 mg IV daily while n.p.o. ? ?BPH ?On tamsulosin 0.4 mg daily outpatient. ?--Holding oral medications while n.p.o. with NG tube to low wall suction ?--Bladder scan as needed ? ?Abdominal aortic aneurysm ?CT abdomen/pelvis with 3 cm infrarenal abdominal aortic aneurysm.  Will need follow-up every 3 years outpatient. ? ?Morbid obesity ?Body mass index is 40.02 kg/m?.Marland Kitchen Discussed with patient needs for aggressive lifestyle changes/weight loss as this complicates all facets of care.  Outpatient follow-up with PCP.   ? ? ? ?DVT prophylaxis: SCDs Start: 04/09/22 0225 ? ?  Code Status: Full Code ?Family Communication: No family present at bedside this morning ? ?Disposition Plan:  ?Level of care: Telemetry Medical ?Status is: Inpatient ?Remains inpatient appropriate because: Continues with small bowel obstruction, inserting NG tube today, will need to await return of bowel function and advancement of diet before stable for discharge home, anticipate discharge in 3-4 days unless may require surgical intervention ?  ? ?Consultants:  ?General surgery ? ?Procedures:  ?NG tube placement 5/10 ? ?Antimicrobials:  ?None ? ? ?Subjective: ?Patient seen examined bedside, resting comfortably.  RN present at bedside.  Continues with abdominal pain/distention as  well as nausea.  Reports very small bowel movement last night around 8:30 PM and reports continued flatus.  No other  specific complaints this morning.  Denies headache, no dizziness, no chest pain, no shortness of breath, no current vomiting, no diarrhea, no palpitations, no weakness, no fever/chills/night sweats, no fatigue, no paresthesias.  No acute events overnight per nursing staff. ? ?Objective: ?Vitals:  ? 04/09/22 0630 04/09/22 0826 04/09/22 0844 04/09/22 1304  ?BP: (!) 120/53 128/69  130/65  ?Pulse: 69 72  63  ?Resp: '15 18  16  '$ ?Temp:  97.7 ?F (36.5 ?C)  98 ?F (36.7 ?C)  ?TempSrc:  Oral  Oral  ?SpO2: 92% 97%  95%  ?Weight:   119.4 kg   ?Height:   '5\' 8"'$  (1.727 m)   ? ? ?Intake/Output Summary (Last 24 hours) at 04/09/2022 1458 ?Last data filed at 04/09/2022 1412 ?Gross per 24 hour  ?Intake --  ?Output 300 ml  ?Net -300 ml  ? ?Filed Weights  ? 04/09/22 0844  ?Weight: 119.4 kg  ? ? ?Examination: ? ?Physical Exam: ?GEN: NAD, alert and oriented x 3, obese ?HEENT: NCAT, PERRL, EOMI, sclera clear, MMM ?PULM: CTAB w/o wheezes/crackles, normal respiratory effort, on room air ?CV: RRR w/o M/G/R ?GI: abd soft, mild abdominal distention tenderness to palpation, no appreciable bowel sounds, no R/G/M ?MSK: no peripheral edema, muscle strength globally intact 5/5 bilateral upper/lower extremities ?NEURO: CN II-XII intact, no focal deficits, sensation to light touch intact ?PSYCH: normal mood/affect ?Integumentary: dry/intact, no rashes or wounds ? ? ? ?Data Reviewed: I have personally reviewed following labs and imaging studies ? ?CBC: ?Recent Labs  ?Lab 04/08/22 ?2021 04/09/22 ?0259  ?WBC 10.7* 8.1  ?NEUTROABS  --  6.9  ?HGB 14.8 14.4  ?HCT 44.0 42.2  ?MCV 95.7 95.5  ?PLT 216 209  ? ?Basic Metabolic Panel: ?Recent Labs  ?Lab 04/08/22 ?2021 04/09/22 ?0259  ?NA 135 135  ?K 4.4 4.4  ?CL 104 105  ?CO2 23 23  ?GLUCOSE 112* 124*  ?BUN 18 17  ?CREATININE 1.21 1.02  ?CALCIUM 8.9 8.8*  ?MG  --  1.8  ? ?GFR: ?Estimated Creatinine Clearance: 84.6 mL/min (by C-G formula based on SCr of 1.02 mg/dL). ?Liver Function Tests: ?Recent Labs  ?Lab  04/08/22 ?2021 04/09/22 ?0259  ?AST 35 29  ?ALT 29 27  ?ALKPHOS 67 59  ?BILITOT 1.6* 1.1  ?PROT 7.5 7.4  ?ALBUMIN 4.1 3.8  ? ?Recent Labs  ?Lab 04/08/22 ?2021  ?LIPASE 26  ? ?No results for input(s): AMMONIA in the last 168 hours. ?Coagulation Profile: ?No results for input(s): INR, PROTIME in the last 168 hours. ?Cardiac Enzymes: ?No results for input(s): CKTOTAL, CKMB, CKMBINDEX, TROPONINI in the last 168 hours. ?BNP (last 3 results) ?No results for input(s): PROBNP in the last 8760 hours. ?HbA1C: ?No results for input(s): HGBA1C in the last 72 hours. ?CBG: ?No results for input(s): GLUCAP in the last 168 hours. ?Lipid Profile: ?No results for input(s): CHOL, HDL, LDLCALC, TRIG, CHOLHDL, LDLDIRECT in the last 72 hours. ?Thyroid Function Tests: ?No results for input(s): TSH, T4TOTAL, FREET4, T3FREE, THYROIDAB in the last 72 hours. ?Anemia Panel: ?No results for input(s): VITAMINB12, FOLATE, FERRITIN, TIBC, IRON, RETICCTPCT in the last 72 hours. ?Sepsis Labs: ?No results for input(s): PROCALCITON, LATICACIDVEN in the last 168 hours. ? ?No results found for this or any previous visit (from the past 240 hour(s)).  ? ? ? ? ? ?Radiology Studies: ?CT ABDOMEN PELVIS W CONTRAST ? ?Result Date: 04/09/2022 ?CLINICAL  DATA:  Abdominal pain, acute, nonlocalized. Nausea, vomiting, constipation EXAM: CT ABDOMEN AND PELVIS WITH CONTRAST TECHNIQUE: Multidetector CT imaging of the abdomen and pelvis was performed using the standard protocol following bolus administration of intravenous contrast. RADIATION DOSE REDUCTION: This exam was performed according to the departmental dose-optimization program which includes automated exposure control, adjustment of the mA and/or kV according to patient size and/or use of iterative reconstruction technique. CONTRAST:  171m OMNIPAQUE IOHEXOL 300 MG/ML  SOLN COMPARISON:  04/17/2020 FINDINGS: Lower chest: No acute abnormality. Hepatobiliary: No focal liver abnormality is seen. No gallstones,  gallbladder wall thickening, or biliary dilatation. Pancreas: Unremarkable Spleen: Unremarkable Adrenals/Urinary Tract: The adrenal glands are unremarkable. The kidneys are normal in size and position. Simple cortic

## 2022-04-09 NOTE — Consult Note (Signed)
? ? ? ?Shane Clark ?04/02/1951  ?[id number].   ? ?Requesting MD: Dr. Eric [place]  ?Chief Complaint/Reason for Consult: SBO ? ?HPI: Shane Clark is a 71 y.o. male who presented to the ED on 5/9 for abdominal pain. Patient awoke 5/9 with central and LUQ abdominal pain, bloating, and nausea. Symptoms continually worsened throughout the day. He tried some Gatorade around 3pm that was immediatly followed by emesis. Presented to the ED for evaluation. Noted to be afebrile, without tachycardia, or sbp < 90. WBC 8.1. CT A/P with mid sbo. NGT was attempted by ED RN and unsuccessful. He reports last episode of emesis was on CT scan table last night. Still nauseated this morning and having a few episodes of dry heaves. Pain has improved to a 5/10 in the central and LUQ. Passing flatus. Had a normal bm yesterday morning shortly after onset of symptoms and a very small bm last night. Symptoms feel similar to when he has had sbo's in the past but slightly worse overall.  ? ?Patient has a history prior appendectomy at age 73, Exploratory laparotomy and umbilical hernia repair in 2003 by Dr. Ninfa Linden, and exploratory laparotomy lysis of adhesions on 12/15/2010 by Dr. Donne Hazel. He has hx of prior SBO in 2018, 2019 and 2021 that were tx w/ medical management w/ resolution of symptoms w/ bowel rest w/o NGT. He takes a baby asa daily. No other antiplatelet or any anticoagulation medications. He has been admitted to [facility] and we were asked to see.  ? ?PMHx: HTN, COPD, GERD ? ?ROS: ?Review of Systems  ?Constitutional:  Negative for chills and fever.  ?Respiratory:  Negative for shortness of breath.   ?Cardiovascular:  Negative for chest pain.  ?Gastrointestinal:  Positive for abdominal pain, nausea and vomiting. Negative for constipation and diarrhea.  ? ?Family History  ?Problem Relation Age of Onset  ? Coronary artery disease Father   ? Pancreatic cancer Brother   ? Stroke Brother   ? Heart disease Brother   ? Dementia Mother   ?  Diabetes type II Other   ? Colon cancer Neg Hx   ? Colon polyps Neg Hx   ? Rectal cancer Neg Hx   ? Stomach cancer Neg Hx   ? ? ?Past Medical History:  ?Diagnosis Date  ? Allergy   ? Arthritis   ? Asthma   ? BPH (benign prostatic hyperplasia)   ? Chronic back pain   ? "all over"  ? COPD (chronic obstructive pulmonary disease) ([place])   ? GERD (gastroesophageal reflux disease)   ? Hx of adenomatous colonic polyps 03/22/2016  ? Hypertension   ? Influenza 11/20/2011  ? Sepsis ([place]) 02/05/2016  ? ? ?Past Surgical History:  ?Procedure Laterality Date  ? ABDOMINAL HERNIA REPAIR    ? ANTERIOR CERVICAL DECOMP/DISCECTOMY FUSION    ? C6-8  ? APPENDECTOMY  1960s  ? BACK SURGERY    ? COLONOSCOPY    ? HERNIA REPAIR    ? INSERTION DISTRACTION DEVICE LUMBAR POSTERIOR SPINOUS PROCESS    ? KNEE ARTHROSCOPY Left   ? SHOULDER SURGERY Left 02/11/2017  ? SMALL BOWEL REPAIR    ? ? ?Social History:  reports that he has quit smoking. His smoking use included cigarettes. He has a 2.40 pack-year smoking history. He has quit using smokeless tobacco. He reports that he does not drink alcohol and does not use drugs. ?Quit smoking 40 years ago ?No alcohol use ?No illicit drug use ?Retired ? ?Allergies:  ?Allergies  ?  Allergen Reactions  ? Codeine Shortness Of Breath  ? Percocet [Oxycodone-Acetaminophen] Anaphylaxis  ? Angiotensin Receptor Blockers Other (See Comments)  ?  Dyspnea  ? Crestor [Rosuvastatin]   ?  Other reaction(s): Feet Hurt  ? Lyrica [Pregabalin] Other (See Comments)  ?  Headache  ? Mevacor [Lovastatin]   ?  Joint Pain  ? Pravachol [Pravastatin] Other (See Comments)  ? Talwin [Pentazocine] Nausea Only and Other (See Comments)  ?  Stopped breathing  ? Zestril [Lisinopril] Other (See Comments)  ?  Dyspnea  ? Zetia [Ezetimibe] Itching  ? Advair Diskus [Fluticasone-Salmeterol] Rash  ?  unknown  ? ? ?Medications Prior to Admission  ?Medication Sig Dispense Refill  ? acetaminophen (TYLENOL) 500 MG tablet Take 1,000 mg by mouth every 6 (six)  hours as needed.    ? albuterol (VENTOLIN HFA) 108 (90 Base) MCG/ACT inhaler Inhale 1-2 puffs into the lungs every 4 (four) hours as needed. 8.5 g 3  ? aspirin EC 81 MG tablet Take 81 mg by mouth daily. Swallow whole.    ? azelastine (ASTELIN) 0.1 % nasal spray Place 2 sprays into both nostrils 2 (two) times daily as needed for congestion.    ? budesonide-formoterol (SYMBICORT) 80-4.5 MCG/ACT inhaler Inhale 2 puffs into the lungs 2 (two) times daily. 30.6 g 3  ? loratadine (CLARITIN) 10 MG tablet Take 10 mg by mouth daily as needed for allergies.     ? Multiple Vitamins-Minerals (ZINC PO) Take 1 tablet by mouth daily. CD ZINC    ? nitroGLYCERIN (NITROSTAT) 0.4 MG SL tablet Place 1 tablet (0.4 mg total) under the tongue as needed. 25 tablet 2  ? pantoprazole (PROTONIX) 40 MG tablet Take 1 tablet (40 mg total) by mouth daily for acid reflux 90 tablet 3  ? rosuvastatin (CRESTOR) 5 MG tablet Take 1 tablet (5 mg total) by mouth daily. 90 tablet 2  ? tamsulosin (FLOMAX) 0.4 MG CAPS capsule Take 1 capsule (0.4 mg total) by mouth 30 minutes after the same meal each day for urinary symtomps 90 capsule 3  ? Tamsulosin HCl (FLOMAX) 0.4 MG CAPS Take 0.4 mg by mouth daily.    ? telmisartan (MICARDIS) 20 MG tablet Take 1 tablet (20 mg total) by mouth daily for blood pressure 90 tablet 3  ? metoprolol tartrate (LOPRESSOR) 100 MG tablet Take one tablet by mouth 2 hours before your CT (Patient not taking: Reported on 04/09/2022) 1 tablet 0  ? telmisartan (MICARDIS) 40 MG tablet Take 1 tablet by mouth  once daily for blood pressure (Patient not taking: Reported on 04/09/2022) 90 tablet 3  ? telmisartan (MICARDIS) 40 MG tablet Take 0.5 tablets (20 mg total) by mouth daily. (Patient not taking: Reported on 04/09/2022) 90 tablet 3  ? ? ? ?Physical Exam: ?Blood pressure 128/69, pulse 72, temperature 97.7 ?F (36.5 ?C), temperature source Oral, resp. rate 18, SpO2 97 %. ?General: pleasant, WD/WN white male who is laying in bed in NAD ?HEENT:  head is normocephalic, atraumatic.  Sclera are noninjected.  PERRL.  Ears and nose without any masses or lesions.  Mouth is pink and moist. Dentition fair ?Heart: regular, rate, and rhythm.  Palpable radial pulses bilaterally  ?Lungs: CTAB, no wheezes, rhonchi, or rales noted.  Respiratory effort nonlabored ?Abd: Moderate distension but soft, epigastric and LUQ tenderness without involuntary guarding or rigidity. Hypoactive BS. Small umbilical hernia that is reducible. ?MS: no BUE/BLE edema, calves soft and nontender ?Skin: warm and dry with no masses, lesions, or rashes ?  Psych: A&Ox4 with an appropriate affect ?Neuro: cranial nerves grossly intact, normal speech, thought process intact, moves all extremities, gait not assessed ? ?Results for orders placed or performed during the hospital encounter of 04/08/22 (from the past 48 hour(s))  ?Lipase, blood     Status: None  ? Collection Time: 04/08/22  8:21 PM  ?Result Value Ref Range  ? Lipase 26 11 - 51 U/L  ?  Comment: Performed at [facility], [place] [address] [place], [place] [address]  ?Comprehensive metabolic panel     Status: Abnormal  ? Collection Time: 04/08/22  8:21 PM  ?Result Value Ref Range  ? Sodium 135 135 - 145 mmol/L  ? Potassium 4.4 3.5 - 5.1 mmol/L  ? Chloride 104 98 - 111 mmol/L  ? CO2 23 22 - 32 mmol/L  ? Glucose, Bld 112 (H) 70 - 99 mg/dL  ?  Comment: Glucose reference range applies only to samples taken after fasting for at least 8 hours.  ? BUN 18 8 - 23 mg/dL  ? Creatinine, Ser 1.21 0.61 - 1.24 mg/dL  ? Calcium 8.9 8.9 - 10.3 mg/dL  ? Total Protein 7.5 6.5 - 8.1 g/dL  ? Albumin 4.1 3.5 - 5.0 g/dL  ? AST 35 15 - 41 U/L  ? ALT 29 0 - 44 U/L  ? Alkaline Phosphatase 67 38 - 126 U/L  ? Total Bilirubin 1.6 (H) 0.3 - 1.2 mg/dL  ? GFR, Estimated >60 >60 mL/min  ?  Comment: (NOTE) ?Calculated using the CKD-EPI Creatinine Equation (2021) ?  ? Anion gap 8 5 - 15  ?  Comment: Performed at [facility], [place] [address], [place], [place] [address]   ?CBC     Status: Abnormal  ? Collection Time: 04/08/22  8:21 PM  ?Result Value Ref Range  ? WBC 10.7 (H) 4.0 - 10.5 K/uL  ? RBC 4.60 4.22 - 5.81 MIL/uL  ? Hemoglobin 14.8 13.0 - 17.0 g/dL  ? HCT 44.0 3

## 2022-04-09 NOTE — Assessment & Plan Note (Signed)
?#)   Hyperlipidemia: Documented history of such, on rosuvastatin as outpatient. ? ?Plan: In setting of current n.p.o. status, will hold home rosuvastatin for now. ? ? ?

## 2022-04-10 ENCOUNTER — Inpatient Hospital Stay (HOSPITAL_COMMUNITY): Payer: Medicare Other

## 2022-04-10 LAB — COMPREHENSIVE METABOLIC PANEL
ALT: 22 U/L (ref 0–44)
AST: 23 U/L (ref 15–41)
Albumin: 3.2 g/dL — ABNORMAL LOW (ref 3.5–5.0)
Alkaline Phosphatase: 51 U/L (ref 38–126)
Anion gap: 4 — ABNORMAL LOW (ref 5–15)
BUN: 21 mg/dL (ref 8–23)
CO2: 26 mmol/L (ref 22–32)
Calcium: 8.1 mg/dL — ABNORMAL LOW (ref 8.9–10.3)
Chloride: 108 mmol/L (ref 98–111)
Creatinine, Ser: 1.2 mg/dL (ref 0.61–1.24)
GFR, Estimated: >60 mL/min (ref >60)
Glucose, Bld: 89 mg/dL (ref 70–99)
Potassium: 4.6 mmol/L (ref 3.5–5.1)
Sodium: 138 mmol/L (ref 135–145)
Total Bilirubin: 0.9 mg/dL (ref 0.3–1.2)
Total Protein: 6.2 g/dL — ABNORMAL LOW (ref 6.5–8.1)

## 2022-04-10 LAB — MAGNESIUM: Magnesium: 1.9 mg/dL (ref 1.7–2.4)

## 2022-04-10 MED ORDER — TAMSULOSIN HCL 0.4 MG PO CAPS
0.4000 mg | ORAL_CAPSULE | Freq: Every day | ORAL | Status: DC
  Administered 2022-04-10 – 2022-04-11 (×2): 0.4 mg via ORAL
  Filled 2022-04-10 (×2): qty 1

## 2022-04-10 NOTE — Plan of Care (Signed)
?  Problem: Clinical Measurements: Goal: Diagnostic test results will improve Outcome: Progressing   Problem: Nutrition: Goal: Adequate nutrition will be maintained Outcome: Progressing   Problem: Coping: Goal: Level of anxiety will decrease Outcome: Progressing   

## 2022-04-10 NOTE — Progress Notes (Signed)
Mobility Specialist Progress Note: ? ? 04/10/22 0942  ?Mobility  ?Activity Ambulated with assistance in hallway  ?Level of Assistance Modified independent, requires aide device or extra time  ?Assistive Device None  ?Distance Ambulated (ft) 1120 ft  ?Activity Response Tolerated well  ?$Mobility charge 1 Mobility  ? ?Pt received in bed willing to participate in mobility. No complaints of pain. Left in chair with call bell in reach and all needs met.  ? ?Shane Clark ?Mobility Specialist ?Primary Phone [phone] ? ?

## 2022-04-10 NOTE — Progress Notes (Signed)
? ? ?   ?Subjective: ?Feels great today.  Passing flatus.  No BM,  pain completely resolved. ? ?ROS: See above, otherwise other systems negative ? ?Objective: ?Vital signs in last 24 hours: ?Temp:  [97.9 ?F (36.6 ?C)-98.7 ?F (37.1 ?C)] 98.7 ?F (37.1 ?C) (05/11 0848) ?Pulse Rate:  [62-77] 71 (05/11 0848) ?Resp:  [15-18] 18 (05/11 0848) ?BP: (116-142)/(65-73) 120/73 (05/11 0848) ?SpO2:  [91 %-96 %] 92 % (05/11 0848) ?Last BM Date : 04/08/22 ? ?Intake/Output from previous day: ?05/10 0701 - 05/11 0700 ?In: 2070.8 [I.V.:2070.8] ?Out: 300 [Emesis/NG output:300] ?Intake/Output this shift: ?No intake/output data recorded. ? ?PE: ?Abd: soft, NT, ND, +BS, NGT with 50-100cc of output currently, watered down ? ?Lab Results:  ?Recent Labs  ?  04/08/22 ?2021 04/09/22 ?0259  ?WBC 10.7* 8.1  ?HGB 14.8 14.4  ?HCT 44.0 42.2  ?PLT 216 209  ? ?BMET ?Recent Labs  ?  04/09/22 ?0259 04/10/22 ?5956  ?NA 135 138  ?K 4.4 4.6  ?CL 105 108  ?CO2 23 26  ?GLUCOSE 124* 89  ?BUN 17 21  ?CREATININE 1.02 1.20  ?CALCIUM 8.8* 8.1*  ? ?PT/INR ?No results for input(s): LABPROT, INR in the last 72 hours. ?CMP  ?   ?Component Value Date/Time  ? NA 138 04/10/2022 0346  ? NA 137 08/06/2021 1114  ? K 4.6 04/10/2022 0346  ? CL 108 04/10/2022 0346  ? CO2 26 04/10/2022 0346  ? GLUCOSE 89 04/10/2022 0346  ? BUN 21 04/10/2022 0346  ? BUN 20 08/06/2021 1114  ? CREATININE 1.20 04/10/2022 0346  ? CALCIUM 8.1 (L) 04/10/2022 0346  ? PROT 6.2 (L) 04/10/2022 0346  ? ALBUMIN 3.2 (L) 04/10/2022 0346  ? AST 23 04/10/2022 0346  ? ALT 22 04/10/2022 0346  ? ALKPHOS 51 04/10/2022 0346  ? BILITOT 0.9 04/10/2022 0346  ? GFRNONAA >60 04/10/2022 0346  ? GFRAA >60 04/19/2020 0347  ? ?Lipase  ?   ?Component Value Date/Time  ? LIPASE 26 04/08/2022 2021  ? ? ? ? ? ?Studies/Results: ?CT ABDOMEN PELVIS W CONTRAST ? ?Result Date: 04/09/2022 ?CLINICAL DATA:  Abdominal pain, acute, nonlocalized. Nausea, vomiting, constipation EXAM: CT ABDOMEN AND PELVIS WITH CONTRAST TECHNIQUE:  Multidetector CT imaging of the abdomen and pelvis was performed using the standard protocol following bolus administration of intravenous contrast. RADIATION DOSE REDUCTION: This exam was performed according to the departmental dose-optimization program which includes automated exposure control, adjustment of the mA and/or kV according to patient size and/or use of iterative reconstruction technique. CONTRAST:  154m OMNIPAQUE IOHEXOL 300 MG/ML  SOLN COMPARISON:  04/17/2020 FINDINGS: Lower chest: No acute abnormality. Hepatobiliary: No focal liver abnormality is seen. No gallstones, gallbladder wall thickening, or biliary dilatation. Pancreas: Unremarkable Spleen: Unremarkable Adrenals/Urinary Tract: The adrenal glands are unremarkable. The kidneys are normal in size and position. Simple cortical cyst seen within the upper pole the right kidney. No follow-up imaging is recommended for this lesion. No enhancing intrarenal masses. No hydronephrosis. No intrarenal or ureteral calculi. The bladder is unremarkable. Stomach/Bowel: A mid small bowel obstruction is present with a point of transition seen within the right mid abdomen at axial image # 74/3 and coronal image # 33/6. The proximal small bowel loops appear fluid-filled and dilated distally, the small bowel appears decompressed. Mild gaseous distension of the stomach. Mild scattered diverticulosis is noted of the colon. No superimposed acute inflammatory change. The stomach, small bowel, and large bowel are otherwise unremarkable. The appendix is absent. No free intraperitoneal gas or  fluid. Vascular/Lymphatic: Fusiform infrarenal abdominal aortic aneurysm is present with maximal transaxial dimension of 3.0 cm. Superimposed moderate aortoiliac atherosclerotic calcification. No pathologic adenopathy within the abdomen and pelvis. Reproductive: Moderate prostatic enlargement. Seminal vesicles are unremarkable. Other: No abdominal wall hernia.  Rectum unremarkable.  Musculoskeletal: Degenerative changes seen within the lumbar spine. No acute bone abnormality. No lytic or blastic bone lesion. IMPRESSION: Mid small bowel obstruction. An obstructing lesion is not clearly identified and an underlying adhesion is suspected. No free intraperitoneal gas or fluid. 3 cm infrarenal abdominal aortic aneurysm. Recommend follow-up every 3 years. Reference: J Am Coll Radiol 4401;02:[id number]. Moderate prostatic enlargement Aortic aneurysm NOS (ICD10-I71.9). Electronically Signed   By: Fidela Salisbury M.D.   On: 04/09/2022 00:01  ? ?DG Abd Portable 1V ? ?Result Date: 04/09/2022 ?CLINICAL DATA:  NG tube placement EXAM: PORTABLE ABDOMEN - 1 VIEW COMPARISON:  CT done on 04/08/2022 FINDINGS: Tip of enteric tube is seen within the fundus of the uterus. There is moderate gaseous distention of stomach. Lower abdomen and pelvis are not included in the image limiting evaluation of small-bowel loops. IMPRESSION: Tip of enteric tube is seen within fundus of the stomach. Electronically Signed   By: Elmer Picker M.D.   On: 04/09/2022 12:29  ? ?DG Abd Portable 1V-Small Bowel Obstruction Protocol-initial, 8 hr delay ? ?Result Date: 04/09/2022 ?CLINICAL DATA:  Bowel obstruction 8 hour delay EXAM: PORTABLE ABDOMEN - 1 VIEW COMPARISON:  04/09/2022, CT 04/08/2022 FINDINGS: Esophageal tube tip overlies the stomach. Persistent distension of central small bowel up to 4.7 cm. Small amount of dilute contrast present within the splenic flexure and left colon. IMPRESSION: 1. Small amount of dilute contrast present within the splenic flexure and left colon. 2. Persistent central small bowel distension suggestive of at least partial obstruction Electronically Signed   By: Donavan Foil M.D.   On: 04/09/2022 22:28  ? ?DG Abd Portable 1V-Small Bowel Obstruction Protocol-24 hr delay ? ?Result Date: 04/10/2022 ?CLINICAL DATA:  Small-bowel obstruction, 24 hour delayed images EXAM: PORTABLE ABDOMEN - 1 VIEW COMPARISON:   Previous studies including the examination of 04/09/2022 FINDINGS: There is interval decrease in degree of small bowel dilation. Gas and stool are present in colon. There is contrast in the lumen of colon. There is no evidence of contrast in the small bowel loops. Tip of enteric tube is seen in the fundus of the stomach. IMPRESSION: There is interval decrease in degree of small bowel dilation. Electronically Signed   By: Elmer Picker M.D.   On: 04/10/2022 08:32   ? ?Anti-infectives: ?Anti-infectives (From admission, onward)  ? ? None  ? ?  ? ? ? ?Assessment/Plan ?SBO ?- follow up film with contrast in the colon.  Some dilated small bowel still present, but likely lagging as clinically patient is significantly improved ?-DC NGT/CLD ?-discussed with primary service ?  ?FEN - CLD ?VTE - SCDs, okay for chemical prophylaxis from a general surgery standpoint ?ID - None  ?Foley - None ?  ?HTN ?COPD ?GERD ? ?I reviewed hospitalist notes, last 24 h vitals and pain scores, last 48 h intake and output, last 24 h labs and trends, and last 24 h imaging results. ? ? LOS: 1 day  ? ? ?Henreitta Cea , PA-C ?[facility] ?04/10/2022, 9:39 AM ?Please see Amion for pager number during day hours 7:00am-4:30pm or 7:00am -11:30am on weekends ? ?

## 2022-04-10 NOTE — Progress Notes (Signed)
?PROGRESS NOTE ? ? ? ?SAADIQ POCHE  HUD:[id number] DOB: Jun 22, 1951 DOA: 04/08/2022 ?PCP: Gaynelle Arabian, MD  ? ? ?Brief Narrative:  ? ?Shane Clark is a 71 y.o. male with past medical history significant for BPH, COPD, GERD, HTN, HLD, history of previous small bowel stricture with multiple hernia repairs and recurrent small bowel obstructions who presents to [facility] ED on 5/9 with progressive generalized abdominal discomfort associated with nausea and vomiting.  Onset 1 day prior.  Reports history of multiple previous small bowel obstructions in the context of multiple hernia repairs in the past.  He reports that his bowel obstructions have previously resolved spontaneously although he typically has difficulty with NG tube placement.  Denies fever/chills, no chest pain, no palpitations, no shortness of breath. ? ?In the ED, temperature 98.0 ?F, HR 91, RR 16, BP 1 02/02/1971, SPO2 94% on room air.  WBC 10.7, hemoglobin 14.,  Platelet 209.  Sodium 135, potassium 4.4, chloride 104, CO2 23, glucose 112, BUN 18, creatinine was 1.21.  AST 35, ALT 29, total bilirubin 1.6.  Lipase 26.  Urinalysis with moderate leukocytes, negative nitrite, rare bacteria, 6-10 WBCs.  CT abdomen/pelvis with mid small bowel obstruction with no obstructing lesion clearly identified but likely underlying adhesions suspected, no free intraperitoneal gas or fluid.  Hospital service consulted for further evaluation management of small bowel obstruction ? ? ? ?Assessment & Plan: ?  ? ?Small bowel obstruction ?Patient presenting with acute onset generalized abdominal discomfort associate with nausea/vomiting.  CT abdomen/pelvis with mid small bowel obstruction with no obstructing lesion clearly identified but likely underlying adhesions suspected, no free intraperitoneal gas or fluid. History of previous small bowel obstructions related to multiple hernia repairs in the past that have responded to conservative measures.   ?--General surgery following,  appreciate assistance ?--Plan to discontinue NG tube today and start clear liquid diet ?--Dilaudid 0.5 mg IV Q2h PRN severe pain ?--Zofran/Compazine as needed nausea/vomiting ? ?COPD ?Stable, on room air.  On Symbicort 2 puffs twice daily outpatient. ?--Continue Dulera 2 puffs twice daily as hospital substitution ? ?HTN ?On telmisartan 20 mg p.o. daily at home. ?--Holding currently due given well-controlled blood pressures currently ?--Hydralazine '10mg'$  IV q6h PRN SBP >165 or DBP >110 ? ?HLD ?On Crestor 5 mg p.o. daily outpatient. ?--continue to hold until tolerates further advance diet ? ?GERD ?On Protonix 40 mg p.o. daily. ?--Protonix 40 mg IV daily while n.p.o. ? ?BPH ?--tamsulosin 0.4 mg daily outpatient. ?--Bladder scan as needed ? ?Abdominal aortic aneurysm ?CT abdomen/pelvis with 3 cm infrarenal abdominal aortic aneurysm.  Will need follow-up every 3 years outpatient. ? ?Morbid obesity ?Body mass index is 40.02 kg/m?Marland Kitchen  Discussed with patient needs for aggressive lifestyle changes/weight loss as this complicates all facets of care.  Outpatient follow-up with PCP.   ? ? ? ?DVT prophylaxis: SCDs Start: 04/09/22 0225 ? ?  Code Status: Full Code ?Family Communication: No family present at bedside this morning ? ?Disposition Plan:  ?Level of care: Telemetry Medical ?Status is: Inpatient ?Remains inpatient appropriate because: Continues with small bowel obstruction, inserting NG tube today, will need to await return of bowel function and advancement of diet before stable for discharge home, anticipate discharge in 3-4 days unless may require surgical intervention ?  ? ?Consultants:  ?General surgery ? ?Procedures:  ?NG tube placement 5/10 ? ?Antimicrobials:  ?None ? ? ?Subjective: ?Patient seen examined bedside, resting comfortably.  Respiratory therapy present.  Patient reports abdominal distention much improved, continues to pass flatus.  No bowel movement last 24 hours.  Seen and discussed with general surgery  this morning, plan to discontinue NG tube and start clear liquid diet.  Patient with no other complaints or concerns at this time.  Denies headache, no dizziness, no chest pain, no shortness of breath, no current vomiting, no diarrhea, no palpitations, no weakness, no fever/chills/night sweats, no fatigue, no paresthesias.  No acute events overnight per nursing staff. ? ?Objective: ?Vitals:  ? 04/09/22 1900 04/10/22 0440 04/10/22 0838 04/10/22 0848  ?BP: 120/65 116/65  120/73  ?Pulse: 62 72 77 71  ?Resp: '17 16 18 18  '$ ?Temp: 98.6 ?F (37 ?C) 98.5 ?F (36.9 ?C)  98.7 ?F (37.1 ?C)  ?TempSrc: Oral Oral  Oral  ?SpO2: 93% 91% 93% 92%  ?Weight:      ?Height:      ? ? ?Intake/Output Summary (Last 24 hours) at 04/10/2022 1143 ?Last data filed at 04/10/2022 0044 ?Gross per 24 hour  ?Intake 2070.8 ml  ?Output 300 ml  ?Net 1770.8 ml  ? ?Filed Weights  ? 04/09/22 0844  ?Weight: 119.4 kg  ? ? ?Examination: ? ?Physical Exam: ?GEN: NAD, alert and oriented x 3, obese ?HEENT: NCAT, PERRL, EOMI, sclera clear, MMM ?PULM: CTAB w/o wheezes/crackles, normal respiratory effort, on room air ?CV: RRR w/o M/G/R ?GI: abd soft, nondistended, nontender to palpation,+ BS, no R/G/M, NG tube to suction noted ?MSK: no peripheral edema, muscle strength globally intact 5/5 bilateral upper/lower extremities ?NEURO: CN II-XII intact, no focal deficits, sensation to light touch intact ?PSYCH: normal mood/affect ?Integumentary: dry/intact, no rashes or wounds ? ? ? ?Data Reviewed: I have personally reviewed following labs and imaging studies ? ?CBC: ?Recent Labs  ?Lab 04/08/22 ?2021 04/09/22 ?0259  ?WBC 10.7* 8.1  ?NEUTROABS  --  6.9  ?HGB 14.8 14.4  ?HCT 44.0 42.2  ?MCV 95.7 95.5  ?PLT 216 209  ? ?Basic Metabolic Panel: ?Recent Labs  ?Lab 04/08/22 ?2021 04/09/22 ?0259 04/10/22 ?3875  ?NA 135 135 138  ?K 4.4 4.4 4.6  ?CL 104 105 108  ?CO2 '23 23 26  '$ ?GLUCOSE 112* 124* 89  ?BUN '18 17 21  '$ ?CREATININE 1.21 1.02 1.20  ?CALCIUM 8.9 8.8* 8.1*  ?MG  --  1.8 1.9   ? ?GFR: ?Estimated Creatinine Clearance: 71.9 mL/min (by C-G formula based on SCr of 1.2 mg/dL). ?Liver Function Tests: ?Recent Labs  ?Lab 04/08/22 ?2021 04/09/22 ?0259 04/10/22 ?6433  ?AST 35 29 23  ?ALT '29 27 22  '$ ?ALKPHOS 67 59 51  ?BILITOT 1.6* 1.1 0.9  ?PROT 7.5 7.4 6.2*  ?ALBUMIN 4.1 3.8 3.2*  ? ?Recent Labs  ?Lab 04/08/22 ?2021  ?LIPASE 26  ? ?No results for input(s): AMMONIA in the last 168 hours. ?Coagulation Profile: ?No results for input(s): INR, PROTIME in the last 168 hours. ?Cardiac Enzymes: ?No results for input(s): CKTOTAL, CKMB, CKMBINDEX, TROPONINI in the last 168 hours. ?BNP (last 3 results) ?No results for input(s): PROBNP in the last 8760 hours. ?HbA1C: ?No results for input(s): HGBA1C in the last 72 hours. ?CBG: ?No results for input(s): GLUCAP in the last 168 hours. ?Lipid Profile: ?No results for input(s): CHOL, HDL, LDLCALC, TRIG, CHOLHDL, LDLDIRECT in the last 72 hours. ?Thyroid Function Tests: ?No results for input(s): TSH, T4TOTAL, FREET4, T3FREE, THYROIDAB in the last 72 hours. ?Anemia Panel: ?No results for input(s): VITAMINB12, FOLATE, FERRITIN, TIBC, IRON, RETICCTPCT in the last 72 hours. ?Sepsis Labs: ?No results for input(s): PROCALCITON, LATICACIDVEN in the last 168 hours. ? ?No results found  for this or any previous visit (from the past 240 hour(s)).  ? ? ? ? ? ?Radiology Studies: ?CT ABDOMEN PELVIS W CONTRAST ? ?Result Date: 04/09/2022 ?CLINICAL DATA:  Abdominal pain, acute, nonlocalized. Nausea, vomiting, constipation EXAM: CT ABDOMEN AND PELVIS WITH CONTRAST TECHNIQUE: Multidetector CT imaging of the abdomen and pelvis was performed using the standard protocol following bolus administration of intravenous contrast. RADIATION DOSE REDUCTION: This exam was performed according to the departmental dose-optimization program which includes automated exposure control, adjustment of the mA and/or kV according to patient size and/or use of iterative reconstruction technique. CONTRAST:   145m OMNIPAQUE IOHEXOL 300 MG/ML  SOLN COMPARISON:  04/17/2020 FINDINGS: Lower chest: No acute abnormality. Hepatobiliary: No focal liver abnormality is seen. No gallstones, gallbladder wall thickening, or bili

## 2022-04-11 LAB — BASIC METABOLIC PANEL
Anion gap: 7 (ref 5–15)
BUN: 16 mg/dL (ref 8–23)
CO2: 25 mmol/L (ref 22–32)
Calcium: 8.1 mg/dL — ABNORMAL LOW (ref 8.9–10.3)
Chloride: 104 mmol/L (ref 98–111)
Creatinine, Ser: 1.03 mg/dL (ref 0.61–1.24)
GFR, Estimated: >60 mL/min (ref >60)
Glucose, Bld: 77 mg/dL (ref 70–99)
Potassium: 3.8 mmol/L (ref 3.5–5.1)
Sodium: 136 mmol/L (ref 135–145)

## 2022-04-11 NOTE — Progress Notes (Signed)
Patient discharged to home with instructions. 

## 2022-04-11 NOTE — Care Management Important Message (Signed)
Important Message ? ?Patient Details  ?Name: Shane Clark ?MRN: [id number] ?Date of Birth: [birth year] ? ? ?Medicare Important Message Given:  Yes ? ? ? ? ?Jamas Jaquay ?04/11/2022, 3:06 PM ?

## 2022-04-11 NOTE — Discharge Summary (Signed)
?Physician Discharge Summary  ?[name] GXQ:[id number] DOB: 24-Jul-1951 DOA: 04/08/2022 ? ?PCP: Gaynelle Arabian, MD ? ?Admit date: 04/08/2022 ?Discharge date: 04/11/2022 ? ?Admitted From: Home ?Disposition: Home ? ?Recommendations for Outpatient Follow-up:  ?Follow up with PCP in 1-2 weeks ?Needs continued surveillance outpatient of abdominal aortic aneurysm ? ? ?Home Health: No ?Equipment/Devices: None ? ?Discharge Condition: Stable ?CODE STATUS: Full code ?Diet recommendation:  ? ?History of present illness: ? ?Shane Clark is a 71 y.o. male with past medical history significant for BPH, COPD, GERD, HTN, HLD, history of previous small bowel stricture with multiple hernia repairs and recurrent small bowel obstructions who presents to [facility] ED on 5/9 with progressive generalized abdominal discomfort associated with nausea and vomiting.  Onset 1 day prior.  Reports history of multiple previous small bowel obstructions in the context of multiple hernia repairs in the past.  He reports that his bowel obstructions have previously resolved spontaneously although he typically has difficulty with NG tube placement.  Denies fever/chills, no chest pain, no palpitations, no shortness of breath. ?  ?In the ED, temperature 98.0 ?F, HR 91, RR 16, BP 1 02/02/1971, SPO2 94% on room air.  WBC 10.7, hemoglobin 14.,  Platelet 209.  Sodium 135, potassium 4.4, chloride 104, CO2 23, glucose 112, BUN 18, creatinine was 1.21.  AST 35, ALT 29, total bilirubin 1.6.  Lipase 26.  Urinalysis with moderate leukocytes, negative nitrite, rare bacteria, 6-10 WBCs.  CT abdomen/pelvis with mid small bowel obstruction with no obstructing lesion clearly identified but likely underlying adhesions suspected, no free intraperitoneal gas or fluid.  Hospital service consulted for further evaluation management of small bowel obstruction ? ?Hospital course: ? ?Small bowel obstruction ?Patient presenting with acute onset generalized abdominal discomfort  associate with nausea/vomiting.  CT abdomen/pelvis with mid small bowel obstruction with no obstructing lesion clearly identified but likely underlying adhesions suspected, no free intraperitoneal gas or fluid. History of previous small bowel obstructions related to multiple hernia repairs in the past that have responded to conservative measures.  General surgery was consulted and followed during hospital course.  NG tube was placed to low wall intermittent suction with improvement of symptoms.  Patient was started on a clear liquid diet and slowly advanced with good toleration.  Patient had a bowel movement prior to discharge.  Outpatient follow-up with PCP. ?  ?COPD ?Stable, on room air.  On Symbicort 2 puffs twice daily outpatient. ?  ?HTN ?On telmisartan 20 mg p.o. daily at home. ?  ?HLD ?On Crestor 5 mg p.o. daily outpatient. ?  ?GERD ?On Protonix 40 mg p.o. daily. ?  ?BPH ?--tamsulosin 0.4 mg daily outpatient. ?  ?Abdominal aortic aneurysm ?CT abdomen/pelvis with 3 cm infrarenal abdominal aortic aneurysm.  Will need follow-up every 3 years outpatient. ?  ?Morbid obesity ?Body mass index is 40.02 kg/m?Marland Kitchen  Discussed with patient needs for aggressive lifestyle changes/weight loss as this complicates all facets of care.  Outpatient follow-up with PCP. ? ? ?Discharge Diagnoses:  ?Principal Problem: ?  SBO (small bowel obstruction) ([place]) ?Active Problems: ?  COPD (chronic obstructive pulmonary disease) ([place]) ?  BPH (benign prostatic hyperplasia) ?  GERD (gastroesophageal reflux disease) ?  HLD (hyperlipidemia) ? ? ? ?Discharge Instructions ? ?Discharge Instructions   ? ? Call MD for:  difficulty breathing, headache or visual disturbances   Complete by: As directed ?  ? Call MD for:  extreme fatigue   Complete by: As directed ?  ? Call MD for:  persistant  dizziness or light-headedness   Complete by: As directed ?  ? Call MD for:  persistant nausea and vomiting   Complete by: As directed ?  ? Call MD for:  severe  uncontrolled pain   Complete by: As directed ?  ? Call MD for:  temperature >100.4   Complete by: As directed ?  ? Diet - low sodium heart healthy   Complete by: As directed ?  ? Increase activity slowly   Complete by: As directed ?  ? ?  ? ?Allergies as of 04/11/2022   ? ?   Reactions  ? Codeine Shortness Of Breath  ? Percocet [oxycodone-acetaminophen] Anaphylaxis  ? Angiotensin Receptor Blockers Other (See Comments)  ? Dyspnea  ? Crestor [rosuvastatin]   ? Other reaction(s): Feet Hurt  ? Lyrica [pregabalin] Other (See Comments)  ? Headache  ? Mevacor [lovastatin]   ? Joint Pain  ? Pravachol [pravastatin] Other (See Comments)  ? Talwin [pentazocine] Nausea Only, Other (See Comments)  ? Stopped breathing  ? Zestril [lisinopril] Other (See Comments)  ? Dyspnea  ? Zetia [ezetimibe] Itching  ? Advair Diskus [fluticasone-salmeterol] Rash  ? unknown  ? ?  ? ?  ?Medication List  ?  ? ?STOP taking these medications   ? ?metoprolol tartrate 100 MG tablet ?Commonly known as: LOPRESSOR ?  ? ?  ? ?TAKE these medications   ? ?acetaminophen 500 MG tablet ?Commonly known as: TYLENOL ?Take 1,000 mg by mouth every 6 (six) hours as needed. ?  ?albuterol 108 (90 Base) MCG/ACT inhaler ?Commonly known as: VENTOLIN HFA ?Inhale 1-2 puffs into the lungs every 4 (four) hours as needed. ?  ?aspirin EC 81 MG tablet ?Take 81 mg by mouth daily. Swallow whole. ?  ?azelastine 0.1 % nasal spray ?Commonly known as: ASTELIN ?Place 2 sprays into both nostrils 2 (two) times daily as needed for congestion. ?  ?loratadine 10 MG tablet ?Commonly known as: CLARITIN ?Take 10 mg by mouth daily as needed for allergies. ?  ?nitroGLYCERIN 0.4 MG SL tablet ?Commonly known as: NITROSTAT ?Place 1 tablet (0.4 mg total) under the tongue as needed. ?  ?pantoprazole 40 MG tablet ?Commonly known as: Protonix ?Take 1 tablet (40 mg total) by mouth daily for acid reflux ?  ?rosuvastatin 5 MG tablet ?Commonly known as: CRESTOR ?Take 1 tablet (5 mg total) by mouth daily. ?   ?Symbicort 80-4.5 MCG/ACT inhaler ?Generic drug: budesonide-formoterol ?Inhale 2 puffs into the lungs 2 (two) times daily. ?  ?tamsulosin 0.4 MG Caps capsule ?Commonly known as: FLOMAX ?Take 1 capsule (0.4 mg total) by mouth 30 minutes after the same meal each day for urinary symtomps ?What changed: Another medication with the same name was removed. Continue taking this medication, and follow the directions you see here. ?  ?telmisartan 20 MG tablet ?Commonly known as: MICARDIS ?Take 1 tablet (20 mg total) by mouth daily for blood pressure ?What changed: Another medication with the same name was removed. Continue taking this medication, and follow the directions you see here. ?  ?ZINC PO ?Take 1 tablet by mouth daily. CD ZINC ?  ? ?  ? ? Follow-up Information   ? ? Gaynelle Arabian, MD. Schedule an appointment as soon as possible for a visit in 1 week(s).   ?Specialty: Family Medicine ?Contact information: ?[address] ?[address] ?[place] [address] ?[phone] ? ? ?  ?  ? ?  ?  ? ?  ? ?Allergies  ?Allergen Reactions  ? Codeine  Shortness Of Breath  ? Percocet [Oxycodone-Acetaminophen] Anaphylaxis  ? Angiotensin Receptor Blockers Other (See Comments)  ?  Dyspnea  ? Crestor [Rosuvastatin]   ?  Other reaction(s): Feet Hurt  ? Lyrica [Pregabalin] Other (See Comments)  ?  Headache  ? Mevacor [Lovastatin]   ?  Joint Pain  ? Pravachol [Pravastatin] Other (See Comments)  ? Talwin [Pentazocine] Nausea Only and Other (See Comments)  ?  Stopped breathing  ? Zestril [Lisinopril] Other (See Comments)  ?  Dyspnea  ? Zetia [Ezetimibe] Itching  ? Advair Diskus [Fluticasone-Salmeterol] Rash  ?  unknown  ? ? ?Consultations: ?General surgery ? ? ?Procedures/Studies: ?CT ABDOMEN PELVIS W CONTRAST ? ?Result Date: 04/09/2022 ?CLINICAL DATA:  Abdominal pain, acute, nonlocalized. Nausea, vomiting, constipation EXAM: CT ABDOMEN AND PELVIS WITH CONTRAST TECHNIQUE: Multidetector CT imaging of the abdomen and pelvis was performed  using the standard protocol following bolus administration of intravenous contrast. RADIATION DOSE REDUCTION: This exam was performed according to the departmental dose-optimization program which includes automated

## 2022-04-11 NOTE — Progress Notes (Signed)
? ? ?   ?Subjective: ?CC: ?Doing well. Tolerating liquids without abdominal pain, n/v. Passing flatus. No BM. No prn pain or antinausea meds yesterday or today.  ? ?Objective: ?Vital signs in last 24 hours: ?Temp:  [97.8 ?F (36.6 ?C)-98.5 ?F (36.9 ?C)] 97.8 ?F (36.6 ?C) (05/12 1002) ?Pulse Rate:  [65-76] 65 (05/12 1002) ?Resp:  [18] 18 (05/12 1002) ?BP: (109-124)/(64-73) 118/73 (05/12 1002) ?SpO2:  [92 %-96 %] 96 % (05/12 1002) ?Last BM Date : 04/08/22 ? ?Intake/Output from previous day: ?05/11 0701 - 05/12 0700 ?In: 240 [P.O.:240] ?Out: -  ?Intake/Output this shift: ?Total I/O ?In: 960 [P.O.:960] ?Out: -  ? ?PE: ?Gen:  Alert, NAD, pleasant ?Pulm: rate and effort normal ?Abd: Soft, ND, NT, +BS, ?Psych: A&Ox3  ? ?Lab Results:  ?Recent Labs  ?  04/08/22 ?2021 04/09/22 ?0259  ?WBC 10.7* 8.1  ?HGB 14.8 14.4  ?HCT 44.0 42.2  ?PLT 216 209  ? ?BMET ?Recent Labs  ?  04/10/22 ?0109 04/11/22 ?0211  ?NA 138 136  ?K 4.6 3.8  ?CL 108 104  ?CO2 26 25  ?GLUCOSE 89 77  ?BUN 21 16  ?CREATININE 1.20 1.03  ?CALCIUM 8.1* 8.1*  ? ?PT/INR ?No results for input(s): LABPROT, INR in the last 72 hours. ?CMP  ?   ?Component Value Date/Time  ? NA 136 04/11/2022 0211  ? NA 137 08/06/2021 1114  ? K 3.8 04/11/2022 0211  ? CL 104 04/11/2022 0211  ? CO2 25 04/11/2022 0211  ? GLUCOSE 77 04/11/2022 0211  ? BUN 16 04/11/2022 0211  ? BUN 20 08/06/2021 1114  ? CREATININE 1.03 04/11/2022 0211  ? CALCIUM 8.1 (L) 04/11/2022 0211  ? PROT 6.2 (L) 04/10/2022 0346  ? ALBUMIN 3.2 (L) 04/10/2022 0346  ? AST 23 04/10/2022 0346  ? ALT 22 04/10/2022 0346  ? ALKPHOS 51 04/10/2022 0346  ? BILITOT 0.9 04/10/2022 0346  ? GFRNONAA >60 04/11/2022 0211  ? GFRAA >60 04/19/2020 0347  ? ?Lipase  ?   ?Component Value Date/Time  ? LIPASE 26 04/08/2022 2021  ? ? ?Studies/Results: ?DG Abd Portable 1V ? ?Result Date: 04/09/2022 ?CLINICAL DATA:  NG tube placement EXAM: PORTABLE ABDOMEN - 1 VIEW COMPARISON:  CT done on 04/08/2022 FINDINGS: Tip of enteric tube is seen within the  fundus of the uterus. There is moderate gaseous distention of stomach. Lower abdomen and pelvis are not included in the image limiting evaluation of small-bowel loops. IMPRESSION: Tip of enteric tube is seen within fundus of the stomach. Electronically Signed   By: Elmer Picker M.D.   On: 04/09/2022 12:29  ? ?DG Abd Portable 1V-Small Bowel Obstruction Protocol-initial, 8 hr delay ? ?Result Date: 04/09/2022 ?CLINICAL DATA:  Bowel obstruction 8 hour delay EXAM: PORTABLE ABDOMEN - 1 VIEW COMPARISON:  04/09/2022, CT 04/08/2022 FINDINGS: Esophageal tube tip overlies the stomach. Persistent distension of central small bowel up to 4.7 cm. Small amount of dilute contrast present within the splenic flexure and left colon. IMPRESSION: 1. Small amount of dilute contrast present within the splenic flexure and left colon. 2. Persistent central small bowel distension suggestive of at least partial obstruction Electronically Signed   By: Donavan Foil M.D.   On: 04/09/2022 22:28  ? ?DG Abd Portable 1V-Small Bowel Obstruction Protocol-24 hr delay ? ?Result Date: 04/10/2022 ?CLINICAL DATA:  Small-bowel obstruction, 24 hour delayed images EXAM: PORTABLE ABDOMEN - 1 VIEW COMPARISON:  Previous studies including the examination of 04/09/2022 FINDINGS: There is interval decrease in degree of small bowel  dilation. Gas and stool are present in colon. There is contrast in the lumen of colon. There is no evidence of contrast in the small bowel loops. Tip of enteric tube is seen in the fundus of the stomach. IMPRESSION: There is interval decrease in degree of small bowel dilation. Electronically Signed   By: Elmer Picker M.D.   On: 04/10/2022 08:32   ? ?Anti-infectives: ?Anti-infectives (From admission, onward)  ? ? None  ? ?  ? ? ? ?Assessment/Plan ?SBO ?- Radiographically and clinically resolving. Follow up film with contrast in the colon. Tolerating liquids without abdominal pain, n/v. Passing flatus. NT on exam with good BS.  Will advance to soft diet. If tolerates and has a bm, okay for d/c from our standpoint. Would recommend staying on a low residue/low fiber diet for a few weeks after discharge.  ?-discussed with primary service ?  ?FEN - Soft ?VTE - SCDs, okay for chemical prophylaxis from a general surgery standpoint ?ID - None  ?Foley - None ?  ?HTN ?COPD ?GERD ? ? LOS: 2 days  ? ? ?Jillyn Ledger , PA-C ?[facility] ?04/11/2022, 11:25 AM ?Please see Amion for pager number during day hours 7:00am-4:30pm ? ?

## 2022-04-17 DIAGNOSIS — K56609 Unspecified intestinal obstruction, unspecified as to partial versus complete obstruction: Secondary | ICD-10-CM | POA: Diagnosis not present

## 2022-04-17 DIAGNOSIS — I714 Abdominal aortic aneurysm, without rupture, unspecified: Secondary | ICD-10-CM | POA: Diagnosis not present

## 2022-04-21 ENCOUNTER — Other Ambulatory Visit (HOSPITAL_COMMUNITY): Payer: Self-pay

## 2022-05-14 NOTE — Progress Notes (Signed)
Cardiology Office Note   Date:  05/15/2022   ID:  Shane Clark, DOB 1951-04-19, MRN [id number]  PCP:  Gaynelle Arabian, MD  Cardiologist:   Dorris Carnes, MD   Pt presents for follow up of CP     History of Present Illness: Shane Clark is a 71 y.o. male with a history of chest pain  The pt has hs had several caths in past that showed no evidence of CAD Last cath in [place] in 2007 was normal  IN June 2022 the pt drove to [employer] he got out of car often  [employer] for  a church meeting   When got back to hotel he felt his pulse thorughout his body   Chest started hurting   Episode   lasted about 20 min    I saw the pt after this event   Ordered cardiac CT CA score was 53   Minimal CAD noted   I last saw the pt in clinic in Oct 2022   He was recently discharged from [facility] for a SBO    Treated conservatively.  The pt says he still is getting intermitt chest pressure, having some right now.  Pressure.   Under left breast, he points to area, rubbing it   Episodes come with and without activity   Not associated with meals    When not having feels fine.   Active  No SOB or CP associated with activity  Pt says bowels moving OK now    He denies problems swalloing      On an acid inhibitor   Denies reflux Working on his diet   Eating less crunch cereal    Current Meds  Medication Sig   acetaminophen (TYLENOL) 500 MG tablet Take 1,000 mg by mouth every 6 (six) hours as needed.   albuterol (VENTOLIN HFA) 108 (90 Base) MCG/ACT inhaler Inhale 1-2 puffs into the lungs every 4 (four) hours as needed.   aspirin EC 81 MG tablet Take 81 mg by mouth daily. Swallow whole.   azelastine (ASTELIN) 0.1 % nasal spray Place 2 sprays into both nostrils 2 (two) times daily as needed for congestion.   budesonide-formoterol (SYMBICORT) 80-4.5 MCG/ACT inhaler Inhale 2 puffs into the lungs 2 (two) times daily.   loratadine (CLARITIN) 10 MG tablet Take 10 mg by mouth daily as needed for allergies.     Multiple Vitamins-Minerals (ZINC PO) Take 1 tablet by mouth daily. CD ZINC   nitroGLYCERIN (NITROSTAT) 0.4 MG SL tablet Place 1 tablet (0.4 mg total) under the tongue as needed.   pantoprazole (PROTONIX) 40 MG tablet Take 1 tablet (40 mg total) by mouth daily for acid reflux   rosuvastatin (CRESTOR) 5 MG tablet Take 1 tablet (5 mg total) by mouth daily.   tamsulosin (FLOMAX) 0.4 MG CAPS capsule Take 1 capsule (0.4 mg total) by mouth 30 minutes after the same meal each day for urinary symtomps   telmisartan (MICARDIS) 20 MG tablet Take 1 tablet (20 mg total) by mouth daily for blood pressure   Zinc 100 MG TABS daily.     Allergies:   Codeine, Percocet [oxycodone-acetaminophen], Angiotensin receptor blockers, Crestor [rosuvastatin], Lyrica [pregabalin], Mevacor [lovastatin], Pravachol [pravastatin], Talwin [pentazocine], Zestril [lisinopril], Zetia [ezetimibe], and Advair diskus [fluticasone-salmeterol]   Past Medical History:  Diagnosis Date   Allergy    Arthritis    Asthma    BPH (benign prostatic hyperplasia)    Chronic back pain    "  all over"   COPD (chronic obstructive pulmonary disease) (HCC)    GERD (gastroesophageal reflux disease)    Hx of adenomatous colonic polyps 03/22/2016   Hypertension    Influenza 11/20/2011   Sepsis ([place]) 02/05/2016    Past Surgical History:  Procedure Laterality Date   ABDOMINAL HERNIA REPAIR     ANTERIOR CERVICAL DECOMP/DISCECTOMY FUSION     C6-8   APPENDECTOMY  1960s   BACK SURGERY     COLONOSCOPY     HERNIA REPAIR     INSERTION DISTRACTION DEVICE LUMBAR POSTERIOR SPINOUS PROCESS     KNEE ARTHROSCOPY Left    SHOULDER SURGERY Left 02/11/2017   SMALL BOWEL REPAIR       Social History:  The patient  reports that he has quit smoking. His smoking use included cigarettes. He has a 2.40 pack-year smoking history. He has quit using smokeless tobacco. He reports that he does not drink alcohol and does not use drugs.   Family History:  The patient's  family history includes Coronary artery disease in his father; Dementia in his mother; Diabetes type II in an other family member; Heart disease in his brother; Pancreatic cancer in his brother; Stroke in his brother.    ROS:  Please see the history of present illness. All other systems are reviewed and  Negative to the above problem except as noted.    PHYSICAL EXAM: VS:  BP 122/68   Pulse 75   Ht '5\' 8"'$  (1.727 m)   Wt 254 lb 3.2 oz (115.3 kg)   SpO2 97%   BMI 38.65 kg/m   GEN: Morbidly obese 71 yo  in no acute distress  HEENT: normal  Neck: no JVD, carotid bruits Cardiac: RRR; no murmurs, No LE edema  Respiratory:  clear to auscultation bilaterally, Chest  Nontender  GI: soft, nontender, nondistended, + BS  MS: no deformity Moving all extremities   Skin: warm and dry, no rash Neuro:  Strength and sensation are intact Psych: euthymic mood, full affect   EKG:  EKG is not ordered today.   Lipid Panel    Component Value Date/Time   CHOL 87 (L) 09/16/2021 0718   TRIG 44 09/16/2021 0718   HDL 39 (L) 09/16/2021 0718   CHOLHDL 2.2 09/16/2021 0718   CHOLHDL 4.8 CALC 03/29/2008 1401   VLDL 12 03/29/2008 1401   LDLCALC 36 09/16/2021 0718      Wt Readings from Last 3 Encounters:  05/15/22 254 lb 3.2 oz (115.3 kg)  04/09/22 263 lb 3.7 oz (119.4 kg)  09/30/21 267 lb (121.1 kg)      ASSESSMENT AND PLAN:  1  chest pressure   Atypical    I do not think cardiac     ? Related to GI if he has a functinoal issue in gut that comes and goes      2  CAD   Minimal on CT scan   Continue risk factor modification   3  Hx Dizziness  Denies     4  HTN  Excellent control  5  HL  Great control    Continue statin     6  Diet   Reviewed   Keep cutting back on carbs   TRE    Follow up in 1 month    Current medicines are reviewed at length with the patient today.  The patient does not have concerns regarding medicines.  Signed, Dorris Carnes, MD  05/15/2022 8:41 AM    [facility] [place], [place], [place]  [address] Phone: [phone]; Fax: [phone]

## 2022-05-15 ENCOUNTER — Ambulatory Visit: Payer: Medicare Other | Admitting: Internal Medicine

## 2022-05-15 ENCOUNTER — Encounter: Payer: Self-pay | Admitting: Internal Medicine

## 2022-05-15 VITALS — BP 122/68 | HR 75 | Ht 68.0 in | Wt 254.2 lb

## 2022-05-15 DIAGNOSIS — I2510 Atherosclerotic heart disease of native coronary artery without angina pectoris: Secondary | ICD-10-CM | POA: Diagnosis not present

## 2022-05-15 NOTE — Patient Instructions (Signed)
Medication Instructions:   *If you need a refill on your cardiac medications before your next appointment, please call your pharmacy*   Lab Work:  If you have labs (blood work) drawn today and your tests are completely normal, you will receive your results only by: [place] (if you have MyChart) OR A paper copy in the mail If you have any lab test that is abnormal or we need to change your treatment, we will call you to review the results.   Testing/Procedures:    Follow-Up: At [facility], you and your health needs are our priority.  As part of our continuing mission to provide you with exceptional heart care, we have created designated Provider Care Teams.  These Care Teams include your primary Cardiologist (physician) and Advanced Practice Providers (APPs -  Physician Assistants and Nurse Practitioners) who all work together to provide you with the care you need, when you need it.  We recommend signing up for the patient portal called "MyChart".  Sign up information is provided on this After Visit Summary.  MyChart is used to connect with patients for Virtual Visits (Telemedicine).  Patients are able to view lab/test results, encounter notes, upcoming appointments, etc.  Non-urgent messages can be sent to your provider as well.   To learn more about what you can do with MyChart, go to [email or web address].    Your next appointment:   1 year(s)  The format for your next appointment:   In Person  Dr Dorris Carnes   If primary card or EP is not listed click here to update    :1}    Other Instructions   Important Information About Sugar

## 2022-05-26 ENCOUNTER — Other Ambulatory Visit (HOSPITAL_COMMUNITY): Payer: Self-pay

## 2022-07-30 ENCOUNTER — Other Ambulatory Visit (HOSPITAL_COMMUNITY): Payer: Self-pay

## 2022-07-31 ENCOUNTER — Other Ambulatory Visit (HOSPITAL_COMMUNITY): Payer: Self-pay

## 2022-08-13 ENCOUNTER — Other Ambulatory Visit (HOSPITAL_COMMUNITY): Payer: Self-pay

## 2022-08-13 ENCOUNTER — Encounter: Payer: Self-pay | Admitting: Orthopaedic Surgery

## 2022-08-13 ENCOUNTER — Ambulatory Visit: Payer: Medicare Other | Admitting: Orthopaedic Surgery

## 2022-08-13 ENCOUNTER — Ambulatory Visit (INDEPENDENT_AMBULATORY_CARE_PROVIDER_SITE_OTHER): Payer: Medicare Other

## 2022-08-13 DIAGNOSIS — M5442 Lumbago with sciatica, left side: Secondary | ICD-10-CM | POA: Diagnosis not present

## 2022-08-13 MED ORDER — DICLOFENAC SODIUM 75 MG PO TBEC
75.0000 mg | DELAYED_RELEASE_TABLET | Freq: Two times a day (BID) | ORAL | 2 refills | Status: DC | PRN
  Filled 2022-08-13: qty 60, 30d supply, fill #0

## 2022-08-13 MED ORDER — METHOCARBAMOL 750 MG PO TABS
750.0000 mg | ORAL_TABLET | Freq: Three times a day (TID) | ORAL | 2 refills | Status: DC | PRN
  Filled 2022-08-13: qty 20, 7d supply, fill #0

## 2022-08-13 MED ORDER — PREDNISONE 10 MG (21) PO TBPK
ORAL_TABLET | ORAL | 0 refills | Status: DC
  Filled 2022-08-13: qty 21, 6d supply, fill #0

## 2022-08-13 NOTE — Progress Notes (Signed)
Office Visit Note   Patient: Shane Clark           Date of Birth: 24-Aug-1951           MRN: [id number] Visit Date: 08/13/2022              Requested by: Gaynelle Arabian, MD [address] [place],  [place] [address] PCP: Gaynelle Arabian, MD   Assessment & Plan: Visit Diagnoses:  1. Acute left-sided low back pain with left-sided sciatica     Plan: Impression is left-sided back pain from recent injury.  At this point, do not see evidence of fracture on x-ray.  I would like to start him on a steroid taper and muscle relaxer.  We will also extension anti-inflammatories to take once he is finished with the steroid.  If his symptoms do not improve or worsen once he is finished with the steroid taper he will let us know we will get an MRI of the lumbar spine to assess for structural abnormalities.  Otherwise, follow-up as needed.  Call with concerns or questions.  Follow-Up Instructions: Return if symptoms worsen or fail to improve.   Orders:  Orders Placed This Encounter  Procedures   XR Lumbar Spine 2-3 Views   Meds ordered this encounter  Medications   predniSONE (STERAPRED UNI-PAK 21 TAB) 10 MG (21) TBPK tablet    Sig: Take as directed    Dispense:  21 tablet    Refill:  0   methocarbamol (ROBAXIN-750) 750 MG tablet    Sig: Take 1 tablet (750 mg total) by mouth every 8 (eight) hours as needed for muscle spasms.    Dispense:  20 tablet    Refill:  2      Procedures: No procedures performed   Clinical Data: No additional findings.   Subjective: Chief Complaint  Patient presents with   Lower Back - Pain    HPI patient is a pleasant 71 year old gentleman who comes in today with left-sided back pain turn [address]-old way and felt his severe pain to the left lower back which caused him to get his knees.  His symptoms have progressively worsened over the past several weeks.  The pain is to the left lower back without radiation down the leg although he  is recently noticed burning to the anterior thigh.  He describes a constant pain worse with twisting and turning.  He has been taking Tylenol and ibuprofen as well as using Salonpas without relief.  He denies any bowel or bladder change or saddle paresthesias.  He does note a history of what sounds like lumbar fusion L1 to Dr. Lawana Chambers back in 1988.  Review of Systems as detailed in HPI.  All others reviewed are negative.   Objective: Vital Signs: There were no vitals taken for this visit.  Physical Exam well-developed well-nourished gentleman in no acute distress.  Alert and oriented x3.  Ortho Exam lumbar spine exam reveals mild to moderate spinous and paraspinous tenderness on the left.  Increased pain with lumbar rotation.  No pain with flexion or extension.  Negative straight leg raise.  No focal weakness.  He is neurovascular intact distally.  Specialty Comments:  No specialty comments available.  Imaging: No results found.   PMFS History: Patient Active Problem List   Diagnosis Date Noted   GERD (gastroesophageal reflux disease)    HLD (hyperlipidemia)    Primary osteoarthritis of right knee 01/24/2021   Primary osteoarthritis of left knee 01/24/2021  SBO (small bowel obstruction) ([place]) 02/13/2018   Obesity (BMI 30-39.9) 02/16/2017   Small bowel obstruction ([place]) 02/15/2017   SLAP tear of shoulder 02/11/2017   Impingement syndrome of left shoulder 02/05/2017   Hx of adenomatous colonic polyps 03/22/2016   Essential hypertension 02/05/2016   COPD (chronic obstructive pulmonary disease) ([place]) 48/88/9169   Diastolic dysfunction 45/01/8881   BPH (benign prostatic hyperplasia) 11/21/2011   Hypotension 11/20/2011   Past Medical History:  Diagnosis Date   Allergy    Arthritis    Asthma    BPH (benign prostatic hyperplasia)    Chronic back pain    "all over"   COPD (chronic obstructive pulmonary disease) (HCC)    GERD (gastroesophageal reflux disease)    Hx of  adenomatous colonic polyps 03/22/2016   Hypertension    Influenza 11/20/2011   Sepsis ([place]) 02/05/2016    Family History  Problem Relation Age of Onset   Coronary artery disease Father    Pancreatic cancer Brother    Stroke Brother    Heart disease Brother    Dementia Mother    Diabetes type II Other    Colon cancer Neg Hx    Colon polyps Neg Hx    Rectal cancer Neg Hx    Stomach cancer Neg Hx     Past Surgical History:  Procedure Laterality Date   ABDOMINAL HERNIA REPAIR     ANTERIOR CERVICAL DECOMP/DISCECTOMY FUSION     C6-8   APPENDECTOMY  1960s   BACK SURGERY     COLONOSCOPY     HERNIA REPAIR     INSERTION DISTRACTION DEVICE LUMBAR POSTERIOR SPINOUS PROCESS     KNEE ARTHROSCOPY Left    SHOULDER SURGERY Left 02/11/2017   SMALL BOWEL REPAIR     Social History   Occupational History   Not on file  Tobacco Use   Smoking status: Former    Packs/day: 0.12    Years: 20.00    Total pack years: 2.40    Types: Cigarettes   Smokeless tobacco: Former   Tobacco comments:    "quit smoking cigarettes in the 1980s"  Substance and Sexual Activity   Alcohol use: No    Alcohol/week: 0.0 standard drinks of alcohol   Drug use: No   Sexual activity: Yes

## 2022-08-23 ENCOUNTER — Other Ambulatory Visit: Payer: Self-pay

## 2022-08-23 ENCOUNTER — Emergency Department (HOSPITAL_COMMUNITY): Payer: Medicare Other

## 2022-08-23 ENCOUNTER — Observation Stay (HOSPITAL_COMMUNITY): Payer: Medicare Other

## 2022-08-23 ENCOUNTER — Encounter (HOSPITAL_COMMUNITY): Payer: Self-pay

## 2022-08-23 ENCOUNTER — Inpatient Hospital Stay (HOSPITAL_COMMUNITY)
Admission: EM | Admit: 2022-08-23 | Discharge: 2022-08-26 | DRG: 389 | Disposition: A | Discharge status: Home / Self Care | Payer: Medicare Other | Attending: Internal Medicine | Admitting: Internal Medicine

## 2022-08-23 DIAGNOSIS — Z7982 Long term (current) use of aspirin: Secondary | ICD-10-CM

## 2022-08-23 DIAGNOSIS — Z7951 Long term (current) use of inhaled steroids: Secondary | ICD-10-CM | POA: Diagnosis not present

## 2022-08-23 DIAGNOSIS — I10 Essential (primary) hypertension: Secondary | ICD-10-CM | POA: Diagnosis not present

## 2022-08-23 DIAGNOSIS — J449 Chronic obstructive pulmonary disease, unspecified: Secondary | ICD-10-CM | POA: Diagnosis not present

## 2022-08-23 DIAGNOSIS — E782 Mixed hyperlipidemia: Secondary | ICD-10-CM | POA: Diagnosis present

## 2022-08-23 DIAGNOSIS — Z800 Family history of malignant neoplasm of digestive organs: Secondary | ICD-10-CM

## 2022-08-23 DIAGNOSIS — Z8249 Family history of ischemic heart disease and other diseases of the circulatory system: Secondary | ICD-10-CM | POA: Diagnosis not present

## 2022-08-23 DIAGNOSIS — N400 Enlarged prostate without lower urinary tract symptoms: Secondary | ICD-10-CM | POA: Diagnosis present

## 2022-08-23 DIAGNOSIS — I110 Hypertensive heart disease with heart failure: Secondary | ICD-10-CM | POA: Diagnosis present

## 2022-08-23 DIAGNOSIS — Z66 Do not resuscitate: Secondary | ICD-10-CM | POA: Diagnosis present

## 2022-08-23 DIAGNOSIS — K56600 Partial intestinal obstruction, unspecified as to cause: Secondary | ICD-10-CM | POA: Diagnosis not present

## 2022-08-23 DIAGNOSIS — Z87891 Personal history of nicotine dependence: Secondary | ICD-10-CM

## 2022-08-23 DIAGNOSIS — R109 Unspecified abdominal pain: Secondary | ICD-10-CM | POA: Diagnosis not present

## 2022-08-23 DIAGNOSIS — Z4682 Encounter for fitting and adjustment of non-vascular catheter: Secondary | ICD-10-CM | POA: Diagnosis not present

## 2022-08-23 DIAGNOSIS — Z833 Family history of diabetes mellitus: Secondary | ICD-10-CM | POA: Diagnosis not present

## 2022-08-23 DIAGNOSIS — Z79899 Other long term (current) drug therapy: Secondary | ICD-10-CM | POA: Diagnosis not present

## 2022-08-23 DIAGNOSIS — Z9049 Acquired absence of other specified parts of digestive tract: Secondary | ICD-10-CM

## 2022-08-23 DIAGNOSIS — Z823 Family history of stroke: Secondary | ICD-10-CM | POA: Diagnosis not present

## 2022-08-23 DIAGNOSIS — K5669 Other partial intestinal obstruction: Secondary | ICD-10-CM | POA: Diagnosis not present

## 2022-08-23 DIAGNOSIS — I700 Atherosclerosis of aorta: Secondary | ICD-10-CM | POA: Diagnosis not present

## 2022-08-23 DIAGNOSIS — K56609 Unspecified intestinal obstruction, unspecified as to partial versus complete obstruction: Secondary | ICD-10-CM

## 2022-08-23 DIAGNOSIS — I5032 Chronic diastolic (congestive) heart failure: Secondary | ICD-10-CM | POA: Diagnosis not present

## 2022-08-23 DIAGNOSIS — R112 Nausea with vomiting, unspecified: Secondary | ICD-10-CM | POA: Diagnosis present

## 2022-08-23 DIAGNOSIS — K219 Gastro-esophageal reflux disease without esophagitis: Secondary | ICD-10-CM | POA: Diagnosis present

## 2022-08-23 LAB — COMPREHENSIVE METABOLIC PANEL
ALT: 39 U/L (ref 0–44)
AST: 34 U/L (ref 15–41)
Albumin: 3.9 g/dL (ref 3.5–5.0)
Alkaline Phosphatase: 68 U/L (ref 38–126)
Anion gap: 11 (ref 5–15)
BUN: 24 mg/dL — ABNORMAL HIGH (ref 8–23)
CO2: 22 mmol/L (ref 22–32)
Calcium: 9 mg/dL (ref 8.9–10.3)
Chloride: 103 mmol/L (ref 98–111)
Creatinine, Ser: 1.23 mg/dL (ref 0.61–1.24)
GFR, Estimated: >60 mL/min (ref >60)
Glucose, Bld: 106 mg/dL — ABNORMAL HIGH (ref 70–99)
Potassium: 4.8 mmol/L (ref 3.5–5.1)
Sodium: 136 mmol/L (ref 135–145)
Total Bilirubin: 1.5 mg/dL — ABNORMAL HIGH (ref 0.3–1.2)
Total Protein: 7.2 g/dL (ref 6.5–8.1)

## 2022-08-23 LAB — URINALYSIS, ROUTINE W REFLEX MICROSCOPIC
Bacteria, UA: NONE SEEN
Bilirubin Urine: NEGATIVE
Glucose, UA: NEGATIVE mg/dL
Hgb urine dipstick: NEGATIVE
Ketones, ur: 20 mg/dL — AB
Nitrite: NEGATIVE
Protein, ur: NEGATIVE mg/dL
Specific Gravity, Urine: 1.035 — ABNORMAL HIGH (ref 1.005–1.030)
pH: 6 (ref 5.0–8.0)

## 2022-08-23 LAB — CBC WITH DIFFERENTIAL/PLATELET
Abs Immature Granulocytes: 0.06 10*3/uL (ref 0.00–0.07)
Basophils Absolute: 0 10*3/uL (ref 0.0–0.1)
Basophils Relative: 0 %
Eosinophils Absolute: 0.1 10*3/uL (ref 0.0–0.5)
Eosinophils Relative: 0 %
HCT: 43.8 % (ref 39.0–52.0)
Hemoglobin: 14.8 g/dL (ref 13.0–17.0)
Immature Granulocytes: 1 %
Lymphocytes Relative: 8 %
Lymphs Abs: 1.1 10*3/uL (ref 0.7–4.0)
MCH: 32.6 pg (ref 26.0–34.0)
MCHC: 33.8 g/dL (ref 30.0–36.0)
MCV: 96.5 fL (ref 80.0–100.0)
Monocytes Absolute: 1 10*3/uL (ref 0.1–1.0)
Monocytes Relative: 8 %
Neutro Abs: 10.6 10*3/uL — ABNORMAL HIGH (ref 1.7–7.7)
Neutrophils Relative %: 83 %
Platelets: 232 10*3/uL (ref 150–400)
RBC: 4.54 MIL/uL (ref 4.22–5.81)
RDW: 13.4 % (ref 11.5–15.5)
WBC: 12.8 10*3/uL — ABNORMAL HIGH (ref 4.0–10.5)
nRBC: 0 % (ref 0.0–0.2)

## 2022-08-23 LAB — PROTIME-INR
INR: 1 (ref 0.8–1.2)
Prothrombin Time: 13.5 seconds (ref 11.4–15.2)

## 2022-08-23 LAB — LACTIC ACID, PLASMA: Lactic Acid, Venous: 2.4 mmol/L (ref 0.5–1.9)

## 2022-08-23 LAB — LIPASE, BLOOD: Lipase: 32 U/L (ref 11–51)

## 2022-08-23 MED ORDER — ACETAMINOPHEN 650 MG RE SUPP: 650.0000 mg | Freq: Four times a day (QID) | RECTAL | Status: DC | PRN

## 2022-08-23 MED ORDER — HYDRALAZINE HCL 20 MG/ML IJ SOLN: 10.0000 mg | Freq: Four times a day (QID) | INTRAMUSCULAR | Status: DC | PRN

## 2022-08-23 MED ORDER — ONDANSETRON HCL 4 MG/2ML IJ SOLN: 4.0000 mg | Freq: Four times a day (QID) | INTRAMUSCULAR | Status: DC | PRN

## 2022-08-23 MED ORDER — DIATRIZOATE MEGLUMINE & SODIUM 66-10 % PO SOLN
90.0000 mL | Freq: Once | ORAL | Status: AC
  Administered 2022-08-23: 90 mL via NASOGASTRIC
  Filled 2022-08-23: qty 90

## 2022-08-23 MED ORDER — IPRATROPIUM-ALBUTEROL 0.5-2.5 (3) MG/3ML IN SOLN: 3.0000 mL | RESPIRATORY_TRACT | Status: DC | PRN

## 2022-08-23 MED ORDER — ONDANSETRON HCL 4 MG/2ML IJ SOLN
4.0000 mg | Freq: Once | INTRAMUSCULAR | Status: AC
  Administered 2022-08-23: 4 mg via INTRAVENOUS
  Filled 2022-08-23: qty 2

## 2022-08-23 MED ORDER — ACETAMINOPHEN 325 MG PO TABS
650.0000 mg | ORAL_TABLET | Freq: Four times a day (QID) | ORAL | Status: DC | PRN
  Filled 2022-08-23: qty 2

## 2022-08-23 MED ORDER — HYDROMORPHONE HCL 1 MG/ML IJ SOLN
0.5000 mg | Freq: Once | INTRAMUSCULAR | Status: AC
  Administered 2022-08-23: 0.5 mg via INTRAVENOUS
  Filled 2022-08-23 (×2): qty 1

## 2022-08-23 MED ORDER — NITROGLYCERIN 0.4 MG SL SUBL: 0.4000 mg | SUBLINGUAL_TABLET | SUBLINGUAL | Status: DC | PRN

## 2022-08-23 MED ORDER — AZELASTINE HCL 0.1 % NA SOLN: 2.0000 | Freq: Two times a day (BID) | NASAL | Status: DC | PRN

## 2022-08-23 MED ORDER — ENOXAPARIN SODIUM 40 MG/0.4ML IJ SOSY
40.0000 mg | PREFILLED_SYRINGE | INTRAMUSCULAR | Status: DC
  Administered 2022-08-24 – 2022-08-25 (×2): 40 mg via SUBCUTANEOUS
  Filled 2022-08-23 (×2): qty 0.4

## 2022-08-23 MED ORDER — ONDANSETRON HCL 4 MG PO TABS: 4.0000 mg | ORAL_TABLET | Freq: Four times a day (QID) | ORAL | Status: DC | PRN

## 2022-08-23 MED ORDER — PANTOPRAZOLE SODIUM 40 MG IV SOLR
40.0000 mg | Freq: Every day | INTRAVENOUS | Status: DC
  Administered 2022-08-24 – 2022-08-25 (×3): 40 mg via INTRAVENOUS
  Filled 2022-08-23 (×2): qty 10

## 2022-08-23 MED ORDER — MORPHINE SULFATE (PF) 4 MG/ML IV SOLN: 4.0000 mg | INTRAVENOUS | Status: DC | PRN

## 2022-08-23 MED ORDER — MOMETASONE FURO-FORMOTEROL FUM 100-5 MCG/ACT IN AERO
2.0000 | INHALATION_SPRAY | Freq: Two times a day (BID) | RESPIRATORY_TRACT | Status: DC
  Administered 2022-08-24 – 2022-08-26 (×4): 2 via RESPIRATORY_TRACT
  Filled 2022-08-23: qty 8.8

## 2022-08-23 MED ORDER — POTASSIUM CHLORIDE 2 MEQ/ML IV SOLN
INTRAVENOUS | Status: DC
  Filled 2022-08-23 (×8): qty 1000

## 2022-08-23 MED ORDER — MORPHINE SULFATE (PF) 2 MG/ML IV SOLN
2.0000 mg | INTRAVENOUS | Status: DC | PRN
  Administered 2022-08-24: 2 mg via INTRAVENOUS
  Filled 2022-08-23: qty 1

## 2022-08-23 MED ORDER — IOHEXOL 350 MG/ML SOLN
75.0000 mL | Freq: Once | INTRAVENOUS | Status: AC | PRN
  Administered 2022-08-23: 75 mL via INTRAVENOUS

## 2022-08-23 MED ORDER — LACTATED RINGERS IV BOLUS
1000.0000 mL | Freq: Once | INTRAVENOUS | Status: AC
Start: 2022-08-23 — End: 2022-08-23
  Administered 2022-08-23: 1000 mL via INTRAVENOUS

## 2022-08-23 NOTE — Consult Note (Signed)
CC/Reason for consult: Small bowel obstruction  Requesting physician: Cindee Lame MD  HPI: Shane Clark is an 71 y.o. male with hx of COPD, BPH, HTN, CHF, and history of prior/recurrent small bowel obstructions-last in May, 2023.  Patient has a history prior appendectomy at age 74, Exploratory laparotomy and umbilical hernia repair in 2003 by Dr. Ninfa Linden, and exploratory laparotomy lysis of adhesions on 12/15/2010 by Dr. Donne Hazel. He has hx of prior SBO in 2018, 2019 and 2021 that were tx w/ medical management w/ resolution of symptoms w/ bowel rest w/o NGT. He takes a baby asa daily. No other antiplatelet or any anticoagulation medications. He has been admitted to [facility] and we were asked to see.   Presented today with acute onset of abdominal discomfort beginning approximately 16 hours ago.  This has been more in the generalized location of his abdomen but not 1 specific area.  He does report he had some loose stool this morning.  He believes he is also passing gas today.  He feels very similar to his prior small bowel obstructions. Associated n/v, asking for NG tube.   Past Medical History:  Diagnosis Date   Allergy    Arthritis    Asthma    BPH (benign prostatic hyperplasia)    Chronic back pain    "all over"   COPD (chronic obstructive pulmonary disease) (HCC)    GERD (gastroesophageal reflux disease)    Hx of adenomatous colonic polyps 03/22/2016   Hypertension    Influenza 11/20/2011   Sepsis ([place]) 02/05/2016    Past Surgical History:  Procedure Laterality Date   ABDOMINAL HERNIA REPAIR     ANTERIOR CERVICAL DECOMP/DISCECTOMY FUSION     C6-8   APPENDECTOMY  1960s   BACK SURGERY     COLONOSCOPY     HERNIA REPAIR     INSERTION DISTRACTION DEVICE LUMBAR POSTERIOR SPINOUS PROCESS     KNEE ARTHROSCOPY Left    SHOULDER SURGERY Left 02/11/2017   SMALL BOWEL REPAIR      Family History  Problem Relation Age of Onset   Coronary artery disease Father    Pancreatic cancer  Brother    Stroke Brother    Heart disease Brother    Dementia Mother    Diabetes type II Other    Colon cancer Neg Hx    Colon polyps Neg Hx    Rectal cancer Neg Hx    Stomach cancer Neg Hx     Social:  reports that he has quit smoking. His smoking use included cigarettes. He has a 2.40 pack-year smoking history. He has quit using smokeless tobacco. He reports that he does not drink alcohol and does not use drugs.  Allergies:  Allergies  Allergen Reactions   Codeine Shortness Of Breath   Percocet [Oxycodone-Acetaminophen] Anaphylaxis   Angiotensin Receptor Blockers Other (See Comments)    Dyspnea   Crestor [Rosuvastatin]     Other reaction(s): Feet Hurt   Lyrica [Pregabalin] Other (See Comments)    Headache   Mevacor [Lovastatin]     Joint Pain   Pravachol [Pravastatin] Other (See Comments)   Talwin [Pentazocine] Nausea Only and Other (See Comments)    Stopped breathing   Zestril [Lisinopril] Other (See Comments)    Dyspnea   Zetia [Ezetimibe] Itching   Advair Diskus [Fluticasone-Salmeterol] Rash    unknown    Medications: I have reviewed the patient's current medications.  Results for orders placed or performed during the hospital encounter of 08/23/22 (from  the past 48 hour(s))  Lactic acid, plasma     Status: Abnormal   Collection Time: 08/23/22  2:36 PM  Result Value Ref Range   Lactic Acid, Venous 2.4 (HH) 0.5 - 1.9 mmol/L    Comment: CRITICAL RESULT CALLED TO, READ BACK BY AND VERIFIED WITH C,WILLIAMS RN '@1531'$  08/23/22 E,[place] Performed at [facility], [place] [address], [place], [place] [address]   Comprehensive metabolic panel     Status: Abnormal   Collection Time: 08/23/22  2:37 PM  Result Value Ref Range   Sodium 136 135 - 145 mmol/L   Potassium 4.8 3.5 - 5.1 mmol/L   Chloride 103 98 - 111 mmol/L   CO2 22 22 - 32 mmol/L   Glucose, Bld 106 (H) 70 - 99 mg/dL    Comment: Glucose reference range applies only to samples taken after fasting for at least  8 hours.   BUN 24 (H) 8 - 23 mg/dL   Creatinine, Ser 1.23 0.61 - 1.24 mg/dL   Calcium 9.0 8.9 - 10.3 mg/dL   Total Protein 7.2 6.5 - 8.1 g/dL   Albumin 3.9 3.5 - 5.0 g/dL   AST 34 15 - 41 U/L   ALT 39 0 - 44 U/L   Alkaline Phosphatase 68 38 - 126 U/L   Total Bilirubin 1.5 (H) 0.3 - 1.2 mg/dL   GFR, Estimated >60 >60 mL/min    Comment: (NOTE) Calculated using the CKD-EPI Creatinine Equation (2021)    Anion gap 11 5 - 15    Comment: Performed at [facility] [address], [place], [place] [address]  Lipase, blood     Status: None   Collection Time: 08/23/22  2:37 PM  Result Value Ref Range   Lipase 32 11 - 51 U/L    Comment: Performed at [facility], [place] [address], [place], [place] [address]  CBC with Diff     Status: Abnormal   Collection Time: 08/23/22  2:37 PM  Result Value Ref Range   WBC 12.8 (H) 4.0 - 10.5 K/uL   RBC 4.54 4.22 - 5.81 MIL/uL   Hemoglobin 14.8 13.0 - 17.0 g/dL   HCT 43.8 39.0 - 52.0 %   MCV 96.5 80.0 - 100.0 fL   MCH 32.6 26.0 - 34.0 pg   MCHC 33.8 30.0 - 36.0 g/dL   RDW 13.4 11.5 - 15.5 %   Platelets 232 150 - 400 K/uL   nRBC 0.0 0.0 - 0.2 %   Neutrophils Relative % 83 %   Neutro Abs 10.6 (H) 1.7 - 7.7 K/uL   Lymphocytes Relative 8 %   Lymphs Abs 1.1 0.7 - 4.0 K/uL   Monocytes Relative 8 %   Monocytes Absolute 1.0 0.1 - 1.0 K/uL   Eosinophils Relative 0 %   Eosinophils Absolute 0.1 0.0 - 0.5 K/uL   Basophils Relative 0 %   Basophils Absolute 0.0 0.0 - 0.1 K/uL   Immature Granulocytes 1 %   Abs Immature Granulocytes 0.06 0.00 - 0.07 K/uL    Comment: Performed at [facility] [address], [place],  [address]  Protime-INR     Status: None   Collection Time: 08/23/22  2:37 PM  Result Value Ref Range   Prothrombin Time 13.5 11.4 - 15.2 seconds   INR 1.0 0.8 - 1.2    Comment: (NOTE) INR goal varies based on device and disease states. Performed at [facility], [place]  [address], [place], [place] [address]    Urinalysis, Routine w reflex microscopic     Status: Abnormal   Collection Time: 08/23/22  7:13 PM  Result Value Ref Range   Color, Urine YELLOW YELLOW   APPearance CLEAR CLEAR   Specific Gravity, Urine 1.035 (H) 1.005 - 1.030   pH 6.0 5.0 - 8.0   Glucose, UA NEGATIVE NEGATIVE mg/dL   Hgb urine dipstick NEGATIVE NEGATIVE   Bilirubin Urine NEGATIVE NEGATIVE   Ketones, ur 20 (A) NEGATIVE mg/dL   Protein, ur NEGATIVE NEGATIVE mg/dL   Nitrite NEGATIVE NEGATIVE   Leukocytes,Ua SMALL (A) NEGATIVE   RBC / HPF 0-5 0 - 5 RBC/hpf   WBC, UA 6-10 0 - 5 WBC/hpf   Bacteria, UA NONE SEEN NONE SEEN   Squamous Epithelial / LPF 0-5 0 - 5   Mucus PRESENT     Comment: Performed at [facility], [address], [place], [place] [address]    CT ABDOMEN PELVIS W CONTRAST  Result Date: 08/23/2022 CLINICAL DATA:  Bowel obstruction suspected.  Abdominal pain EXAM: CT ABDOMEN AND PELVIS WITH CONTRAST TECHNIQUE: Multidetector CT imaging of the abdomen and pelvis was performed using the standard protocol following bolus administration of intravenous contrast. RADIATION DOSE REDUCTION: This exam was performed according to the departmental dose-optimization program which includes automated exposure control, adjustment of the mA and/or kV according to patient size and/or use of iterative reconstruction technique. CONTRAST:  45m OMNIPAQUE IOHEXOL 350 MG/ML SOLN COMPARISON:  04/08/2022 FINDINGS: Lower chest: No acute findings Hepatobiliary: No focal hepatic abnormality. Gallbladder unremarkable. Pancreas: No focal abnormality or ductal dilatation. Spleen: No focal abnormality.  Normal size. Adrenals/Urinary Tract: No adrenal abnormality. No focal renal abnormality. No stones or hydronephrosis. Urinary bladder is unremarkable. Stomach/Bowel: Stomach and proximal to mid small bowel dilated and fluid-filled compatible with small bowel obstruction. Transition point seems to be in the anterior lower abdomen in a similar  location to prior obstruction. Distal small bowel decompressed as is the colon, grossly unremarkable. Vascular/Lymphatic: Aortic atherosclerosis. No evidence of aneurysm or adenopathy. Reproductive: Prostate enlargement. Other: No free fluid or free air. Musculoskeletal: No acute bony abnormality. Degenerative changes in the lumbar spine. IMPRESSION: Evidence of partial small bowel obstruction. Transition point appears to be in lower abdomen in similar position to prior bowel obstruction. Aortic atherosclerosis. Electronically Signed   By: KRolm BaptiseM.D.   On: 08/23/2022 18:18    ROS - all of the below systems have been reviewed with the patient and positives are indicated with bold text General: chills, fever or night sweats Eyes: blurry vision or double vision ENT: epistaxis or sore throat Allergy/Immunology: itchy/watery eyes or nasal congestion Hematologic/Lymphatic: bleeding problems, blood clots or swollen lymph nodes Endocrine: temperature intolerance or unexpected weight changes Breast: new or changing breast lumps or nipple discharge Resp: cough, shortness of breath, or wheezing CV: chest pain or dyspnea on exertion GI: as per HPI GU: dysuria, trouble voiding, or hematuria MSK: joint pain or joint stiffness Neuro: TIA or stroke symptoms Derm: pruritus and skin lesion changes Psych: anxiety and depression  PE Blood pressure 110/75, pulse 78, temperature 98 F (36.7 C), resp. rate 16, height '5\' 8"'$  (1.727 m), weight 117 kg, SpO2 98 %. Constitutional: NAD; conversant Eyes: Moist conjunctiva Lungs: Normal respiratory effort CV: RRR; no pitting edema GI: Abd soft, moderately distended, mildly tender without rebound nor guarding Psychiatric: Appropriate affect; alert and oriented x3   Results for orders placed or performed during the hospital  encounter of 08/23/22 (from the past 48 hour(s))  Lactic acid, plasma     Status: Abnormal   Collection Time: 08/23/22  2:36 PM  Result  Value Ref Range   Lactic Acid, Venous 2.4 (HH) 0.5 - 1.9 mmol/L    Comment: CRITICAL RESULT CALLED TO, READ BACK BY AND VERIFIED WITH C,WILLIAMS RN '@1531'$  08/23/22 E,[place] Performed at [facility], [place] [address] [place], [place] [address]   Comprehensive metabolic panel     Status: Abnormal   Collection Time: 08/23/22  2:37 PM  Result Value Ref Range   Sodium 136 135 - 145 mmol/L   Potassium 4.8 3.5 - 5.1 mmol/L   Chloride 103 98 - 111 mmol/L   CO2 22 22 - 32 mmol/L   Glucose, Bld 106 (H) 70 - 99 mg/dL    Comment: Glucose reference range applies only to samples taken after fasting for at least 8 hours.   BUN 24 (H) 8 - 23 mg/dL   Creatinine, Ser 1.23 0.61 - 1.24 mg/dL   Calcium 9.0 8.9 - 10.3 mg/dL   Total Protein 7.2 6.5 - 8.1 g/dL   Albumin 3.9 3.5 - 5.0 g/dL   AST 34 15 - 41 U/L   ALT 39 0 - 44 U/L   Alkaline Phosphatase 68 38 - 126 U/L   Total Bilirubin 1.5 (H) 0.3 - 1.2 mg/dL   GFR, Estimated >60 >60 mL/min    Comment: (NOTE) Calculated using the CKD-EPI Creatinine Equation (2021)    Anion gap 11 5 - 15    Comment: Performed at [facility] [address] [place], [place] [address]  Lipase, blood     Status: None   Collection Time: 08/23/22  2:37 PM  Result Value Ref Range   Lipase 32 11 - 51 U/L    Comment: Performed at [facility], [place] [address], [place], [place] [address]  CBC with Diff     Status: Abnormal   Collection Time: 08/23/22  2:37 PM  Result Value Ref Range   WBC 12.8 (H) 4.0 - 10.5 K/uL   RBC 4.54 4.22 - 5.81 MIL/uL   Hemoglobin 14.8 13.0 - 17.0 g/dL   HCT 43.8 39.0 - 52.0 %   MCV 96.5 80.0 - 100.0 fL   MCH 32.6 26.0 - 34.0 pg   MCHC 33.8 30.0 - 36.0 g/dL   RDW 13.4 11.5 - 15.5 %   Platelets 232 150 - 400 K/uL   nRBC 0.0 0.0 - 0.2 %   Neutrophils Relative % 83 %   Neutro Abs 10.6 (H) 1.7 - 7.7 K/uL   Lymphocytes Relative 8 %   Lymphs Abs 1.1 0.7 - 4.0 K/uL   Monocytes Relative 8 %   Monocytes Absolute 1.0 0.1 - 1.0 K/uL    Eosinophils Relative 0 %   Eosinophils Absolute 0.1 0.0 - 0.5 K/uL   Basophils Relative 0 %   Basophils Absolute 0.0 0.0 - 0.1 K/uL   Immature Granulocytes 1 %   Abs Immature Granulocytes 0.06 0.00 - 0.07 K/uL    Comment: Performed at [facility] [address], [place], [place] [address]  Protime-INR     Status: None   Collection Time: 08/23/22  2:37 PM  Result Value Ref Range   Prothrombin Time 13.5 11.4 - 15.2 seconds   INR 1.0 0.8 - 1.2    Comment: (NOTE) INR goal varies based on device and disease states. Performed at [facility], [place] [address] [place], [place] [address]   Urinalysis, Routine w reflex microscopic     Status: Abnormal   Collection Time: 08/23/22  7:13 PM  Result Value Ref Range   Color, Urine YELLOW YELLOW   APPearance CLEAR CLEAR   Specific Gravity, Urine 1.035 (H) 1.005 - 1.030   pH 6.0 5.0 - 8.0   Glucose, UA NEGATIVE NEGATIVE mg/dL   Hgb urine dipstick NEGATIVE NEGATIVE   Bilirubin Urine NEGATIVE NEGATIVE   Ketones, ur 20 (A) NEGATIVE mg/dL   Protein, ur NEGATIVE NEGATIVE mg/dL   Nitrite NEGATIVE NEGATIVE   Leukocytes,Ua SMALL (A) NEGATIVE   RBC / HPF 0-5 0 - 5 RBC/hpf   WBC, UA 6-10 0 - 5 WBC/hpf   Bacteria, UA NONE SEEN NONE SEEN   Squamous Epithelial / LPF 0-5 0 - 5   Mucus PRESENT     Comment: Performed at [facility], [place] [address] [place], [place] [address]    CT ABDOMEN PELVIS W CONTRAST  Result Date: 08/23/2022 CLINICAL DATA:  Bowel obstruction suspected.  Abdominal pain EXAM: CT ABDOMEN AND PELVIS WITH CONTRAST TECHNIQUE: Multidetector CT imaging of the abdomen and pelvis was performed using the standard protocol following bolus administration of intravenous contrast. RADIATION DOSE REDUCTION: This exam was performed according to the departmental dose-optimization program which includes automated exposure control, adjustment of the mA and/or kV according to patient size and/or use of iterative reconstruction  technique. CONTRAST:  44m OMNIPAQUE IOHEXOL 350 MG/ML SOLN COMPARISON:  04/08/2022 FINDINGS: Lower chest: No acute findings Hepatobiliary: No focal hepatic abnormality. Gallbladder unremarkable. Pancreas: No focal abnormality or ductal dilatation. Spleen: No focal abnormality.  Normal size. Adrenals/Urinary Tract: No adrenal abnormality. No focal renal abnormality. No stones or hydronephrosis. Urinary bladder is unremarkable. Stomach/Bowel: Stomach and proximal to mid small bowel dilated and fluid-filled compatible with small bowel obstruction. Transition point seems to be in the anterior lower abdomen in a similar location to prior obstruction. Distal small bowel decompressed as is the colon, grossly unremarkable. Vascular/Lymphatic: Aortic atherosclerosis. No evidence of aneurysm or adenopathy. Reproductive: Prostate enlargement. Other: No free fluid or free air. Musculoskeletal: No acute bony abnormality. Degenerative changes in the lumbar spine. IMPRESSION: Evidence of partial small bowel obstruction. Transition point appears to be in lower abdomen in similar position to prior bowel obstruction. Aortic atherosclerosis. Electronically Signed   By: KRolm BaptiseM.D.   On: 08/23/2022 18:18      A/P: RCHUN SELLENis an 71y.o. male with COPD, BPH, HTN, CHF, and history of prior/recurrent small bowel obstructions - now with similar appearing SBO  -NPO, MIVF -NG tube to low intermittent wall suction -SBO protocol ordered -His daughter is at bedside. Reviewed all above with the patient as well as plans moving forward.  All of his questions were answered and he expressed understanding and agreement with the plan. -We will follow with you  I spent a total of 65 minutes in both face-to-face and non-face-to-face activities, excluding procedures performed, for this visit on the date of this encounter.  CNadeen Landau MPinevilleSurgery, AMaramec

## 2022-08-23 NOTE — ED Notes (Signed)
Transported pt to room 14 and notified primary RN and MD

## 2022-08-23 NOTE — Assessment & Plan Note (Signed)
   Patient presenting with 1 day history of abdominal pain nausea and vomiting  CT imaging revealing consistent with partial small bowel obstruction.    ER providers already discussed case with Dr. Dema Severin with general surgery who has come to see the patient in consultation at the bedside, his input is appreciated.    Patient currently n.p.o.  Status post NG tube placement set to low intermittent suction  Hydrating patient with intravenous isotonic fluids  As needed antiemetics  As needed opiate-based analgesics for associated pain

## 2022-08-23 NOTE — H&P (Incomplete)
History and Physical    Patient: Shane Clark MRN: [id number] DOA: 08/23/2022  Date of Service: the patient was seen and examined on 08/24/2022  Patient coming from: Home  Chief Complaint:  Chief Complaint  Patient presents with   Abdominal Pain    SBO hx    HPI:   71 year old male with past medical history of diastolic congestive heart failure (Echo 07/2021 EF 65-70%)  benign prostatic hyperplasia, COPD, gastroesophageal reflux's, hypertension, hyperlipidemia and pertinent history of multiple hernia repairs in the past with previous small bowel stricture and multiple small bowel obstructions in the past who presents to [facility] emergency department with complaints of abdominal pain nausea and vomiting.  Patient explains that approximately 230 this morning he began to experience sudden onset intense nausea.  This intense nausea was followed by frequent bouts of bilious nonbloody vomiting.  Patient also complains of associated increasing abdominal distention and generalized abdominal pain.  Abdominal pain is dull in quality diffuse and nonradiating.  Pain is worse with movement.  As the morning progressed symptoms continue to persist and were associated with several bouts of watery diarrhea.  Patient also reports that he is still able to pass flatus.  Patient denies any fever, recent ingestion of undercooked food, sick contacts, recent antibiotic use or recent travel.  Due to persisting symptoms patient eventually presented to [facility] emergency department for evaluation.  Upon evaluation in the emergency department CT imaging of the abdomen pelvis revealed evidence of partial small bowel obstruction with transition point appearing to be in the lower abdomen.  EDP discussed case with Dr. Dema Severin with general surgery who agreed to see patient in consultation and saw patient at the bedside in emergency department.  Patient had an NG tube placed and set to low intermittent  suction.  The hospitalist group is now been called to assess the patient for admission to the hospital.  Review of Systems: Review of Systems  Gastrointestinal:  Positive for abdominal pain, diarrhea, nausea and vomiting.  All other systems reviewed and are negative.    Past Medical History:  Diagnosis Date   Allergy    Arthritis    Asthma    BPH (benign prostatic hyperplasia)    Chronic back pain    "all over"   COPD (chronic obstructive pulmonary disease) (HCC)    GERD (gastroesophageal reflux disease)    Hx of adenomatous colonic polyps 03/22/2016   Hypertension    Influenza 11/20/2011   Sepsis ([place]) 02/05/2016    Past Surgical History:  Procedure Laterality Date   ABDOMINAL HERNIA REPAIR     ANTERIOR CERVICAL DECOMP/DISCECTOMY FUSION     C6-8   APPENDECTOMY  1960s   BACK SURGERY     COLONOSCOPY     HERNIA REPAIR     INSERTION DISTRACTION DEVICE LUMBAR POSTERIOR SPINOUS PROCESS     KNEE ARTHROSCOPY Left    SHOULDER SURGERY Left 02/11/2017   SMALL BOWEL REPAIR      Social History:  reports that he has quit smoking. His smoking use included cigarettes. He has a 2.40 pack-year smoking history. He has quit using smokeless tobacco. He reports that he does not drink alcohol and does not use drugs.  Allergies  Allergen Reactions   Codeine Shortness Of Breath   Percocet [Oxycodone-Acetaminophen] Anaphylaxis   Angiotensin Receptor Blockers Other (See Comments)    Dyspnea   Crestor [Rosuvastatin]     Other reaction(s): Feet Hurt   Lyrica [Pregabalin] Other (See Comments)  Headache   Mevacor [Lovastatin]     Joint Pain   Pravachol [Pravastatin] Other (See Comments)   Talwin [Pentazocine] Nausea Only and Other (See Comments)    Stopped breathing   Zestril [Lisinopril] Other (See Comments)    Dyspnea   Zetia [Ezetimibe] Itching   Advair Diskus [Fluticasone-Salmeterol] Rash    unknown    Family History  Problem Relation Age of Onset   Coronary artery disease  Father    Pancreatic cancer Brother    Stroke Brother    Heart disease Brother    Dementia Mother    Diabetes type II Other    Colon cancer Neg Hx    Colon polyps Neg Hx    Rectal cancer Neg Hx    Stomach cancer Neg Hx     Prior to Admission medications   Medication Sig Start Date End Date Taking? Authorizing Provider  acetaminophen (TYLENOL) 500 MG tablet Take 1,000 mg by mouth every 6 (six) hours as needed.    [provider]  albuterol (VENTOLIN HFA) 108 (90 Base) MCG/ACT inhaler Inhale 1-2 puffs into the lungs every 4 (four) hours as needed. 05/03/21     aspirin EC 81 MG tablet Take 81 mg by mouth daily. Swallow whole.    [provider]  azelastine (ASTELIN) 0.1 % nasal spray Place 2 sprays into both nostrils 2 (two) times daily as needed for congestion. 01/06/20   [provider]  budesonide-formoterol (SYMBICORT) 80-4.5 MCG/ACT inhaler Inhale 2 puffs into the lungs 2 (two) times daily. 01/22/22     diclofenac (VOLTAREN) 75 MG EC tablet Take 1 tablet (75 mg total) by mouth 2 (two) times daily as needed. Do not take until finished with the steroid taper.  Do not take with any other antiinflammatories 08/13/22   Aundra Dubin, PA-C  loratadine (CLARITIN) 10 MG tablet Take 10 mg by mouth daily as needed for allergies.     [provider]  methocarbamol (ROBAXIN-750) 750 MG tablet Take 1 tablet (750 mg total) by mouth every 8 (eight) hours as needed for muscle spasms. 08/13/22   Aundra Dubin, PA-C  Multiple Vitamins-Minerals (ZINC PO) Take 1 tablet by mouth daily. CD ZINC    [provider]  nitroGLYCERIN (NITROSTAT) 0.4 MG SL tablet Place 1 tablet (0.4 mg total) under the tongue as needed. 07/16/21   Fay Records, MD  pantoprazole (PROTONIX) 40 MG tablet Take 1 tablet (40 mg total) by mouth daily for acid reflux 01/22/22     predniSONE (STERAPRED UNI-PAK 21 TAB) 10 MG (21) TBPK tablet Take as directed 08/13/22   Aundra Dubin, PA-C   rosuvastatin (CRESTOR) 5 MG tablet Take 1 tablet (5 mg total) by mouth daily. 11/06/21   Fay Records, MD  tamsulosin (FLOMAX) 0.4 MG CAPS capsule Take 1 capsule (0.4 mg total) by mouth 30 minutes after the same meal each day for urinary symtomps 01/22/22     telmisartan (MICARDIS) 20 MG tablet Take 1 tablet (20 mg total) by mouth daily for blood pressure 01/22/22     Zinc 100 MG TABS daily.    [provider]    Physical Exam:  Vitals:   08/23/22 2120 08/23/22 2159 08/24/22 0047 08/24/22 0458  BP:  (!) 91/55 100/64 99/66  Pulse:  68 73 70  Resp:  '17 17 16  '$ Temp: 98.1 F (36.7 C) 98.4 F (36.9 C) 97.9 F (36.6 C) 98 F (36.7 C)  TempSrc: Oral Oral Oral Oral  SpO2:  94% 95% 95%  Weight:      Height:        Constitutional: Awake alert and oriented x3, patient is in distress due to abdominal pain. Skin: no rashes, no lesions, good skin turgor noted. Eyes: Pupils are equally reactive to light.  No evidence of scleral icterus or conjunctival pallor.  ENMT: NG tube currently in place.  Slightly dry mucous membranes noted.  Posterior pharynx clear of any exudate or lesions.   Neck: normal, supple, no masses, no thyromegaly.  No evidence of jugular venous distension.   Respiratory: clear to auscultation bilaterally, no wheezing, no crackles. Normal respiratory effort. No accessory muscle use.  Cardiovascular: Regular rate and rhythm, no murmurs / rubs / gallops. No extremity edema. 2+ pedal pulses. No carotid bruits.  Chest:   Nontender without crepitus or deformity.   Back:   Nontender without crepitus or deformity. Abdomen: Left-sided abdominal tenderness.  Abdomen soft.  No evidence of intra-abdominal masses.  Hypoactive bowel sounds Musculoskeletal: No joint deformity upper and lower extremities. Good ROM, no contractures. Normal muscle tone.  Neurologic: CN 2-12 grossly intact. Sensation intact.  Patient moving all 4 extremities spontaneously.  Patient is following all  commands.  Patient is responsive to verbal stimuli.   Psychiatric: Patient exhibits normal mood with appropriate affect.  Patient seems to possess insight as to their current situation.    Data Reviewed:  I have personally reviewed and interpreted labs, imaging.  Significant findings are   CT Abd/Pelvis:  Evidence of partial small bowel obstruction. Transition point appears to be in lower abdomen in similar position to prior bowel obstruction.    Lab Results  Component Value Date   WBC 10.8 (H) 08/24/2022   HGB 13.5 08/24/2022   HCT 39.0 08/24/2022   MCV 94.7 08/24/2022   PLT 212 08/24/2022   Lab Results  Component Value Date   K 4.9 08/24/2022   Lab Results  Component Value Date   BUN 21 08/24/2022   Lab Results  Component Value Date   CREATININE 1.26 (H) 08/24/2022     Assessment and Plan: * Partial small bowel obstruction (HCC) Patient presenting with 1 day history of abdominal pain nausea and vomiting CT imaging revealing consistent with partial small bowel obstruction.   ER providers already discussed case with Dr. Dema Severin with general surgery who has come to see the patient in consultation at the bedside, his input is appreciated.   Patient currently n.p.o. Status post NG tube placement set to low intermittent suction Hydrating patient with intravenous isotonic fluids As needed antiemetics As needed opiate-based analgesics for associated pain    COPD (chronic obstructive pulmonary disease) (HCC) No evidence of COPD exacerbation this time Continue home regimen of maintenance inhalers As needed bronchodilator therapy for episodic shortness of breath and wheezing.   Essential hypertension Holding home regimen of oral antihypertensives in the setting of n.p.o. status As needed intravenous hydralazine for bouts of excessive hypertension  Chronic diastolic CHF (congestive heart failure) (HCC) No clinical evidence of cardiogenic volume overload We will  monitor closely to ensure patient remains euvolemic in the setting of ongoing intravenous fluids while patient is n.p.o. Strict input and output monitoring Daily weights    Mixed hyperlipidemia Holding home regimen of statin therapy while patient is n.p.o.  BPH (benign prostatic hyperplasia) Holding home regimen of tamsulosin while patient is n.p.o.  GERD (gastroesophageal reflux disease) Provide patient with daily intravenous PPI       Code Status:  DNR code status decision has been confirmed with: patient Family Communication: Deferred   Consults: Dr. Dema Severin with general surgery  Severity of Illness:  The appropriate patient status for this patient is OBSERVATION. Observation status is judged to be reasonable and necessary in order to provide the required intensity of service to ensure the patient's safety. The patient's presenting symptoms, physical exam findings, and initial radiographic and laboratory data in the context of their medical condition is felt to place them at decreased risk for further clinical deterioration. Furthermore, it is anticipated that the patient will be medically stable for discharge from the hospital within 2 midnights of admission.   Author:  Vernelle Emerald MD  08/24/2022 7:44 AM

## 2022-08-23 NOTE — Assessment & Plan Note (Signed)
   Provide patient with daily intravenous PPI

## 2022-08-23 NOTE — Assessment & Plan Note (Signed)
.   No clinical evidence of cardiogenic volume overload . We will monitor closely to ensure patient remains euvolemic in the setting of ongoing intravenous fluids while patient is n.p.o. . Strict input and output monitoring . Daily weights

## 2022-08-23 NOTE — Assessment & Plan Note (Signed)
   Holding home regimen of statin therapy while patient is n.p.o.

## 2022-08-23 NOTE — Assessment & Plan Note (Signed)
   Holding home regimen of oral antihypertensives in the setting of n.p.o. status  As needed intravenous hydralazine for bouts of excessive hypertension

## 2022-08-23 NOTE — ED Provider Notes (Signed)
[facility] Provider Note   CSN: [id number] Arrival date & time: 08/23/22  1337     History {Add pertinent medical, surgical, social history, OB history to HPI:1} Chief Complaint  Patient presents with  . Abdominal Pain    SBO hx    Shane Clark is a 71 y.o. male with COPD, BPH, HTN, h/o small bowel stricture with multiple hernia repairs and recurrent SBO's, GERD, HLD, grade 1 diastolic dysfunction presents with abd pain.  Patient reports acute onset abd pain that began at 0230 this AM a/w severe N/V. Had BM this AM that was watery diarrhea. Is still passing gas. States this feels like his prior SBO but is worse this time.  Denies fevers chills, chest pain, palpitations but endorses shortness of breath with the pain as well as lightheadedness.  He reports that his bowel obstructions have previously resolved with medical management but the last time he did have to have surgical repair.  Per chart review, patient was admitted from 04/08/2022 to 04/11/2022 for small bowel obstruction that was treated medically with NG tube placement and bowel rest.   Abdominal Pain      Home Medications Prior to Admission medications   Medication Sig Start Date End Date Taking? Authorizing Provider  acetaminophen (TYLENOL) 500 MG tablet Take 1,000 mg by mouth every 6 (six) hours as needed.    [provider]  albuterol (VENTOLIN HFA) 108 (90 Base) MCG/ACT inhaler Inhale 1-2 puffs into the lungs every 4 (four) hours as needed. 05/03/21     aspirin EC 81 MG tablet Take 81 mg by mouth daily. Swallow whole.    [provider]  azelastine (ASTELIN) 0.1 % nasal spray Place 2 sprays into both nostrils 2 (two) times daily as needed for congestion. 01/06/20   [provider]  budesonide-formoterol (SYMBICORT) 80-4.5 MCG/ACT inhaler Inhale 2 puffs into the lungs 2 (two) times daily. 01/22/22     diclofenac (VOLTAREN) 75 MG EC tablet Take 1 tablet  (75 mg total) by mouth 2 (two) times daily as needed. Do not take until finished with the steroid taper.  Do not take with any other antiinflammatories 08/13/22   Aundra Dubin, PA-C  loratadine (CLARITIN) 10 MG tablet Take 10 mg by mouth daily as needed for allergies.     [provider]  methocarbamol (ROBAXIN-750) 750 MG tablet Take 1 tablet (750 mg total) by mouth every 8 (eight) hours as needed for muscle spasms. 08/13/22   Aundra Dubin, PA-C  Multiple Vitamins-Minerals (ZINC PO) Take 1 tablet by mouth daily. CD ZINC    [provider]  nitroGLYCERIN (NITROSTAT) 0.4 MG SL tablet Place 1 tablet (0.4 mg total) under the tongue as needed. 07/16/21   Fay Records, MD  pantoprazole (PROTONIX) 40 MG tablet Take 1 tablet (40 mg total) by mouth daily for acid reflux 01/22/22     predniSONE (STERAPRED UNI-PAK 21 TAB) 10 MG (21) TBPK tablet Take as directed 08/13/22   Aundra Dubin, PA-C  rosuvastatin (CRESTOR) 5 MG tablet Take 1 tablet (5 mg total) by mouth daily. 11/06/21   Fay Records, MD  tamsulosin (FLOMAX) 0.4 MG CAPS capsule Take 1 capsule (0.4 mg total) by mouth 30 minutes after the same meal each day for urinary symtomps 01/22/22     telmisartan (MICARDIS) 20 MG tablet Take 1 tablet (20 mg total) by mouth daily for blood pressure 01/22/22     Zinc 100 MG  TABS daily.    [provider]      Allergies    Codeine, Percocet [oxycodone-acetaminophen], Angiotensin receptor blockers, Crestor [rosuvastatin], Lyrica [pregabalin], Mevacor [lovastatin], Pravachol [pravastatin], Talwin [pentazocine], Zestril [lisinopril], Zetia [ezetimibe], and Advair diskus [fluticasone-salmeterol]    Review of Systems   Review of Systems  Gastrointestinal:  Positive for abdominal pain.   Review of systems negative for recent illnesses.  A 10 point review of systems was performed and is negative unless otherwise reported in HPI.  Physical Exam Updated Vital Signs BP 103/75 (BP  Location: Right Arm)   Pulse 96   Temp 97.7 F (36.5 C) (Oral)   Resp 17   Ht '5\' 8"'$  (1.727 m)   Wt 117 kg   SpO2 96%   BMI 39.23 kg/m  Physical Exam General: Extremely uncomfortable-appearing male, lying in bed.  HEENT: PERRLA, Sclera anicteric, MMM, trachea midline. Cardiology: RRR, no murmurs/rubs/gallops. BL radial and DP pulses equal bilaterally.  Resp: Normal respiratory rate and effort. CTAB, no wheezes, rhonchi, crackles.  Abd: Obese. Diffusely tender to palpation, slightly distended. No rebound tenderness or guarding.  GU: Deferred. MSK: No peripheral edema or signs of trauma. Extremities without deformity. No cyanosis or clubbing. Skin: warm, dry. No rashes or lesions. Neuro: A&Ox4, CNs II-XII grossly intact. MAEs. Sensation grossly intact.  Psych: Normal mood and affect.   ED Results / Procedures / Treatments   Labs (all labs ordered are listed, but only abnormal results are displayed) Labs Reviewed  COMPREHENSIVE METABOLIC PANEL  LIPASE, BLOOD  CBC WITH DIFFERENTIAL/PLATELET  URINALYSIS, ROUTINE W REFLEX MICROSCOPIC  LACTIC ACID, PLASMA  PROTIME-INR    EKG None  Radiology No results found.  Procedures Procedures  {Document cardiac monitor, telemetry assessment procedure when appropriate:1}  Medications Ordered in ED Medications  ondansetron (ZOFRAN) injection 4 mg (4 mg Intravenous Given 08/23/22 1425)  HYDROmorphone (DILAUDID) injection 0.5 mg (0.5 mg Intravenous Given 08/23/22 1425)    ED Course/ Medical Decision Making/ A&P                          Medical Decision Making Amount and/or Complexity of Data Reviewed Labs: ordered. Decision-making details documented in ED Course. Radiology: ordered.  Risk Prescription drug management.    '@HNNMDM'$ @ ***  I have personally reviewed and interpreted all labs and imaging.   Clinical Course as of 08/23/22 1906  Sat Aug 23, 2022  1501 BP(!): 88/65 Initiated fluid bolus [HN]  1501 WBC(!): 12.8  [HN]  1531 Lipase: 32 [HN]  1545 Lactic Acid, Venous(!!): 2.4 Receiving fluid bolus [HN]  1639 BP: 111/70 [HN]  1906 CT ABDOMEN PELVIS W CONTRAST Evidence of partial small bowel obstruction. Transition point appears to be in lower abdomen in similar position to prior bowel obstruction.  Aortic atherosclerosis. [HN]    Clinical Course User Index [HN] Audley Hose, MD    {Document critical care time when appropriate:1} {Document review of labs and clinical decision tools ie heart score, Chads2Vasc2 etc:1}  {Document your independent review of radiology images, and any outside records:1} {Document your discussion with family members, caretakers, and with consultants:1} {Document social determinants of health affecting pt's care:1} {Document your decision making why or why not admission, treatments were needed:1} Final Clinical Impression(s) / ED Diagnoses Final diagnoses:  None    Rx / DC Orders ED Discharge Orders     None        This note was created using dictation software, which may  contain spelling or grammatical errors.

## 2022-08-23 NOTE — ED Triage Notes (Signed)
Pt arrives to ED via POV w/ hx of SBO and is having pain similar to previous obstructions. Onset 0230. Dry heaves, Diarrhea, LUQ pain. Pt visibly in pain.

## 2022-08-23 NOTE — Assessment & Plan Note (Signed)
   Holding home regimen of tamsulosin while patient is n.p.o.

## 2022-08-23 NOTE — Assessment & Plan Note (Signed)
   No evidence of COPD exacerbation this time  Continue home regimen of maintenance inhalers  As needed bronchodilator therapy for episodic shortness of breath and wheezing.  

## 2022-08-24 ENCOUNTER — Inpatient Hospital Stay (HOSPITAL_COMMUNITY): Payer: Medicare Other

## 2022-08-24 ENCOUNTER — Observation Stay (HOSPITAL_COMMUNITY): Payer: Medicare Other

## 2022-08-24 DIAGNOSIS — E782 Mixed hyperlipidemia: Secondary | ICD-10-CM | POA: Diagnosis present

## 2022-08-24 DIAGNOSIS — Z823 Family history of stroke: Secondary | ICD-10-CM | POA: Diagnosis not present

## 2022-08-24 DIAGNOSIS — K56600 Partial intestinal obstruction, unspecified as to cause: Secondary | ICD-10-CM | POA: Diagnosis present

## 2022-08-24 DIAGNOSIS — I5032 Chronic diastolic (congestive) heart failure: Secondary | ICD-10-CM | POA: Diagnosis present

## 2022-08-24 DIAGNOSIS — Z87891 Personal history of nicotine dependence: Secondary | ICD-10-CM | POA: Diagnosis not present

## 2022-08-24 DIAGNOSIS — R112 Nausea with vomiting, unspecified: Secondary | ICD-10-CM | POA: Diagnosis present

## 2022-08-24 DIAGNOSIS — Z9049 Acquired absence of other specified parts of digestive tract: Secondary | ICD-10-CM | POA: Diagnosis not present

## 2022-08-24 DIAGNOSIS — Z7951 Long term (current) use of inhaled steroids: Secondary | ICD-10-CM | POA: Diagnosis not present

## 2022-08-24 DIAGNOSIS — I110 Hypertensive heart disease with heart failure: Secondary | ICD-10-CM | POA: Diagnosis present

## 2022-08-24 DIAGNOSIS — Z833 Family history of diabetes mellitus: Secondary | ICD-10-CM | POA: Diagnosis not present

## 2022-08-24 DIAGNOSIS — Z79899 Other long term (current) drug therapy: Secondary | ICD-10-CM | POA: Diagnosis not present

## 2022-08-24 DIAGNOSIS — K56609 Unspecified intestinal obstruction, unspecified as to partial versus complete obstruction: Secondary | ICD-10-CM | POA: Diagnosis not present

## 2022-08-24 DIAGNOSIS — Z8249 Family history of ischemic heart disease and other diseases of the circulatory system: Secondary | ICD-10-CM | POA: Diagnosis not present

## 2022-08-24 DIAGNOSIS — Z7982 Long term (current) use of aspirin: Secondary | ICD-10-CM | POA: Diagnosis not present

## 2022-08-24 DIAGNOSIS — Z800 Family history of malignant neoplasm of digestive organs: Secondary | ICD-10-CM | POA: Diagnosis not present

## 2022-08-24 DIAGNOSIS — K219 Gastro-esophageal reflux disease without esophagitis: Secondary | ICD-10-CM | POA: Diagnosis present

## 2022-08-24 DIAGNOSIS — J449 Chronic obstructive pulmonary disease, unspecified: Secondary | ICD-10-CM | POA: Diagnosis present

## 2022-08-24 DIAGNOSIS — N400 Enlarged prostate without lower urinary tract symptoms: Secondary | ICD-10-CM | POA: Diagnosis present

## 2022-08-24 DIAGNOSIS — Z66 Do not resuscitate: Secondary | ICD-10-CM | POA: Diagnosis present

## 2022-08-24 LAB — CBC WITH DIFFERENTIAL/PLATELET
Abs Immature Granulocytes: 0.05 10*3/uL (ref 0.00–0.07)
Basophils Absolute: 0 10*3/uL (ref 0.0–0.1)
Basophils Relative: 0 %
Eosinophils Absolute: 0 10*3/uL (ref 0.0–0.5)
Eosinophils Relative: 0 %
HCT: 39 % (ref 39.0–52.0)
Hemoglobin: 13.5 g/dL (ref 13.0–17.0)
Immature Granulocytes: 1 %
Lymphocytes Relative: 10 %
Lymphs Abs: 1.1 10*3/uL (ref 0.7–4.0)
MCH: 32.8 pg (ref 26.0–34.0)
MCHC: 34.6 g/dL (ref 30.0–36.0)
MCV: 94.7 fL (ref 80.0–100.0)
Monocytes Absolute: 1 10*3/uL (ref 0.1–1.0)
Monocytes Relative: 9 %
Neutro Abs: 8.7 10*3/uL — ABNORMAL HIGH (ref 1.7–7.7)
Neutrophils Relative %: 80 %
Platelets: 212 10*3/uL (ref 150–400)
RBC: 4.12 MIL/uL — ABNORMAL LOW (ref 4.22–5.81)
RDW: 13.6 % (ref 11.5–15.5)
WBC: 10.8 10*3/uL — ABNORMAL HIGH (ref 4.0–10.5)
nRBC: 0 % (ref 0.0–0.2)

## 2022-08-24 LAB — COMPREHENSIVE METABOLIC PANEL
ALT: 32 U/L (ref 0–44)
AST: 25 U/L (ref 15–41)
Albumin: 3.4 g/dL — ABNORMAL LOW (ref 3.5–5.0)
Alkaline Phosphatase: 58 U/L (ref 38–126)
Anion gap: 9 (ref 5–15)
BUN: 21 mg/dL (ref 8–23)
CO2: 24 mmol/L (ref 22–32)
Calcium: 8.5 mg/dL — ABNORMAL LOW (ref 8.9–10.3)
Chloride: 103 mmol/L (ref 98–111)
Creatinine, Ser: 1.26 mg/dL — ABNORMAL HIGH (ref 0.61–1.24)
GFR, Estimated: >60 mL/min (ref >60)
Glucose, Bld: 94 mg/dL (ref 70–99)
Potassium: 4.9 mmol/L (ref 3.5–5.1)
Sodium: 136 mmol/L (ref 135–145)
Total Bilirubin: 1.2 mg/dL (ref 0.3–1.2)
Total Protein: 6.4 g/dL — ABNORMAL LOW (ref 6.5–8.1)

## 2022-08-24 LAB — MAGNESIUM: Magnesium: 1.8 mg/dL (ref 1.7–2.4)

## 2022-08-24 MED ORDER — TAMSULOSIN HCL 0.4 MG PO CAPS
0.4000 mg | ORAL_CAPSULE | Freq: Every day | ORAL | Status: DC
  Administered 2022-08-25 – 2022-08-26 (×2): 0.4 mg via ORAL
  Filled 2022-08-24 (×2): qty 1

## 2022-08-24 MED ORDER — PHENOL 1.4 % MT LIQD
1.0000 | OROMUCOSAL | Status: DC | PRN
  Filled 2022-08-24: qty 177

## 2022-08-24 MED ORDER — ORAL CARE MOUTH RINSE: 15.0000 mL | OROMUCOSAL | Status: DC | PRN

## 2022-08-24 NOTE — Progress Notes (Signed)
Subjective: CC: Abdominal pain improved. Still some on left side. Nausea resolved. Discomfort from ngt. NGT w/ 150cc output. Reports he is passing flatus. No bm since admit, last bm right before presentation.   Objective: Vital signs in last 24 hours: Temp:  [97.7 F (36.5 C)-98.4 F (36.9 C)] 98 F (36.7 C) (09/24 0458) Pulse Rate:  [68-96] 70 (09/24 0458) Resp:  [13-29] 16 (09/24 0458) BP: (84-128)/(35-87) 99/66 (09/24 0458) SpO2:  [93 %-99 %] 95 % (09/24 0458) Weight:  [409 kg] 117 kg (09/23 1352) Last BM Date : 08/23/22  Intake/Output from previous day: 09/23 0701 - 09/24 0700 In: 736.1 [I.V.:736.1] Out: 550 [Urine:400; Emesis/NG output:150] Intake/Output this shift: No intake/output data recorded.  PE: Gen:  Alert, NAD, pleasant Abd: Obese with at least mild distension but very soft, mild ttp over the left abdomen - no rigdity or guarding - otherwise nt. +BS. NGT in place.   Lab Results:  Recent Labs    08/23/22 1437 08/24/22 0100  WBC 12.8* 10.8*  HGB 14.8 13.5  HCT 43.8 39.0  PLT 232 212   BMET Recent Labs    08/23/22 1437 08/24/22 0100  NA 136 136  K 4.8 4.9  CL 103 103  CO2 22 24  GLUCOSE 106* 94  BUN 24* 21  CREATININE 1.23 1.26*  CALCIUM 9.0 8.5*   PT/INR Recent Labs    08/23/22 1437  LABPROT 13.5  INR 1.0   CMP     Component Value Date/Time   NA 136 08/24/2022 0100   NA 137 08/06/2021 1114   K 4.9 08/24/2022 0100   CL 103 08/24/2022 0100   CO2 24 08/24/2022 0100   GLUCOSE 94 08/24/2022 0100   BUN 21 08/24/2022 0100   BUN 20 08/06/2021 1114   CREATININE 1.26 (H) 08/24/2022 0100   CALCIUM 8.5 (L) 08/24/2022 0100   PROT 6.4 (L) 08/24/2022 0100   ALBUMIN 3.4 (L) 08/24/2022 0100   AST 25 08/24/2022 0100   ALT 32 08/24/2022 0100   ALKPHOS 58 08/24/2022 0100   BILITOT 1.2 08/24/2022 0100   GFRNONAA >60 08/24/2022 0100   GFRAA >60 04/19/2020 0347   Lipase     Component Value Date/Time   LIPASE 32 08/23/2022 1437     Studies/Results: DG Abd Portable 1V-Small Bowel Obstruction Protocol-initial, 8 hr delay  Result Date: 08/24/2022 CLINICAL DATA:  71 year old male with history of small-bowel obstruction. EXAM: PORTABLE ABDOMEN - 1 VIEW COMPARISON:  Abdominal radiograph 08/23/2022. FINDINGS: Nasogastric tube extends into the proximal stomach. No pathologic dilatation of small bowel or colon. Oral contrast material, gas and stool is noted throughout the colon and rectum. No pneumoperitoneum. IMPRESSION: 1. Nonobstructive bowel gas pattern. 2. No pneumoperitoneum. Electronically Signed   By: Vinnie Langton M.D.   On: 08/24/2022 07:19   DG Abd Portable 1V-Small Bowel Protocol-Position Verification  Result Date: 08/23/2022 CLINICAL DATA:  NG tube placement EXAM: PORTABLE ABDOMEN - 1 VIEW COMPARISON:  CT abdomen and pelvis earlier today FINDINGS: Interval placement of enteric tube with tip and side port projecting over the stomach. Partially visualized prominent loops of small bowel compatible with obstruction as seen on CT earlier today. IMPRESSION: Enteric tube tip and side-port in the stomach. Electronically Signed   By: Placido Sou M.D.   On: 08/23/2022 21:22   CT ABDOMEN PELVIS W CONTRAST  Result Date: 08/23/2022 CLINICAL DATA:  Bowel obstruction suspected.  Abdominal pain EXAM: CT ABDOMEN AND PELVIS WITH CONTRAST TECHNIQUE: Multidetector  CT imaging of the abdomen and pelvis was performed using the standard protocol following bolus administration of intravenous contrast. RADIATION DOSE REDUCTION: This exam was performed according to the departmental dose-optimization program which includes automated exposure control, adjustment of the mA and/or kV according to patient size and/or use of iterative reconstruction technique. CONTRAST:  77m OMNIPAQUE IOHEXOL 350 MG/ML SOLN COMPARISON:  04/08/2022 FINDINGS: Lower chest: No acute findings Hepatobiliary: No focal hepatic abnormality. Gallbladder unremarkable.  Pancreas: No focal abnormality or ductal dilatation. Spleen: No focal abnormality.  Normal size. Adrenals/Urinary Tract: No adrenal abnormality. No focal renal abnormality. No stones or hydronephrosis. Urinary bladder is unremarkable. Stomach/Bowel: Stomach and proximal to mid small bowel dilated and fluid-filled compatible with small bowel obstruction. Transition point seems to be in the anterior lower abdomen in a similar location to prior obstruction. Distal small bowel decompressed as is the colon, grossly unremarkable. Vascular/Lymphatic: Aortic atherosclerosis. No evidence of aneurysm or adenopathy. Reproductive: Prostate enlargement. Other: No free fluid or free air. Musculoskeletal: No acute bony abnormality. Degenerative changes in the lumbar spine. IMPRESSION: Evidence of partial small bowel obstruction. Transition point appears to be in lower abdomen in similar position to prior bowel obstruction. Aortic atherosclerosis. Electronically Signed   By: KRolm BaptiseM.D.   On: 08/23/2022 18:18    Anti-infectives: Anti-infectives (From admission, onward)    None        Assessment/Plan pSBO - CT A/P with psbo. Hx appendectomy at age 71 ex lap, umbilical hernia repair in 2003 by Dr. BNinfa Linden and ex lap, loa in 2012 by Dr. WDonne Hazel Hx prior SBO's in 2018, 2019 and 2021, 2023 that resolved w/ medical management  - Xray with contrast in colon. Low ngt output. Symptoms improved. Passing some flatus. NGT clamping trial.  - Hopefully patient will improve with conservative management. If patient fails to improve with conservative management, they may require exploratory surgery during admission - Agree with medical admission. We will follow with you.   FEN - Clamp NGT. IVF per [facility] VTE - SCDs, okay for chemical prophylaxis from a general surgery standpoint ID - None  Foley - None   HTN COPD GERD   LOS: 0 days    MJillyn Ledger, PR[facility] 08/24/2022, 9:03  AM Please see Amion for pager number during day hours 7:00am-4:30pm

## 2022-08-24 NOTE — Progress Notes (Signed)
Patient arrived on unit from ED on stretcher. Alert and oriented, no acute distress noted. Patient with NG tube in place, daughter at bedside. Patient settled into room, admission assessment completed. Will continue to monitor according to orders and plan of care.

## 2022-08-24 NOTE — Progress Notes (Signed)
PROGRESS NOTE    Shane Clark  YTK:[id number] DOB: July 16, 1951 DOA: 08/23/2022 PCP: Gaynelle Arabian, MD    Brief Narrative:   71 year old male with past medical history of diastolic congestive heart failure, benign prostatic hyperplasia, COPD, gastroesophageal reflux's, hypertension, hyperlipidemia and pertinent history of multiple hernia repairs in the past with previous small bowel stricture and multiple small bowel obstructions in the past presented hospital with abdominal pain nausea and vomiting with abdominal distention. In ED, CT imaging of the abdomen pelvis revealed evidence of partial small bowel obstruction with transition point appearing to be in the lower abdomen.  NG-tube was placed in to intermittent suction in general surgery was consulted.  Patient was then admitted hospital for partial small bowel obstruction.   Assessment and Plan: Principal Problem:   Partial small bowel obstruction (HCC) Active Problems:   COPD (chronic obstructive pulmonary disease) (HCC)   Essential hypertension   Chronic diastolic CHF (congestive heart failure) (HCC)   Mixed hyperlipidemia   BPH (benign prostatic hyperplasia)   GERD (gastroesophageal reflux disease)   Partial small bowel obstruction (HCC) CT scan showing partial small bowel obstruction.  General surgery on board.  Received NG tube n.p.o. IV fluids antiemetics.  Conservative treatment at this time.  Follow surgical recommendations.  Plan for NGT clamping trial.  COPD (chronic obstructive pulmonary disease) (HCC) Not in exacerbation.  Continue bronchodilators from home.  Essential hypertension As needed IV hydralazine.  Home medications on hold.  Patient is on telmisartan at home.  Chronic diastolic CHF (congestive heart failure) (HCC) Continue daily intake and output charting, daily weights.  Mixed hyperlipidemia Continue statins.  BPH (benign prostatic hyperplasia) Continue tamsulosin.  GERD (gastroesophageal reflux  disease) Continue IV PPI    DVT prophylaxis: enoxaparin (LOVENOX) injection 40 mg Start: 08/24/22 1800   Code Status:     Code Status: DNR  Disposition: Home  Status is: Observation  The patient will require care spanning > 2 midnights and should be moved to inpatient because: Bowel obstruction, NG tube under suction, conservative treatment, n.p.o. status   Family Communication: None at bedside.  Consultants:  General surgery  Procedures:  NG tube placement  Antimicrobials:  None  Anti-infectives (From admission, onward)    None      Subjective: Today, patient was seen and examined at bedside.  Patient states that he feels little better today has been passing some flatus but no bowel movement no nausea vomiting.  Has NG tube in place.  Nausea has improved.  Objective: Vitals:   08/23/22 2120 08/23/22 2159 08/24/22 0047 08/24/22 0458  BP:  (!) 91/55 100/64 99/66  Pulse:  68 73 70  Resp:  '17 17 16  '$ Temp: 98.1 F (36.7 C) 98.4 F (36.9 C) 97.9 F (36.6 C) 98 F (36.7 C)  TempSrc: Oral Oral Oral Oral  SpO2:  94% 95% 95%  Weight:      Height:        Intake/Output Summary (Last 24 hours) at 08/24/2022 0906 Last data filed at 08/24/2022 0612 Gross per 24 hour  Intake 736.11 ml  Output 550 ml  Net 186.11 ml   Filed Weights   08/23/22 1352  Weight: 117 kg    Physical Examination: Body mass index is 39.23 kg/m.  General: Obese built, not in obvious distress HENT:   No scleral pallor or icterus noted. Oral mucosa is moist.  NG tube in place Chest:  Clear breath sounds.  Diminished breath sounds bilaterally. No crackles or wheezes.  CVS:  S1 &S2 heard. No murmur.  Regular rate and rhythm. Abdomen: Soft, nontender, mild distention and minimal tenderness on palpation, bowel sounds present  Extremities: No cyanosis, clubbing or edema.  Peripheral pulses are palpable. Psych: Alert, awake and oriented, normal mood CNS:  No cranial nerve deficits.  Power equal in  all extremities.   Skin: Warm and dry.  No rashes noted.  Data Reviewed:   CBC: Recent Labs  Lab 08/23/22 1437 08/24/22 0100  WBC 12.8* 10.8*  NEUTROABS 10.6* 8.7*  HGB 14.8 13.5  HCT 43.8 39.0  MCV 96.5 94.7  PLT 232 938    Basic Metabolic Panel: Recent Labs  Lab 08/23/22 1437 08/24/22 0100  NA 136 136  K 4.8 4.9  CL 103 103  CO2 22 24  GLUCOSE 106* 94  BUN 24* 21  CREATININE 1.23 1.26*  CALCIUM 9.0 8.5*  MG  --  1.8    Liver Function Tests: Recent Labs  Lab 08/23/22 1437 08/24/22 0100  AST 34 25  ALT 39 32  ALKPHOS 68 58  BILITOT 1.5* 1.2  PROT 7.2 6.4*  ALBUMIN 3.9 3.4*     Radiology Studies: DG Abd Portable 1V-Small Bowel Obstruction Protocol-initial, 8 hr delay  Result Date: 08/24/2022 CLINICAL DATA:  71 year old male with history of small-bowel obstruction. EXAM: PORTABLE ABDOMEN - 1 VIEW COMPARISON:  Abdominal radiograph 08/23/2022. FINDINGS: Nasogastric tube extends into the proximal stomach. No pathologic dilatation of small bowel or colon. Oral contrast material, gas and stool is noted throughout the colon and rectum. No pneumoperitoneum. IMPRESSION: 1. Nonobstructive bowel gas pattern. 2. No pneumoperitoneum. Electronically Signed   By: Vinnie Langton M.D.   On: 08/24/2022 07:19   DG Abd Portable 1V-Small Bowel Protocol-Position Verification  Result Date: 08/23/2022 CLINICAL DATA:  NG tube placement EXAM: PORTABLE ABDOMEN - 1 VIEW COMPARISON:  CT abdomen and pelvis earlier today FINDINGS: Interval placement of enteric tube with tip and side port projecting over the stomach. Partially visualized prominent loops of small bowel compatible with obstruction as seen on CT earlier today. IMPRESSION: Enteric tube tip and side-port in the stomach. Electronically Signed   By: Placido Sou M.D.   On: 08/23/2022 21:22   CT ABDOMEN PELVIS W CONTRAST  Result Date: 08/23/2022 CLINICAL DATA:  Bowel obstruction suspected.  Abdominal pain EXAM: CT ABDOMEN AND  PELVIS WITH CONTRAST TECHNIQUE: Multidetector CT imaging of the abdomen and pelvis was performed using the standard protocol following bolus administration of intravenous contrast. RADIATION DOSE REDUCTION: This exam was performed according to the departmental dose-optimization program which includes automated exposure control, adjustment of the mA and/or kV according to patient size and/or use of iterative reconstruction technique. CONTRAST:  24m OMNIPAQUE IOHEXOL 350 MG/ML SOLN COMPARISON:  04/08/2022 FINDINGS: Lower chest: No acute findings Hepatobiliary: No focal hepatic abnormality. Gallbladder unremarkable. Pancreas: No focal abnormality or ductal dilatation. Spleen: No focal abnormality.  Normal size. Adrenals/Urinary Tract: No adrenal abnormality. No focal renal abnormality. No stones or hydronephrosis. Urinary bladder is unremarkable. Stomach/Bowel: Stomach and proximal to mid small bowel dilated and fluid-filled compatible with small bowel obstruction. Transition point seems to be in the anterior lower abdomen in a similar location to prior obstruction. Distal small bowel decompressed as is the colon, grossly unremarkable. Vascular/Lymphatic: Aortic atherosclerosis. No evidence of aneurysm or adenopathy. Reproductive: Prostate enlargement. Other: No free fluid or free air. Musculoskeletal: No acute bony abnormality. Degenerative changes in the lumbar spine. IMPRESSION: Evidence of partial small bowel obstruction. Transition point appears to be in lower  abdomen in similar position to prior bowel obstruction. Aortic atherosclerosis. Electronically Signed   By: Rolm Baptise M.D.   On: 08/23/2022 18:18      LOS: 0 days    Flora Lipps, MD [facility] Available via Epic secure chat 7am-7pm After these hours, please refer to coverage provider listed on [email or web address] 08/24/2022, 9:06 AM

## 2022-08-25 LAB — COMPREHENSIVE METABOLIC PANEL
ALT: 29 U/L (ref 0–44)
AST: 24 U/L (ref 15–41)
Albumin: 3.3 g/dL — ABNORMAL LOW (ref 3.5–5.0)
Alkaline Phosphatase: 50 U/L (ref 38–126)
Anion gap: 8 (ref 5–15)
BUN: 16 mg/dL (ref 8–23)
CO2: 25 mmol/L (ref 22–32)
Calcium: 8.8 mg/dL — ABNORMAL LOW (ref 8.9–10.3)
Chloride: 104 mmol/L (ref 98–111)
Creatinine, Ser: 1.21 mg/dL (ref 0.61–1.24)
GFR, Estimated: >60 mL/min (ref >60)
Glucose, Bld: 74 mg/dL (ref 70–99)
Potassium: 4.5 mmol/L (ref 3.5–5.1)
Sodium: 137 mmol/L (ref 135–145)
Total Bilirubin: 1.2 mg/dL (ref 0.3–1.2)
Total Protein: 6.3 g/dL — ABNORMAL LOW (ref 6.5–8.1)

## 2022-08-25 LAB — CBC
HCT: 37 % — ABNORMAL LOW (ref 39.0–52.0)
Hemoglobin: 12.8 g/dL — ABNORMAL LOW (ref 13.0–17.0)
MCH: 33.4 pg (ref 26.0–34.0)
MCHC: 34.6 g/dL (ref 30.0–36.0)
MCV: 96.6 fL (ref 80.0–100.0)
Platelets: 183 10*3/uL (ref 150–400)
RBC: 3.83 MIL/uL — ABNORMAL LOW (ref 4.22–5.81)
RDW: 13.6 % (ref 11.5–15.5)
WBC: 10.3 10*3/uL (ref 4.0–10.5)
nRBC: 0 % (ref 0.0–0.2)

## 2022-08-25 LAB — MAGNESIUM: Magnesium: 1.9 mg/dL (ref 1.7–2.4)

## 2022-08-25 NOTE — TOC CM/SW Note (Signed)
  Transition of Care [facility]) Screening Note   Patient Details  Name: Shane Clark Date of Birth: 20-Dec-1950    Transition of Care Department [facility]) has reviewed patient and no TOC needs have been identified at this time. We will continue to monitor patient advancement through interdisciplinary progression rounds. If new patient transition needs arise, please place a TOC consult.

## 2022-08-25 NOTE — Progress Notes (Signed)
Mobility Specialist - Progress Note   08/25/22 0925  Mobility  Activity Ambulated independently in hallway  Level of Assistance Modified independent, requires aide device or extra time  Assistive Device Other (Comment) (IV Pole)  Distance Ambulated (ft) 1100 ft  Activity Response Tolerated well    Pt ambulated independently in hallway. No physical assistance required. Left in room w/ call bell in reach and all needs met.   Paulla Dolly Mobility Specialist

## 2022-08-25 NOTE — Progress Notes (Signed)
Subjective: CC: Doing well. Tolerated ngt clamped and cld yesterday. Pulled ngt out overnight. No abdominal pain, n/v. Passing flatus. 4 large liquid bms yesterday.   Objective: Vital signs in last 24 hours: Temp:  [97.7 F (36.5 C)-98.6 F (37 C)] 98.4 F (36.9 C) (09/25 0745) Pulse Rate:  [63-86] 65 (09/25 0745) Resp:  [15-18] 16 (09/25 0745) BP: (107-122)/(58-72) 122/66 (09/25 0745) SpO2:  [92 %-97 %] 94 % (09/25 0745) Last BM Date : 08/24/22  Intake/Output from previous day: 09/24 0701 - 09/25 0700 In: 1442 [I.V.:1442] Out: 400 [Urine:400] Intake/Output this shift: No intake/output data recorded.  PE: Gen:  Alert, NAD, pleasant Abd: Obese, less distended, very soft, NT. +BS.  Lab Results:  Recent Labs    08/24/22 0100 08/25/22 0028  WBC 10.8* 10.3  HGB 13.5 12.8*  HCT 39.0 37.0*  PLT 212 183   BMET Recent Labs    08/24/22 0100 08/25/22 0028  NA 136 137  K 4.9 4.5  CL 103 104  CO2 24 25  GLUCOSE 94 74  BUN 21 16  CREATININE 1.26* 1.21  CALCIUM 8.5* 8.8*   PT/INR Recent Labs    08/23/22 1437  LABPROT 13.5  INR 1.0   CMP     Component Value Date/Time   NA 137 08/25/2022 0028   NA 137 08/06/2021 1114   K 4.5 08/25/2022 0028   CL 104 08/25/2022 0028   CO2 25 08/25/2022 0028   GLUCOSE 74 08/25/2022 0028   BUN 16 08/25/2022 0028   BUN 20 08/06/2021 1114   CREATININE 1.21 08/25/2022 0028   CALCIUM 8.8 (L) 08/25/2022 0028   PROT 6.3 (L) 08/25/2022 0028   ALBUMIN 3.3 (L) 08/25/2022 0028   AST 24 08/25/2022 0028   ALT 29 08/25/2022 0028   ALKPHOS 50 08/25/2022 0028   BILITOT 1.2 08/25/2022 0028   GFRNONAA >60 08/25/2022 0028   GFRAA >60 04/19/2020 0347   Lipase     Component Value Date/Time   LIPASE 32 08/23/2022 1437    Studies/Results: DG Abd 1 View  Result Date: 08/24/2022 CLINICAL DATA:  NG tube placement. EXAM: ABDOMEN - 1 VIEW COMPARISON:  08/24/2022 FINDINGS: Enteric tube is again noted coiled overlying the proximal  stomach. No significant changes are noted. IMPRESSION: Enteric tube coiled overlying the proximal stomach. Electronically Signed   By: Margarette Canada M.D.   On: 08/24/2022 13:39   DG Abd Portable 1V-Small Bowel Obstruction Protocol-initial, 8 hr delay  Result Date: 08/24/2022 CLINICAL DATA:  71 year old male with history of small-bowel obstruction. EXAM: PORTABLE ABDOMEN - 1 VIEW COMPARISON:  Abdominal radiograph 08/23/2022. FINDINGS: Nasogastric tube extends into the proximal stomach. No pathologic dilatation of small bowel or colon. Oral contrast material, gas and stool is noted throughout the colon and rectum. No pneumoperitoneum. IMPRESSION: 1. Nonobstructive bowel gas pattern. 2. No pneumoperitoneum. Electronically Signed   By: Vinnie Langton M.D.   On: 08/24/2022 07:19   DG Abd Portable 1V-Small Bowel Protocol-Position Verification  Result Date: 08/23/2022 CLINICAL DATA:  NG tube placement EXAM: PORTABLE ABDOMEN - 1 VIEW COMPARISON:  CT abdomen and pelvis earlier today FINDINGS: Interval placement of enteric tube with tip and side port projecting over the stomach. Partially visualized prominent loops of small bowel compatible with obstruction as seen on CT earlier today. IMPRESSION: Enteric tube tip and side-port in the stomach. Electronically Signed   By: Placido Sou M.D.   On: 08/23/2022 21:22   CT ABDOMEN PELVIS W CONTRAST  Result Date: 08/23/2022 CLINICAL DATA:  Bowel obstruction suspected.  Abdominal pain EXAM: CT ABDOMEN AND PELVIS WITH CONTRAST TECHNIQUE: Multidetector CT imaging of the abdomen and pelvis was performed using the standard protocol following bolus administration of intravenous contrast. RADIATION DOSE REDUCTION: This exam was performed according to the departmental dose-optimization program which includes automated exposure control, adjustment of the mA and/or kV according to patient size and/or use of iterative reconstruction technique. CONTRAST:  17m OMNIPAQUE IOHEXOL  350 MG/ML SOLN COMPARISON:  04/08/2022 FINDINGS: Lower chest: No acute findings Hepatobiliary: No focal hepatic abnormality. Gallbladder unremarkable. Pancreas: No focal abnormality or ductal dilatation. Spleen: No focal abnormality.  Normal size. Adrenals/Urinary Tract: No adrenal abnormality. No focal renal abnormality. No stones or hydronephrosis. Urinary bladder is unremarkable. Stomach/Bowel: Stomach and proximal to mid small bowel dilated and fluid-filled compatible with small bowel obstruction. Transition point seems to be in the anterior lower abdomen in a similar location to prior obstruction. Distal small bowel decompressed as is the colon, grossly unremarkable. Vascular/Lymphatic: Aortic atherosclerosis. No evidence of aneurysm or adenopathy. Reproductive: Prostate enlargement. Other: No free fluid or free air. Musculoskeletal: No acute bony abnormality. Degenerative changes in the lumbar spine. IMPRESSION: Evidence of partial small bowel obstruction. Transition point appears to be in lower abdomen in similar position to prior bowel obstruction. Aortic atherosclerosis. Electronically Signed   By: KRolm BaptiseM.D.   On: 08/23/2022 18:18    Anti-infectives: Anti-infectives (From admission, onward)    None        Assessment/Plan pSBO - CT A/P with psbo. Hx appendectomy at age 89 ex lap, umbilical hernia repair in 2003 by Dr. BNinfa Linden and ex lap, loa in 2012 by Dr. WDonne Hazel Hx prior SBO's in 2018, 2019 and 2021, 2023 that resolved w/ medical management  - Clinically and radiographically appears to be resolving. Xray with contrast in colon. Abdominal pain, n/v resolved, ngt out, patient with robf. NT on exam. Can advance diet as tolerated. If diet advancement tolerated, can be d/c'd this afternoon from our standpoint.    FEN - FLD, ADAT, IVF per [facility] VTE - SCDs, okay for chemical prophylaxis from a general surgery standpoint ID - None  Foley - None   HTN COPD GERD   LOS: 1 day     MJillyn Ledger, P[facility] 08/25/2022, 8:29 AM Please see Amion for pager number during day hours 7:00am-4:30pm

## 2022-08-26 DIAGNOSIS — N400 Enlarged prostate without lower urinary tract symptoms: Secondary | ICD-10-CM | POA: Diagnosis not present

## 2022-08-26 DIAGNOSIS — J449 Chronic obstructive pulmonary disease, unspecified: Secondary | ICD-10-CM | POA: Diagnosis not present

## 2022-08-26 DIAGNOSIS — I5032 Chronic diastolic (congestive) heart failure: Secondary | ICD-10-CM | POA: Diagnosis not present

## 2022-08-26 DIAGNOSIS — K56600 Partial intestinal obstruction, unspecified as to cause: Secondary | ICD-10-CM | POA: Diagnosis not present

## 2022-08-26 NOTE — Progress Notes (Signed)
PROGRESS NOTE    Shane Clark  ZMO:[id number] DOB: Sep 20, 1951 DOA: 08/23/2022 PCP: Gaynelle Arabian, MD    Brief Narrative:   71 year old male with past medical history of diastolic congestive heart failure, benign prostatic hyperplasia, COPD, gastroesophageal reflux's, hypertension, hyperlipidemia and pertinent history of multiple hernia repairs in the past with previous small bowel stricture and multiple small bowel obstructions in the past presented hospital with abdominal pain nausea and vomiting with abdominal distention. In ED, CT imaging of the abdomen and pelvis revealed evidence of partial small bowel obstruction with transition point appearing to be in the lower abdomen.  NG-tube was placed in to intermittent suction in general surgery was consulted.  Patient was then admitted hospital for partial small bowel obstruction.   Assessment and Plan: Principal Problem:   Partial small bowel obstruction (HCC) Active Problems:   COPD (chronic obstructive pulmonary disease) (HCC)   Essential hypertension   Chronic diastolic CHF (congestive heart failure) (HCC)   Mixed hyperlipidemia   BPH (benign prostatic hyperplasia)   GERD (gastroesophageal reflux disease)   SBO (small bowel obstruction) (HCC)   Partial small bowel obstruction (HCC) CT scan showing partial small bowel obstruction.  General surgery on board.  Received NG tube n.p.o. IV fluids antiemetics.  Conservative treatment at this time.  Follow surgical recommendations.  NG tube was clamped and subsequently patient was advanced on the diet.  If he tolerates diet potential disposition home tomorrow.  COPD (chronic obstructive pulmonary disease) (HCC) Not in exacerbation.  Continue bronchodilators from home.  Essential hypertension As needed IV hydralazine.  Home medications on hold.  Patient is on telmisartan at home.  Chronic diastolic CHF (congestive heart failure) (HCC) Continue daily intake and output charting, daily  weights.  Mixed hyperlipidemia Continue statins.  BPH (benign prostatic hyperplasia) Continue tamsulosin.  GERD (gastroesophageal reflux disease) Continue IV PPI    DVT prophylaxis: enoxaparin (LOVENOX) injection 40 mg Start: 08/24/22 1800   Code Status:     Code Status: DNR  Disposition: Home  Status is: Observation  The patient will require care spanning > 2 midnights and should be moved to inpatient because: Bowel obstruction, NG tube under suction, conservative treatment,    Family Communication: None at bedside.  Consultants:  General surgery  Procedures:  NG tube placement  Antimicrobials:  None  Anti-infectives (From admission, onward)    None      Subjective: Today, patient was seen and examined at bedside.  Off NG tube.  Tolerating clears.  Denied nausea vomiting fever chills or rigor.  Has had bowel movement.  Objective: Vitals:   08/25/22 1551 08/25/22 1954 08/25/22 2053 08/26/22 0326  BP: 90/60 112/71  103/66  Pulse: 65 67  (!) 56  Resp: '16 17  16  '$ Temp: 98.3 F (36.8 C) 98.3 F (36.8 C)  97.8 F (36.6 C)  TempSrc: Oral Oral  Oral  SpO2: 95% 95% 95% 95%  Weight:      Height:       No intake or output data in the 24 hours ending 08/26/22 0737  Filed Weights   08/23/22 1352  Weight: 117 kg    Physical Examination: Body mass index is 39.23 kg/m.   General: Obese built HENT:   No scleral pallor or icterus noted. Oral mucosa is moist.   Chest:  Clear breath sounds.  Diminished breath sounds bilaterally. No crackles or wheezes.  CVS: S1 &S2 heard. No murmur.  Regular rate and rhythm. Abdomen: Soft, nontender,bowel sounds present  Extremities:  No cyanosis, clubbing or edema.  Peripheral pulses are palpable. Psych: Alert, awake and oriented, normal mood CNS:  No cranial nerve deficits.  Power equal in all extremities.   Skin: Warm and dry.  No rashes noted.  Data Reviewed:   CBC: Recent Labs  Lab 08/23/22 1437 08/24/22 0100  08/25/22 0028  WBC 12.8* 10.8* 10.3  NEUTROABS 10.6* 8.7*  --   HGB 14.8 13.5 12.8*  HCT 43.8 39.0 37.0*  MCV 96.5 94.7 96.6  PLT 232 212 183     Basic Metabolic Panel: Recent Labs  Lab 08/23/22 1437 08/24/22 0100 08/25/22 0028  NA 136 136 137  K 4.8 4.9 4.5  CL 103 103 104  CO2 '22 24 25  '$ GLUCOSE 106* 94 74  BUN 24* 21 16  CREATININE 1.23 1.26* 1.21  CALCIUM 9.0 8.5* 8.8*  MG  --  1.8 1.9     Liver Function Tests: Recent Labs  Lab 08/23/22 1437 08/24/22 0100 08/25/22 0028  AST 34 25 24  ALT 39 32 29  ALKPHOS 68 58 50  BILITOT 1.5* 1.2 1.2  PROT 7.2 6.4* 6.3*  ALBUMIN 3.9 3.4* 3.3*      Radiology Studies: DG Abd 1 View  Result Date: 08/24/2022 CLINICAL DATA:  NG tube placement. EXAM: ABDOMEN - 1 VIEW COMPARISON:  08/24/2022 FINDINGS: Enteric tube is again noted coiled overlying the proximal stomach. No significant changes are noted. IMPRESSION: Enteric tube coiled overlying the proximal stomach. Electronically Signed   By: Margarette Canada M.D.   On: 08/24/2022 13:39      LOS: 2 days    Flora Lipps, MD [facility] Available via Epic secure chat 7am-7pm After these hours, please refer to coverage provider listed on [email or web address] 08/26/2022, 7:37 AM

## 2022-08-26 NOTE — Discharge Summary (Signed)
Physician Discharge Summary   Patient: Shane Clark MRN: [id number] DOB: 1951/04/09  Admit date:     08/23/2022  Discharge date: 08/26/22  Discharge Physician: Flora Lipps   PCP: Gaynelle Arabian, MD   Recommendations at discharge:   Follow-up with your primary care provider in 1 week.   Check CBC BMP magnesium LFT in the next visit.  Discharge Diagnoses: Active Problems:   COPD (chronic obstructive pulmonary disease) (HCC)   Essential hypertension   Chronic diastolic CHF (congestive heart failure) (HCC)   Mixed hyperlipidemia   BPH (benign prostatic hyperplasia)   GERD (gastroesophageal reflux disease)  Principal Problem (Resolved):   Partial small bowel obstruction [facility])  Hospital Course: 71 year old male with past medical history of diastolic congestive heart failure, benign prostatic hyperplasia, COPD, gastroesophageal reflux's, hypertension, hyperlipidemia and pertinent history of multiple hernia repairs in the past with previous small bowel stricture and multiple small bowel obstructions in the past presented hospital with abdominal pain nausea and vomiting with abdominal distention. In ED, CT imaging of the abdomen and pelvis revealed evidence of partial small bowel obstruction with transition point appearing to be in the lower abdomen.  NG-tube was placed in to intermittent suction in general surgery was consulted.  Patient was then admitted hospital for partial small bowel obstruction.   Following conditions were addressed during hospitalization,  Partial small bowel obstruction (HCC) CT scan showing partial small bowel obstruction.  General surgery was consulted and patient was treated conservatively with NG tube IV fluids  antiemetics.  Clinically improved with conservative treatment.  He was able to tolerate diet and was considered okay for disposition home by surgery  COPD (chronic obstructive pulmonary disease) ([place]) Not in exacerbation.  Continue bronchodilators  from home.   Essential hypertension   Patient is on telmisartan at home.   Chronic diastolic CHF (congestive heart failure) (HCC) Remained compensated during hospitalization.  Will resume home medications on discharge.   Mixed hyperlipidemia Continue statins.   BPH (benign prostatic hyperplasia) Continue tamsulosin.   GERD (gastroesophageal reflux disease)  Consultants: General surgery  Procedures performed: NG tube placement and removal Disposition: Home Diet recommendation:  Discharge Diet Orders (From admission, onward)     Start     Ordered   08/26/22 0000  Diet - low sodium heart healthy        08/26/22 0918           DISCHARGE MEDICATION: Allergies as of 08/26/2022       Reactions   Codeine Shortness Of Breath   Percocet [oxycodone-acetaminophen] Anaphylaxis   Angiotensin Receptor Blockers Other (See Comments)   Dyspnea   Crestor [rosuvastatin]    Other reaction(s): Feet Hurt   Lyrica [pregabalin] Other (See Comments)   Headache   Mevacor [lovastatin]    Joint Pain   Pravachol [pravastatin] Other (See Comments)   Talwin [pentazocine] Nausea Only, Other (See Comments)   Stopped breathing   Zestril [lisinopril] Other (See Comments)   Dyspnea   Zetia [ezetimibe] Itching   Advair Diskus [fluticasone-salmeterol] Rash   unknown        Medication List     TAKE these medications    acetaminophen 500 MG tablet Commonly known as: TYLENOL Take 1,000 mg by mouth every 6 (six) hours as needed for moderate pain.   albuterol 108 (90 Base) MCG/ACT inhaler Commonly known as: VENTOLIN HFA Inhale 1-2 puffs into the lungs every 4 (four) hours as needed. What changed: reasons to take this   aspirin EC 81 MG  tablet Take 81 mg by mouth daily. Swallow whole.   azelastine 0.1 % nasal spray Commonly known as: ASTELIN Place 2 sprays into both nostrils 2 (two) times daily as needed for congestion.   diclofenac 75 MG EC tablet Commonly known as: VOLTAREN Take  1 tablet (75 mg total) by mouth 2 (two) times daily as needed. Do not take until finished with the steroid taper.  Do not take with any other antiinflammatories What changed:  reasons to take this additional instructions   loratadine 10 MG tablet Commonly known as: CLARITIN Take 10 mg by mouth daily as needed for allergies.   methocarbamol 750 MG tablet Commonly known as: Robaxin-750 Take 1 tablet (750 mg total) by mouth every 8 (eight) hours as needed for muscle spasms.   nitroGLYCERIN 0.4 MG SL tablet Commonly known as: NITROSTAT Place 1 tablet (0.4 mg total) under the tongue as needed. What changed:  when to take this reasons to take this   pantoprazole 40 MG tablet Commonly known as: Protonix Take 1 tablet (40 mg total) by mouth daily for acid reflux   predniSONE 10 MG (21) Tbpk tablet Commonly known as: STERAPRED UNI-PAK 21 TAB Take as directed   rosuvastatin 5 MG tablet Commonly known as: CRESTOR Take 1 tablet (5 mg total) by mouth daily.   Symbicort 80-4.5 MCG/ACT inhaler Generic drug: budesonide-formoterol Inhale 2 puffs into the lungs 2 (two) times daily.   tamsulosin 0.4 MG Caps capsule Commonly known as: FLOMAX Take 1 capsule (0.4 mg total) by mouth 30 minutes after the same meal each day for urinary symtomps   telmisartan 20 MG tablet Commonly known as: MICARDIS Take 1 tablet (20 mg total) by mouth daily for blood pressure   Zinc 100 MG Tabs Take 1 tablet by mouth daily.   ZINC PO Take 1 tablet by mouth daily.         Subjective Today, patient was seen and examined at bedside.  Feels better.  No nausea vomiting abdominal pain fever or chills.  Has tolerated solid diet.  Has had bowel movements.  Family at bedside.  Discharge Exam: Filed Weights   08/23/22 1352  Weight: 117 kg      08/26/2022    8:07 AM 08/26/2022    3:26 AM 08/25/2022    7:54 PM  Vitals with BMI  Systolic [phone]  Diastolic 69 66 71  Pulse 60 56 67  General: Obese  built, not in obvious distress HENT:   No scleral pallor or icterus noted. Oral mucosa is moist.  Chest:  Clear breath sounds.  Diminished breath sounds bilaterally. No crackles or wheezes.  CVS: S1 &S2 heard. No murmur.  Regular rate and rhythm. Abdomen: Soft, nontender, nondistended.  Bowel sounds are heard.   Extremities: No cyanosis, clubbing or edema.  Peripheral pulses are palpable. Psych: Alert, awake and oriented, normal mood CNS:  No cranial nerve deficits.  Power equal in all extremities.   Skin: Warm and dry.  No rashes noted.   Condition at discharge: good  The results of significant diagnostics from this hospitalization (including imaging, microbiology, ancillary and laboratory) are listed below for reference.   Imaging Studies: DG Abd 1 View  Result Date: 08/24/2022 CLINICAL DATA:  NG tube placement. EXAM: ABDOMEN - 1 VIEW COMPARISON:  08/24/2022 FINDINGS: Enteric tube is again noted coiled overlying the proximal stomach. No significant changes are noted. IMPRESSION: Enteric tube coiled overlying the proximal stomach. Electronically Signed   By: Margarette Canada  M.D.   On: 08/24/2022 13:39   DG Abd Portable 1V-Small Bowel Obstruction Protocol-initial, 8 hr delay  Result Date: 08/24/2022 CLINICAL DATA:  71 year old male with history of small-bowel obstruction. EXAM: PORTABLE ABDOMEN - 1 VIEW COMPARISON:  Abdominal radiograph 08/23/2022. FINDINGS: Nasogastric tube extends into the proximal stomach. No pathologic dilatation of small bowel or colon. Oral contrast material, gas and stool is noted throughout the colon and rectum. No pneumoperitoneum. IMPRESSION: 1. Nonobstructive bowel gas pattern. 2. No pneumoperitoneum. Electronically Signed   By: Vinnie Langton M.D.   On: 08/24/2022 07:19   DG Abd Portable 1V-Small Bowel Protocol-Position Verification  Result Date: 08/23/2022 CLINICAL DATA:  NG tube placement EXAM: PORTABLE ABDOMEN - 1 VIEW COMPARISON:  CT abdomen and pelvis earlier  today FINDINGS: Interval placement of enteric tube with tip and side port projecting over the stomach. Partially visualized prominent loops of small bowel compatible with obstruction as seen on CT earlier today. IMPRESSION: Enteric tube tip and side-port in the stomach. Electronically Signed   By: Placido Sou M.D.   On: 08/23/2022 21:22   CT ABDOMEN PELVIS W CONTRAST  Result Date: 08/23/2022 CLINICAL DATA:  Bowel obstruction suspected.  Abdominal pain EXAM: CT ABDOMEN AND PELVIS WITH CONTRAST TECHNIQUE: Multidetector CT imaging of the abdomen and pelvis was performed using the standard protocol following bolus administration of intravenous contrast. RADIATION DOSE REDUCTION: This exam was performed according to the departmental dose-optimization program which includes automated exposure control, adjustment of the mA and/or kV according to patient size and/or use of iterative reconstruction technique. CONTRAST:  85m OMNIPAQUE IOHEXOL 350 MG/ML SOLN COMPARISON:  04/08/2022 FINDINGS: Lower chest: No acute findings Hepatobiliary: No focal hepatic abnormality. Gallbladder unremarkable. Pancreas: No focal abnormality or ductal dilatation. Spleen: No focal abnormality.  Normal size. Adrenals/Urinary Tract: No adrenal abnormality. No focal renal abnormality. No stones or hydronephrosis. Urinary bladder is unremarkable. Stomach/Bowel: Stomach and proximal to mid small bowel dilated and fluid-filled compatible with small bowel obstruction. Transition point seems to be in the anterior lower abdomen in a similar location to prior obstruction. Distal small bowel decompressed as is the colon, grossly unremarkable. Vascular/Lymphatic: Aortic atherosclerosis. No evidence of aneurysm or adenopathy. Reproductive: Prostate enlargement. Other: No free fluid or free air. Musculoskeletal: No acute bony abnormality. Degenerative changes in the lumbar spine. IMPRESSION: Evidence of partial small bowel obstruction. Transition point  appears to be in lower abdomen in similar position to prior bowel obstruction. Aortic atherosclerosis. Electronically Signed   By: KRolm BaptiseM.D.   On: 08/23/2022 18:18   XR Lumbar Spine 2-3 Views  Result Date: 08/13/2022 X-rays demonstrate straightening of the lumbar spine.  Advanced multilevel degenerative changes worse at L4-5 L5-S1 with anterior osteophyte formation throughout   Microbiology: Results for orders placed or performed during the hospital encounter of 04/17/20  SARS Coronavirus 2 by RT PCR [facility] Nasopharyngeal Nasopharyngeal Swab     Status: None   Collection Time: 04/17/20  4:45 PM   Specimen: Nasopharyngeal Swab  Result Value Ref Range Status   SARS Coronavirus 2 NEGATIVE NEGATIVE Final    Comment: (NOTE) SARS-CoV-2 target nucleic acids are NOT DETECTED. The SARS-CoV-2 RNA is generally detectable in upper and lower respiratory specimens during the acute phase of infection. The lowest concentration of SARS-CoV-2 viral copies this assay can detect is 250 copies / mL. A negative result does not preclude SARS-CoV-2 infection and should not be used as the sole basis for treatment or other patient  management decisions.  A negative result may occur with improper specimen collection / handling, submission of specimen other than nasopharyngeal swab, presence of viral mutation(s) within the areas targeted by this assay, and inadequate number of viral copies (<250 copies / mL). A negative result must be combined with clinical observations, patient history, and epidemiological information. Fact Sheet for Patients:   StrictlyIdeas.no Fact Sheet for Healthcare Providers: BankingDealers.co.za This test is not yet approved or cleared  by the [place] FDA and has been authorized for detection and/or diagnosis of SARS-CoV-2 by FDA under an Emergency Use Authorization (EUA).  This  EUA will remain in effect (meaning this test can be used) for the duration of the COVID-19 declaration under Section 564(b)(1) of the Act, 21 U.S.C. section 360bbb-3(b)(1), unless the authorization is terminated or revoked sooner. Performed at [facility], [place] [address] [place], [place] [address]   Urine culture     Status: Abnormal   Collection Time: 04/17/20  7:10 PM   Specimen: Urine, Random  Result Value Ref Range Status   Specimen Description URINE, RANDOM  Final   Special Requests NONE  Final   Culture (A)  Final    80,000 COLONIES/mL STREPTOCOCCUS GROUP G Beta hemolytic streptococci are predictably susceptible to penicillin and other beta lactams. Susceptibility testing not routinely performed. Performed at [facility], [place] [address], [place], [place] [address]    Report Status 04/18/2020 FINAL  Final    Labs: CBC: Recent Labs  Lab 08/23/22 1437 08/24/22 0100 08/25/22 0028  WBC 12.8* 10.8* 10.3  NEUTROABS 10.6* 8.7*  --   HGB 14.8 13.5 12.8*  HCT 43.8 39.0 37.0*  MCV 96.5 94.7 96.6  PLT 232 212 295   Basic Metabolic Panel: Recent Labs  Lab 08/23/22 1437 08/24/22 0100 08/25/22 0028  NA 136 136 137  K 4.8 4.9 4.5  CL 103 103 104  CO2 '22 24 25  '$ GLUCOSE 106* 94 74  BUN 24* 21 16  CREATININE 1.23 1.26* 1.21  CALCIUM 9.0 8.5* 8.8*  MG  --  1.8 1.9   Liver Function Tests: Recent Labs  Lab 08/23/22 1437 08/24/22 0100 08/25/22 0028  AST 34 25 24  ALT 39 32 29  ALKPHOS 68 58 50  BILITOT 1.5* 1.2 1.2  PROT 7.2 6.4* 6.3*  ALBUMIN 3.9 3.4* 3.3*   CBG: No results for input(s): "GLUCAP" in the last 168 hours.  Discharge time spent: greater than 30 minutes.  Signed: Flora Lipps, MD [facility] 08/26/2022

## 2022-08-26 NOTE — Progress Notes (Signed)
Mobility Specialist - Progress Note   08/26/22 0933  Mobility  Activity Ambulated independently in hallway  Level of Assistance Independent  Assistive Device None  Distance Ambulated (ft) 1100 ft  Activity Response Tolerated well  $Mobility charge 1 Mobility    Pt ambulated independently in hallway. No physical assistance required.   Paulla Dolly Mobility Specialist

## 2022-08-26 NOTE — Progress Notes (Signed)
       Subjective: CC: Tolerating solid food without abdominal pain, n/v. Passing flatus. 4 liquid bms yesterday.  Objective: Vital signs in last 24 hours: Temp:  [97.8 F (36.6 C)-98.3 F (36.8 C)] 97.8 F (36.6 C) (09/26 0326) Pulse Rate:  [56-67] 56 (09/26 0326) Resp:  [16-17] 16 (09/26 0326) BP: (90-112)/(60-71) 103/66 (09/26 0326) SpO2:  [95 %] 95 % (09/26 0326) Last BM Date : 08/24/22  Intake/Output from previous day: No intake/output data recorded. Intake/Output this shift: No intake/output data recorded.  PE: Gen:  Alert, NAD, pleasant Abd: Obese, less distended, very soft, NT. +BS.  Lab Results:  Recent Labs    08/24/22 0100 08/25/22 0028  WBC 10.8* 10.3  HGB 13.5 12.8*  HCT 39.0 37.0*  PLT 212 183   BMET Recent Labs    08/24/22 0100 08/25/22 0028  NA 136 137  K 4.9 4.5  CL 103 104  CO2 24 25  GLUCOSE 94 74  BUN 21 16  CREATININE 1.26* 1.21  CALCIUM 8.5* 8.8*   PT/INR Recent Labs    08/23/22 1437  LABPROT 13.5  INR 1.0   CMP     Component Value Date/Time   NA 137 08/25/2022 0028   NA 137 08/06/2021 1114   K 4.5 08/25/2022 0028   CL 104 08/25/2022 0028   CO2 25 08/25/2022 0028   GLUCOSE 74 08/25/2022 0028   BUN 16 08/25/2022 0028   BUN 20 08/06/2021 1114   CREATININE 1.21 08/25/2022 0028   CALCIUM 8.8 (L) 08/25/2022 0028   PROT 6.3 (L) 08/25/2022 0028   ALBUMIN 3.3 (L) 08/25/2022 0028   AST 24 08/25/2022 0028   ALT 29 08/25/2022 0028   ALKPHOS 50 08/25/2022 0028   BILITOT 1.2 08/25/2022 0028   GFRNONAA >60 08/25/2022 0028   GFRAA >60 04/19/2020 0347   Lipase     Component Value Date/Time   LIPASE 32 08/23/2022 1437    Studies/Results: DG Abd 1 View  Result Date: 08/24/2022 CLINICAL DATA:  NG tube placement. EXAM: ABDOMEN - 1 VIEW COMPARISON:  08/24/2022 FINDINGS: Enteric tube is again noted coiled overlying the proximal stomach. No significant changes are noted. IMPRESSION: Enteric tube coiled overlying the proximal  stomach. Electronically Signed   By: Margarette Canada M.D.   On: 08/24/2022 13:39    Anti-infectives: Anti-infectives (From admission, onward)    None        Assessment/Plan pSBO - CT A/P with psbo. Hx appendectomy at age 32; ex lap, umbilical hernia repair in 2003 by Dr. Ninfa Linden; and ex lap, loa in 2012 by Dr. Donne Hazel. Hx prior SBO's in 2018, 2019 and 2021, 2023 that resolved w/ medical management  - Clinically and radiographically appears to have resolved. Xray with contrast in colon. Abdominal pain, n/v resolved, patient tolerating diet with robf. NT on exam. Okay for d/c from our standpoint. We will sign off.    FEN - Soft, IVF per [facility] VTE - SCDs, okay for chemical prophylaxis from a general surgery standpoint ID - None  Foley - None   HTN COPD GERD   LOS: 2 days    Jillyn Ledger , [facility] 08/26/2022, 7:57 AM Please see Amion for pager number during day hours 7:00am-4:30pm

## 2022-08-26 NOTE — Plan of Care (Signed)

## 2022-08-28 ENCOUNTER — Other Ambulatory Visit (HOSPITAL_COMMUNITY): Payer: Self-pay

## 2022-09-03 DIAGNOSIS — K56609 Unspecified intestinal obstruction, unspecified as to partial versus complete obstruction: Secondary | ICD-10-CM | POA: Diagnosis not present

## 2022-09-03 DIAGNOSIS — I10 Essential (primary) hypertension: Secondary | ICD-10-CM | POA: Diagnosis not present

## 2022-09-03 DIAGNOSIS — Z23 Encounter for immunization: Secondary | ICD-10-CM | POA: Diagnosis not present

## 2022-10-29 ENCOUNTER — Other Ambulatory Visit (HOSPITAL_COMMUNITY): Payer: Self-pay

## 2022-10-31 ENCOUNTER — Other Ambulatory Visit (HOSPITAL_COMMUNITY): Payer: Self-pay

## 2022-12-18 ENCOUNTER — Other Ambulatory Visit (HOSPITAL_COMMUNITY): Payer: Self-pay

## 2022-12-18 ENCOUNTER — Other Ambulatory Visit: Payer: Self-pay | Admitting: Internal Medicine

## 2022-12-18 MED ORDER — ROSUVASTATIN CALCIUM 5 MG PO TABS
5.0000 mg | ORAL_TABLET | Freq: Every day | ORAL | 1 refills | Status: DC
  Filled 2022-12-18: qty 90, 90d supply, fill #0
  Filled 2023-03-13: qty 90, 90d supply, fill #1

## 2022-12-31 ENCOUNTER — Ambulatory Visit: Payer: Medicare Other | Admitting: Orthopaedic Surgery

## 2022-12-31 ENCOUNTER — Ambulatory Visit (INDEPENDENT_AMBULATORY_CARE_PROVIDER_SITE_OTHER): Payer: Medicare Other

## 2022-12-31 DIAGNOSIS — M1712 Unilateral primary osteoarthritis, left knee: Secondary | ICD-10-CM | POA: Diagnosis not present

## 2022-12-31 MED ORDER — LIDOCAINE HCL 1 % IJ SOLN
2.0000 mL | INTRAMUSCULAR | Status: AC | PRN
  Administered 2022-12-31: 2 mL

## 2022-12-31 MED ORDER — BUPIVACAINE HCL 0.5 % IJ SOLN
2.0000 mL | INTRAMUSCULAR | Status: AC | PRN
  Administered 2022-12-31: 2 mL via INTRA_ARTICULAR

## 2022-12-31 MED ORDER — METHYLPREDNISOLONE ACETATE 40 MG/ML IJ SUSP
40.0000 mg | INTRAMUSCULAR | Status: AC | PRN
  Administered 2022-12-31: 40 mg via INTRA_ARTICULAR

## 2022-12-31 NOTE — Progress Notes (Signed)
Office Visit Note   Patient: Shane Clark           Date of Birth: 12-01-1951           MRN: [id number] Visit Date: 12/31/2022              Requested by: Gaynelle Arabian, MD [address] [place],  [place] [address] PCP: Gaynelle Arabian, MD   Assessment & Plan: Visit Diagnoses:  1. Primary osteoarthritis of left knee     Plan: Impression is acute left medial knee pain.  Probable medial meniscal tear versus OA flare.  Not reporting any mechanical symptoms.  He is bone-on-bone in the medial compartment.  Treatment options discussed and we will try cortisone injection today.  Relative rest activity modification.  Follow-up if symptoms not improve in about 4 weeks.  Follow-Up Instructions: No follow-ups on file.   Orders:  Orders Placed This Encounter  Procedures   XR KNEE 3 VIEW LEFT   No orders of the defined types were placed in this encounter.     Procedures: Large Joint Inj: L knee on 12/31/2022 9:58 AM Details: 22 G needle Medications: 2 mL bupivacaine 0.5 %; 2 mL lidocaine 1 %; 40 mg methylPREDNISolone acetate 40 MG/ML Outcome: tolerated well, no immediate complications Patient was prepped and draped in the usual sterile fashion.       Clinical Data: No additional findings.   Subjective: Chief Complaint  Patient presents with   Left Knee - Pain    HPI Shane Clark is here for evaluation of left knee pain for about 3 weeks.  Denies any injuries or changes in activity.  History of left knee scope in 2005.  Has had trouble standing and weightbearing.  Review of Systems  Constitutional: Negative.   HENT: Negative.    Eyes: Negative.   Respiratory: Negative.    Cardiovascular: Negative.   Gastrointestinal: Negative.   Endocrine: Negative.   Genitourinary: Negative.   Skin: Negative.   Allergic/Immunologic: Negative.   Neurological: Negative.   Hematological: Negative.   Psychiatric/Behavioral: Negative.    All other systems reviewed and are  negative.    Objective: Vital Signs: There were no vitals taken for this visit.  Physical Exam Vitals and nursing note reviewed.  Constitutional:      Appearance: He is well-developed.  Pulmonary:     Effort: Pulmonary effort is normal.  Abdominal:     Palpations: Abdomen is soft.  Skin:    General: Skin is warm.  Neurological:     Mental Status: He is alert and oriented to person, place, and time.  Psychiatric:        Behavior: Behavior normal.        Thought Content: Thought content normal.        Judgment: Judgment normal.     Ortho Exam Examination left knee shows medial joint line tenderness.  Normal range of motion.  Pain with weightbearing.  Collaterals and cruciates are stable.  No joint effusion. Specialty Comments:  No specialty comments available.  Imaging: XR KNEE 3 VIEW LEFT  Result Date: 12/31/2022 Advanced tricompartmental degenerative joint disease.  Bone-on-bone joint space narrowing medial compartment.      PMFS History: Patient Active Problem List   Diagnosis Date Noted   Chronic diastolic CHF (congestive heart failure) ([facility]) 08/23/2022   GERD (gastroesophageal reflux disease)    Mixed hyperlipidemia    Primary osteoarthritis of right knee 01/24/2021   Primary osteoarthritis of left knee 01/24/2021  Obesity (BMI 30-39.9) 02/16/2017   SLAP tear of shoulder 02/11/2017   Impingement syndrome of left shoulder 02/05/2017   Hx of adenomatous colonic polyps 03/22/2016   Essential hypertension 02/05/2016   COPD (chronic obstructive pulmonary disease) ([facility]) 31/51/7616   Diastolic dysfunction 07/37/1062   BPH (benign prostatic hyperplasia) 11/21/2011   Past Medical History:  Diagnosis Date   Allergy    Arthritis    Asthma    BPH (benign prostatic hyperplasia)    Chronic back pain    "all over"   COPD (chronic obstructive pulmonary disease) ([facility])    GERD (gastroesophageal reflux disease)    Hx of adenomatous colonic polyps 03/22/2016    Hypertension    Influenza 11/20/2011   Sepsis ([facility]) 02/05/2016    Family History  Problem Relation Age of Onset   Coronary artery disease Father    Pancreatic cancer Brother    Stroke Brother    Heart disease Brother    Dementia Mother    Diabetes type II Other    Colon cancer Neg Hx    Colon polyps Neg Hx    Rectal cancer Neg Hx    Stomach cancer Neg Hx     Past Surgical History:  Procedure Laterality Date   ABDOMINAL HERNIA REPAIR     ANTERIOR CERVICAL DECOMP/DISCECTOMY FUSION     C6-8   APPENDECTOMY  1960s   BACK SURGERY     COLONOSCOPY     HERNIA REPAIR     INSERTION DISTRACTION DEVICE LUMBAR POSTERIOR SPINOUS PROCESS     KNEE ARTHROSCOPY Left    SHOULDER SURGERY Left 02/11/2017   SMALL BOWEL REPAIR     Social History   Occupational History   Not on file  Tobacco Use   Smoking status: Former    Packs/day: 0.12    Years: 20.00    Total pack years: 2.40    Types: Cigarettes   Smokeless tobacco: Former   Tobacco comments:    "quit smoking cigarettes in the 1980s"  Substance and Sexual Activity   Alcohol use: No    Alcohol/week: 0.0 standard drinks of alcohol   Drug use: No   Sexual activity: Yes

## 2023-01-23 ENCOUNTER — Other Ambulatory Visit (HOSPITAL_COMMUNITY): Payer: Self-pay

## 2023-01-23 DIAGNOSIS — S31109A Unspecified open wound of abdominal wall, unspecified quadrant without penetration into peritoneal cavity, initial encounter: Secondary | ICD-10-CM | POA: Diagnosis not present

## 2023-01-23 DIAGNOSIS — J449 Chronic obstructive pulmonary disease, unspecified: Secondary | ICD-10-CM | POA: Diagnosis not present

## 2023-01-23 DIAGNOSIS — K219 Gastro-esophageal reflux disease without esophagitis: Secondary | ICD-10-CM | POA: Diagnosis not present

## 2023-01-23 DIAGNOSIS — I700 Atherosclerosis of aorta: Secondary | ICD-10-CM | POA: Diagnosis not present

## 2023-01-23 DIAGNOSIS — I10 Essential (primary) hypertension: Secondary | ICD-10-CM | POA: Diagnosis not present

## 2023-01-23 DIAGNOSIS — Z0000 Encounter for general adult medical examination without abnormal findings: Secondary | ICD-10-CM | POA: Diagnosis not present

## 2023-01-23 DIAGNOSIS — I2510 Atherosclerotic heart disease of native coronary artery without angina pectoris: Secondary | ICD-10-CM | POA: Diagnosis not present

## 2023-01-23 DIAGNOSIS — Z86010 Personal history of colonic polyps: Secondary | ICD-10-CM | POA: Diagnosis not present

## 2023-01-23 DIAGNOSIS — R7301 Impaired fasting glucose: Secondary | ICD-10-CM | POA: Diagnosis not present

## 2023-01-23 MED ORDER — TELMISARTAN 20 MG PO TABS
20.0000 mg | ORAL_TABLET | Freq: Every day | ORAL | 4 refills | Status: DC
  Filled 2023-01-23: qty 90, 90d supply, fill #0
  Filled 2023-04-28: qty 90, 90d supply, fill #1
  Filled 2023-08-04: qty 90, 90d supply, fill #2
  Filled 2023-11-03: qty 90, 90d supply, fill #3

## 2023-01-23 MED ORDER — PANTOPRAZOLE SODIUM 40 MG PO TBEC
40.0000 mg | DELAYED_RELEASE_TABLET | Freq: Every day | ORAL | 4 refills | Status: DC
  Filled 2023-01-23: qty 90, 90d supply, fill #0
  Filled 2023-04-28: qty 90, 90d supply, fill #1
  Filled 2023-08-04: qty 90, 90d supply, fill #2
  Filled 2023-11-03: qty 90, 90d supply, fill #3

## 2023-01-23 MED ORDER — BUDESONIDE-FORMOTEROL FUMARATE 80-4.5 MCG/ACT IN AERO
2.0000 | INHALATION_SPRAY | Freq: Two times a day (BID) | RESPIRATORY_TRACT | 3 refills | Status: DC
  Filled 2023-02-18: qty 30.6, 90d supply, fill #0
  Filled 2023-04-28 – 2023-04-29 (×2): qty 30.6, 90d supply, fill #1
  Filled 2023-08-21: qty 30.6, 90d supply, fill #2
  Filled 2023-11-15: qty 10.2, 30d supply, fill #3
  Filled 2023-11-16: qty 20.4, 60d supply, fill #3

## 2023-01-23 MED ORDER — ALBUTEROL SULFATE HFA 108 (90 BASE) MCG/ACT IN AERS
1.0000 | INHALATION_SPRAY | RESPIRATORY_TRACT | 3 refills | Status: AC
  Filled 2023-01-23: qty 6.7, 25d supply, fill #0
  Filled 2023-02-18: qty 6.7, 25d supply, fill #1
  Filled 2023-08-21: qty 6.7, 25d supply, fill #2

## 2023-01-23 MED ORDER — TAMSULOSIN HCL 0.4 MG PO CAPS
0.4000 mg | ORAL_CAPSULE | Freq: Every day | ORAL | 4 refills | Status: DC
  Filled 2023-01-23: qty 90, 90d supply, fill #0
  Filled 2023-04-28: qty 90, 90d supply, fill #1
  Filled 2023-08-04: qty 90, 90d supply, fill #2
  Filled 2023-11-15: qty 90, 90d supply, fill #3

## 2023-02-02 DIAGNOSIS — L7682 Other postprocedural complications of skin and subcutaneous tissue: Secondary | ICD-10-CM | POA: Diagnosis not present

## 2023-02-19 ENCOUNTER — Other Ambulatory Visit (HOSPITAL_COMMUNITY): Payer: Self-pay

## 2023-03-13 ENCOUNTER — Other Ambulatory Visit (HOSPITAL_COMMUNITY): Payer: Self-pay

## 2023-03-16 ENCOUNTER — Encounter: Payer: Self-pay | Admitting: Internal Medicine

## 2023-04-24 ENCOUNTER — Encounter: Payer: Self-pay | Admitting: Internal Medicine

## 2023-04-28 ENCOUNTER — Other Ambulatory Visit: Payer: Self-pay

## 2023-04-28 ENCOUNTER — Encounter (HOSPITAL_BASED_OUTPATIENT_CLINIC_OR_DEPARTMENT_OTHER): Payer: Self-pay

## 2023-04-28 ENCOUNTER — Emergency Department (HOSPITAL_BASED_OUTPATIENT_CLINIC_OR_DEPARTMENT_OTHER): Payer: Medicare Other

## 2023-04-28 ENCOUNTER — Inpatient Hospital Stay (HOSPITAL_BASED_OUTPATIENT_CLINIC_OR_DEPARTMENT_OTHER)
Admission: EM | Admit: 2023-04-28 | Discharge: 2023-05-01 | DRG: 389 | Disposition: A | Discharge status: Home / Self Care | Payer: Medicare Other | Attending: Family Medicine | Admitting: Family Medicine

## 2023-04-28 ENCOUNTER — Other Ambulatory Visit (HOSPITAL_COMMUNITY): Payer: Self-pay

## 2023-04-28 DIAGNOSIS — Z823 Family history of stroke: Secondary | ICD-10-CM

## 2023-04-28 DIAGNOSIS — E669 Obesity, unspecified: Secondary | ICD-10-CM | POA: Diagnosis present

## 2023-04-28 DIAGNOSIS — D72829 Elevated white blood cell count, unspecified: Secondary | ICD-10-CM

## 2023-04-28 DIAGNOSIS — S0120XA Unspecified open wound of nose, initial encounter: Secondary | ICD-10-CM | POA: Diagnosis not present

## 2023-04-28 DIAGNOSIS — Z888 Allergy status to other drugs, medicaments and biological substances status: Secondary | ICD-10-CM

## 2023-04-28 DIAGNOSIS — Z79899 Other long term (current) drug therapy: Secondary | ICD-10-CM | POA: Diagnosis not present

## 2023-04-28 DIAGNOSIS — K5989 Other specified functional intestinal disorders: Secondary | ICD-10-CM | POA: Diagnosis not present

## 2023-04-28 DIAGNOSIS — I110 Hypertensive heart disease with heart failure: Secondary | ICD-10-CM | POA: Diagnosis not present

## 2023-04-28 DIAGNOSIS — Z833 Family history of diabetes mellitus: Secondary | ICD-10-CM | POA: Diagnosis not present

## 2023-04-28 DIAGNOSIS — I10 Essential (primary) hypertension: Secondary | ICD-10-CM | POA: Diagnosis present

## 2023-04-28 DIAGNOSIS — K219 Gastro-esophageal reflux disease without esophagitis: Secondary | ICD-10-CM | POA: Diagnosis present

## 2023-04-28 DIAGNOSIS — N400 Enlarged prostate without lower urinary tract symptoms: Secondary | ICD-10-CM | POA: Diagnosis present

## 2023-04-28 DIAGNOSIS — Z6837 Body mass index (BMI) 37.0-37.9, adult: Secondary | ICD-10-CM | POA: Diagnosis not present

## 2023-04-28 DIAGNOSIS — Z66 Do not resuscitate: Secondary | ICD-10-CM | POA: Diagnosis present

## 2023-04-28 DIAGNOSIS — Z981 Arthrodesis status: Secondary | ICD-10-CM | POA: Diagnosis not present

## 2023-04-28 DIAGNOSIS — J4489 Other specified chronic obstructive pulmonary disease: Secondary | ICD-10-CM | POA: Diagnosis not present

## 2023-04-28 DIAGNOSIS — Z885 Allergy status to narcotic agent status: Secondary | ICD-10-CM | POA: Diagnosis not present

## 2023-04-28 DIAGNOSIS — E782 Mixed hyperlipidemia: Secondary | ICD-10-CM | POA: Diagnosis not present

## 2023-04-28 DIAGNOSIS — Z7982 Long term (current) use of aspirin: Secondary | ICD-10-CM | POA: Diagnosis not present

## 2023-04-28 DIAGNOSIS — J449 Chronic obstructive pulmonary disease, unspecified: Secondary | ICD-10-CM | POA: Diagnosis present

## 2023-04-28 DIAGNOSIS — Y828 Other medical devices associated with adverse incidents: Secondary | ICD-10-CM | POA: Diagnosis not present

## 2023-04-28 DIAGNOSIS — Z86010 Personal history of colonic polyps: Secondary | ICD-10-CM | POA: Diagnosis not present

## 2023-04-28 DIAGNOSIS — I5032 Chronic diastolic (congestive) heart failure: Secondary | ICD-10-CM | POA: Diagnosis present

## 2023-04-28 DIAGNOSIS — K56600 Partial intestinal obstruction, unspecified as to cause: Secondary | ICD-10-CM | POA: Diagnosis not present

## 2023-04-28 DIAGNOSIS — Z7951 Long term (current) use of inhaled steroids: Secondary | ICD-10-CM

## 2023-04-28 DIAGNOSIS — Z8249 Family history of ischemic heart disease and other diseases of the circulatory system: Secondary | ICD-10-CM

## 2023-04-28 DIAGNOSIS — K5669 Other partial intestinal obstruction: Secondary | ICD-10-CM | POA: Diagnosis not present

## 2023-04-28 DIAGNOSIS — Z800 Family history of malignant neoplasm of digestive organs: Secondary | ICD-10-CM

## 2023-04-28 DIAGNOSIS — Z87891 Personal history of nicotine dependence: Secondary | ICD-10-CM | POA: Diagnosis not present

## 2023-04-28 DIAGNOSIS — Y92239 Unspecified place in hospital as the place of occurrence of the external cause: Secondary | ICD-10-CM | POA: Diagnosis not present

## 2023-04-28 DIAGNOSIS — Y848 Other medical procedures as the cause of abnormal reaction of the patient, or of later complication, without mention of misadventure at the time of the procedure: Secondary | ICD-10-CM | POA: Diagnosis not present

## 2023-04-28 DIAGNOSIS — K56609 Unspecified intestinal obstruction, unspecified as to partial versus complete obstruction: Principal | ICD-10-CM

## 2023-04-28 DIAGNOSIS — Z4659 Encounter for fitting and adjustment of other gastrointestinal appliance and device: Secondary | ICD-10-CM | POA: Diagnosis not present

## 2023-04-28 LAB — CBC
HCT: 41.8 % (ref 39.0–52.0)
Hemoglobin: 14.5 g/dL (ref 13.0–17.0)
MCH: 33 pg (ref 26.0–34.0)
MCHC: 34.7 g/dL (ref 30.0–36.0)
MCV: 95.2 fL (ref 80.0–100.0)
Platelets: 210 10*3/uL (ref 150–400)
RBC: 4.39 MIL/uL (ref 4.22–5.81)
RDW: 13.1 % (ref 11.5–15.5)
WBC: 12 10*3/uL — ABNORMAL HIGH (ref 4.0–10.5)
nRBC: 0 % (ref 0.0–0.2)

## 2023-04-28 LAB — URINALYSIS, ROUTINE W REFLEX MICROSCOPIC
Bacteria, UA: NONE SEEN
Bilirubin Urine: NEGATIVE
Glucose, UA: NEGATIVE mg/dL
Hgb urine dipstick: NEGATIVE
Ketones, ur: NEGATIVE mg/dL
Nitrite: NEGATIVE
Specific Gravity, Urine: 1.029 (ref 1.005–1.030)
pH: 7.5 (ref 5.0–8.0)

## 2023-04-28 LAB — COMPREHENSIVE METABOLIC PANEL
ALT: 20 U/L (ref 0–44)
AST: 20 U/L (ref 15–41)
Albumin: 4.7 g/dL (ref 3.5–5.0)
Alkaline Phosphatase: 74 U/L (ref 38–126)
Anion gap: 9 (ref 5–15)
BUN: 20 mg/dL (ref 8–23)
CO2: 24 mmol/L (ref 22–32)
Calcium: 9.1 mg/dL (ref 8.9–10.3)
Chloride: 104 mmol/L (ref 98–111)
Creatinine, Ser: 0.96 mg/dL (ref 0.61–1.24)
GFR, Estimated: >60 mL/min (ref >60)
Glucose, Bld: 99 mg/dL (ref 70–99)
Potassium: 4.3 mmol/L (ref 3.5–5.1)
Sodium: 137 mmol/L (ref 135–145)
Total Bilirubin: 1 mg/dL (ref 0.3–1.2)
Total Protein: 7.5 g/dL (ref 6.5–8.1)

## 2023-04-28 LAB — LIPASE, BLOOD: Lipase: 27 U/L (ref 11–51)

## 2023-04-28 MED ORDER — IOHEXOL 300 MG/ML  SOLN
100.0000 mL | Freq: Once | INTRAMUSCULAR | Status: AC | PRN
  Administered 2023-04-28: 85 mL via INTRAVENOUS

## 2023-04-28 MED ORDER — SODIUM CHLORIDE 0.9 % IV SOLN: INTRAVENOUS | Status: AC

## 2023-04-28 MED ORDER — FENTANYL CITRATE PF 50 MCG/ML IJ SOSY
50.0000 ug | PREFILLED_SYRINGE | Freq: Once | INTRAMUSCULAR | Status: AC
  Administered 2023-04-28: 50 ug via INTRAVENOUS
  Filled 2023-04-28: qty 1

## 2023-04-28 MED ORDER — SODIUM CHLORIDE 0.9 % IV BOLUS
500.0000 mL | Freq: Once | INTRAVENOUS | Status: AC
  Administered 2023-04-28: 500 mL via INTRAVENOUS

## 2023-04-28 MED ORDER — ONDANSETRON HCL 4 MG/2ML IJ SOLN: 4.0000 mg | Freq: Four times a day (QID) | INTRAMUSCULAR | Status: DC | PRN

## 2023-04-28 MED ORDER — FENTANYL CITRATE PF 50 MCG/ML IJ SOSY: 12.5000 ug | PREFILLED_SYRINGE | INTRAMUSCULAR | Status: DC | PRN

## 2023-04-28 MED ORDER — PROCHLORPERAZINE EDISYLATE 10 MG/2ML IJ SOLN
5.0000 mg | Freq: Once | INTRAMUSCULAR | Status: AC
  Administered 2023-04-28: 5 mg via INTRAVENOUS
  Filled 2023-04-28: qty 2

## 2023-04-28 MED ORDER — ONDANSETRON HCL 4 MG/2ML IJ SOLN
4.0000 mg | Freq: Once | INTRAMUSCULAR | Status: AC
  Administered 2023-04-28: 4 mg via INTRAVENOUS
  Filled 2023-04-28: qty 2

## 2023-04-28 MED ORDER — LABETALOL HCL 5 MG/ML IV SOLN: 5.0000 mg | INTRAVENOUS | Status: DC | PRN

## 2023-04-28 MED ORDER — NALOXONE HCL 0.4 MG/ML IJ SOLN: 0.4000 mg | INTRAMUSCULAR | Status: DC | PRN

## 2023-04-28 MED ORDER — PANTOPRAZOLE SODIUM 40 MG IV SOLR
40.0000 mg | INTRAVENOUS | Status: DC
  Administered 2023-04-28 – 2023-04-30 (×2): 40 mg via INTRAVENOUS
  Filled 2023-04-28 (×3): qty 10

## 2023-04-28 MED ORDER — LIDOCAINE HCL URETHRAL/MUCOSAL 2 % EX GEL
1.0000 | Freq: Once | CUTANEOUS | Status: AC
  Administered 2023-04-28: 1 via TOPICAL
  Filled 2023-04-28: qty 11

## 2023-04-28 NOTE — H&P (Signed)
History and Physical    Shane Clark:[id number] DOB: 1951/01/24 DOA: 04/28/2023  PCP: [name], MD  Patient coming from: [facility] ED  Chief Complaint: Abdominal pain  HPI: Shane Clark is a 72 y.o. male with medical history significant of COPD, BPH, GERD, hypertension, hyperlipidemia, chronic HFpEF, history of multiple hernia repairs in the past with previous small bowel stricture and multiple small bowel obstructions presented to ED with complaints of abdominal pain, nausea, and diarrhea.  Afebrile.  Labs significant for WBC 12.0, normal lipase and LFTs, UA not suggestive of infection.  CT abdomen pelvis showing findings most consistent with partial small bowel obstruction versus early mechanical obstruction.  Showing no evidence of high-grade obstruction and no portal venous gas or pneumatosis. NG tube placed in the ED.  Patient was given fentanyl, Zofran, Compazine, and 500 cc normal saline.  Patient states yesterday morning he had raisin bran cereal for breakfast which is something he normally eats.  Soon after breakfast he started having severe left lower quadrant abdominal pain and had 1 episode of diarrhea.  Also started having nausea and dry heaves.  He did have a bowel movement prior to breakfast this morning and has been having regular bowel movements and not constipated.  He reports improvement of his abdominal pain since after placement of NG tube.  Reports history of small bowel obstructions multiple times in the past.  No other complaints.  Denies fevers, chills, cough, shortness of breath, or chest pain.  Denies any urinary symptoms.  Review of Systems:  Review of Systems  All other systems reviewed and are negative.   Past Medical History:  Diagnosis Date   Allergy    Arthritis    Asthma    BPH (benign prostatic hyperplasia)    Chronic back pain    "all over"   COPD (chronic obstructive pulmonary disease) (H[facility])    GERD (gastroesophageal reflux disease)    Hx of  adenomatous colonic polyps 03/22/2016   Hypertension    Influenza 11/20/2011   Sepsis ([facility]) 02/05/2016    Past Surgical History:  Procedure Laterality Date   ABDOMINAL HERNIA REPAIR     ANTERIOR CERVICAL DECOMP/DISCECTOMY FUSION     C6-8   APPENDECTOMY  1960s   BACK SURGERY     COLONOSCOPY     HERNIA REPAIR     INSERTION DISTRACTION DEVICE LUMBAR POSTERIOR SPINOUS PROCESS     KNEE ARTHROSCOPY Left    SHOULDER SURGERY Left 02/11/2017   SMALL BOWEL REPAIR       reports that he has quit smoking. His smoking use included cigarettes. He has a 2.40 pack-year smoking history. He has quit using smokeless tobacco. He reports that he does not drink alcohol and does not use drugs.  Allergies  Allergen Reactions   Codeine Shortness Of Breath   Percocet [Oxycodone-Acetaminophen] Anaphylaxis   Angiotensin Receptor Blockers Other (See Comments)    Dyspnea   Crestor [Rosuvastatin]     Other reaction(s): Feet Hurt   Lyrica [Pregabalin] Other (See Comments)    Headache   Mevacor [Lovastatin]     Joint Pain   Pravachol [Pravastatin] Other (See Comments)   Talwin [Pentazocine] Nausea Only and Other (See Comments)    Stopped breathing   Zestril [Lisinopril] Other (See Comments)    Dyspnea   Zetia [Ezetimibe] Itching   Advair Diskus [Fluticasone-Salmeterol] Rash    unknown    Family History  Problem Relation Age of Onset   Coronary artery disease Father  Pancreatic cancer Brother    Stroke Brother    Heart disease Brother    Dementia Mother    Diabetes type II Other    Colon cancer Neg Hx    Colon polyps Neg Hx    Rectal cancer Neg Hx    Stomach cancer Neg Hx     Prior to Admission medications   Medication Sig Start Date End Date Taking? Authorizing Provider  acetaminophen (TYLENOL) 500 MG tablet Take 1,000 mg by mouth every 6 (six) hours as needed for moderate pain.   Yes [provider]  albuterol (PROAIR HFA) 108 (90 Base) MCG/ACT inhaler Inhale 1-2 puffs into the  lungs every 4 (four) hours as needed. Patient taking differently: Inhale 1-2 puffs into the lungs every 6 (six) hours as needed for wheezing or shortness of breath. 01/23/23  Yes   aspirin EC 81 MG tablet Take 81 mg by mouth daily. Swallow whole.   Yes [provider]  azelastine (ASTELIN) 0.1 % nasal spray Place 2 sprays into both nostrils 2 (two) times daily as needed for congestion. 01/06/20  Yes [provider]  budesonide-formoterol (SYMBICORT) 80-4.5 MCG/ACT inhaler Inhale 2 puffs into the lungs 2 (two) times daily. 01/23/23  Yes   diclofenac (VOLTAREN) 75 MG EC tablet Take 1 tablet (75 mg total) by mouth 2 (two) times daily as needed. Do not take until finished with the steroid taper.  Do not take with any other antiinflammatories Patient taking differently: Take 75 mg by mouth 2 (two) times daily as needed for mild pain. 08/13/22  Yes Cristie Hem, PA-C  loratadine (CLARITIN) 10 MG tablet Take 10 mg by mouth daily.   Yes [provider]  Multiple Vitamins-Minerals (ZINC PO) Take 1 tablet by mouth daily.   Yes [provider]  nitroGLYCERIN (NITROSTAT) 0.4 MG SL tablet Place 1 tablet (0.4 mg total) under the tongue as needed. Patient taking differently: Place 0.4 mg under the tongue every 5 (five) minutes as needed for chest pain. 07/16/21  Yes Pricilla Riffle, MD  pantoprazole (PROTONIX) 40 MG tablet Take 1 tablet (40 mg total) by mouth daily for acid reflux. 01/23/23  Yes   rosuvastatin (CRESTOR) 5 MG tablet Take 1 tablet (5 mg total) by mouth daily. 12/18/22  Yes Pricilla Riffle, MD  tamsulosin (FLOMAX) 0.4 MG CAPS capsule Take 1 capsule (0.4 mg total) by mouth daily, 30 minutes after the same meal each day for urinary symptoms 01/23/23  Yes   telmisartan (MICARDIS) 20 MG tablet Take 1 tablet (20 mg total) by mouth daily for blood pressure 01/23/23  Yes   Zinc 100 MG TABS Take 1 tablet by mouth daily.   Yes [provider]  albuterol (VENTOLIN HFA) 108 (90  Base) MCG/ACT inhaler Inhale 1-2 puffs into the lungs every 4 (four) hours as needed. Patient not taking: Reported on 04/28/2023 05/03/21     methocarbamol (ROBAXIN-750) 750 MG tablet Take 1 tablet (750 mg total) by mouth every 8 (eight) hours as needed for muscle spasms. Patient not taking: Reported on 04/28/2023 08/13/22   Cristie Hem, PA-C  predniSONE (STERAPRED UNI-PAK 21 TAB) 10 MG (21) TBPK tablet Take as directed Patient not taking: Reported on 08/24/2022 08/13/22   Cristie Hem, PA-C    Physical Exam: Vitals:   04/28/23 2000 04/28/23 2015 04/28/23 2030 04/28/23 2147  BP: 126/63  (!) 108/58 105/73  Pulse: 91 90 88 91  Resp:   18 16  Temp:  98.6 F (37 C)  TempSrc:      SpO2: 92% 94% 93% 96%  Weight:      Height:        Physical Exam Vitals reviewed.  Constitutional:      General: He is not in acute distress. HENT:     Head: Normocephalic and atraumatic.  Eyes:     Extraocular Movements: Extraocular movements intact.  Cardiovascular:     Rate and Rhythm: Normal rate and regular rhythm.     Pulses: Normal pulses.  Pulmonary:     Effort: Pulmonary effort is normal. No respiratory distress.     Breath sounds: Normal breath sounds. No wheezing or rales.  Abdominal:     General: Bowel sounds are normal. There is distension.     Palpations: Abdomen is soft.     Tenderness: There is abdominal tenderness.     Comments: Left lower quadrant tender to palpation Mild abdominal distention  Musculoskeletal:     Cervical back: Normal range of motion.     Right lower leg: No edema.     Left lower leg: No edema.  Skin:    General: Skin is warm and dry.  Neurological:     General: No focal deficit present.     Mental Status: He is alert and oriented to person, place, and time.     Labs on Admission: I have personally reviewed following labs and imaging studies  CBC: Recent Labs  Lab 04/28/23 1637  WBC 12.0*  HGB 14.5  HCT 41.8  MCV 95.2  PLT 210   Basic  Metabolic Panel: Recent Labs  Lab 04/28/23 1637  NA 137  K 4.3  CL 104  CO2 24  GLUCOSE 99  BUN 20  CREATININE 0.96  CALCIUM 9.1   GFR: Estimated Creatinine Clearance: 85.4 mL/min (by C-G formula based on SCr of 0.96 mg/dL). Liver Function Tests: Recent Labs  Lab 04/28/23 1637  AST 20  ALT 20  ALKPHOS 74  BILITOT 1.0  PROT 7.5  ALBUMIN 4.7   Recent Labs  Lab 04/28/23 1637  LIPASE 27   No results for input(s): "AMMONIA" in the last 168 hours. Coagulation Profile: No results for input(s): "INR", "PROTIME" in the last 168 hours. Cardiac Enzymes: No results for input(s): "CKTOTAL", "CKMB", "CKMBINDEX", "TROPONINI" in the last 168 hours. BNP (last 3 results) No results for input(s): "PROBNP" in the last 8760 hours. HbA1C: No results for input(s): "HGBA1C" in the last 72 hours. CBG: No results for input(s): "GLUCAP" in the last 168 hours. Lipid Profile: No results for input(s): "CHOL", "HDL", "LDLCALC", "TRIG", "CHOLHDL", "LDLDIRECT" in the last 72 hours. Thyroid Function Tests: No results for input(s): "TSH", "T4TOTAL", "FREET4", "T3FREE", "THYROIDAB" in the last 72 hours. Anemia Panel: No results for input(s): "VITAMINB12", "FOLATE", "FERRITIN", "TIBC", "IRON", "RETICCTPCT" in the last 72 hours. Urine analysis:    Component Value Date/Time   COLORURINE YELLOW 04/28/2023 1700   APPEARANCEUR CLEAR 04/28/2023 1700   LABSPEC 1.029 04/28/2023 1700   PHURINE 7.5 04/28/2023 1700   GLUCOSEU NEGATIVE 04/28/2023 1700   HGBUR NEGATIVE 04/28/2023 1700   BILIRUBINUR NEGATIVE 04/28/2023 1700   KETONESUR NEGATIVE 04/28/2023 1700   PROTEINUR TRACE (A) 04/28/2023 1700   UROBILINOGEN >=8.0 03/06/2018 1341   NITRITE NEGATIVE 04/28/2023 1700   LEUKOCYTESUR TRACE (A) 04/28/2023 1700    Radiological Exams on Admission: DG Abd 1 View  Result Date: 04/28/2023 CLINICAL DATA:  Nasogastric tube placement EXAM: ABDOMEN - 1 VIEW COMPARISON:  04/28/2023 CT scan  FINDINGS: Nasogastric  tube tip is in the stomach body with side port in the vicinity of the gastroesophageal junction/gastric cardia. Thoracic spondylosis. Potential mildly dilated loop of small bowel in the right upper abdomen. IMPRESSION: 1. Nasogastric tube tip is in the stomach body with side port in the vicinity of the gastroesophageal junction/gastric cardia. 2. Potential mildly dilated loop of small bowel in the right upper abdomen. Electronically Signed   By: Gaylyn Rong M.D.   On: 04/28/2023 19:29   CT ABDOMEN PELVIS W CONTRAST  Result Date: 04/28/2023 CLINICAL DATA:  Bowel obstruction suspected EXAM: CT ABDOMEN AND PELVIS WITH CONTRAST TECHNIQUE: Multidetector CT imaging of the abdomen and pelvis was performed using the standard protocol following bolus administration of intravenous contrast. RADIATION DOSE REDUCTION: This exam was performed according to the departmental dose-optimization program which includes automated exposure control, adjustment of the mA and/or kV according to patient size and/or use of iterative reconstruction technique. CONTRAST:  85mL OMNIPAQUE IOHEXOL 300 MG/ML  SOLN COMPARISON:  None Available. FINDINGS: Lower chest: Lung bases are clear. Hepatobiliary: No focal hepatic lesion. Normal gallbladder. No biliary duct dilatation. Common bile duct is normal. Pancreas: Pancreas is normal. No ductal dilatation. No pancreatic inflammation. Spleen: Normal spleen Adrenals/urinary tract: Adrenal glands and kidneys are normal. The ureters and bladder normal. Stomach/Bowel: Stomach is gas distended. No fluid. Duodenum is normal. There is a loop of dilated small bowel in the RIGHT upper quadrant folded upon itself measuring 4.3 cm (image 45/2). There is air-fluid level within this loop of bowel. The more distal small bowel is reduced in caliber. Small volume gas and stool throughout the colon. No portal venous gas.  No intraperitoneal free air.  No pneumatosis. Vascular/Lymphatic: Abdominal aorta is  normal caliber. No periportal or retroperitoneal adenopathy. No pelvic adenopathy. Reproductive: Unremarkable Other: No free fluid. Musculoskeletal: No aggressive osseous lesion. IMPRESSION: 1. Dilated loop of small bowel in the RIGHT upper quadrant with air-fluid level. Relatively decompressed distal small bowel. Findings most consistent with partial small bowel obstruction versus early mechanical obstruction. No evidence of high-grade obstruction. No portal venous gas or pneumatosis. 2. Small volume gas and stool throughout the colon. Electronically Signed   By: Genevive Bi M.D.   On: 04/28/2023 18:21    Assessment and Plan  Partial SBO versus early mechanical obstruction Patient with history of multiple hernia repairs in the past with previous small bowel stricture and multiple small bowel obstructions presenting with complaints of left lower quadrant abdominal pain since early morning today associated with nausea, dry heaves, and one episode of diarrhea. CT abdomen pelvis showing findings most consistent with partial small bowel obstruction versus early mechanical obstruction.  Showing no evidence of high-grade obstruction and no portal venous gas or pneumatosis. NG tube placed in the ED.  Keep n.p.o. Continue gentle IV fluid hydration and monitor volume status given history of heart failure.  Continue IV fentanyl as needed for pain and antiemetic as needed (EKG ordered to check QT interval).  Consult general surgery in the morning.  Mild leukocytosis Likely reactive.  No infectious signs or symptoms.  Repeat CBC in a.m.  Chronic HFpEF Echo done in August 2022 showing EF 65 to 70%, trivial MVR.  No signs of volume overload, check BNP.  Hypertension Stable.  IV labetalol PRN.  COPD Stable, no signs of acute exacerbation. Continue home inhalers after pharmacy med rec is done.  GERD IV Protonix 40 mg daily.  BPH Hyperlipidemia N.p.o., no oral meds at this time.  DVT prophylaxis:  SCDs Code Status: DNR/DNI (discussed with the patient) Family Communication: No family available at this time. Level of care: Med-Surg Admission status: It is my clinical opinion that admission to INPATIENT is reasonable and necessary because of the expectation that this patient will require hospital care that crosses at least 2 midnights to treat this condition based on the medical complexity of the problems presented.  Given the aforementioned information, the predictability of an adverse outcome is felt to be significant.   John Giovanni MD [facility]  If 7PM-7AM, please contact night-coverage [email or web address]  04/28/2023, 10:25 PM

## 2023-04-28 NOTE — ED Notes (Signed)
Patient returned from CT

## 2023-04-28 NOTE — ED Triage Notes (Signed)
Patient here POV from Home.  Endorses LLQ ABD Pain for approximately 12 Hours. Began after eating. Some N/V. 1 episode of Diarrhea. History of SBO 1-2 months ago.   NAD noted during triage. A&Ox4. Gcs 15. Ambulatory.

## 2023-04-28 NOTE — ED Notes (Signed)
Spoke with XR to verify NG tube placement

## 2023-04-28 NOTE — ED Provider Notes (Signed)
[place] EMERGENCY DEPARTMENT AT [facility] Provider Note   CSN: [id number] Arrival date & time: 04/28/23  1629     History  Chief Complaint  Patient presents with   Abdominal Pain    Shane Clark is a 72 y.o. male.   Abdominal Pain Patient presents abdominal pain and nausea.  History of recurrent small bowel obstructions after previous hernia repair and then redo surgery.  Had bowel movement earlier today and then later had diarrhea.  Previous small bowel obstructions.  No fevers.  Has had nausea but not frank vomiting.  States it was not as severe as the last 20 had.     Home Medications Prior to Admission medications   Medication Sig Start Date End Date Taking? Authorizing Provider  acetaminophen (TYLENOL) 500 MG tablet Take 1,000 mg by mouth every 6 (six) hours as needed for moderate pain.   Yes [provider]  albuterol (PROAIR HFA) 108 (90 Base) MCG/ACT inhaler Inhale 1-2 puffs into the lungs every 4 (four) hours as needed. Patient taking differently: Inhale 1-2 puffs into the lungs every 6 (six) hours as needed for wheezing or shortness of breath. 01/23/23  Yes   aspirin EC 81 MG tablet Take 81 mg by mouth daily. Swallow whole.   Yes [provider]  azelastine (ASTELIN) 0.1 % nasal spray Place 2 sprays into both nostrils 2 (two) times daily as needed for congestion. 01/06/20  Yes [provider]  budesonide-formoterol (SYMBICORT) 80-4.5 MCG/ACT inhaler Inhale 2 puffs into the lungs 2 (two) times daily. 01/23/23  Yes   diclofenac (VOLTAREN) 75 MG EC tablet Take 1 tablet (75 mg total) by mouth 2 (two) times daily as needed. Do not take until finished with the steroid taper.  Do not take with any other antiinflammatories Patient taking differently: Take 75 mg by mouth 2 (two) times daily as needed for mild pain. 08/13/22  Yes Cristie Hem, PA-C  loratadine (CLARITIN) 10 MG tablet Take 10 mg by mouth daily.   Yes [provider]   Multiple Vitamins-Minerals (ZINC PO) Take 1 tablet by mouth daily.   Yes [provider]  nitroGLYCERIN (NITROSTAT) 0.4 MG SL tablet Place 1 tablet (0.4 mg total) under the tongue as needed. Patient taking differently: Place 0.4 mg under the tongue every 5 (five) minutes as needed for chest pain. 07/16/21  Yes Pricilla Riffle, MD  pantoprazole (PROTONIX) 40 MG tablet Take 1 tablet (40 mg total) by mouth daily for acid reflux. 01/23/23  Yes   rosuvastatin (CRESTOR) 5 MG tablet Take 1 tablet (5 mg total) by mouth daily. 12/18/22  Yes Pricilla Riffle, MD  tamsulosin (FLOMAX) 0.4 MG CAPS capsule Take 1 capsule (0.4 mg total) by mouth daily, 30 minutes after the same meal each day for urinary symptoms 01/23/23  Yes   telmisartan (MICARDIS) 20 MG tablet Take 1 tablet (20 mg total) by mouth daily for blood pressure 01/23/23  Yes   Zinc 100 MG TABS Take 1 tablet by mouth daily.   Yes [provider]  albuterol (VENTOLIN HFA) 108 (90 Base) MCG/ACT inhaler Inhale 1-2 puffs into the lungs every 4 (four) hours as needed. Patient not taking: Reported on 04/28/2023 05/03/21     methocarbamol (ROBAXIN-750) 750 MG tablet Take 1 tablet (750 mg total) by mouth every 8 (eight) hours as needed for muscle spasms. Patient not taking: Reported on 04/28/2023 08/13/22   Cristie Hem, PA-C  predniSONE (STERAPRED UNI-PAK 21 TAB) 10  MG (21) TBPK tablet Take as directed Patient not taking: Reported on 08/24/2022 08/13/22   Cristie Hem, PA-C      Allergies    Codeine, Percocet [oxycodone-acetaminophen], Angiotensin receptor blockers, Crestor [rosuvastatin], Lyrica [pregabalin], Mevacor [lovastatin], Pravachol [pravastatin], Talwin [pentazocine], Zestril [lisinopril], Zetia [ezetimibe], and Advair diskus [fluticasone-salmeterol]    Review of Systems   Review of Systems  Gastrointestinal:  Positive for abdominal pain.    Physical Exam Updated Vital Signs BP 105/73 (BP Location: Right Arm)   Pulse 91   Temp  98.6 F (37 C)   Resp 16   Ht 5\' 8"  (1.727 m)   Wt 111.1 kg   SpO2 96%   BMI 37.25 kg/m  Physical Exam Vitals and nursing note reviewed.  HENT:     Head: Normocephalic.  Cardiovascular:     Rate and Rhythm: Regular rhythm.  Pulmonary:     Breath sounds: Normal breath sounds.  Abdominal:     Tenderness: There is abdominal tenderness.     Comments: Mild diffuse abdominal tenderness without rebound or guarding.  No hernias palpated.  Skin:    Capillary Refill: Capillary refill takes less than 2 seconds.  Neurological:     Mental Status: Shane Clark is alert.     ED Results / Procedures / Treatments   Labs (all labs ordered are listed, but only abnormal results are displayed) Labs Reviewed  CBC - Abnormal; Notable for the following components:      Result Value   WBC 12.0 (*)    All other components within normal limits  URINALYSIS, ROUTINE W REFLEX MICROSCOPIC - Abnormal; Notable for the following components:   Protein, ur TRACE (*)    Leukocytes,Ua TRACE (*)    All other components within normal limits  LIPASE, BLOOD  COMPREHENSIVE METABOLIC PANEL  CBC  BASIC METABOLIC PANEL  BRAIN NATRIURETIC PEPTIDE    EKG None  Radiology DG Abd 1 View  Result Date: 04/28/2023 CLINICAL DATA:  Nasogastric tube placement EXAM: ABDOMEN - 1 VIEW COMPARISON:  04/28/2023 CT scan FINDINGS: Nasogastric tube tip is in the stomach body with side port in the vicinity of the gastroesophageal junction/gastric cardia. Thoracic spondylosis. Potential mildly dilated loop of small bowel in the right upper abdomen. IMPRESSION: 1. Nasogastric tube tip is in the stomach body with side port in the vicinity of the gastroesophageal junction/gastric cardia. 2. Potential mildly dilated loop of small bowel in the right upper abdomen. Electronically Signed   By: Gaylyn Rong M.D.   On: 04/28/2023 19:29   CT ABDOMEN PELVIS W CONTRAST  Result Date: 04/28/2023 CLINICAL DATA:  Bowel obstruction suspected EXAM:  CT ABDOMEN AND PELVIS WITH CONTRAST TECHNIQUE: Multidetector CT imaging of the abdomen and pelvis was performed using the standard protocol following bolus administration of intravenous contrast. RADIATION DOSE REDUCTION: This exam was performed according to the departmental dose-optimization program which includes automated exposure control, adjustment of the mA and/or kV according to patient size and/or use of iterative reconstruction technique. CONTRAST:  85mL OMNIPAQUE IOHEXOL 300 MG/ML  SOLN COMPARISON:  None Available. FINDINGS: Lower chest: Lung bases are clear. Hepatobiliary: No focal hepatic lesion. Normal gallbladder. No biliary duct dilatation. Common bile duct is normal. Pancreas: Pancreas is normal. No ductal dilatation. No pancreatic inflammation. Spleen: Normal spleen Adrenals/urinary tract: Adrenal glands and kidneys are normal. The ureters and bladder normal. Stomach/Bowel: Stomach is gas distended. No fluid. Duodenum is normal. There is a loop of dilated small bowel in the RIGHT upper quadrant folded upon  itself measuring 4.3 cm (image 45/2). There is air-fluid level within this loop of bowel. The more distal small bowel is reduced in caliber. Small volume gas and stool throughout the colon. No portal venous gas.  No intraperitoneal free air.  No pneumatosis. Vascular/Lymphatic: Abdominal aorta is normal caliber. No periportal or retroperitoneal adenopathy. No pelvic adenopathy. Reproductive: Unremarkable Other: No free fluid. Musculoskeletal: No aggressive osseous lesion. IMPRESSION: 1. Dilated loop of small bowel in the RIGHT upper quadrant with air-fluid level. Relatively decompressed distal small bowel. Findings most consistent with partial small bowel obstruction versus early mechanical obstruction. No evidence of high-grade obstruction. No portal venous gas or pneumatosis. 2. Small volume gas and stool throughout the colon. Electronically Signed   By: Genevive Bi M.D.   On: 04/28/2023  18:21    Procedures Procedures    Medications Ordered in ED Medications  fentaNYL (SUBLIMAZE) injection 12.5 mcg (has no administration in time range)  naloxone (NARCAN) injection 0.4 mg (has no administration in time range)  ondansetron (ZOFRAN) injection 4 mg (has no administration in time range)  0.9 %  sodium chloride infusion (has no administration in time range)  labetalol (NORMODYNE) injection 5 mg (has no administration in time range)  pantoprazole (PROTONIX) injection 40 mg (has no administration in time range)  ondansetron (ZOFRAN) injection 4 mg (4 mg Intravenous Given 04/28/23 1654)  sodium chloride 0.9 % bolus 500 mL ( Intravenous Stopped 04/28/23 1741)  iohexol (OMNIPAQUE) 300 MG/ML solution 100 mL (85 mLs Intravenous Contrast Given 04/28/23 1728)  fentaNYL (SUBLIMAZE) injection 50 mcg (50 mcg Intravenous Given 04/28/23 1822)  lidocaine (XYLOCAINE) 2 % jelly 1 Application (1 Application Topical Given 04/28/23 1841)  prochlorperazine (COMPAZINE) injection 5 mg (5 mg Intravenous Given 04/28/23 2113)    ED Course/ Medical Decision Making/ A&P                             Medical Decision Making Amount and/or Complexity of Data Reviewed Labs: ordered. Radiology: ordered.  Risk Prescription drug management. Decision regarding hospitalization.   Patient with abdominal pain.  Nausea.  Previous history of bowel obstructions.  Differential diagnosis includes abdominal pain, constipation, bowel obstruction.  Differential diagnosis probably most likely however the bowel obstruction.  Will get CT scan to evaluate.  Will also get basic blood work and give antiemetics.  Blood work overall reassuring, however CT scan shows small bowel obstruction versus early mechanical obstruction.  Continued nausea.  Has had pain.  I think will require admission to the hospital.  NG tube will be placed.        Final Clinical Impression(s) / ED Diagnoses Final diagnoses:  Small bowel  obstruction [facility])    Rx / DC Orders ED Discharge Orders     None         Benjiman Core, MD 04/28/23 [phone]

## 2023-04-28 NOTE — Progress Notes (Signed)
Patient is transferred from [facility] to 5E at 2155. Alert and oriented x4. NGT is inserted and connected to low suction. No pain complained. Vital signs was taken. Page MD admission at the same time. Waiting for new orders.

## 2023-04-29 ENCOUNTER — Other Ambulatory Visit: Payer: Self-pay

## 2023-04-29 ENCOUNTER — Inpatient Hospital Stay (HOSPITAL_COMMUNITY): Payer: Medicare Other

## 2023-04-29 ENCOUNTER — Other Ambulatory Visit (HOSPITAL_COMMUNITY): Payer: Self-pay

## 2023-04-29 DIAGNOSIS — K56600 Partial intestinal obstruction, unspecified as to cause: Secondary | ICD-10-CM | POA: Diagnosis not present

## 2023-04-29 LAB — CBC
HCT: 38 % — ABNORMAL LOW (ref 39.0–52.0)
Hemoglobin: 12.9 g/dL — ABNORMAL LOW (ref 13.0–17.0)
MCH: 32.9 pg (ref 26.0–34.0)
MCHC: 33.9 g/dL (ref 30.0–36.0)
MCV: 96.9 fL (ref 80.0–100.0)
Platelets: 167 10*3/uL (ref 150–400)
RBC: 3.92 MIL/uL — ABNORMAL LOW (ref 4.22–5.81)
RDW: 13.2 % (ref 11.5–15.5)
WBC: 6.6 10*3/uL (ref 4.0–10.5)
nRBC: 0 % (ref 0.0–0.2)

## 2023-04-29 LAB — BASIC METABOLIC PANEL
Anion gap: 7 (ref 5–15)
BUN: 19 mg/dL (ref 8–23)
CO2: 23 mmol/L (ref 22–32)
Calcium: 8 mg/dL — ABNORMAL LOW (ref 8.9–10.3)
Chloride: 105 mmol/L (ref 98–111)
Creatinine, Ser: 0.95 mg/dL (ref 0.61–1.24)
GFR, Estimated: >60 mL/min (ref >60)
Glucose, Bld: 103 mg/dL — ABNORMAL HIGH (ref 70–99)
Potassium: 3.9 mmol/L (ref 3.5–5.1)
Sodium: 135 mmol/L (ref 135–145)

## 2023-04-29 LAB — BRAIN NATRIURETIC PEPTIDE: B Natriuretic Peptide: 21.3 pg/mL (ref 0.0–100.0)

## 2023-04-29 MED ORDER — DIATRIZOATE MEGLUMINE & SODIUM 66-10 % PO SOLN
90.0000 mL | Freq: Once | ORAL | Status: AC
  Administered 2023-04-29: 90 mL via NASOGASTRIC
  Filled 2023-04-29: qty 90

## 2023-04-29 MED ORDER — MOMETASONE FURO-FORMOTEROL FUM 100-5 MCG/ACT IN AERO
2.0000 | INHALATION_SPRAY | Freq: Two times a day (BID) | RESPIRATORY_TRACT | Status: DC
  Administered 2023-04-29 – 2023-05-01 (×5): 2 via RESPIRATORY_TRACT
  Filled 2023-04-29: qty 8.8

## 2023-04-29 NOTE — Progress Notes (Signed)
PROGRESS NOTE    Shane Clark  ZOX:[id number] DOB: 12-Jan-1951 DOA: 04/28/2023 PCP: [name], MD  Chief Complaint  Patient presents with   Abdominal Pain    Brief Narrative:   Shane Clark is Shane Clark 72 y.o. male with medical history significant of COPD, BPH, GERD, hypertension, hyperlipidemia, chronic HFpEF, history of multiple hernia repairs in the past with previous small bowel stricture and multiple small bowel obstructions presented to ED with complaints of abdominal pain, nausea, and diarrhea.   CT scan concerning for partial SBO vs early mechanical obstruction.    Assessment & Plan:   Principal Problem:   Partial small bowel obstruction (HCC) Active Problems:   COPD (chronic obstructive pulmonary disease) (HCC)   Essential hypertension   Chronic diastolic CHF (congestive heart failure) (HCC)   Mixed hyperlipidemia   BPH (benign prostatic hyperplasia)   GERD (gastroesophageal reflux disease)   Leukocytosis  Partial SBO versus early mechanical obstruction Patient with history of multiple hernia repairs in the past with previous small bowel stricture and multiple small bowel obstructions presenting with complaints of left lower quadrant abdominal pain  CT abdomen pelvis with partial small bowel obstruction versus early mechanical obstruction. S/p NG tube placed in the ED.   Keep n.p.o. for now  Surgery c/s, appreciate recs  Mild leukocytosis resolved   Chronic HFpEF Echo done in August 2022 showing EF 65 to 70%, trivial MVR.  No signs of volume overload.   Hypertension Holding home micardis BP currently on low side? Will monitor off BP meds for now   COPD Resume home   GERD IV Protonix 40 mg daily.   BPH Flomax on hold with NPO status  Hyperlipidemia Crestor on hold with NPO status     DVT prophylaxis: SCD Code Status: DNR/DNI Family Communication: none Disposition:   Status is: Inpatient Remains inpatient appropriate because: need for  continued management of partial SBO   Consultants:  General surgery  Procedures:  none  Antimicrobials:  Anti-infectives (From admission, onward)    None       Subjective: No complaints this morning Passing gas, no BM yet  Objective: Vitals:   04/28/23 2030 04/28/23 2147 04/29/23 0152 04/29/23 0554  BP: (!) 108/58 105/73 (!) 84/67 90/68  Pulse: 88 91 83 78  Resp: 18 16 16 16   Temp:  98.6 F (37 C) 99.6 F (37.6 C) 99.1 F (37.3 C)  TempSrc:      SpO2: 93% 96% 96% 94%  Weight:      Height:        Intake/Output Summary (Last 24 hours) at 04/29/2023 0908 Last data filed at 04/29/2023 0100 Gross per 24 hour  Intake 594.04 ml  Output 275 ml  Net 319.04 ml   Filed Weights   04/28/23 1634  Weight: 111.1 kg    Examination:  General exam: Appears calm and comfortable  Respiratory system: unlabored Cardiovascular system: RRR Gastrointestinal system: Abdomen is nondistended, soft and nontender.  Central nervous system: Alert and oriented. No focal neurological deficits. Extremities: no LEE   Data Reviewed: I have personally reviewed following labs and imaging studies  CBC: Recent Labs  Lab 04/28/23 1637 04/29/23 0536  WBC 12.0* 6.6  HGB 14.5 12.9*  HCT 41.8 38.0*  MCV 95.2 96.9  PLT 210 167    Basic Metabolic Panel: Recent Labs  Lab 04/28/23 1637 04/29/23 0536  NA 137 135  K 4.3 3.9  CL 104 105  CO2 24 23  GLUCOSE 99 103*  BUN 20 19  CREATININE 0.96 0.95  CALCIUM 9.1 8.0*    GFR: Estimated Creatinine Clearance: 86.3 mL/min (by C-G formula based on SCr of 0.95 mg/dL).  Liver Function Tests: Recent Labs  Lab 04/28/23 1637  AST 20  ALT 20  ALKPHOS 74  BILITOT 1.0  PROT 7.5  ALBUMIN 4.7    CBG: No results for input(s): "GLUCAP" in the last 168 hours.   No results found for this or any previous visit (from the past 240 hour(s)).       Radiology Studies: DG Abd 1 View  Result Date: 04/28/2023 CLINICAL DATA:  Nasogastric  tube placement EXAM: ABDOMEN - 1 VIEW COMPARISON:  04/28/2023 CT scan FINDINGS: Nasogastric tube tip is in the stomach body with side port in the vicinity of the gastroesophageal junction/gastric cardia. Thoracic spondylosis. Potential mildly dilated loop of small bowel in the right upper abdomen. IMPRESSION: 1. Nasogastric tube tip is in the stomach body with side port in the vicinity of the gastroesophageal junction/gastric cardia. 2. Potential mildly dilated loop of small bowel in the right upper abdomen. Electronically Signed   By: Gaylyn Rong M.D.   On: 04/28/2023 19:29   CT ABDOMEN PELVIS W CONTRAST  Result Date: 04/28/2023 CLINICAL DATA:  Bowel obstruction suspected EXAM: CT ABDOMEN AND PELVIS WITH CONTRAST TECHNIQUE: Multidetector CT imaging of the abdomen and pelvis was performed using the standard protocol following bolus administration of intravenous contrast. RADIATION DOSE REDUCTION: This exam was performed according to the departmental dose-optimization program which includes automated exposure control, adjustment of the mA and/or kV according to patient size and/or use of iterative reconstruction technique. CONTRAST:  85mL OMNIPAQUE IOHEXOL 300 MG/ML  SOLN COMPARISON:  None Available. FINDINGS: Lower chest: Lung bases are clear. Hepatobiliary: No focal hepatic lesion. Normal gallbladder. No biliary duct dilatation. Common bile duct is normal. Pancreas: Pancreas is normal. No ductal dilatation. No pancreatic inflammation. Spleen: Normal spleen Adrenals/urinary tract: Adrenal glands and kidneys are normal. The ureters and bladder normal. Stomach/Bowel: Stomach is gas distended. No fluid. Duodenum is normal. There is Hebah Bogosian loop of dilated small bowel in the RIGHT upper quadrant folded upon itself measuring 4.3 cm (image 45/2). There is air-fluid level within this loop of bowel. The more distal small bowel is reduced in caliber. Small volume gas and stool throughout the colon. No portal venous gas.   No intraperitoneal free air.  No pneumatosis. Vascular/Lymphatic: Abdominal aorta is normal caliber. No periportal or retroperitoneal adenopathy. No pelvic adenopathy. Reproductive: Unremarkable Other: No free fluid. Musculoskeletal: No aggressive osseous lesion. IMPRESSION: 1. Dilated loop of small bowel in the RIGHT upper quadrant with air-fluid level. Relatively decompressed distal small bowel. Findings most consistent with partial small bowel obstruction versus early mechanical obstruction. No evidence of high-grade obstruction. No portal venous gas or pneumatosis. 2. Small volume gas and stool throughout the colon. Electronically Signed   By: Genevive Bi M.D.   On: 04/28/2023 18:21        Scheduled Meds:  pantoprazole (PROTONIX) IV  40 mg Intravenous Q24H   Continuous Infusions:  sodium chloride 75 mL/hr at 04/28/23 2345     LOS: 1 day    Time spent: over 30 min    Lacretia Nicks, MD [facility]   To contact the attending provider between 7A-7P or the covering provider during after hours 7P-7A, please log into the web site [email or web address] and access using universal [place] password for that web site. If you do not have the password, please  call the hospital operator.  04/29/2023, 9:08 AM

## 2023-04-29 NOTE — Plan of Care (Signed)

## 2023-04-29 NOTE — Consult Note (Signed)
Reason for Consult: Small bowel obstruction Referring Physician: Lowell Guitar MD  Shane Clark is an 72 y.o. male.  HPI: Asked see patient at the request of Dr. Allena Katz for small bowel obstruction.  History of previous ventral hernia repair with mesh in the past as well as small bowel obstruction 2010.  Patient describes 1 day history of sharp diffuse abdominal pain.  He had a bowel movement with some diarrhea yesterday.  He has had nausea vomiting.  None since admission.  Abdominal pain is improved.  Pain was moderate to severe intensity diffuse lower abdomen without radiation.  No fever or chills.  CT scan shows what appears to be a low-grade small bowel obstruction.  No signs of recurrent hernia or other complicating feature.  Past Medical History:  Diagnosis Date   Allergy    Arthritis    Asthma    BPH (benign prostatic hyperplasia)    Chronic back pain    "all over"   COPD (chronic obstructive pulmonary disease) (HCC)    GERD (gastroesophageal reflux disease)    Hx of adenomatous colonic polyps 03/22/2016   Hypertension    Influenza 11/20/2011   Sepsis (HCC) 02/05/2016    Past Surgical History:  Procedure Laterality Date   ABDOMINAL HERNIA REPAIR     ANTERIOR CERVICAL DECOMP/DISCECTOMY FUSION     C6-8   APPENDECTOMY  1960s   BACK SURGERY     COLONOSCOPY     HERNIA REPAIR     INSERTION DISTRACTION DEVICE LUMBAR POSTERIOR SPINOUS PROCESS     KNEE ARTHROSCOPY Left    SHOULDER SURGERY Left 02/11/2017   SMALL BOWEL REPAIR      Family History  Problem Relation Age of Onset   Coronary artery disease Father    Pancreatic cancer Brother    Stroke Brother    Heart disease Brother    Dementia Mother    Diabetes type II Other    Colon cancer Neg Hx    Colon polyps Neg Hx    Rectal cancer Neg Hx    Stomach cancer Neg Hx     Social History:  reports that he has quit smoking. His smoking use included cigarettes. He has a 2.40 pack-year smoking history. He has quit using smokeless  tobacco. He reports that he does not drink alcohol and does not use drugs.  Allergies:  Allergies  Allergen Reactions   Codeine Shortness Of Breath   Percocet [Oxycodone-Acetaminophen] Anaphylaxis   Angiotensin Receptor Blockers Other (See Comments)    Dyspnea   Crestor [Rosuvastatin]     Other reaction(s): Feet Hurt   Lyrica [Pregabalin] Other (See Comments)    Headache   Mevacor [Lovastatin]     Joint Pain   Pravachol [Pravastatin] Other (See Comments)   Talwin [Pentazocine] Nausea Only and Other (See Comments)    Stopped breathing   Zestril [Lisinopril] Other (See Comments)    Dyspnea   Zetia [Ezetimibe] Itching   Advair Diskus [Fluticasone-Salmeterol] Rash    unknown    Medications: I have reviewed the patient's current medications.  Results for orders placed or performed during the hospital encounter of 04/28/23 (from the past 48 hour(s))  Lipase, blood     Status: None   Collection Time: 04/28/23  4:37 PM  Result Value Ref Range   Lipase 27 11 - 51 U/L    Comment: Performed at [facility], [address], [place], [place] [address]  Comprehensive metabolic panel     Status: None   Collection  Time: 04/28/23  4:37 PM  Result Value Ref Range   Sodium 137 135 - 145 mmol/L   Potassium 4.3 3.5 - 5.1 mmol/L   Chloride 104 98 - 111 mmol/L   CO2 24 22 - 32 mmol/L   Glucose, Bld 99 70 - 99 mg/dL    Comment: Glucose reference range applies only to samples taken after fasting for at least 8 hours.   BUN 20 8 - 23 mg/dL   Creatinine, Ser 1.61 0.61 - 1.24 mg/dL   Calcium 9.1 8.9 - 09.6 mg/dL   Total Protein 7.5 6.5 - 8.1 g/dL   Albumin 4.7 3.5 - 5.0 g/dL   AST 20 15 - 41 U/L   ALT 20 0 - 44 U/L   Alkaline Phosphatase 74 38 - 126 U/L   Total Bilirubin 1.0 0.3 - 1.2 mg/dL   GFR, Estimated >04 >54 mL/min    Comment: (NOTE) Calculated using the CKD-EPI Creatinine Equation (2021)    Anion gap 9 5 - 15    Comment: Performed at [facility], [address] [place], [place] [address]  CBC     Status: Abnormal   Collection Time: 04/28/23  4:37 PM  Result Value Ref Range   WBC 12.0 (H) 4.0 - 10.5 K/uL   RBC 4.39 4.22 - 5.81 MIL/uL   Hemoglobin 14.5 13.0 - 17.0 g/dL   HCT 91.4 78.2 - 95.6 %   MCV 95.2 80.0 - 100.0 fL   MCH 33.0 26.0 - 34.0 pg   MCHC 34.7 30.0 - 36.0 g/dL   RDW 21.3 08.6 - 57.8 %   Platelets 210 150 - 400 K/uL   nRBC 0.0 0.0 - 0.2 %    Comment: Performed at [facility], [address] [place], [place] [address]  Urinalysis, Routine w reflex microscopic -Urine, Clean Catch     Status: Abnormal   Collection Time: 04/28/23  5:00 PM  Result Value Ref Range   Color, Urine YELLOW YELLOW   APPearance CLEAR CLEAR   Specific Gravity, Urine 1.029 1.005 - 1.030   pH 7.5 5.0 - 8.0   Glucose, UA NEGATIVE NEGATIVE mg/dL   Hgb urine dipstick NEGATIVE NEGATIVE   Bilirubin Urine NEGATIVE NEGATIVE   Ketones, ur NEGATIVE NEGATIVE mg/dL   Protein, ur TRACE (A) NEGATIVE mg/dL   Nitrite NEGATIVE NEGATIVE   Leukocytes,Ua TRACE (A) NEGATIVE   RBC / HPF 0-5 0 - 5 RBC/hpf   WBC, UA 6-10 0 - 5 WBC/hpf   Bacteria, UA NONE SEEN NONE SEEN   Squamous Epithelial / HPF 0-5 0 - 5 /HPF   Mucus PRESENT     Comment: Performed at [facility], [address] [place], [place], [place] [address]  CBC     Status: Abnormal   Collection Time: 04/29/23  5:36 AM  Result Value Ref Range   WBC 6.6 4.0 - 10.5 K/uL   RBC 3.92 (L) 4.22 - 5.81 MIL/uL   Hemoglobin 12.9 (L) 13.0 - 17.0 g/dL   HCT 13.2 (L) 44.0 - 10.2 %   MCV 96.9 80.0 - 100.0 fL   MCH 32.9 26.0 - 34.0 pg   MCHC 33.9 30.0 - 36.0 g/dL   RDW 72.5 36.6 - 44.0 %   Platelets 167 150 - 400 K/uL   nRBC 0.0 0.0 - 0.2 %    Comment: Performed at [facility], [address], [place], [place] [address]  Basic metabolic panel     Status: Abnormal  Collection Time: 04/29/23  5:36 AM  Result Value Ref Range   Sodium 135  135 - 145 mmol/L   Potassium 3.9 3.5 - 5.1 mmol/L   Chloride 105 98 - 111 mmol/L   CO2 23 22 - 32 mmol/L   Glucose, Bld 103 (H) 70 - 99 mg/dL    Comment: Glucose reference range applies only to samples taken after fasting for at least 8 hours.   BUN 19 8 - 23 mg/dL   Creatinine, Ser 1.61 0.61 - 1.24 mg/dL   Calcium 8.0 (L) 8.9 - 10.3 mg/dL   GFR, Estimated >09 >60 mL/min    Comment: (NOTE) Calculated using the CKD-EPI Creatinine Equation (2021)    Anion gap 7 5 - 15    Comment: Performed at [facility], [address], [place], [place] [address]  Brain natriuretic peptide     Status: None   Collection Time: 04/29/23  5:36 AM  Result Value Ref Range   B Natriuretic Peptide 21.3 0.0 - 100.0 pg/mL    Comment: Performed at [facility], [address] [place], [place] [address]    DG Abd 1 View  Result Date: 04/28/2023 CLINICAL DATA:  Nasogastric tube placement EXAM: ABDOMEN - 1 VIEW COMPARISON:  04/28/2023 CT scan FINDINGS: Nasogastric tube tip is in the stomach body with side port in the vicinity of the gastroesophageal junction/gastric cardia. Thoracic spondylosis. Potential mildly dilated loop of small bowel in the right upper abdomen. IMPRESSION: 1. Nasogastric tube tip is in the stomach body with side port in the vicinity of the gastroesophageal junction/gastric cardia. 2. Potential mildly dilated loop of small bowel in the right upper abdomen. Electronically Signed   By: Gaylyn Rong M.D.   On: 04/28/2023 19:29   CT ABDOMEN PELVIS W CONTRAST  Result Date: 04/28/2023 CLINICAL DATA:  Bowel obstruction suspected EXAM: CT ABDOMEN AND PELVIS WITH CONTRAST TECHNIQUE: Multidetector CT imaging of the abdomen and pelvis was performed using the standard protocol following bolus administration of intravenous contrast. RADIATION DOSE REDUCTION: This exam was performed according to the departmental dose-optimization program which includes automated  exposure control, adjustment of the mA and/or kV according to patient size and/or use of iterative reconstruction technique. CONTRAST:  85mL OMNIPAQUE IOHEXOL 300 MG/ML  SOLN COMPARISON:  None Available. FINDINGS: Lower chest: Lung bases are clear. Hepatobiliary: No focal hepatic lesion. Normal gallbladder. No biliary duct dilatation. Common bile duct is normal. Pancreas: Pancreas is normal. No ductal dilatation. No pancreatic inflammation. Spleen: Normal spleen Adrenals/urinary tract: Adrenal glands and kidneys are normal. The ureters and bladder normal. Stomach/Bowel: Stomach is gas distended. No fluid. Duodenum is normal. There is a loop of dilated small bowel in the RIGHT upper quadrant folded upon itself measuring 4.3 cm (image 45/2). There is air-fluid level within this loop of bowel. The more distal small bowel is reduced in caliber. Small volume gas and stool throughout the colon. No portal venous gas.  No intraperitoneal free air.  No pneumatosis. Vascular/Lymphatic: Abdominal aorta is normal caliber. No periportal or retroperitoneal adenopathy. No pelvic adenopathy. Reproductive: Unremarkable Other: No free fluid. Musculoskeletal: No aggressive osseous lesion. IMPRESSION: 1. Dilated loop of small bowel in the RIGHT upper quadrant with air-fluid level. Relatively decompressed distal small bowel. Findings most consistent with partial small bowel obstruction versus early mechanical obstruction. No evidence of high-grade obstruction. No portal venous gas or pneumatosis. 2. Small volume gas and stool throughout the colon. Electronically Signed   By: Loura Halt.D.  On: 04/28/2023 18:21    Review of Systems  Gastrointestinal:  Positive for abdominal distention, abdominal pain, diarrhea and nausea.  All other systems reviewed and are negative.  Blood pressure 97/66, pulse 70, temperature 98.5 F (36.9 C), resp. rate 18, height 5\' 8"  (1.727 m), weight 111.1 kg, SpO2 93 %. Physical Exam HENT:      Head: Normocephalic.  Eyes:     Pupils: Pupils are equal, round, and reactive to light.  Cardiovascular:     Rate and Rhythm: Normal rate.  Abdominal:     General: Abdomen is flat and protuberant. There is distension.     Palpations: Abdomen is soft.     Tenderness: There is no abdominal tenderness.       Comments: Scar  Skin:    General: Skin is warm.  Neurological:     General: No focal deficit present.     Mental Status: He is alert.  Psychiatric:        Mood and Affect: Mood normal.        Behavior: Behavior normal.     Assessment/Plan: Partial small bowel obstruction  History of ventral hernia pair with mesh  History of small bowel obstruction  Small bowel protocol  IV fluids  Check KUB in a.m.  N.p.o. except ice chips  Surgery to follow  Moderate complexity  Delaney Schnick A Dejana Pugsley 04/29/2023, 10:58 AM

## 2023-04-29 NOTE — Progress Notes (Signed)
Transition of Care [facility]) - Inpatient Brief Assessment   Patient Details  Name: Shane Clark MRN: [id number] Date of Birth: [birth year]  [facility] of Care [facility] CM/SW Contact:    Otelia Santee, LCSW Phone Number: 04/29/2023, 2:45 PM   Transition of Care Asessment: Insurance and Status: Insurance coverage has been reviewed Patient has primary care physician: Yes Home environment has been reviewed: Single Family Home w/ Wife Prior level of function:: Independent Prior/Current Home Services: No current home services Social Determinants of Health Reivew: SDOH reviewed no interventions necessary Readmission risk has been reviewed: Yes Transition of care needs: no transition of care needs at this time

## 2023-04-29 NOTE — Progress Notes (Signed)
Mobility Specialist - Progress Note   04/29/23 0953  Mobility  Activity Ambulated with assistance in hallway  Level of Assistance Standby assist, set-up cues, supervision of patient - no hands on  Assistive Device None  Distance Ambulated (ft) 500 ft  Range of Motion/Exercises Active  Activity Response Tolerated well  Mobility Referral Yes  $Mobility charge 1 Mobility  Mobility Specialist Start Time (ACUTE ONLY) 0945  Mobility Specialist Stop Time (ACUTE ONLY) 0953  Mobility Specialist Time Calculation (min) (ACUTE ONLY) 8 min   Pt received in chair and agreed to mobility. Pt did no need any assistance besides IV and gown, pt returned to chair with all needs met.  Marilynne Halsted Mobility Specialist

## 2023-04-30 DIAGNOSIS — K56600 Partial intestinal obstruction, unspecified as to cause: Secondary | ICD-10-CM | POA: Diagnosis not present

## 2023-04-30 LAB — COMPREHENSIVE METABOLIC PANEL
ALT: 20 U/L (ref 0–44)
AST: 21 U/L (ref 15–41)
Albumin: 3.6 g/dL (ref 3.5–5.0)
Alkaline Phosphatase: 63 U/L (ref 38–126)
Anion gap: 9 (ref 5–15)
BUN: 21 mg/dL (ref 8–23)
CO2: 25 mmol/L (ref 22–32)
Calcium: 8.2 mg/dL — ABNORMAL LOW (ref 8.9–10.3)
Chloride: 104 mmol/L (ref 98–111)
Creatinine, Ser: 1.09 mg/dL (ref 0.61–1.24)
GFR, Estimated: >60 mL/min (ref >60)
Glucose, Bld: 93 mg/dL (ref 70–99)
Potassium: 3.8 mmol/L (ref 3.5–5.1)
Sodium: 138 mmol/L (ref 135–145)
Total Bilirubin: 1.1 mg/dL (ref 0.3–1.2)
Total Protein: 7 g/dL (ref 6.5–8.1)

## 2023-04-30 LAB — CBC WITH DIFFERENTIAL/PLATELET
Abs Immature Granulocytes: 0.02 10*3/uL (ref 0.00–0.07)
Basophils Absolute: 0 10*3/uL (ref 0.0–0.1)
Basophils Relative: 0 %
Eosinophils Absolute: 0.1 10*3/uL (ref 0.0–0.5)
Eosinophils Relative: 2 %
HCT: 38.7 % — ABNORMAL LOW (ref 39.0–52.0)
Hemoglobin: 12.7 g/dL — ABNORMAL LOW (ref 13.0–17.0)
Immature Granulocytes: 0 %
Lymphocytes Relative: 15 %
Lymphs Abs: 1.2 10*3/uL (ref 0.7–4.0)
MCH: 32.5 pg (ref 26.0–34.0)
MCHC: 32.8 g/dL (ref 30.0–36.0)
MCV: 99 fL (ref 80.0–100.0)
Monocytes Absolute: 1.1 10*3/uL — ABNORMAL HIGH (ref 0.1–1.0)
Monocytes Relative: 14 %
Neutro Abs: 5.4 10*3/uL (ref 1.7–7.7)
Neutrophils Relative %: 69 %
Platelets: 167 10*3/uL (ref 150–400)
RBC: 3.91 MIL/uL — ABNORMAL LOW (ref 4.22–5.81)
RDW: 13.2 % (ref 11.5–15.5)
WBC: 7.9 10*3/uL (ref 4.0–10.5)
nRBC: 0 % (ref 0.0–0.2)

## 2023-04-30 LAB — PHOSPHORUS: Phosphorus: 2.5 mg/dL (ref 2.5–4.6)

## 2023-04-30 LAB — MAGNESIUM: Magnesium: 1.9 mg/dL (ref 1.7–2.4)

## 2023-04-30 MED ORDER — PANTOPRAZOLE SODIUM 40 MG IV SOLR
40.0000 mg | Freq: Every day | INTRAVENOUS | Status: DC
  Administered 2023-04-30: 40 mg via INTRAVENOUS

## 2023-04-30 NOTE — Progress Notes (Signed)
Subjective/Chief Complaint: Having BMs No pain  Films show contrast in the colon    Objective: Vital signs in last 24 hours: Temp:  [98.4 F (36.9 C)-98.9 F (37.2 C)] 98.4 F (36.9 C) (05/30 0459) Pulse Rate:  [70-79] 79 (05/30 0459) Resp:  [16-18] 16 (05/30 0459) BP: (97-126)/(66-74) 111/73 (05/30 0459) SpO2:  [93 %-98 %] 95 % (05/30 0459) Last BM Date : 04/29/23  Intake/Output from previous day: 05/29 0701 - 05/30 0700 In: 30 [NG/GT:30] Out: 400 [Urine:400] Intake/Output this shift: No intake/output data recorded.  Soft NT ND   Lab Results:  Recent Labs    04/29/23 0536 04/30/23 0555  WBC 6.6 7.9  HGB 12.9* 12.7*  HCT 38.0* 38.7*  PLT 167 167   BMET Recent Labs    04/29/23 0536 04/30/23 0555  NA 135 138  K 3.9 3.8  CL 105 104  CO2 23 25  GLUCOSE 103* 93  BUN 19 21  CREATININE 0.95 1.09  CALCIUM 8.0* 8.2*   PT/INR No results for input(s): "LABPROT", "INR" in the last 72 hours. ABG No results for input(s): "PHART", "HCO3" in the last 72 hours.  Invalid input(s): "PCO2", "PO2"  Studies/Results: DG Abd Portable 1V-Small Bowel Obstruction Protocol-initial, 8 hr delay  Result Date: 04/29/2023 CLINICAL DATA:  8 hour small-bowel follow up EXAM: PORTABLE ABDOMEN - 1 VIEW COMPARISON:  Film from the previous day. FINDINGS: Scattered large and small bowel gas is noted. Previously administered contrast now lies entirely within the colon. IMPRESSION: Contrast administered lies in the colon consistent with AA partial small bowel obstruction. No small bowel dilatation is seen at this time. Electronically Signed   By: Alcide Clever M.D.   On: 04/29/2023 22:50   DG Abd 1 View  Result Date: 04/28/2023 CLINICAL DATA:  Nasogastric tube placement EXAM: ABDOMEN - 1 VIEW COMPARISON:  04/28/2023 CT scan FINDINGS: Nasogastric tube tip is in the stomach body with side port in the vicinity of the gastroesophageal junction/gastric cardia. Thoracic spondylosis. Potential  mildly dilated loop of small bowel in the right upper abdomen. IMPRESSION: 1. Nasogastric tube tip is in the stomach body with side port in the vicinity of the gastroesophageal junction/gastric cardia. 2. Potential mildly dilated loop of small bowel in the right upper abdomen. Electronically Signed   By: Gaylyn Rong M.D.   On: 04/28/2023 19:29   CT ABDOMEN PELVIS W CONTRAST  Result Date: 04/28/2023 CLINICAL DATA:  Bowel obstruction suspected EXAM: CT ABDOMEN AND PELVIS WITH CONTRAST TECHNIQUE: Multidetector CT imaging of the abdomen and pelvis was performed using the standard protocol following bolus administration of intravenous contrast. RADIATION DOSE REDUCTION: This exam was performed according to the departmental dose-optimization program which includes automated exposure control, adjustment of the mA and/or kV according to patient size and/or use of iterative reconstruction technique. CONTRAST:  85mL OMNIPAQUE IOHEXOL 300 MG/ML  SOLN COMPARISON:  None Available. FINDINGS: Lower chest: Lung bases are clear. Hepatobiliary: No focal hepatic lesion. Normal gallbladder. No biliary duct dilatation. Common bile duct is normal. Pancreas: Pancreas is normal. No ductal dilatation. No pancreatic inflammation. Spleen: Normal spleen Adrenals/urinary tract: Adrenal glands and kidneys are normal. The ureters and bladder normal. Stomach/Bowel: Stomach is gas distended. No fluid. Duodenum is normal. There is a loop of dilated small bowel in the RIGHT upper quadrant folded upon itself measuring 4.3 cm (image 45/2). There is air-fluid level within this loop of bowel. The more distal small bowel is reduced in caliber. Small volume gas and stool throughout  the colon. No portal venous gas.  No intraperitoneal free air.  No pneumatosis. Vascular/Lymphatic: Abdominal aorta is normal caliber. No periportal or retroperitoneal adenopathy. No pelvic adenopathy. Reproductive: Unremarkable Other: No free fluid. Musculoskeletal:  No aggressive osseous lesion. IMPRESSION: 1. Dilated loop of small bowel in the RIGHT upper quadrant with air-fluid level. Relatively decompressed distal small bowel. Findings most consistent with partial small bowel obstruction versus early mechanical obstruction. No evidence of high-grade obstruction. No portal venous gas or pneumatosis. 2. Small volume gas and stool throughout the colon. Electronically Signed   By: Genevive Bi M.D.   On: 04/28/2023 18:21    Anti-infectives: Anti-infectives (From admission, onward)    None       Assessment/Plan: PSBO- resolving  DC NGT Adv diet If tolerated diet, he can be discharges      LOS: 2 days    Maisie Fus A Jaclynne Baldo 04/30/2023 Moderate complexity

## 2023-04-30 NOTE — Progress Notes (Signed)
PROGRESS NOTE    Shane Clark  ZOX:[id number] DOB: [birth year] DOA: 04/28/2023 PCP: [name], MD  Chief Complaint  Patient presents with   Abdominal Pain    Brief Narrative:   Shane Clark is Shane Clark 72 y.o. male with medical history significant of COPD, BPH, GERD, hypertension, hyperlipidemia, chronic HFpEF, history of multiple hernia repairs in the past with previous small bowel stricture and multiple small bowel obstructions presented to ED with complaints of abdominal pain, nausea, and diarrhea.   CT scan concerning for partial SBO vs early mechanical obstruction.    Assessment & Plan:   Principal Problem:   Partial small bowel obstruction (HCC) Active Problems:   COPD (chronic obstructive pulmonary disease) (HCC)   Essential hypertension   Chronic diastolic CHF (congestive heart failure) (HCC)   Mixed hyperlipidemia   BPH (benign prostatic hyperplasia)   GERD (gastroesophageal reflux disease)   Leukocytosis  Partial SBO versus early mechanical obstruction Patient with history of multiple hernia repairs in the past with previous small bowel stricture and multiple small bowel obstructions presenting with complaints of left lower quadrant abdominal pain  CT abdomen pelvis with partial small bowel obstruction versus early mechanical obstruction. Surgery c/s, appreciate recs -> ADAT, possible d/c if tolerates  Mild leukocytosis resolved   Chronic HFpEF Echo done in August 2022 showing EF 65 to 70%, trivial MVR.  No signs of volume overload.   Hypertension Holding home micardis BP currently on low side? Will monitor off BP meds for now   COPD Resume home   GERD IV Protonix 40 mg daily.   BPH Flomax on hold with NPO status  Hyperlipidemia Crestor on hold with NPO status     DVT prophylaxis: SCD Code Status: DNR/DNI Family Communication: none Disposition:   Status is: Inpatient Remains inpatient appropriate because: need for continued management of  partial SBO   Consultants:  General surgery  Procedures:  none  Antimicrobials:  Anti-infectives (From admission, onward)    None       Subjective: Passing gas, BM's  Objective: Vitals:   04/29/23 1932 04/30/23 0459 04/30/23 0839 04/30/23 1324  BP: 110/74 111/73  109/70  Pulse: 70 79  66  Resp: 16 16  18   Temp: 98.9 F (37.2 C) 98.4 F (36.9 C)  97.7 F (36.5 C)  TempSrc: Oral Oral  Oral  SpO2: 96% 95% 96% 98%  Weight:      Height:        Intake/Output Summary (Last 24 hours) at 04/30/2023 1403 Last data filed at 04/30/2023 0600 Gross per 24 hour  Intake --  Output 0 ml  Net 0 ml   Filed Weights   04/28/23 1634  Weight: 111.1 kg    Examination:  General: No acute distress. Cardiovascular: RRR Lungs: unlabored Abdomen: distended, nontender Neurological: Alert and oriented 3. Moves all extremities 4 with equal strength. Cranial nerves II through XII grossly intact. Extremities: No clubbing or cyanosis. No edema.  Data Reviewed: I have personally reviewed following labs and imaging studies  CBC: Recent Labs  Lab 04/28/23 1637 04/29/23 0536 04/30/23 0555  WBC 12.0* 6.6 7.9  NEUTROABS  --   --  5.4  HGB 14.5 12.9* 12.7*  HCT 41.8 38.0* 38.7*  MCV 95.2 96.9 99.0  PLT 210 167 167    Basic Metabolic Panel: Recent Labs  Lab 04/28/23 1637 04/29/23 0536 04/30/23 0555  NA 137 135 138  K 4.3 3.9 3.8  CL 104 105 104  CO2 24 23  25  GLUCOSE 99 103* 93  BUN 20 19 21   CREATININE 0.96 0.95 1.09  CALCIUM 9.1 8.0* 8.2*  MG  --   --  1.9  PHOS  --   --  2.5    GFR: Estimated Creatinine Clearance: 75.2 mL/min (by C-G formula based on SCr of 1.09 mg/dL).  Liver Function Tests: Recent Labs  Lab 04/28/23 1637 04/30/23 0555  AST 20 21  ALT 20 20  ALKPHOS 74 63  BILITOT 1.0 1.1  PROT 7.5 7.0  ALBUMIN 4.7 3.6    CBG: No results for input(s): "GLUCAP" in the last 168 hours.   No results found for this or any previous visit (from the past  240 hour(s)).       Radiology Studies: DG Abd Portable 1V-Small Bowel Obstruction Protocol-initial, 8 hr delay  Result Date: 04/29/2023 CLINICAL DATA:  8 hour small-bowel follow up EXAM: PORTABLE ABDOMEN - 1 VIEW COMPARISON:  Film from the previous day. FINDINGS: Scattered large and small bowel gas is noted. Previously administered contrast now lies entirely within the colon. IMPRESSION: Contrast administered lies in the colon consistent with AA partial small bowel obstruction. No small bowel dilatation is seen at this time. Electronically Signed   By: Alcide Clever M.D.   On: 04/29/2023 22:50   DG Abd 1 View  Result Date: 04/28/2023 CLINICAL DATA:  Nasogastric tube placement EXAM: ABDOMEN - 1 VIEW COMPARISON:  04/28/2023 CT scan FINDINGS: Nasogastric tube tip is in the stomach body with side port in the vicinity of the gastroesophageal junction/gastric cardia. Thoracic spondylosis. Potential mildly dilated loop of small bowel in the right upper abdomen. IMPRESSION: 1. Nasogastric tube tip is in the stomach body with side port in the vicinity of the gastroesophageal junction/gastric cardia. 2. Potential mildly dilated loop of small bowel in the right upper abdomen. Electronically Signed   By: Gaylyn Rong M.D.   On: 04/28/2023 19:29   CT ABDOMEN PELVIS W CONTRAST  Result Date: 04/28/2023 CLINICAL DATA:  Bowel obstruction suspected EXAM: CT ABDOMEN AND PELVIS WITH CONTRAST TECHNIQUE: Multidetector CT imaging of the abdomen and pelvis was performed using the standard protocol following bolus administration of intravenous contrast. RADIATION DOSE REDUCTION: This exam was performed according to the departmental dose-optimization program which includes automated exposure control, adjustment of the mA and/or kV according to patient size and/or use of iterative reconstruction technique. CONTRAST:  85mL OMNIPAQUE IOHEXOL 300 MG/ML  SOLN COMPARISON:  None Available. FINDINGS: Lower chest: Lung bases are  clear. Hepatobiliary: No focal hepatic lesion. Normal gallbladder. No biliary duct dilatation. Common bile duct is normal. Pancreas: Pancreas is normal. No ductal dilatation. No pancreatic inflammation. Spleen: Normal spleen Adrenals/urinary tract: Adrenal glands and kidneys are normal. The ureters and bladder normal. Stomach/Bowel: Stomach is gas distended. No fluid. Duodenum is normal. There is Gregoria Selvy loop of dilated small bowel in the RIGHT upper quadrant folded upon itself measuring 4.3 cm (image 45/2). There is air-fluid level within this loop of bowel. The more distal small bowel is reduced in caliber. Small volume gas and stool throughout the colon. No portal venous gas.  No intraperitoneal free air.  No pneumatosis. Vascular/Lymphatic: Abdominal aorta is normal caliber. No periportal or retroperitoneal adenopathy. No pelvic adenopathy. Reproductive: Unremarkable Other: No free fluid. Musculoskeletal: No aggressive osseous lesion. IMPRESSION: 1. Dilated loop of small bowel in the RIGHT upper quadrant with air-fluid level. Relatively decompressed distal small bowel. Findings most consistent with partial small bowel obstruction versus early mechanical obstruction. No evidence  of high-grade obstruction. No portal venous gas or pneumatosis. 2. Small volume gas and stool throughout the colon. Electronically Signed   By: Genevive Bi M.D.   On: 04/28/2023 18:21        Scheduled Meds:  mometasone-formoterol  2 puff Inhalation BID   pantoprazole (PROTONIX) IV  40 mg Intravenous Q24H   Continuous Infusions:     LOS: 2 days    Time spent: over 30 min    Lacretia Nicks, MD [facility]   To contact the attending provider between 7A-7P or the covering provider during after hours 7P-7A, please log into the web site [email or web address] and access using universal [facility] password for that web site. If you do not have the password, please call the hospital operator.  04/30/2023, 2:03 PM

## 2023-05-01 ENCOUNTER — Other Ambulatory Visit (HOSPITAL_COMMUNITY): Payer: Self-pay

## 2023-05-01 DIAGNOSIS — K56600 Partial intestinal obstruction, unspecified as to cause: Secondary | ICD-10-CM | POA: Diagnosis not present

## 2023-05-01 NOTE — Plan of Care (Signed)

## 2023-05-01 NOTE — Discharge Summary (Signed)
Physician Discharge Summary  DEMORRIO Clark ZOX:[id number] DOB: 05-Nov-1951 DOA: 04/28/2023  PCP: Shane Heys, MD  Admit date: 04/28/2023 Discharge date: 05/01/2023  Time spent: 40 minutes  Recommendations for Outpatient Follow-up:  Follow outpatient CBC/CMP  Follow diet per surgery Follow blood pressure outpatient, ok here while arb was held - consider long term plan Follow sore to R nare outpatient    Discharge Diagnoses:  Principal Problem:   Partial small bowel obstruction (HCC) Active Problems:   COPD (chronic obstructive pulmonary disease) (HCC)   Essential hypertension   Chronic diastolic CHF (congestive heart failure) (HCC)   Mixed hyperlipidemia   BPH (benign prostatic hyperplasia)   GERD (gastroesophageal reflux disease)   Leukocytosis   Discharge Condition: stable  Diet recommendation: heart healthy  Filed Weights   04/28/23 1634  Weight: 111.1 kg    History of present illness:   Shane Clark is Shane Clark 72 y.o. male with medical history significant of COPD, BPH, GERD, hypertension, hyperlipidemia, chronic HFpEF, history of multiple hernia repairs in the past with previous small bowel stricture and multiple small bowel obstructions presented to ED with complaints of abdominal pain, nausea, and diarrhea.    CT scan concerning for partial SBO vs early mechanical obstruction.    S/p NG tube, conservative management.   Tolerating diet, stable for discharge from surgery standpoint.  See below for additional details  Hospital Course:  Assessment and Plan:  Partial SBO versus early mechanical obstruction Patient with history of multiple hernia repairs in the past with previous small bowel stricture and multiple small bowel obstructions presenting with complaints of left lower quadrant abdominal pain  CT abdomen pelvis with partial small bowel obstruction versus early mechanical obstruction. Surgery c/s, appreciate recs -> stable for discharge   R nare  wound Related to NGT Discussed wound care, precautions (can use vaseline to this)  Mild leukocytosis resolved   Chronic HFpEF Echo done in August 2022 showing EF 65 to 70%, trivial MVR.  No signs of volume overload.   Hypertension Resume micardis - discuss long term BP med plan with PCP as blood pressures here ok   COPD Continue home meds   GERD PPI   BPH Flomax   Hyperlipidemia Resume crestor   Obesity Body mass index is 37.25 kg/m.    Procedures: none   Consultations: surgery  Discharge Exam: Vitals:   05/01/23 0520 05/01/23 0741  BP: 132/72   Pulse: 68   Resp: 18   Temp: 98.1 F (36.7 C)   SpO2: 95% 97%   No complaints Eager to discharge  General: No acute distress. Wound to R nare with scab related to NG, mild surrounding redness Lungs: unlabored Abdomen: Soft, nontender, nondistended  Neurological: Alert and oriented 3. Moves all extremities 4 with equal strength. Cranial nerves II through XII grossly intact. Extremities: No clubbing or cyanosis. No edema.   Discharge Instructions   Discharge Instructions     Call MD for:  difficulty breathing, headache or visual disturbances   Complete by: As directed    Call MD for:  extreme fatigue   Complete by: As directed    Call MD for:  hives   Complete by: As directed    Call MD for:  persistant dizziness or light-headedness   Complete by: As directed    Call MD for:  persistant nausea and vomiting   Complete by: As directed    Call MD for:  redness, tenderness, or signs of infection (pain, swelling, redness, odor or  green/yellow discharge around incision site)   Complete by: As directed    Call MD for:  severe uncontrolled pain   Complete by: As directed    Call MD for:  temperature >100.4   Complete by: As directed    Diet - low sodium heart healthy   Complete by: As directed    Discharge instructions   Complete by: As directed    You were admitted with Shane Clark bowel obstruction.  You've  improved with conservative management.  Follow outpatient with surgery as directed.  Follow Shane Clark diet as discussed with surgery (small meals, low residue diet) and frequent ambulation/walking.  Keep an eye on the sore on your nose.  You can use vaseline on this wound.  Keep an eye out for worsening redness or worsening wound.   Return for new, recurrent, or worsening symptoms.  Please ask your PCP to request records from this hospitalization so they know what was done and what the next steps will be.   Increase activity slowly   Complete by: As directed       Allergies as of 05/01/2023       Reactions   Codeine Shortness Of Breath   Percocet [oxycodone-acetaminophen] Anaphylaxis   Angiotensin Receptor Blockers Other (See Comments)   Dyspnea   Crestor [rosuvastatin]    Other reaction(s): Feet Hurt   Lyrica [pregabalin] Other (See Comments)   Headache   Mevacor [lovastatin]    Joint Pain   Pravachol [pravastatin] Other (See Comments)   Talwin [pentazocine] Nausea Only, Other (See Comments)   Stopped breathing   Zestril [lisinopril] Other (See Comments)   Dyspnea   Zetia [ezetimibe] Itching   Advair Diskus [fluticasone-salmeterol] Rash   unknown        Medication List     STOP taking these medications    diclofenac 75 MG EC tablet Commonly known as: VOLTAREN   methocarbamol 750 MG tablet Commonly known as: Robaxin-750   predniSONE 10 MG (21) Tbpk tablet Commonly known as: STERAPRED UNI-PAK 21 TAB       TAKE these medications    acetaminophen 500 MG tablet Commonly known as: TYLENOL Take 1,000 mg by mouth every 6 (six) hours as needed for moderate pain.   albuterol 108 (90 Base) MCG/ACT inhaler Commonly known as: VENTOLIN HFA Inhale 1-2 puffs into the lungs every 4 (four) hours as needed. What changed: Another medication with the same name was changed. Make sure you understand how and when to take each.   albuterol 108 (90 Base) MCG/ACT inhaler Commonly  known as: ProAir HFA Inhale 1-2 puffs into the lungs every 4 (four) hours as needed. What changed:  when to take this reasons to take this   aspirin EC 81 MG tablet Take 81 mg by mouth daily. Swallow whole.   azelastine 0.1 % nasal spray Commonly known as: ASTELIN Place 2 sprays into both nostrils 2 (two) times daily as needed for congestion.   loratadine 10 MG tablet Commonly known as: CLARITIN Take 10 mg by mouth daily.   nitroGLYCERIN 0.4 MG SL tablet Commonly known as: NITROSTAT Place 1 tablet (0.4 mg total) under the tongue as needed. What changed:  when to take this reasons to take this   pantoprazole 40 MG tablet Commonly known as: Protonix Take 1 tablet (40 mg total) by mouth daily for acid reflux.   rosuvastatin 5 MG tablet Commonly known as: CRESTOR Take 1 tablet (5 mg total) by mouth daily.   Symbicort 80-4.5 MCG/ACT inhaler  Generic drug: budesonide-formoterol Inhale 2 puffs into the lungs 2 (two) times daily.   tamsulosin 0.4 MG Caps capsule Commonly known as: FLOMAX Take 1 capsule (0.4 mg total) by mouth daily, 30 minutes after the same meal each day for urinary symptoms   telmisartan 20 MG tablet Commonly known as: MICARDIS Take 1 tablet (20 mg total) by mouth daily for blood pressure   Zinc 100 MG Tabs Take 1 tablet by mouth daily.   ZINC PO Take 1 tablet by mouth daily.       Allergies  Allergen Reactions   Codeine Shortness Of Breath   Percocet [Oxycodone-Acetaminophen] Anaphylaxis   Angiotensin Receptor Blockers Other (See Comments)    Dyspnea   Crestor [Rosuvastatin]     Other reaction(s): Feet Hurt   Lyrica [Pregabalin] Other (See Comments)    Headache   Mevacor [Lovastatin]     Joint Pain   Pravachol [Pravastatin] Other (See Comments)   Talwin [Pentazocine] Nausea Only and Other (See Comments)    Stopped breathing   Zestril [Lisinopril] Other (See Comments)    Dyspnea   Zetia [Ezetimibe] Itching   Advair Diskus  [Fluticasone-Salmeterol] Rash    unknown      The results of significant diagnostics from this hospitalization (including imaging, microbiology, ancillary and laboratory) are listed below for reference.    Significant Diagnostic Studies: DG Abd Portable 1V-Small Bowel Obstruction Protocol-initial, 8 hr delay  Result Date: 04/29/2023 CLINICAL DATA:  8 hour small-bowel follow up EXAM: PORTABLE ABDOMEN - 1 VIEW COMPARISON:  Film from the previous day. FINDINGS: Scattered large and small bowel gas is noted. Previously administered contrast now lies entirely within the colon. IMPRESSION: Contrast administered lies in the colon consistent with AA partial small bowel obstruction. No small bowel dilatation is seen at this time. Electronically Signed   By: Alcide Clever M.D.   On: 04/29/2023 22:50   DG Abd 1 View  Result Date: 04/28/2023 CLINICAL DATA:  Nasogastric tube placement EXAM: ABDOMEN - 1 VIEW COMPARISON:  04/28/2023 CT scan FINDINGS: Nasogastric tube tip is in the stomach body with side port in the vicinity of the gastroesophageal junction/gastric cardia. Thoracic spondylosis. Potential mildly dilated loop of small bowel in the right upper abdomen. IMPRESSION: 1. Nasogastric tube tip is in the stomach body with side port in the vicinity of the gastroesophageal junction/gastric cardia. 2. Potential mildly dilated loop of small bowel in the right upper abdomen. Electronically Signed   By: Gaylyn Rong M.D.   On: 04/28/2023 19:29   CT ABDOMEN PELVIS W CONTRAST  Result Date: 04/28/2023 CLINICAL DATA:  Bowel obstruction suspected EXAM: CT ABDOMEN AND PELVIS WITH CONTRAST TECHNIQUE: Multidetector CT imaging of the abdomen and pelvis was performed using the standard protocol following bolus administration of intravenous contrast. RADIATION DOSE REDUCTION: This exam was performed according to the departmental dose-optimization program which includes automated exposure control, adjustment of the mA  and/or kV according to patient size and/or use of iterative reconstruction technique. CONTRAST:  85mL OMNIPAQUE IOHEXOL 300 MG/ML  SOLN COMPARISON:  None Available. FINDINGS: Lower chest: Lung bases are clear. Hepatobiliary: No focal hepatic lesion. Normal gallbladder. No biliary duct dilatation. Common bile duct is normal. Pancreas: Pancreas is normal. No ductal dilatation. No pancreatic inflammation. Spleen: Normal spleen Adrenals/urinary tract: Adrenal glands and kidneys are normal. The ureters and bladder normal. Stomach/Bowel: Stomach is gas distended. No fluid. Duodenum is normal. There is Montie Gelardi loop of dilated small bowel in the RIGHT upper quadrant folded upon itself measuring  4.3 cm (image 45/2). There is air-fluid level within this loop of bowel. The more distal small bowel is reduced in caliber. Small volume gas and stool throughout the colon. No portal venous gas.  No intraperitoneal free air.  No pneumatosis. Vascular/Lymphatic: Abdominal aorta is normal caliber. No periportal or retroperitoneal adenopathy. No pelvic adenopathy. Reproductive: Unremarkable Other: No free fluid. Musculoskeletal: No aggressive osseous lesion. IMPRESSION: 1. Dilated loop of small bowel in the RIGHT upper quadrant with air-fluid level. Relatively decompressed distal small bowel. Findings most consistent with partial small bowel obstruction versus early mechanical obstruction. No evidence of high-grade obstruction. No portal venous gas or pneumatosis. 2. Small volume gas and stool throughout the colon. Electronically Signed   By: Genevive Bi M.D.   On: 04/28/2023 18:21    Microbiology: No results found for this or any previous visit (from the past 240 hour(s)).   Labs: Basic Metabolic Panel: Recent Labs  Lab 04/28/23 1637 04/29/23 0536 04/30/23 0555  NA 137 135 138  K 4.3 3.9 3.8  CL 104 105 104  CO2 24 23 25   GLUCOSE 99 103* 93  BUN 20 19 21   CREATININE 0.96 0.95 1.09  CALCIUM 9.1 8.0* 8.2*  MG  --   --   1.9  PHOS  --   --  2.5   Liver Function Tests: Recent Labs  Lab 04/28/23 1637 04/30/23 0555  AST 20 21  ALT 20 20  ALKPHOS 74 63  BILITOT 1.0 1.1  PROT 7.5 7.0  ALBUMIN 4.7 3.6   Recent Labs  Lab 04/28/23 1637  LIPASE 27   No results for input(s): "AMMONIA" in the last 168 hours. CBC: Recent Labs  Lab 04/28/23 1637 04/29/23 0536 04/30/23 0555  WBC 12.0* 6.6 7.9  NEUTROABS  --   --  5.4  HGB 14.5 12.9* 12.7*  HCT 41.8 38.0* 38.7*  MCV 95.2 96.9 99.0  PLT 210 167 167   Cardiac Enzymes: No results for input(s): "CKTOTAL", "CKMB", "CKMBINDEX", "TROPONINI" in the last 168 hours. BNP: BNP (last 3 results) Recent Labs    04/29/23 0536  BNP 21.3    ProBNP (last 3 results) No results for input(s): "PROBNP" in the last 8760 hours.  CBG: No results for input(s): "GLUCAP" in the last 168 hours.     Signed:  Lacretia Nicks MD.  [facility] 05/01/2023, 9:34 AM

## 2023-05-01 NOTE — Progress Notes (Signed)
   Subjective/Chief Complaint: Tolerating diet.  Bowels are moving.  No abdominal pain   Objective: Vital signs in last 24 hours: Temp:  [97.7 F (36.5 C)-98.2 F (36.8 C)] 98.1 F (36.7 C) (05/31 0520) Pulse Rate:  [63-68] 68 (05/31 0520) Resp:  [18-19] 18 (05/31 0520) BP: (109-132)/(70-72) 132/72 (05/31 0520) SpO2:  [95 %-98 %] 97 % (05/31 0741) Last BM Date : 04/30/23  Intake/Output from previous day: No intake/output data recorded. Intake/Output this shift: No intake/output data recorded.  Abdomen: Soft nontender without rebound or guarding.  Scars noted.  Lab Results:  Recent Labs    04/29/23 0536 04/30/23 0555  WBC 6.6 7.9  HGB 12.9* 12.7*  HCT 38.0* 38.7*  PLT 167 167   BMET Recent Labs    04/29/23 0536 04/30/23 0555  NA 135 138  K 3.9 3.8  CL 105 104  CO2 23 25  GLUCOSE 103* 93  BUN 19 21  CREATININE 0.95 1.09  CALCIUM 8.0* 8.2*   PT/INR No results for input(s): "LABPROT", "INR" in the last 72 hours. ABG No results for input(s): "PHART", "HCO3" in the last 72 hours.  Invalid input(s): "PCO2", "PO2"  Studies/Results: DG Abd Portable 1V-Small Bowel Obstruction Protocol-initial, 8 hr delay  Result Date: 04/29/2023 CLINICAL DATA:  8 hour small-bowel follow up EXAM: PORTABLE ABDOMEN - 1 VIEW COMPARISON:  Film from the previous day. FINDINGS: Scattered large and small bowel gas is noted. Previously administered contrast now lies entirely within the colon. IMPRESSION: Contrast administered lies in the colon consistent with AA partial small bowel obstruction. No small bowel dilatation is seen at this time. Electronically Signed   By: Alcide Clever M.D.   On: 04/29/2023 22:50    Anti-infectives: Anti-infectives (From admission, onward)    None       Assessment/Plan: PSBO resolving  Okay for discharge from surgical standpoint.  We discussed dietary discretion especially small meals, low residue diet, and walking daily.  LOS: 3 days  STRAIGHT  FORWARD   Dortha Schwalbe  MD 05/01/2023

## 2023-05-14 ENCOUNTER — Ambulatory Visit (AMBULATORY_SURGERY_CENTER): Payer: Medicare Other

## 2023-05-14 ENCOUNTER — Other Ambulatory Visit: Payer: Self-pay

## 2023-05-14 VITALS — Ht 68.0 in | Wt 245.0 lb

## 2023-05-14 DIAGNOSIS — Z86010 Personal history of colonic polyps: Secondary | ICD-10-CM

## 2023-05-14 NOTE — Progress Notes (Signed)
Denies allergies to eggs or soy products. Denies complication of anesthesia or sedation. Denies use of weight loss medication. Denies use of O2.   Emmi instructions given for colonoscopy.  

## 2023-05-19 DIAGNOSIS — Z8719 Personal history of other diseases of the digestive system: Secondary | ICD-10-CM | POA: Diagnosis not present

## 2023-05-20 ENCOUNTER — Encounter: Payer: Self-pay | Admitting: Internal Medicine

## 2023-06-07 ENCOUNTER — Encounter: Payer: Self-pay | Admitting: Certified Registered Nurse Anesthetist

## 2023-06-11 ENCOUNTER — Encounter: Payer: Self-pay | Admitting: Internal Medicine

## 2023-06-11 ENCOUNTER — Ambulatory Visit (AMBULATORY_SURGERY_CENTER): Payer: Medicare Other | Admitting: Internal Medicine

## 2023-06-11 VITALS — BP 130/80 | HR 76 | Temp 97.5°F | Resp 14 | Ht 68.0 in | Wt 245.0 lb

## 2023-06-11 DIAGNOSIS — D125 Benign neoplasm of sigmoid colon: Secondary | ICD-10-CM

## 2023-06-11 DIAGNOSIS — Z09 Encounter for follow-up examination after completed treatment for conditions other than malignant neoplasm: Secondary | ICD-10-CM | POA: Diagnosis not present

## 2023-06-11 DIAGNOSIS — Z86010 Personal history of colonic polyps: Secondary | ICD-10-CM

## 2023-06-11 MED ORDER — SODIUM CHLORIDE 0.9 % IV SOLN
500.0000 mL | Freq: Once | INTRAVENOUS | Status: DC
Start: 2023-06-11 — End: 2023-06-11

## 2023-06-11 NOTE — Op Note (Signed)
[facility] Patient Name: Shane Clark Procedure Date: 06/11/2023 10:19 AM MRN: [id number] Endoscopist: Iva Boop , MD, [id number] Age: 72 Referring MD:  Date of Birth: 1951-08-17 Gender: Male Account #: [id number] Procedure:                Colonoscopy Indications:              Surveillance: Personal history of adenomatous                            polyps on last colonoscopy > 5 years ago, Last                            colonoscopy: 2017 Medicines:                Monitored Anesthesia Care Procedure:                Pre-Anesthesia Assessment:                           - Prior to the procedure, a History and Physical                            was performed, and patient medications and                            allergies were reviewed. The patient's tolerance of                            previous anesthesia was also reviewed. The risks                            and benefits of the procedure and the sedation                            options and risks were discussed with the patient.                            All questions were answered, and informed consent                            was obtained. Prior Anticoagulants: The patient has                            taken no anticoagulant or antiplatelet agents. ASA                            Grade Assessment: II - A patient with mild systemic                            disease. After reviewing the risks and benefits,                            the patient was deemed in satisfactory condition to  undergo the procedure.                           After obtaining informed consent, the colonoscope                            was passed under direct vision. Throughout the                            procedure, the patient's blood pressure, pulse, and                            oxygen saturations were monitored continuously. The                            Olympus Scope SN: [id number] was introduced through                             the anus and advanced to the the cecum, identified                            by appendiceal orifice and ileocecal valve. The                            colonoscopy was somewhat difficult due to                            significant looping. Successful completion of the                            procedure was aided by applying abdominal pressure.                            The patient tolerated the procedure well. The                            quality of the bowel preparation was good. The                            ileocecal valve, appendiceal orifice, and rectum                            were photographed. The bowel preparation used was                            Miralax via split dose instruction. Scope In: 10:27:29 AM Scope Out: 10:46:54 AM Scope Withdrawal Time: 0 hours 11 minutes 44 seconds  Total Procedure Duration: 0 hours 19 minutes 25 seconds  Findings:                 The perianal and digital rectal examinations were                            normal. Pertinent negatives include normal prostate                            (  size, shape, and consistency).                           A diminutive polyp was found in the proximal                            sigmoid colon. The polyp was sessile. The polyp was                            removed with a cold snare. Resection and retrieval                            were complete. Verification of patient                            identification for the specimen was done. Estimated                            blood loss was minimal.                           The exam was otherwise without abnormality on                            direct and retroflexion views. Complications:            No immediate complications. Estimated Blood Loss:     Estimated blood loss was minimal. Impression:               - One diminutive polyp in the proximal sigmoid                            colon, removed with a cold snare.  Resected and                            retrieved.                           - The examination was otherwise normal on direct                            and retroflexion views. There was significant                            looping requiring simultaneous LLQ and                            periumbilical abdominal pressure application.                            Consider abdominal binder use for any future                            colonoscopy.                           -  Personal history of colonic polyps. 2 diminutive                            adenomas removed 2017 Recommendation:           - Patient has a contact number available for                            emergencies. The signs and symptoms of potential                            delayed complications were discussed with the                            patient. Return to normal activities tomorrow.                            Written discharge instructions were provided to the                            patient.                           - Resume previous diet.                           - Continue present medications.                           - No recommendation at this time regarding repeat                            colonoscopy due to age. Iva Boop, MD 06/11/2023 10:54:28 AM This report has been signed electronically.

## 2023-06-11 NOTE — Progress Notes (Signed)
[facility] History and Physical   Primary Care Physician:  [name], MD   Reason for Procedure:   Prior colon polyps, for surveillance  Plan:    colonoscopy     HPI: Shane Clark is a 72 y.o. male s/p removal of 2 diminutive adenomas in 2017.   Past Medical History:  Diagnosis Date   Allergy    Arthritis    Asthma    BPH (benign prostatic hyperplasia)    Cataract    Chronic back pain    "all over"   COPD (chronic obstructive pulmonary disease) (HCC)    GERD (gastroesophageal reflux disease)    Hx of adenomatous colonic polyps 03/22/2016   Hypertension    Influenza 11/20/2011   Large bowel obstruction (HCC)    Sepsis (HCC) 02/05/2016    Past Surgical History:  Procedure Laterality Date   ABDOMINAL HERNIA REPAIR     ANTERIOR CERVICAL DECOMP/DISCECTOMY FUSION     C6-8   APPENDECTOMY  1960s   BACK SURGERY     COLONOSCOPY     HERNIA REPAIR     INSERTION DISTRACTION DEVICE LUMBAR POSTERIOR SPINOUS PROCESS     KNEE ARTHROSCOPY Left    SHOULDER SURGERY Left 02/11/2017   SMALL BOWEL REPAIR      Prior to Admission medications   Medication Sig Start Date End Date Taking? Authorizing Provider  aspirin EC 81 MG tablet Take 81 mg by mouth daily. Swallow whole.   Yes [provider]  budesonide-formoterol (SYMBICORT) 80-4.5 MCG/ACT inhaler Inhale 2 puffs into the lungs 2 (two) times daily. 01/23/23  Yes   loratadine (CLARITIN) 10 MG tablet Take 10 mg by mouth daily.   Yes [provider]  Multiple Vitamins-Minerals (ZINC PO) Take 1 tablet by mouth daily.   Yes [provider]  pantoprazole (PROTONIX) 40 MG tablet Take 1 tablet (40 mg total) by mouth daily for acid reflux. 01/23/23  Yes   polyethylene glycol powder (GLYCOLAX/MIRALAX) 17 GM/SCOOP powder Take 1 Container by mouth once.   Yes [provider]  rosuvastatin (CRESTOR) 5 MG tablet Take 1 tablet (5 mg total) by mouth daily. 12/18/22  Yes Pricilla Riffle, MD   tamsulosin (FLOMAX) 0.4 MG CAPS capsule Take 1 capsule (0.4 mg total) by mouth daily, 30 minutes after the same meal each day for urinary symptoms 01/23/23  Yes   telmisartan (MICARDIS) 20 MG tablet Take 1 tablet (20 mg total) by mouth daily for blood pressure 01/23/23  Yes   acetaminophen (TYLENOL) 500 MG tablet Take 1,000 mg by mouth every 6 (six) hours as needed for moderate pain.    [provider]  albuterol (PROAIR HFA) 108 (90 Base) MCG/ACT inhaler Inhale 1-2 puffs into the lungs every 4 (four) hours as needed. Patient taking differently: Inhale 1-2 puffs into the lungs every 6 (six) hours as needed for wheezing or shortness of breath. 01/23/23     albuterol (VENTOLIN HFA) 108 (90 Base) MCG/ACT inhaler Inhale 1-2 puffs into the lungs every 4 (four) hours as needed. 05/03/21     azelastine (ASTELIN) 0.1 % nasal spray Place 2 sprays into both nostrils 2 (two) times daily as needed for congestion. Patient not taking: Reported on 06/11/2023 01/06/20   [provider]  nitroGLYCERIN (NITROSTAT) 0.4 MG SL tablet Place 1 tablet (0.4 mg total) under the tongue as needed. Patient not taking: Reported on 06/11/2023 07/16/21   Pricilla Riffle, MD  Zinc 100 MG TABS Take 1 tablet by mouth  daily.    [provider]    Current Outpatient Medications  Medication Sig Dispense Refill   aspirin EC 81 MG tablet Take 81 mg by mouth daily. Swallow whole.     budesonide-formoterol (SYMBICORT) 80-4.5 MCG/ACT inhaler Inhale 2 puffs into the lungs 2 (two) times daily. 30.6 g 3   loratadine (CLARITIN) 10 MG tablet Take 10 mg by mouth daily.     Multiple Vitamins-Minerals (ZINC PO) Take 1 tablet by mouth daily.     pantoprazole (PROTONIX) 40 MG tablet Take 1 tablet (40 mg total) by mouth daily for acid reflux. 90 tablet 4   polyethylene glycol powder (GLYCOLAX/MIRALAX) 17 GM/SCOOP powder Take 1 Container by mouth once.     rosuvastatin (CRESTOR) 5 MG tablet Take 1 tablet (5 mg total) by mouth daily.  90 tablet 1   tamsulosin (FLOMAX) 0.4 MG CAPS capsule Take 1 capsule (0.4 mg total) by mouth daily, 30 minutes after the same meal each day for urinary symptoms 90 capsule 4   telmisartan (MICARDIS) 20 MG tablet Take 1 tablet (20 mg total) by mouth daily for blood pressure 90 tablet 4   acetaminophen (TYLENOL) 500 MG tablet Take 1,000 mg by mouth every 6 (six) hours as needed for moderate pain.     albuterol (PROAIR HFA) 108 (90 Base) MCG/ACT inhaler Inhale 1-2 puffs into the lungs every 4 (four) hours as needed. (Patient taking differently: Inhale 1-2 puffs into the lungs every 6 (six) hours as needed for wheezing or shortness of breath.) 6.7 g 3   albuterol (VENTOLIN HFA) 108 (90 Base) MCG/ACT inhaler Inhale 1-2 puffs into the lungs every 4 (four) hours as needed. 8.5 g 3   azelastine (ASTELIN) 0.1 % nasal spray Place 2 sprays into both nostrils 2 (two) times daily as needed for congestion. (Patient not taking: Reported on 06/11/2023)     nitroGLYCERIN (NITROSTAT) 0.4 MG SL tablet Place 1 tablet (0.4 mg total) under the tongue as needed. (Patient not taking: Reported on 06/11/2023) 25 tablet 2   Zinc 100 MG TABS Take 1 tablet by mouth daily.     Current Facility-Administered Medications  Medication Dose Route Frequency Provider Last Rate Last Admin   0.9 %  sodium chloride infusion  500 mL Intravenous Once Iva Boop, MD        Allergies as of 06/11/2023 - Review Complete 06/11/2023  Allergen Reaction Noted   Codeine Shortness Of Breath 01/22/2022   Percocet [oxycodone-acetaminophen] Anaphylaxis 04/09/2022   Angiotensin receptor blockers Other (See Comments) 01/22/2022   Crestor [rosuvastatin]  01/22/2022   Lyrica [pregabalin] Other (See Comments) 01/22/2022   Mevacor [lovastatin]  01/22/2022   Pravachol [pravastatin] Other (See Comments) 01/22/2022   Talwin [pentazocine] Nausea Only and Other (See Comments) 04/09/2022   Zestril [lisinopril] Other (See Comments) 01/22/2022   Zetia  [ezetimibe] Itching 01/22/2022   Advair diskus [fluticasone-salmeterol] Rash 11/20/2011    Family History  Problem Relation Age of Onset   Dementia Mother    Coronary artery disease Father    Pancreatic cancer Brother    Stroke Brother    Heart disease Brother    Diabetes type II Other    Colon cancer Neg Hx    Colon polyps Neg Hx    Rectal cancer Neg Hx    Stomach cancer Neg Hx    Esophageal cancer Neg Hx     Social History   Socioeconomic History   Marital status: Married    Spouse name: Not on file  Number of children: Not on file   Years of education: Not on file   Highest education level: Not on file  Occupational History   Not on file  Tobacco Use   Smoking status: Former    Current packs/day: 0.12    Average packs/day: 0.1 packs/day for 20.0 years (2.4 ttl pk-yrs)    Types: Cigarettes   Smokeless tobacco: Former   Tobacco comments:    "quit smoking cigarettes in the 1980s"  Substance and Sexual Activity   Alcohol use: No    Alcohol/week: 0.0 standard drinks of alcohol   Drug use: No   Sexual activity: Yes  Other Topics Concern   Not on file  Social History Narrative   Not on file   Social Determinants of Health   Financial Resource Strain: Not on file  Food Insecurity: No Food Insecurity (04/29/2023)   Hunger Vital Sign    Worried About Running Out of Food in the Last Year: Never true    Ran Out of Food in the Last Year: Never true  Transportation Needs: No Transportation Needs (04/29/2023)   PRAPARE - Administrator, Civil Service (Medical): No    Lack of Transportation (Non-Medical): No  Physical Activity: Not on file  Stress: Not on file  Social Connections: Not on file  Intimate Partner Violence: Not At Risk (04/29/2023)   Humiliation, Afraid, Rape, and Kick questionnaire    Fear of Current or Ex-Partner: No    Emotionally Abused: No    Physically Abused: No    Sexually Abused: No    Review of Systems:  All other review of  systems negative except as mentioned in the HPI.  Physical Exam: Vital signs BP 130/75   Pulse 79   Temp (!) 97.5 F (36.4 C)   Ht 5\' 8"  (1.727 m)   Wt 245 lb (111.1 kg)   SpO2 95%   BMI 37.25 kg/m   General:   Alert,  Well-developed, well-nourished, pleasant and cooperative in NAD Lungs:  Clear throughout to auscultation.   Heart:  Regular rate and rhythm; no murmurs, clicks, rubs,  or gallops. Abdomen:  Soft, nontender and nondistended. Normal bowel sounds.   Neuro/Psych:  Alert and cooperative. Normal mood and affect. A and O x 3   @Wadell Craddock  Sena Slate, MD, [facility] [phone] (pager) 06/11/2023 10:16 AM@

## 2023-06-11 NOTE — Progress Notes (Signed)
Called to room to assist during endoscopic procedure.  Patient ID and intended procedure confirmed with present staff. Received instructions for my participation in the procedure from the performing physician.  

## 2023-06-11 NOTE — Patient Instructions (Addendum)
I found and removed one tiny polyp today. It will be analyzed and I will let you know what it was (looks benign). Looks like you will not need another routine colonoscopy based upon these findings and history but will determine after pathology review.  I appreciate the opportunity to care for you. Iva Boop, MD, [facility]   Please see handouts given to you on Polyps.  YOU HAD AN ENDOSCOPIC PROCEDURE TODAY AT THE [facility]:   Refer to the procedure report that was given to you for any specific questions about what was found during the examination.  If the procedure report does not answer your questions, please call your gastroenterologist to clarify.  If you requested that your care partner not be given the details of your procedure findings, then the procedure report has been included in a sealed envelope for you to review at your convenience later.  YOU SHOULD EXPECT: Some feelings of bloating in the abdomen. Passage of more gas than usual.  Walking can help get rid of the air that was put into your GI tract during the procedure and reduce the bloating. If you had a lower endoscopy (such as a colonoscopy or flexible sigmoidoscopy) you may notice spotting of blood in your stool or on the toilet paper. If you underwent a bowel prep for your procedure, you may not have a normal bowel movement for a few days.  Please Note:  You might notice some irritation and congestion in your nose or some drainage.  This is from the oxygen used during your procedure.  There is no need for concern and it should clear up in a day or so.  SYMPTOMS TO REPORT IMMEDIATELY:  Following lower endoscopy (colonoscopy or flexible sigmoidoscopy):  Excessive amounts of blood in the stool  Significant tenderness or worsening of abdominal pains  Swelling of the abdomen that is new, acute  Fever of 100F or higher  For urgent or emergent issues, a gastroenterologist can be reached at any hour by calling [phone]  [phone]. Do not use MyChart messaging for urgent concerns.    DIET:  We do recommend a small meal at first, but then you may proceed to your regular diet.  Drink plenty of fluids but you should avoid alcoholic beverages for 24 hours.  ACTIVITY:  You should plan to take it easy for the rest of today and you should NOT DRIVE or use heavy machinery until tomorrow (because of the sedation medicines used during the test).    FOLLOW UP: Our staff will call the number listed on your records the next business day following your procedure.  We will call around 7:15- 8:00 am to check on you and address any questions or concerns that you may have regarding the information given to you following your procedure. If we do not reach you, we will leave a message.     If any biopsies were taken you will be contacted by phone or by letter within the next 1-3 weeks.  Please call us at [phone] if you have not heard about the biopsies in 3 weeks.    SIGNATURES/CONFIDENTIALITY: You and/or your care partner have signed paperwork which will be entered into your electronic medical record.  These signatures attest to the fact that that the information above on your After Visit Summary has been reviewed and is understood.  Full responsibility of the confidentiality of this discharge information lies with you and/or your care-partner.

## 2023-06-11 NOTE — Progress Notes (Signed)
Report given to PACU, vss 

## 2023-06-12 ENCOUNTER — Telehealth: Payer: Self-pay | Admitting: *Deleted

## 2023-06-12 NOTE — Telephone Encounter (Signed)
  Follow up Call-     06/11/2023    9:58 AM  Call back number  Post procedure Call Back phone  # [phone]  Permission to leave phone message Yes     Patient questions:  Do you have a fever, pain , or abdominal swelling? No. Pain Score  0 *  Have you tolerated food without any problems? Yes.    Have you been able to return to your normal activities? Yes.    Do you have any questions about your discharge instructions: Diet   No. Medications  No. Follow up visit  No.  Do you have questions or concerns about your Care? No.  Actions: * If pain score is 4 or above: No action needed, pain <4.

## 2023-06-21 ENCOUNTER — Other Ambulatory Visit: Payer: Self-pay | Admitting: Internal Medicine

## 2023-06-21 ENCOUNTER — Encounter: Payer: Self-pay | Admitting: Internal Medicine

## 2023-06-22 ENCOUNTER — Other Ambulatory Visit (HOSPITAL_COMMUNITY): Payer: Self-pay

## 2023-06-22 MED ORDER — ROSUVASTATIN CALCIUM 5 MG PO TABS
5.0000 mg | ORAL_TABLET | Freq: Every day | ORAL | 0 refills | Status: DC
  Filled 2023-06-22: qty 90, 90d supply, fill #0

## 2023-07-12 NOTE — Progress Notes (Signed)
Cardiology Office Note   Date:  07/18/2023   ID:  Shane Clark, DOB Apr 14, 1951, MRN [id number]  PCP:  [name], MD  Cardiologist:   [name], MD   Pt presents for follow up of CP     History of Present Illness: Shane Clark is a 72 y.o. male with a history of chest pain  The pt has hs had several caths in past that showed no evidence of CAD Last cath in 1998   Myoview in 2007 was normal  IN June 2022 Episode of CP  CCTA  Ca score 53   Angiogram showed minimal CAD   I saw the pt in June 2023   Pt admitted for SBO in May 2024   Rx medically  Since seen the pt denies CP  Breathing is OK   Denies palpitations   Denies abdominal pain    Current Meds  Medication Sig   acetaminophen (TYLENOL) 500 MG tablet Take 1,000 mg by mouth every 6 (six) hours as needed for moderate pain.   albuterol (PROAIR HFA) 108 (90 Base) MCG/ACT inhaler Inhale 1-2 puffs into the lungs every 4 (four) hours as needed. (Patient taking differently: Inhale 1-2 puffs into the lungs every 6 (six) hours as needed for wheezing or shortness of breath.)   budesonide-formoterol (SYMBICORT) 80-4.5 MCG/ACT inhaler Inhale 2 puffs into the lungs 2 (two) times daily.   loratadine (CLARITIN) 10 MG tablet Take 10 mg by mouth daily.   Multiple Vitamins-Minerals (ZINC PO) Take 1 tablet by mouth daily.   nitroGLYCERIN (NITROSTAT) 0.4 MG SL tablet Place 1 tablet (0.4 mg total) under the tongue as needed.   pantoprazole (PROTONIX) 40 MG tablet Take 1 tablet (40 mg total) by mouth daily for acid reflux.   rosuvastatin (CRESTOR) 5 MG tablet Take 1 tablet (5 mg total) by mouth daily.   tamsulosin (FLOMAX) 0.4 MG CAPS capsule Take 1 capsule (0.4 mg total) by mouth daily, 30 minutes after the same meal each day for urinary symptoms   telmisartan (MICARDIS) 20 MG tablet Take 1 tablet (20 mg total) by mouth daily for blood pressure     Allergies:   Codeine, Percocet [oxycodone-acetaminophen], Angiotensin receptor blockers,  Crestor [rosuvastatin], Lyrica [pregabalin], Mevacor [lovastatin], Pravachol [pravastatin], Talwin [pentazocine], Zestril [lisinopril], Zetia [ezetimibe], and Advair diskus [fluticasone-salmeterol]   Past Medical History:  Diagnosis Date   Allergy    Arthritis    Asthma    BPH (benign prostatic hyperplasia)    Cataract    Chronic back pain    "all over"   COPD (chronic obstructive pulmonary disease) (HCC)    GERD (gastroesophageal reflux disease)    Hx of adenomatous colonic polyps 03/22/2016   Hypertension    Influenza 11/20/2011   Sepsis (HCC) 02/05/2016   Small bowel obstruction (HCC)     Past Surgical History:  Procedure Laterality Date   ABDOMINAL HERNIA REPAIR     ANTERIOR CERVICAL DECOMP/DISCECTOMY FUSION     C6-8   APPENDECTOMY  1960s   BACK SURGERY     COLONOSCOPY     HERNIA REPAIR     INSERTION DISTRACTION DEVICE LUMBAR POSTERIOR SPINOUS PROCESS     KNEE ARTHROSCOPY Left    SHOULDER SURGERY Left 02/11/2017   SMALL BOWEL REPAIR       Social History:  The patient  reports that he has quit smoking. His smoking use included cigarettes. He has a 2.4 pack-year smoking history. He has quit using smokeless tobacco. He reports  that he does not drink alcohol and does not use drugs.   Family History:  The patient's family history includes Coronary artery disease in his father; Dementia in his mother; Diabetes type II in an other family member; Heart disease in his brother; Pancreatic cancer in his brother; Stroke in his brother.    ROS:  Please see the history of present illness. All other systems are reviewed and  Negative to the above problem except as noted.    PHYSICAL EXAM: VS:  BP 128/66   Pulse 80   Ht 5\' 8"  (1.727 m)   Wt 245 lb (111.1 kg)   BMI 37.25 kg/m   GEN: Morbidly obese 72 yo  in no acute distress  HEENT: normal  Neck: no JVD, carotid bruit Cardiac: RRR; no murmur  No LE edema  Respiratory:  clear to auscultation  Chest  Nontender  GI: soft   Nontender  No masses   EKG:  EKG is not ordered today.   Lipid Panel    Component Value Date/Time   CHOL 87 (L) 09/16/2021 0718   TRIG 44 09/16/2021 0718   HDL 39 (L) 09/16/2021 0718   CHOLHDL 2.2 09/16/2021 0718   CHOLHDL 4.8 CALC 03/29/2008 1401   VLDL 12 03/29/2008 1401   LDLCALC 36 09/16/2021 0718      Wt Readings from Last 3 Encounters:  07/17/23 245 lb (111.1 kg)  06/11/23 245 lb (111.1 kg)  05/14/23 245 lb (111.1 kg)      ASSESSMENT AND PLAN:  1  chest pressure  Hx of CP in past   CCTA with minimal CAD   Pt denies symptoms  ? GI previously  2  CAD   Minimal on CT scan   Stop ASA  3  Hx Dizziness  Denies     4  HTN  Well controlled   5  HL  With risk factors, minimal CAD I would continue statin.   LDL 40     6  Diet    Limit carbs        Current medicines are reviewed at length with the patient today.  The patient does not have concerns regarding medicines.  Signed, [name], MD  07/18/2023 8:08 AM    [facility] [address] [place], [place], [place]  [address] Phone: [phone]; Fax: [phone]

## 2023-07-17 ENCOUNTER — Encounter: Payer: Self-pay | Admitting: Internal Medicine

## 2023-07-17 ENCOUNTER — Ambulatory Visit: Payer: Medicare Other | Attending: Internal Medicine | Admitting: Internal Medicine

## 2023-07-17 VITALS — BP 128/66 | HR 80 | Ht 68.0 in | Wt 245.0 lb

## 2023-07-17 DIAGNOSIS — I10 Essential (primary) hypertension: Secondary | ICD-10-CM | POA: Diagnosis not present

## 2023-07-17 NOTE — Patient Instructions (Signed)
Medication Instructions:  STOP ASPIRIN   *If you need a refill on your cardiac medications before your next appointment, please call your pharmacy*   Lab Work:  If you have labs (blood work) drawn today and your tests are completely normal, you will receive your results only by: MyChart Message (if you have MyChart) OR A paper copy in the mail If you have any lab test that is abnormal or we need to change your treatment, we will call you to review the results.   Testing/Procedures:    Follow-Up: At [facility], you and your health needs are our priority.  As part of our continuing mission to provide you with exceptional heart care, we have created designated Provider Care Teams.  These Care Teams include your primary Cardiologist (physician) and Advanced Practice Providers (APPs -  Physician Assistants and Nurse Practitioners) who all work together to provide you with the care you need, when you need it.  We recommend signing up for the patient portal called "MyChart".  Sign up information is provided on this After Visit Summary.  MyChart is used to connect with patients for Virtual Visits (Telemedicine).  Patients are able to view lab/test results, encounter notes, upcoming appointments, etc.  Non-urgent messages can be sent to your provider as well.   To learn more about what you can do with MyChart, go to [email or web address].    Your next appointment:   8 month(s)  Provider:   DR [name]    Other Instructions

## 2023-07-26 DIAGNOSIS — U071 COVID-19: Secondary | ICD-10-CM | POA: Diagnosis not present

## 2023-07-26 DIAGNOSIS — R0981 Nasal congestion: Secondary | ICD-10-CM | POA: Diagnosis not present

## 2023-07-26 DIAGNOSIS — R509 Fever, unspecified: Secondary | ICD-10-CM | POA: Diagnosis not present

## 2023-07-26 DIAGNOSIS — J029 Acute pharyngitis, unspecified: Secondary | ICD-10-CM | POA: Diagnosis not present

## 2023-07-26 DIAGNOSIS — R5383 Other fatigue: Secondary | ICD-10-CM | POA: Diagnosis not present

## 2023-08-04 ENCOUNTER — Other Ambulatory Visit: Payer: Self-pay

## 2023-08-04 ENCOUNTER — Other Ambulatory Visit (HOSPITAL_COMMUNITY): Payer: Self-pay

## 2023-08-05 ENCOUNTER — Other Ambulatory Visit (HOSPITAL_COMMUNITY): Payer: Self-pay

## 2023-08-21 ENCOUNTER — Other Ambulatory Visit (HOSPITAL_COMMUNITY): Payer: Self-pay

## 2023-09-23 ENCOUNTER — Other Ambulatory Visit: Payer: Self-pay | Admitting: Internal Medicine

## 2023-09-23 ENCOUNTER — Other Ambulatory Visit (HOSPITAL_COMMUNITY): Payer: Self-pay

## 2023-09-23 MED ORDER — ROSUVASTATIN CALCIUM 5 MG PO TABS
5.0000 mg | ORAL_TABLET | Freq: Every day | ORAL | 3 refills | Status: DC
  Filled 2023-09-23: qty 90, 90d supply, fill #0
  Filled 2024-01-05: qty 90, 90d supply, fill #1

## 2023-11-16 ENCOUNTER — Other Ambulatory Visit: Payer: Self-pay

## 2023-11-16 ENCOUNTER — Other Ambulatory Visit (HOSPITAL_COMMUNITY): Payer: Self-pay

## 2023-12-21 ENCOUNTER — Telehealth: Payer: Self-pay

## 2023-12-21 NOTE — Telephone Encounter (Signed)
Whoever put in the chart is who he will need to request to have it removed.    Kelly    Pt advised and will call the hospital med rec dept.

## 2023-12-21 NOTE — Telephone Encounter (Signed)
-----   Message from [place] sent at 12/20/2023 10:12 PM EST ----- Pt said he saw CHF as Dx on chart   I don't see it on record  Can you check   Not sure how to delete if there

## 2023-12-21 NOTE — Telephone Encounter (Signed)
I called the pt and eh says he saw the DX of CHF on his chart back when he was in the hospital 08/26/22... for small bowel obstruction.... he says an MD at the hosp noted he had CHF and he say he has never been diagnosed with CF... last Echo 2022.   I saw the dx in the D/C summary for that hospital stay and will have to send to my Supervisor since I am not sure how to have that altered.   Pt is adamant that he wants it addressed.    From his chart:  Discharge Diagnoses:  Principal Problem:   Partial small bowel obstruction (HCC) Active Problems:   COPD (chronic obstructive pulmonary disease) (HCC)   Essential hypertension   Chronic diastolic CHF (congestive heart failure) (HCC)   Mixed hyperlipidemia   BPH (benign prostatic hyperplasia)   GERD (gastroesophageal reflux disease)   Leukocytosis

## 2024-01-20 ENCOUNTER — Other Ambulatory Visit (HOSPITAL_COMMUNITY): Payer: Self-pay

## 2024-01-20 ENCOUNTER — Other Ambulatory Visit: Payer: Self-pay

## 2024-01-20 MED ORDER — PANTOPRAZOLE SODIUM 40 MG PO TBEC
40.0000 mg | DELAYED_RELEASE_TABLET | Freq: Every day | ORAL | 4 refills | Status: DC
  Filled 2024-01-20: qty 90, 90d supply, fill #0

## 2024-01-20 MED ORDER — TELMISARTAN 20 MG PO TABS
20.0000 mg | ORAL_TABLET | Freq: Every day | ORAL | 4 refills | Status: DC
  Filled 2024-01-20: qty 90, 90d supply, fill #0
  Filled 2024-05-10: qty 90, 90d supply, fill #1

## 2024-01-20 MED ORDER — ROSUVASTATIN CALCIUM 5 MG PO TABS
5.0000 mg | ORAL_TABLET | Freq: Every day | ORAL | 4 refills | Status: AC
  Filled 2024-04-18: qty 90, 90d supply, fill #0

## 2024-01-20 MED ORDER — TAMSULOSIN HCL 0.4 MG PO CAPS
0.4000 mg | ORAL_CAPSULE | Freq: Every day | ORAL | 4 refills | Status: AC
  Filled 2024-01-20 – 2024-03-06 (×2): qty 90, 90d supply, fill #0
  Filled 2024-06-09: qty 90, 90d supply, fill #1
  Filled 2024-09-01: qty 90, 90d supply, fill #2
  Filled 2024-12-22: qty 90, 90d supply, fill #3

## 2024-01-20 MED ORDER — NITROGLYCERIN 0.4 MG SL SUBL
0.4000 mg | SUBLINGUAL_TABLET | SUBLINGUAL | 11 refills | Status: AC | PRN
  Filled 2024-01-20: qty 25, 25d supply, fill #0
  Filled 2024-09-01: qty 25, 25d supply, fill #1

## 2024-01-20 MED ORDER — BUDESONIDE-FORMOTEROL FUMARATE 80-4.5 MCG/ACT IN AERO
2.0000 | INHALATION_SPRAY | Freq: Two times a day (BID) | RESPIRATORY_TRACT | 3 refills | Status: AC
  Filled 2024-01-20: qty 30.6, 90d supply, fill #0
  Filled 2024-01-22 (×2): qty 10.2, 30d supply, fill #0
  Filled 2024-01-25: qty 30.6, 90d supply, fill #0
  Filled 2024-01-26: qty 20.4, 60d supply, fill #0
  Filled 2024-01-26: qty 30.6, 90d supply, fill #0
  Filled 2024-05-10: qty 30.6, 90d supply, fill #1
  Filled 2024-07-30: qty 30.6, 90d supply, fill #2
  Filled 2024-10-04 – 2024-10-06 (×3): qty 30.6, 90d supply, fill #3

## 2024-01-20 MED ORDER — ALBUTEROL SULFATE HFA 108 (90 BASE) MCG/ACT IN AERS
1.0000 | INHALATION_SPRAY | RESPIRATORY_TRACT | 3 refills | Status: DC | PRN
  Filled 2024-01-20: qty 6.7, 25d supply, fill #0

## 2024-01-22 ENCOUNTER — Other Ambulatory Visit: Payer: Self-pay

## 2024-01-22 ENCOUNTER — Other Ambulatory Visit (HOSPITAL_COMMUNITY): Payer: Self-pay

## 2024-01-25 ENCOUNTER — Other Ambulatory Visit (HOSPITAL_COMMUNITY): Payer: Self-pay

## 2024-01-26 ENCOUNTER — Other Ambulatory Visit (HOSPITAL_COMMUNITY): Payer: Self-pay

## 2024-01-27 ENCOUNTER — Encounter: Payer: Self-pay | Admitting: Orthopaedic Surgery

## 2024-01-27 ENCOUNTER — Ambulatory Visit: Payer: Medicare Other | Admitting: Orthopaedic Surgery

## 2024-01-27 ENCOUNTER — Other Ambulatory Visit (INDEPENDENT_AMBULATORY_CARE_PROVIDER_SITE_OTHER): Payer: Medicare Other

## 2024-01-27 DIAGNOSIS — M1712 Unilateral primary osteoarthritis, left knee: Secondary | ICD-10-CM

## 2024-01-27 MED ORDER — METHYLPREDNISOLONE ACETATE 40 MG/ML IJ SUSP
40.0000 mg | INTRAMUSCULAR | Status: AC | PRN
  Administered 2024-01-27: 40 mg via INTRA_ARTICULAR

## 2024-01-27 MED ORDER — BUPIVACAINE HCL 0.5 % IJ SOLN
2.0000 mL | INTRAMUSCULAR | Status: AC | PRN
  Administered 2024-01-27: 2 mL via INTRA_ARTICULAR

## 2024-01-27 MED ORDER — LIDOCAINE HCL 1 % IJ SOLN
2.0000 mL | INTRAMUSCULAR | Status: AC | PRN
  Administered 2024-01-27: 2 mL

## 2024-01-27 NOTE — Progress Notes (Signed)
 Office Visit Note   Patient: Shane Clark           Date of Birth: 01-16-1951           MRN: [id number] Visit Date: 01/27/2024              Requested by: No referring provider defined for this encounter. PCP: [name], MD (Inactive)   Assessment & Plan: Visit Diagnoses:  1. Unilateral primary osteoarthritis, left knee     Plan: Impression is left knee advanced osteoarthritis.  Today, we repeated the cortisone injection to the left knee.  He tolerated this well.  He will follow-up with [place] as needed.  Call with concerns or questions.  Follow-Up Instructions: Return if symptoms worsen or fail to improve.   Orders:  Orders Placed This Encounter  Procedures   Large Joint Inj   XR KNEE 3 VIEW LEFT   No orders of the defined types were placed in this encounter.     Procedures: Large Joint Inj: L knee on 01/27/2024 12:56 PM Details: 22 G needle Medications: 2 mL bupivacaine 0.5 %; 2 mL lidocaine 1 %; 40 mg methylPREDNISolone acetate 40 MG/ML Outcome: tolerated well, no immediate complications Patient was prepped and draped in the usual sterile fashion.       Clinical Data: No additional findings.   Subjective: Chief Complaint  Patient presents with   Left Knee - Pain    HPI patient is a pleasant 73 year old gentleman who comes in today with recurrent left knee pain.  History of osteoarthritis.  He was seen in our office in January 2024 when cortisone injection was performed.  He had great relief until about 2 weeks ago when his pain returned.  No new injury or change in activity.  The pain he is having is to the anterior medial aspect.  Symptoms occur at night when he is trying to sleep.  He has tried taking Tylenol without relief.  He is here today requesting a another cortisone injection.  Review of Systems as detailed in HPI.  All others reviewed and are negative.   Objective: Vital Signs: There were no vitals taken for this visit.  Physical Exam  well-developed and well-nourished gentleman in no acute distress.  Alert and oriented x 3.  Ortho Exam left knee exam: No effusion.  Range of motion 0 to 115 degrees.  Medial joint line tenderness.  Moderate patellofemoral crepitus.  He is neurovascularly intact distally.  Specialty Comments:  No specialty comments available.  Imaging: XR KNEE 3 VIEW LEFT Result Date: 01/27/2024 Xrays of the left knee show advanced medial and patellofemoral degenerative changes    PMFS History: Patient Active Problem List   Diagnosis Date Noted   Partial small bowel obstruction (HCC) 04/28/2023   Leukocytosis 04/28/2023   Chronic diastolic CHF (congestive heart failure) (HCC) 08/23/2022   GERD (gastroesophageal reflux disease)    Mixed hyperlipidemia    Primary osteoarthritis of right knee 01/24/2021   Primary osteoarthritis of left knee 01/24/2021   Obesity (BMI 30-39.9) 02/16/2017   SLAP tear of shoulder 02/11/2017   Impingement syndrome of left shoulder 02/05/2017   Hx of adenomatous colonic polyps 03/22/2016   Essential hypertension 02/05/2016   COPD (chronic obstructive pulmonary disease) (HCC) 11/21/2011   Diastolic dysfunction 11/21/2011   BPH (benign prostatic hyperplasia) 11/21/2011   Past Medical History:  Diagnosis Date   Allergy    Arthritis    Asthma    BPH (benign prostatic hyperplasia)  Cataract    Chronic back pain    "all over"   COPD (chronic obstructive pulmonary disease) (HCC)    GERD (gastroesophageal reflux disease)    Hx of adenomatous colonic polyps 03/22/2016   Hypertension    Influenza 11/20/2011   Sepsis (HCC) 02/05/2016   Small bowel obstruction (HCC)     Family History  Problem Relation Age of Onset   Dementia Mother    Coronary artery disease Father    Pancreatic cancer Brother    Stroke Brother    Heart disease Brother    Diabetes type II Other    Colon cancer Neg Hx    Colon polyps Neg Hx    Rectal cancer Neg Hx    Stomach cancer Neg Hx     Esophageal cancer Neg Hx     Past Surgical History:  Procedure Laterality Date   ABDOMINAL HERNIA REPAIR     ANTERIOR CERVICAL DECOMP/DISCECTOMY FUSION     C6-8   APPENDECTOMY  1960s   BACK SURGERY     COLONOSCOPY     HERNIA REPAIR     INSERTION DISTRACTION DEVICE LUMBAR POSTERIOR SPINOUS PROCESS     KNEE ARTHROSCOPY Left    SHOULDER SURGERY Left 02/11/2017   SMALL BOWEL REPAIR     Social History   Occupational History   Not on file  Tobacco Use   Smoking status: Former    Current packs/day: 0.12    Average packs/day: 0.1 packs/day for 20.0 years (2.4 ttl pk-yrs)    Types: Cigarettes   Smokeless tobacco: Former   Tobacco comments:    "quit smoking cigarettes in the 1980s"  Substance and Sexual Activity   Alcohol use: No    Alcohol/week: 0.0 standard drinks of alcohol   Drug use: No   Sexual activity: Yes

## 2024-01-31 ENCOUNTER — Emergency Department (HOSPITAL_COMMUNITY)

## 2024-01-31 ENCOUNTER — Observation Stay (HOSPITAL_COMMUNITY)
Admission: EM | Admit: 2024-01-31 | Discharge: 2024-02-01 | Disposition: A | Discharge status: Home / Self Care | Attending: Internal Medicine | Admitting: Internal Medicine

## 2024-01-31 ENCOUNTER — Encounter (HOSPITAL_COMMUNITY): Payer: Self-pay

## 2024-01-31 ENCOUNTER — Other Ambulatory Visit: Payer: Self-pay

## 2024-01-31 DIAGNOSIS — K219 Gastro-esophageal reflux disease without esophagitis: Secondary | ICD-10-CM | POA: Insufficient documentation

## 2024-01-31 DIAGNOSIS — I5032 Chronic diastolic (congestive) heart failure: Secondary | ICD-10-CM | POA: Insufficient documentation

## 2024-01-31 DIAGNOSIS — Z87891 Personal history of nicotine dependence: Secondary | ICD-10-CM | POA: Diagnosis not present

## 2024-01-31 DIAGNOSIS — N400 Enlarged prostate without lower urinary tract symptoms: Secondary | ICD-10-CM | POA: Diagnosis not present

## 2024-01-31 DIAGNOSIS — J449 Chronic obstructive pulmonary disease, unspecified: Secondary | ICD-10-CM | POA: Diagnosis not present

## 2024-01-31 DIAGNOSIS — Z79899 Other long term (current) drug therapy: Secondary | ICD-10-CM | POA: Diagnosis not present

## 2024-01-31 DIAGNOSIS — I110 Hypertensive heart disease with heart failure: Secondary | ICD-10-CM | POA: Diagnosis not present

## 2024-01-31 DIAGNOSIS — R55 Syncope and collapse: Secondary | ICD-10-CM | POA: Diagnosis not present

## 2024-01-31 DIAGNOSIS — D72829 Elevated white blood cell count, unspecified: Secondary | ICD-10-CM | POA: Insufficient documentation

## 2024-01-31 DIAGNOSIS — I10 Essential (primary) hypertension: Secondary | ICD-10-CM | POA: Diagnosis not present

## 2024-01-31 DIAGNOSIS — Z6836 Body mass index (BMI) 36.0-36.9, adult: Secondary | ICD-10-CM | POA: Insufficient documentation

## 2024-01-31 DIAGNOSIS — Z7901 Long term (current) use of anticoagulants: Secondary | ICD-10-CM | POA: Insufficient documentation

## 2024-01-31 DIAGNOSIS — R079 Chest pain, unspecified: Secondary | ICD-10-CM | POA: Diagnosis present

## 2024-01-31 DIAGNOSIS — E782 Mixed hyperlipidemia: Secondary | ICD-10-CM | POA: Diagnosis not present

## 2024-01-31 DIAGNOSIS — E669 Obesity, unspecified: Secondary | ICD-10-CM | POA: Insufficient documentation

## 2024-01-31 LAB — BASIC METABOLIC PANEL
Anion gap: 11 (ref 5–15)
BUN: 17 mg/dL (ref 8–23)
CO2: 22 mmol/L (ref 22–32)
Calcium: 9.2 mg/dL (ref 8.9–10.3)
Chloride: 101 mmol/L (ref 98–111)
Creatinine, Ser: 1.16 mg/dL (ref 0.61–1.24)
GFR, Estimated: >60 mL/min (ref >60)
Glucose, Bld: 131 mg/dL — ABNORMAL HIGH (ref 70–99)
Potassium: 4.5 mmol/L (ref 3.5–5.1)
Sodium: 134 mmol/L — ABNORMAL LOW (ref 135–145)

## 2024-01-31 LAB — CBC
HCT: 40.8 % (ref 39.0–52.0)
Hemoglobin: 14.1 g/dL (ref 13.0–17.0)
MCH: 33.1 pg (ref 26.0–34.0)
MCHC: 34.6 g/dL (ref 30.0–36.0)
MCV: 95.8 fL (ref 80.0–100.0)
Platelets: 236 10*3/uL (ref 150–400)
RBC: 4.26 MIL/uL (ref 4.22–5.81)
RDW: 13 % (ref 11.5–15.5)
WBC: 13.6 10*3/uL — ABNORMAL HIGH (ref 4.0–10.5)
nRBC: 0 % (ref 0.0–0.2)

## 2024-01-31 LAB — TROPONIN I (HIGH SENSITIVITY)
Troponin I (High Sensitivity): 3 ng/L (ref <18)
Troponin I (High Sensitivity): 2 ng/L (ref <18)

## 2024-01-31 MED ORDER — ALBUTEROL SULFATE (2.5 MG/3ML) 0.083% IN NEBU: 3.0000 mL | INHALATION_SOLUTION | Freq: Four times a day (QID) | RESPIRATORY_TRACT | Status: DC | PRN

## 2024-01-31 MED ORDER — POLYETHYLENE GLYCOL 3350 17 G PO PACK: 17.0000 g | PACK | Freq: Every day | ORAL | Status: DC | PRN

## 2024-01-31 MED ORDER — NITROGLYCERIN 0.4 MG SL SUBL: 0.4000 mg | SUBLINGUAL_TABLET | SUBLINGUAL | Status: DC | PRN

## 2024-01-31 MED ORDER — IOHEXOL 350 MG/ML SOLN
75.0000 mL | Freq: Once | INTRAVENOUS | Status: AC | PRN
  Administered 2024-01-31: 75 mL via INTRAVENOUS

## 2024-01-31 MED ORDER — SODIUM CHLORIDE 0.9 % IV BOLUS
1000.0000 mL | Freq: Once | INTRAVENOUS | Status: AC
  Administered 2024-01-31: 1000 mL via INTRAVENOUS

## 2024-01-31 MED ORDER — ROSUVASTATIN CALCIUM 5 MG PO TABS
5.0000 mg | ORAL_TABLET | Freq: Every day | ORAL | Status: DC
  Administered 2024-02-01: 5 mg via ORAL
  Filled 2024-01-31: qty 1

## 2024-01-31 MED ORDER — ACETAMINOPHEN 325 MG PO TABS: 650.0000 mg | ORAL_TABLET | Freq: Four times a day (QID) | ORAL | Status: DC | PRN

## 2024-01-31 MED ORDER — TAMSULOSIN HCL 0.4 MG PO CAPS
0.4000 mg | ORAL_CAPSULE | Freq: Every day | ORAL | Status: DC
  Administered 2024-02-01: 0.4 mg via ORAL
  Filled 2024-01-31 (×2): qty 1

## 2024-01-31 MED ORDER — IRBESARTAN 75 MG PO TABS
75.0000 mg | ORAL_TABLET | Freq: Every day | ORAL | Status: DC
  Administered 2024-02-01: 75 mg via ORAL
  Filled 2024-01-31: qty 1

## 2024-01-31 MED ORDER — SODIUM CHLORIDE 0.9% FLUSH
3.0000 mL | Freq: Two times a day (BID) | INTRAVENOUS | Status: DC
  Administered 2024-01-31 – 2024-02-01 (×2): 3 mL via INTRAVENOUS

## 2024-01-31 MED ORDER — ENOXAPARIN SODIUM 40 MG/0.4ML IJ SOSY
40.0000 mg | PREFILLED_SYRINGE | INTRAMUSCULAR | Status: DC
  Administered 2024-01-31: 40 mg via SUBCUTANEOUS
  Filled 2024-01-31: qty 0.4

## 2024-01-31 MED ORDER — MOMETASONE FURO-FORMOTEROL FUM 100-5 MCG/ACT IN AERO
2.0000 | INHALATION_SPRAY | Freq: Two times a day (BID) | RESPIRATORY_TRACT | Status: DC
  Administered 2024-01-31 – 2024-02-01 (×2): 2 via RESPIRATORY_TRACT
  Filled 2024-01-31: qty 8.8

## 2024-01-31 MED ORDER — NITROGLYCERIN 2 % TD OINT
1.0000 [in_us] | TOPICAL_OINTMENT | Freq: Once | TRANSDERMAL | Status: AC
  Administered 2024-01-31: 1 [in_us] via TOPICAL
  Filled 2024-01-31: qty 1

## 2024-01-31 MED ORDER — LACTATED RINGERS IV BOLUS
1000.0000 mL | Freq: Once | INTRAVENOUS | Status: AC
  Administered 2024-01-31: 1000 mL via INTRAVENOUS

## 2024-01-31 MED ORDER — ACETAMINOPHEN 650 MG RE SUPP: 650.0000 mg | Freq: Four times a day (QID) | RECTAL | Status: DC | PRN

## 2024-01-31 MED ORDER — PANTOPRAZOLE SODIUM 40 MG PO TBEC
40.0000 mg | DELAYED_RELEASE_TABLET | Freq: Every day | ORAL | Status: DC
  Administered 2024-02-01: 40 mg via ORAL
  Filled 2024-01-31: qty 1

## 2024-01-31 NOTE — ED Triage Notes (Signed)
 PT arrives via EMS from home. Pt began experiencing chest pain about 30 mins ago. Reports associated sob. Pt reports coughing since last night. Pt is AxOx4.   EMS administered 1 sl nitroglycerin and 324mg  of aspirin without relief.

## 2024-01-31 NOTE — ED Provider Notes (Signed)
   Accepted handoff at shift change from Amjad PA-C. Please see prior provider note for more detail.   Briefly: Patient is 73 y.o. " concern of chest pain.  This started 30 minutes prior to arrival.  It was associated with diaphoresis, dyspnea, and nausea.  He does report history of angina.  No previous stents.  Currently rates his pain as a 5/6 out of 10.  He was given aspirin and nitroglycerin en route.  He states this was not helpful to his pain.  He states he recently gotten up out of bed and was just finishing shaving when his chest pain came on."  DDX: concern for ACS, PE, dissection, pneumonia, MSK pain, GERD   Plan:  -Dispo pending Cardiology consult -Cardiology recommends inpatient admission for ECHO. - Dr. Alinda Money admitting provider.    Dorthy Cooler, [place] 01/31/24 1616    Jacalyn Lefevre, MD 02/01/24 1739

## 2024-01-31 NOTE — Plan of Care (Signed)
   Problem: Education: Goal: Knowledge of condition and prescribed therapy will improve Outcome: Progressing   Problem: Cardiac: Goal: Will achieve and/or maintain adequate cardiac output Outcome: Progressing

## 2024-01-31 NOTE — ED Notes (Signed)
 Pt transported to xray

## 2024-01-31 NOTE — H&P (Signed)
 History and Physical   LEONARD FEIGEL UEA:[id number] DOB: [birth year] DOA: 01/31/2024  PCP: Debroah Loop, DO   Patient coming from: Home  Chief Complaint: Chest pain, Syncope  HPI: Shane Clark is a 73 y.o. male with medical history significant of hypertension, hyperlipidemia, GERD, BPH, COPD, chronic diastolic CHF, obesity presenting with chest pain and syncope.  Patient reports he was shaving his face this morning and he got sudden chest pain.  Chest pain was associated with diaphoresis, shortness of breath and nausea.  Reports he has a history of angina but has never had any stenting.  States the pain was a 5-6 out of 10 and persisted until he got to the ED.  He did receive aspirin and nitro en route.  Pain lasted a total of about 2 hours.  Later wife is added that patient had episode of syncope where he lost consciousness while he was sitting in a chair Just after chest pain episode started.  He lost consciousness for less than a minute.  Patient denies fevers, chills, abdominal pain, constipation, diarrhea.  ED Course: Vital signs in the ED notable for blood pressure in the 90s to 110 systolic.  Lab workup included BMP with sodium 134, glucose 131.  CBC with leukocytosis of 13.6.  Troponin negative x 2.  Chest x-ray showed minimal atelectasis.  CTA chest abdomen pelvis showed no acute aortic pathology, infrarenal aortic aneurysm measuring 3.2 x 2.8 cm with follow-up recommended, minimal emphysema with mild bronchial wall thickening suspected to be smoking-related, changes suggestive of cirrhosis, prostatomegaly.  Patient received nitroglycerin, 2 L of IV fluid in the ED.  Cardiology consulted and recommended inpatient workup for syncope, but feel his chest pain is noncardiac given 2 negative troponins.  Review of Systems: As per HPI otherwise all other systems reviewed and are negative.  Past Medical History:  Diagnosis Date   Allergy    Arthritis    Asthma    BPH (benign prostatic  hyperplasia)    Cataract    Chronic back pain    "all over"   COPD (chronic obstructive pulmonary disease) (HCC)    GERD (gastroesophageal reflux disease)    Hx of adenomatous colonic polyps 03/22/2016   Hypertension    Influenza 11/20/2011   Sepsis (HCC) 02/05/2016   Small bowel obstruction (HCC)     Past Surgical History:  Procedure Laterality Date   ABDOMINAL HERNIA REPAIR     ANTERIOR CERVICAL DECOMP/DISCECTOMY FUSION     C6-8   APPENDECTOMY  1960s   BACK SURGERY     COLONOSCOPY     HERNIA REPAIR     INSERTION DISTRACTION DEVICE LUMBAR POSTERIOR SPINOUS PROCESS     KNEE ARTHROSCOPY Left    SHOULDER SURGERY Left 02/11/2017   SMALL BOWEL REPAIR      Social History  reports that he has quit smoking. His smoking use included cigarettes. He has a 2.4 pack-year smoking history. He has quit using smokeless tobacco. He reports that he does not drink alcohol and does not use drugs.  Allergies  Allergen Reactions   Codeine Shortness Of Breath   Percocet [Oxycodone-Acetaminophen] Anaphylaxis   Angiotensin Receptor Blockers Other (See Comments)    Dyspnea   Crestor [Rosuvastatin]     Other reaction(s): Feet Hurt   Lyrica [Pregabalin] Other (See Comments)    Headache   Mevacor [Lovastatin]     Joint Pain   Pravachol [Pravastatin] Other (See Comments)   Talwin [Pentazocine] Nausea Only and Other (See Comments)  Stopped breathing   Zestril [Lisinopril] Other (See Comments)    Dyspnea   Zetia [Ezetimibe] Itching   Advair Diskus [Fluticasone-Salmeterol] Rash    unknown    Family History  Problem Relation Age of Onset   Dementia Mother    Coronary artery disease Father    Pancreatic cancer Brother    Stroke Brother    Heart disease Brother    Diabetes type II Other    Colon cancer Neg Hx    Colon polyps Neg Hx    Rectal cancer Neg Hx    Stomach cancer Neg Hx    Esophageal cancer Neg Hx   Reviewed on admission  Prior to Admission medications   Medication Sig  Start Date End Date Taking? Authorizing Provider  acetaminophen (TYLENOL) 500 MG tablet Take 1,000 mg by mouth every 6 (six) hours as needed for moderate pain.   Yes [provider]  albuterol (PROAIR HFA) 108 (90 Base) MCG/ACT inhaler Inhale 1-2 puffs into the lungs every 4 (four) hours as needed. Patient taking differently: Inhale 1-2 puffs into the lungs every 6 (six) hours as needed for wheezing or shortness of breath. 01/23/23  Yes   budesonide-formoterol (SYMBICORT) 80-4.5 MCG/ACT inhaler Inhale 2 puffs into the lungs 2 (two) times daily. 01/20/24  Yes   loratadine (CLARITIN) 10 MG tablet Take 10 mg by mouth daily.   Yes [provider]  Multiple Vitamins-Minerals (ZINC PO) Take 1 tablet by mouth daily.   Yes [provider]  nitroGLYCERIN (NITROSTAT) 0.4 MG SL tablet Place 1 tablet (0.4 mg total) under the tongue as needed. 01/20/24  Yes Sparks, Marcello Moores, DO  pantoprazole (PROTONIX) 40 MG tablet Take 1 tablet (40 mg total) by mouth daily for acid reflux. 01/23/23  Yes   rosuvastatin (CRESTOR) 5 MG tablet Take 1 tablet (5 mg total) by mouth daily for cholesterol. 01/20/24  Yes   tamsulosin (FLOMAX) 0.4 MG CAPS capsule Take 1 capsule (0.4 mg total) by mouth daily, 30 minutes after the same meal each day for urinary symptoms 01/20/24  Yes   telmisartan (MICARDIS) 20 MG tablet Take 1 tablet (20 mg total) by mouth daily for blood pressure. 01/20/24  Yes   albuterol (PROAIR HFA) 108 (90 Base) MCG/ACT inhaler Inhale 1-2 puffs into the lungs every 4 (four) hours as needed. Patient not taking: Reported on 01/31/2024 01/20/24     nitroGLYCERIN (NITROSTAT) 0.4 MG SL tablet Place 1 tablet (0.4 mg total) under the tongue as needed. Patient not taking: Reported on 01/31/2024 07/16/21   Pricilla Riffle, MD  pantoprazole (PROTONIX) 40 MG tablet Take 1 tablet (40 mg total) by mouth daily for acid reflux. Patient not taking: Reported on 01/31/2024 01/20/24     rosuvastatin (CRESTOR) 5 MG tablet Take 1  tablet (5 mg total) by mouth daily. Patient not taking: Reported on 01/31/2024 09/23/23   Pricilla Riffle, MD  tamsulosin [facility]) 0.4 MG CAPS capsule Take 1 capsule (0.4 mg total) by mouth daily, 30 minutes after the same meal each day for urinary symptoms Patient not taking: Reported on 01/31/2024 01/23/23     telmisartan (MICARDIS) 20 MG tablet Take 1 tablet (20 mg total) by mouth daily for blood pressure Patient not taking: Reported on 01/31/2024 01/23/23       Physical Exam: Vitals:   01/31/24 1515 01/31/24 1530 01/31/24 1545 01/31/24 1600  BP: 119/69 113/63 116/63 114/63  Pulse: 61 66 61 (!) 58  Resp: 14 13 14 16   Temp:  TempSrc:      SpO2: 95% 94% 94% 95%  Weight:      Height:        Physical Exam Constitutional:      General: He is not in acute distress.    Appearance: Normal appearance. He is obese.  HENT:     Head: Normocephalic and atraumatic.     Mouth/Throat:     Mouth: Mucous membranes are moist.     Pharynx: Oropharynx is clear.  Eyes:     Extraocular Movements: Extraocular movements intact.     Pupils: Pupils are equal, round, and reactive to light.  Cardiovascular:     Rate and Rhythm: Normal rate and regular rhythm.     Pulses: Normal pulses.     Heart sounds: Normal heart sounds.  Pulmonary:     Effort: Pulmonary effort is normal. No respiratory distress.     Breath sounds: Normal breath sounds.  Abdominal:     General: Bowel sounds are normal. There is no distension.     Palpations: Abdomen is soft.     Tenderness: There is no abdominal tenderness.  Musculoskeletal:        General: No swelling or deformity.  Skin:    General: Skin is warm and dry.  Neurological:     General: No focal deficit present.     Mental Status: Mental status is at baseline.    Labs on Admission: I have personally reviewed following labs and imaging studies  CBC: Recent Labs  Lab 01/31/24 0830  WBC 13.6*  HGB 14.1  HCT 40.8  MCV 95.8  PLT 236    Basic Metabolic  Panel: Recent Labs  Lab 01/31/24 0830  NA 134*  K 4.5  CL 101  CO2 22  GLUCOSE 131*  BUN 17  CREATININE 1.16  CALCIUM 9.2    GFR: Estimated Creatinine Clearance: 71.2 mL/min (by C-G formula based on SCr of 1.16 mg/dL).  Liver Function Tests: No results for input(s): "AST", "ALT", "ALKPHOS", "BILITOT", "PROT", "ALBUMIN" in the last 168 hours.  Urine analysis:    Component Value Date/Time   COLORURINE YELLOW 04/28/2023 1700   APPEARANCEUR CLEAR 04/28/2023 1700   LABSPEC 1.029 04/28/2023 1700   PHURINE 7.5 04/28/2023 1700   GLUCOSEU NEGATIVE 04/28/2023 1700   HGBUR NEGATIVE 04/28/2023 1700   BILIRUBINUR NEGATIVE 04/28/2023 1700   KETONESUR NEGATIVE 04/28/2023 1700   PROTEINUR TRACE (A) 04/28/2023 1700   UROBILINOGEN >=8.0 03/06/2018 1341   NITRITE NEGATIVE 04/28/2023 1700   LEUKOCYTESUR TRACE (A) 04/28/2023 1700    Radiological Exams on Admission: CT Angio Chest/Abd/Pel for Dissection W and/or Wo Contrast Result Date: 01/31/2024 CLINICAL DATA:  Chest pain, acute aortic syndrome suspected EXAM: CT ANGIOGRAPHY CHEST, ABDOMEN AND PELVIS TECHNIQUE: Non-contrast CT of the chest was initially obtained. Multidetector CT imaging through the chest, abdomen and pelvis was performed using the standard protocol during bolus administration of intravenous contrast. Multiplanar reconstructed images and MIPs were obtained and reviewed to evaluate the vascular anatomy. RADIATION DOSE REDUCTION: This exam was performed according to the departmental dose-optimization program which includes automated exposure control, adjustment of the mA and/or kV according to patient size and/or use of iterative reconstruction technique. CONTRAST:  75mL OMNIPAQUE IOHEXOL 350 MG/ML SOLN COMPARISON:  CT abdomen pelvis, 04/28/2023 FINDINGS: CTA CHEST FINDINGS VASCULAR Aorta: Satisfactory opacification of the aorta. Normal contour and caliber of the thoracic aorta. No evidence of aneurysm, dissection, or other acute  aortic pathology. Mild mixed calcific atherosclerosis. Cardiovascular: No evidence of pulmonary  embolism on limited non-tailored examination. Normal heart size. No pericardial effusion. Review of the MIP images confirms the above findings. NON VASCULAR Mediastinum/Nodes: No enlarged mediastinal, hilar, or axillary lymph nodes. Thyroid gland, trachea, and esophagus demonstrate no significant findings. Lungs/Pleura: Bibasilar scarring or atelectasis with elevation of the hemidiaphragms. Mild diffuse bilateral bronchial wall thickening. Minimal paraseptal emphysema and a background of fine centrilobular nodularity, most concentrated in the lung apices. No pleural effusion or pneumothorax. Musculoskeletal: No chest wall abnormality. No acute osseous findings. Review of the MIP images confirms the above findings. CTA ABDOMEN AND PELVIS FINDINGS VASCULAR Aneurysm of the infrarenal abdominal aorta measuring up to 3.2 x 2.8 cm in caliber (series 5, image 201, series 9, image 115). No evidence of dissection or other acute aortic pathology. 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 Review of the MIP images confirms the above findings. NON-VASCULAR Hepatobiliary: No solid liver abnormality is seen. Mildly coarse contour of the liver. No gallstones, gallbladder wall thickening, or biliary dilatation. Pancreas: Unremarkable. No pancreatic ductal dilatation or surrounding inflammatory changes. Spleen: Normal in size without significant abnormality. Adrenals/Urinary Tract: Adrenal glands are unremarkable. Kidneys are normal, without renal calculi, solid lesion, or hydronephrosis. Bladder is unremarkable. Stomach/Bowel: Stomach is within normal limits. Appendix not clearly visualized. No evidence of bowel wall thickening, distention, or inflammatory changes. Lymphatic: No enlarged abdominal or pelvic lymph nodes. Reproductive: Prostatomegaly.  Other: No abdominal wall hernia or abnormality. No ascites. Musculoskeletal: No acute osseous findings. Disc degenerative disease and bridging osteophytosis throughout the thoracic and lumbar spine as well as ankylosis of the sacroiliac joints, in keeping with DISH. Heterotopic ossification of the left hemipelvis. IMPRESSION: 1. No acute aortic pathology. 2. Aneurysm of the infrarenal abdominal aorta measuring up to 3.2 x 2.8 cm in caliber. Recommend follow-up ultrasound every 3 years if not otherwise imaged. 3. Normal contour and caliber of the thoracic aorta. No evidence of thoracic aortic aneurysm or dissection. 4. Minimal emphysema and mild diffuse bilateral bronchial wall thickening. Smoking-related respiratory bronchiolitis. 5. Mildly coarse contour of the liver, suggestive of cirrhosis. Correlate with biochemical findings. 6. Prostatomegaly. Aortic Atherosclerosis (ICD10-I70.0) and Emphysema (ICD10-J43.9). Electronically Signed   By: Jearld Lesch M.D.   On: 01/31/2024 11:22   DG Chest 2 View Result Date: 01/31/2024 CLINICAL DATA:  Chest pain EXAM: CHEST - 2 VIEW COMPARISON:  Chest radiograph dated 06/13/2021. FINDINGS: The heart size and mediastinal contours are within normal limits. There is minimal bibasilar atelectasis/airspace disease. Trace pleural effusions are difficult to exclude. Degenerative changes are seen in the spine. IMPRESSION: Minimal bibasilar atelectasis/airspace disease. Electronically Signed   By: Romona Curls M.D.   On: 01/31/2024 09:34   EKG: Independently reviewed.  Sinus rhythm at 72 bpm.  Nonspecific T wave changes.  PVC noted.  Baseline artifact.  Nonspecific intraventricular conduction delay with QRS of 123.  Assessment/Plan Active Problems:   COPD (chronic obstructive pulmonary disease) (HCC)   Essential hypertension   Chronic diastolic CHF (congestive heart failure) (HCC)   Mixed hyperlipidemia   BPH (benign prostatic hyperplasia)   GERD (gastroesophageal reflux  disease)   Chest pain   Obesity (BMI 30-39.9)   Syncope   Syncope > Patient presenting after an episode of chest pain and syncope.  Chest pain dressed below. > Syncopal event occurred while patient was in a chair during his chest pain episode, lost consciousness for less than a minute per wife.  Patient did not remember  the event. > Preceded by this chest pain episode, with associated diaphoresis, shortness breath, nausea.  Possible vasovagal versus cardiogenic. > Cardiology consulted for chest pain as below and are following and patient will likely get a longer-term cardiac monitor on discharge. - Monitor on telemetry - Appreciate cardiology recommendations and assistance - Echocardiogram - Orthostatic vital signs - Supportive care  Chest pain > As above had episode of chest pain occurred while shaving lasted up to 2 hours no improvement with aspirin and nitroglycerin.  Troponin negative x 2.  No acute pathology on CT chest abdomen pelvis.  Was an infrarenal aneurysm which will need follow-up. > Cardiology consulted and are following.  Do not suspect cardiac etiology. - Appreciate cardiology recommendations and assistance - Care  Leukocytosis > No obvious evidence of infection at this point.  Likely reactive to above. - Continue to monitor/trend CBC  Hypertension - Replace home telmisartan with formulary irbesartan  Hyperlipidemia - Continue home rosuvastatin  GERD - Continue PPI  BPH - Continue home tamsulosin  COPD - Replace home Symbicort with formulary Dulera - Continue home as needed albuterol  Obesity - Noted  DVT prophylaxis: Lovenox Code Status:   Full Family Communication:  Updated at bedside Disposition Plan:   Patient is from:  Home  Anticipated DC to:  Home  Anticipated DC date:  1 to 2 days  Anticipated DC barriers: None  Consults called:  Cardiology Admission status:  Observation, telemetry  Severity of Illness: The appropriate patient status  for this patient is OBSERVATION. Observation status is judged to be reasonable and necessary in order to provide the required intensity of service to ensure the patient's safety. The patient's presenting symptoms, physical exam findings, and initial radiographic and laboratory data in the context of their medical condition is felt to place them at decreased risk for further clinical deterioration. Furthermore, it is anticipated that the patient will be medically stable for discharge from the hospital within 2 midnights of admission.    Synetta Fail MD [facility]  How to contact the [facility] Attending or Consulting provider 7A - 7P or covering provider during after hours 7P -7A, for this patient?   Check the care team in [facility] and look for a) attending/consulting [facility] provider listed and b) the [facility] team listed Log into [email or web address] and use [place]'s universal password to access. If you do not have the password, please contact the hospital operator. Locate the [facility] provider you are looking for under [facility] and page to a number that you can be directly reached. If you still have difficulty reaching the provider, please page the [facility] (Director on Call) for the Hospitalists listed on amion for assistance.  01/31/2024, 4:13 PM

## 2024-01-31 NOTE — ED Provider Notes (Signed)
 [place] [facility] AT [facility] Provider Note   CSN: [id number] Arrival date & time: 01/31/24  4098     History  Chief Complaint  Patient presents with   Chest Pain    Shane Clark is a 73 y.o. male.  73 year old male presents today for concern of chest pain.  This started 30 minutes prior to arrival.  It was associated with diaphoresis, dyspnea, and nausea.  He does report history of angina.  No previous stents.  Currently rates his pain as a 5/6 out of 10.  He was given aspirin and nitroglycerin en route.  He states this was not helpful to his pain.  He states he recently gotten up out of bed and was just finishing shaving when his chest pain came on.  The history is provided by the patient. No language interpreter was used.       Home Medications Prior to Admission medications   Medication Sig Start Date End Date Taking? Authorizing Provider  acetaminophen (TYLENOL) 500 MG tablet Take 1,000 mg by mouth every 6 (six) hours as needed for moderate pain.   Yes [provider]  albuterol (PROAIR HFA) 108 (90 Base) MCG/ACT inhaler Inhale 1-2 puffs into the lungs every 4 (four) hours as needed. Patient taking differently: Inhale 1-2 puffs into the lungs every 6 (six) hours as needed for wheezing or shortness of breath. 01/23/23  Yes   budesonide-formoterol (SYMBICORT) 80-4.5 MCG/ACT inhaler Inhale 2 puffs into the lungs 2 (two) times daily. 01/20/24  Yes   loratadine (CLARITIN) 10 MG tablet Take 10 mg by mouth daily.   Yes [provider]  Multiple Vitamins-Minerals (ZINC PO) Take 1 tablet by mouth daily.   Yes [provider]  nitroGLYCERIN (NITROSTAT) 0.4 MG SL tablet Place 1 tablet (0.4 mg total) under the tongue as needed. 01/20/24  Yes Sparks, Marcello Moores, DO  pantoprazole (PROTONIX) 40 MG tablet Take 1 tablet (40 mg total) by mouth daily for acid reflux. 01/23/23  Yes   rosuvastatin (CRESTOR) 5 MG tablet Take 1 tablet (5 mg total) by  mouth daily for cholesterol. 01/20/24  Yes   tamsulosin (FLOMAX) 0.4 MG CAPS capsule Take 1 capsule (0.4 mg total) by mouth daily, 30 minutes after the same meal each day for urinary symptoms 01/20/24  Yes   telmisartan (MICARDIS) 20 MG tablet Take 1 tablet (20 mg total) by mouth daily for blood pressure. 01/20/24  Yes   albuterol (PROAIR HFA) 108 (90 Base) MCG/ACT inhaler Inhale 1-2 puffs into the lungs every 4 (four) hours as needed. Patient not taking: Reported on 01/31/2024 01/20/24     nitroGLYCERIN (NITROSTAT) 0.4 MG SL tablet Place 1 tablet (0.4 mg total) under the tongue as needed. Patient not taking: Reported on 01/31/2024 07/16/21   Pricilla Riffle, MD  pantoprazole (PROTONIX) 40 MG tablet Take 1 tablet (40 mg total) by mouth daily for acid reflux. Patient not taking: Reported on 01/31/2024 01/20/24     rosuvastatin (CRESTOR) 5 MG tablet Take 1 tablet (5 mg total) by mouth daily. Patient not taking: Reported on 01/31/2024 09/23/23   Pricilla Riffle, MD  tamsulosin [facility]) 0.4 MG CAPS capsule Take 1 capsule (0.4 mg total) by mouth daily, 30 minutes after the same meal each day for urinary symptoms Patient not taking: Reported on 01/31/2024 01/23/23     telmisartan (MICARDIS) 20 MG tablet Take 1 tablet (20 mg total) by mouth daily for blood pressure Patient not taking: Reported on 01/31/2024  01/23/23         Allergies    Codeine, Percocet [oxycodone-acetaminophen], Angiotensin receptor blockers, Crestor [rosuvastatin], Lyrica [pregabalin], Mevacor [lovastatin], Pravachol [pravastatin], Talwin [pentazocine], Zestril [lisinopril], Zetia [ezetimibe], and Advair diskus [fluticasone-salmeterol]    Review of Systems   Review of Systems  Constitutional:  Negative for chills and fever.  Respiratory:  Positive for shortness of breath. Negative for cough.   Cardiovascular:  Positive for chest pain.  Neurological:  Negative for light-headedness.  All other systems reviewed and are negative.   Physical  Exam Updated Vital Signs BP (!) 116/58   Pulse 64   Temp 97.6 F (36.4 C) (Oral)   Resp 18   Ht 5\' 9"  (1.753 m)   Wt 112.5 kg   SpO2 97%   BMI 36.62 kg/m  Physical Exam Vitals and nursing note reviewed.  Constitutional:      General: He is not in acute distress.    Appearance: Normal appearance. He is not ill-appearing.  HENT:     Head: Normocephalic and atraumatic.     Nose: Nose normal.  Eyes:     Conjunctiva/sclera: Conjunctivae normal.  Cardiovascular:     Rate and Rhythm: Normal rate and regular rhythm.  Pulmonary:     Effort: Pulmonary effort is normal. No respiratory distress.     Breath sounds: Normal breath sounds. No wheezing.  Abdominal:     General: There is no distension.     Palpations: Abdomen is soft.     Tenderness: There is no abdominal tenderness.  Musculoskeletal:        General: No deformity. Normal range of motion.     Cervical back: Normal range of motion.     Right lower leg: No edema.     Left lower leg: No edema.  Skin:    Findings: No rash.  Neurological:     Mental Status: He is alert.     ED Results / Procedures / Treatments   Labs (all labs ordered are listed, but only abnormal results are displayed) Labs Reviewed  BASIC METABOLIC PANEL - Abnormal; Notable for the following components:      Result Value   Sodium 134 (*)    Glucose, Bld 131 (*)    All other components within normal limits  CBC - Abnormal; Notable for the following components:   WBC 13.6 (*)    All other components within normal limits  TROPONIN I (HIGH SENSITIVITY)  TROPONIN I (HIGH SENSITIVITY)    EKG EKG Interpretation Date/Time:  Sunday January 31 2024 08:34:05 EST Ventricular Rate:  72 PR Interval:  149 QRS Duration:  123 QT Interval:  396 QTC Calculation: 434 R Axis:   93  Text Interpretation: Sinus rhythm Ventricular premature complex Nonspecific intraventricular conduction delay PVCs are new Confirmed by Jacalyn Lefevre [phone]) on 01/31/2024 8:39:48  AM  Radiology DG Chest 2 View Result Date: 01/31/2024 CLINICAL DATA:  Chest pain EXAM: CHEST - 2 VIEW COMPARISON:  Chest radiograph dated 06/13/2021. FINDINGS: The heart size and mediastinal contours are within normal limits. There is minimal bibasilar atelectasis/airspace disease. Trace pleural effusions are difficult to exclude. Degenerative changes are seen in the spine. IMPRESSION: Minimal bibasilar atelectasis/airspace disease. Electronically Signed   By: Romona Curls M.D.   On: 01/31/2024 09:34    Procedures Procedures    Medications Ordered in ED Medications  lactated ringers bolus 1,000 mL (1,000 mLs Intravenous New Bag/Given 01/31/24 0859)  nitroGLYCERIN (NITROGLYN) 2 % ointment 1 inch (1 inch Topical Given  01/31/24 0859)  sodium chloride 0.9 % bolus 1,000 mL (1,000 mLs Intravenous New Bag/Given 01/31/24 0957)    ED Course/ Medical Decision Making/ A&P Clinical Course as of 01/31/24 1510  Sun Jan 31, 2024  1300 Discussed with cardiologist.  They will evaluate patient and provide additional recommendations.  He does not feel that he needs admission at this time. [AA]    Clinical Course User Index [AA] Marita Kansas, PA-C                                 Medical Decision Making Amount and/or Complexity of Data Reviewed Labs: ordered. Radiology: ordered.  Risk Prescription drug management.   Medical Decision Making / ED Course   This patient presents to the ED for concern of chest pain, this involves an extensive number of treatment options, and is a complaint that carries with it a high risk of complications and morbidity.  The differential diagnosis includes ACS, PE, dissection, pneumonia, MSK pain, GERD  MDM: 73 year old male presents today for complaints of chest pain with associated dyspnea, diaphoresis, and nausea.   Soft blood pressures on arrival to ED.  Will provide Nitropaste and a fluid bolus.  Remains with soft BP on the right arm.  Blood pressure on the  contralateral side is 116.  Pulses stronger on the left.  Will add on dissection study. CBC with mild leukocytosis.  Normal hemoglobin.  BMP with glucose of 131 otherwise without acute concern.  Initial troponin of 3.  Chest x-ray without acute cardiopulmonary process.  EKG without acute ischemic change.  Does show PVCs.  Repeat troponin without significant change.  Dissection study negative. Due to concerning story of his chest pain discussed with cardiology.  They believe he may be able to follow-up outpatient.  They will evaluate and provide recommendations.  Pending cardiology recommendations at the end my shift.  Signed out to oncoming provider at the end of my shift.   Lab Tests: -I ordered, reviewed, and interpreted labs.   The pertinent results include:   Labs Reviewed  BASIC METABOLIC PANEL - Abnormal; Notable for the following components:      Result Value   Sodium 134 (*)    Glucose, Bld 131 (*)    All other components within normal limits  CBC - Abnormal; Notable for the following components:   WBC 13.6 (*)    All other components within normal limits  TROPONIN I (HIGH SENSITIVITY)  TROPONIN I (HIGH SENSITIVITY)      EKG  EKG Interpretation Date/Time:  Sunday January 31 2024 08:34:05 EST Ventricular Rate:  72 PR Interval:  149 QRS Duration:  123 QT Interval:  396 QTC Calculation: 434 R Axis:   93  Text Interpretation: Sinus rhythm Ventricular premature complex Nonspecific intraventricular conduction delay PVCs are new Confirmed by Jacalyn Lefevre [phone]) on 01/31/2024 8:39:48 AM         Imaging Studies ordered: I ordered imaging studies including cxr, ct dissection study I independently visualized and interpreted imaging. I agree with the radiologist interpretation   Medicines ordered and prescription drug management: Meds ordered this encounter  Medications   DISCONTD: nitroGLYCERIN (NITROSTAT) SL tablet 0.4 mg   lactated ringers bolus 1,000 mL    nitroGLYCERIN (NITROGLYN) 2 % ointment 1 inch   sodium chloride 0.9 % bolus 1,000 mL    -I have reviewed the patients home medicines and have made adjustments as needed  Consultations  Obtained: I requested consultation with the cardiology,  and discussed lab and imaging findings as well as pertinent plan - they recommend: as above   Cardiac Monitoring: The patient was maintained on a cardiac monitor.  I personally viewed and interpreted the cardiac monitored which showed an underlying rhythm of: NSR   Reevaluation: After the interventions noted above, I reevaluated the patient and found that they have :improved  Co morbidities that complicate the patient evaluation  Past Medical History:  Diagnosis Date   Allergy    Arthritis    Asthma    BPH (benign prostatic hyperplasia)    Cataract    Chronic back pain    "all over"   COPD (chronic obstructive pulmonary disease) (HCC)    GERD (gastroesophageal reflux disease)    Hx of adenomatous colonic polyps 03/22/2016   Hypertension    Influenza 11/20/2011   Sepsis (HCC) 02/05/2016   Small bowel obstruction (HCC)       Dispostion: Signout to oncoming provider pending cardiology evaluation   Final Clinical Impression(s) / ED Diagnoses Final diagnoses:  None    Rx / DC Orders ED Discharge Orders     None         Marita Kansas, PA-C 01/31/24 1511    Jacalyn Lefevre, MD 02/01/24 1739

## 2024-01-31 NOTE — Consult Note (Addendum)
 Cardiology Consultation   Patient ID: Shane Clark MRN: [id number]; DOB: Jun 12, 1951  Admit date: 01/31/2024 Date of Consult: 01/31/2024  PCP:  Debroah Loop, DO   [facility] Providers Cardiologist:  [name], MD       Patient Profile:   Shane Clark is a 73 y.o. male with a hx of chest pain with minimal nonobstructive CAD on coronary CTA in 2022, asthma, chronic back pain, COPD, GERD, hypertension, hyperlipidemia who is being seen 01/31/2024 for the evaluation of chest pain at the request of Dr. Particia Nearing   History of Present Illness:   Shane Clark is a 73 year old male with above medical history.  He has been followed by Dr. Tenny Clark.  Patient has been worked up for chest pain on multiple different occasions.  Previously had a normal heart catheterization in 1998.  Nuclear stress test in 2007 and again 2009 were without ischemia.  Echocardiogram in 03/2015 showed EF 65-70%, no regional wall motion abnormalities, grade 1 DD.  Nuclear stress test in 03/2015 was a normal study.  More recently, patient underwent echocardiogram on 07/23/2021 that showed EF 65-70%, no regional wall motion abnormalities, normal RV function.  Coronary CTA on 07/26/2021 showed a coronary calcium score 53 (34th percentile), minimal nonobstructive CAD.   Patient presented to the ED on 3/2 complaining of chest pain and shortness of breath.  Patient had been coughing since the night prior.  EMS administered 1 SL nitroglycerin and 324 mg of aspirin without relief.  In the ED, initial vital signs showed heart rate 78 bpm, respirations 19 breaths/min, BP 99/62, oxygen 97% on room air.  High-sensitivity troponin was negative x 2.  K4.5, creatinine 1.16, WBC 13.6, hemoglobin 14.1, platelets 236.  Chest x-ray showed minimal bibasilar atelectasis/airspace disease.    On interview, patient reports that he has been in his usual state of health recently.  He denies recent issues with chest pain or shortness of breath.  This  morning, he woke up, drank a cup of coffee, took his medicines.  He sat down and studies his Bible for a bit before getting up to shave to get ready for church.  While shaving, he developed a stabbing pain in the middle of his chest.  This was associated with some shortness of breath, diaphoresis, as well as a episode of near syncope.  He took sublingual nitroglycerin but did not have any improvement in pain.  His wife was a Engineer, civil (consulting) and checked his blood pressure, BP was very low.  Decided to call EMS.  With EMS, he was given 324 mg of aspirin as well as more sublingual nitroglycerin.  Again, did not have any improvement in pain.  Currently, the pain has worn off.  He denies ever having the symptoms before.  He denies recent episodes of palpitations  Past Medical History:  Diagnosis Date   Allergy    Arthritis    Asthma    BPH (benign prostatic hyperplasia)    Cataract    Chronic back pain    "all over"   COPD (chronic obstructive pulmonary disease) (HCC)    GERD (gastroesophageal reflux disease)    Hx of adenomatous colonic polyps 03/22/2016   Hypertension    Influenza 11/20/2011   Sepsis (HCC) 02/05/2016   Small bowel obstruction (HCC)     Past Surgical History:  Procedure Laterality Date   ABDOMINAL HERNIA REPAIR     ANTERIOR CERVICAL DECOMP/DISCECTOMY FUSION     C6-8   APPENDECTOMY  1960s  BACK SURGERY     COLONOSCOPY     HERNIA REPAIR     INSERTION DISTRACTION DEVICE LUMBAR POSTERIOR SPINOUS PROCESS     KNEE ARTHROSCOPY Left    SHOULDER SURGERY Left 02/11/2017   SMALL BOWEL REPAIR        Inpatient Medications: Scheduled Meds:  Continuous Infusions:  PRN Meds:   Allergies:    Allergies  Allergen Reactions   Codeine Shortness Of Breath   Percocet [Oxycodone-Acetaminophen] Anaphylaxis   Angiotensin Receptor Blockers Other (See Comments)    Dyspnea   Crestor [Rosuvastatin]     Other reaction(s): Feet Hurt   Lyrica [Pregabalin] Other (See Comments)    Headache    Mevacor [Lovastatin]     Joint Pain   Pravachol [Pravastatin] Other (See Comments)   Talwin [Pentazocine] Nausea Only and Other (See Comments)    Stopped breathing   Zestril [Lisinopril] Other (See Comments)    Dyspnea   Zetia [Ezetimibe] Itching   Advair Diskus [Fluticasone-Salmeterol] Rash    unknown    Social History:   Social History   Socioeconomic History   Marital status: Married    Spouse name: Not on file   Number of children: Not on file   Years of education: Not on file   Highest education level: Not on file  Occupational History   Not on file  Tobacco Use   Smoking status: Former    Current packs/day: 0.12    Average packs/day: 0.1 packs/day for 20.0 years (2.4 ttl pk-yrs)    Types: Cigarettes   Smokeless tobacco: Former   Tobacco comments:    "quit smoking cigarettes in the 1980s"  Substance and Sexual Activity   Alcohol use: No    Alcohol/week: 0.0 standard drinks of alcohol   Drug use: No   Sexual activity: Yes  Other Topics Concern   Not on file  Social History Narrative   Not on file   Social Drivers of Health   Financial Resource Strain: Not on file  Food Insecurity: No Food Insecurity (04/29/2023)   Hunger Vital Sign    Worried About Running Out of Food in the Last Year: Never true    Ran Out of Food in the Last Year: Never true  Transportation Needs: No Transportation Needs (04/29/2023)   PRAPARE - Administrator, Civil Service (Medical): No    Lack of Transportation (Non-Medical): No  Physical Activity: Not on file  Stress: Not on file  Social Connections: Not on file  Intimate Partner Violence: Not At Risk (04/29/2023)   Humiliation, Afraid, Rape, and Kick questionnaire    Fear of Current or Ex-Partner: No    Emotionally Abused: No    Physically Abused: No    Sexually Abused: No    Family History:    Family History  Problem Relation Age of Onset   Dementia Mother    Coronary artery disease Father    Pancreatic cancer  Brother    Stroke Brother    Heart disease Brother    Diabetes type II Other    Colon cancer Neg Hx    Colon polyps Neg Hx    Rectal cancer Neg Hx    Stomach cancer Neg Hx    Esophageal cancer Neg Hx      ROS:  Please see the history of present illness.   All other ROS reviewed and negative.     Physical Exam/Data:   Vitals:   01/31/24 0945 01/31/24 1030 01/31/24 1243 01/31/24 1245  BP: (!) 116/58 (!) 92/58 112/65 116/72  Pulse: 64 66 64 65  Resp: 18 18 13 17   Temp:   97.6 F (36.4 C)   TempSrc:   Oral   SpO2: 97% 97% 96% 96%  Weight:      Height:       No intake or output data in the 24 hours ending 01/31/24 1328    01/31/2024    8:36 AM 07/17/2023    9:11 AM 06/11/2023    9:58 AM  Last 3 Weights  Weight (lbs) 248 lb 245 lb 245 lb  Weight (kg) 112.492 kg 111.131 kg 111.131 kg     Body mass index is 36.62 kg/m.  General:  Well nourished, well developed, in no acute distress. Sitting upright in the bed HEENT: normal Neck: no JVD Vascular: Radial pulses 2+ bilaterally Cardiac:  normal S1, S2; RRR; no murmur   Lungs:  clear to auscultation bilaterally, no wheezing, rhonchi or rales. Normal WOB on room air  Abd: soft, nontender  Ext: no edema in BLE  Musculoskeletal:  No deformities  Skin: warm and dry  Neuro:  CNs 2-12 intact, no focal abnormalities noted Psych:  Normal affect   EKG:  The EKG was personally reviewed and demonstrates:  EKG showed NSR with a PVC, HR 72 Bpm  Telemetry:  Telemetry was personally reviewed and demonstrates:  NSR, one episode of NSVT that lasted 7 beats   Relevant CV Studies:   Laboratory Data:  High Sensitivity Troponin:   Recent Labs  Lab 01/31/24 0830 01/31/24 1120  TROPONINIHS 3 2     Chemistry Recent Labs  Lab 01/31/24 0830  NA 134*  K 4.5  CL 101  CO2 22  GLUCOSE 131*  BUN 17  CREATININE 1.16  CALCIUM 9.2  GFRNONAA >60  ANIONGAP 11    No results for input(s): "PROT", "ALBUMIN", "AST", "ALT", "ALKPHOS",  "BILITOT" in the last 168 hours. Lipids No results for input(s): "CHOL", "TRIG", "HDL", "LABVLDL", "LDLCALC", "CHOLHDL" in the last 168 hours.  Hematology Recent Labs  Lab 01/31/24 0830  WBC 13.6*  RBC 4.26  HGB 14.1  HCT 40.8  MCV 95.8  MCH 33.1  MCHC 34.6  RDW 13.0  PLT 236   Thyroid No results for input(s): "TSH", "FREET4" in the last 168 hours.  BNPNo results for input(s): "BNP", "PROBNP" in the last 168 hours.  DDimer No results for input(s): "DDIMER" in the last 168 hours.   Radiology/Studies:  CT Angio Chest/Abd/Pel for Dissection W and/or Wo Contrast Result Date: 01/31/2024 CLINICAL DATA:  Chest pain, acute aortic syndrome suspected EXAM: CT ANGIOGRAPHY CHEST, ABDOMEN AND PELVIS TECHNIQUE: Non-contrast CT of the chest was initially obtained. Multidetector CT imaging through the chest, abdomen and pelvis was performed using the standard protocol during bolus administration of intravenous contrast. Multiplanar reconstructed images and MIPs were obtained and reviewed to evaluate the vascular anatomy. RADIATION DOSE REDUCTION: This exam was performed according to the departmental dose-optimization program which includes automated exposure control, adjustment of the mA and/or kV according to patient size and/or use of iterative reconstruction technique. CONTRAST:  75mL OMNIPAQUE IOHEXOL 350 MG/ML SOLN COMPARISON:  CT abdomen pelvis, 04/28/2023 FINDINGS: CTA CHEST FINDINGS VASCULAR Aorta: Satisfactory opacification of the aorta. Normal contour and caliber of the thoracic aorta. No evidence of aneurysm, dissection, or other acute aortic pathology. Mild mixed calcific atherosclerosis. Cardiovascular: No evidence of pulmonary embolism on limited non-tailored examination. Normal heart size. No pericardial effusion. Review of the MIP images confirms  the above findings. NON VASCULAR Mediastinum/Nodes: No enlarged mediastinal, hilar, or axillary lymph nodes. Thyroid gland, trachea, and esophagus  demonstrate no significant findings. Lungs/Pleura: Bibasilar scarring or atelectasis with elevation of the hemidiaphragms. Mild diffuse bilateral bronchial wall thickening. Minimal paraseptal emphysema and a background of fine centrilobular nodularity, most concentrated in the lung apices. No pleural effusion or pneumothorax. Musculoskeletal: No chest wall abnormality. No acute osseous findings. Review of the MIP images confirms the above findings. CTA ABDOMEN AND PELVIS FINDINGS VASCULAR Aneurysm of the infrarenal abdominal aorta measuring up to 3.2 x 2.8 cm in caliber (series 5, image 201, series 9, image 115). No evidence of dissection or other acute aortic pathology. 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 Review of the MIP images confirms the above findings. NON-VASCULAR Hepatobiliary: No solid liver abnormality is seen. Mildly coarse contour of the liver. No gallstones, gallbladder wall thickening, or biliary dilatation. Pancreas: Unremarkable. No pancreatic ductal dilatation or surrounding inflammatory changes. Spleen: Normal in size without significant abnormality. Adrenals/Urinary Tract: Adrenal glands are unremarkable. Kidneys are normal, without renal calculi, solid lesion, or hydronephrosis. Bladder is unremarkable. Stomach/Bowel: Stomach is within normal limits. Appendix not clearly visualized. No evidence of bowel wall thickening, distention, or inflammatory changes. Lymphatic: No enlarged abdominal or pelvic lymph nodes. Reproductive: Prostatomegaly. Other: No abdominal wall hernia or abnormality. No ascites. Musculoskeletal: No acute osseous findings. Disc degenerative disease and bridging osteophytosis throughout the thoracic and lumbar spine as well as ankylosis of the sacroiliac joints, in keeping with DISH. Heterotopic ossification of the left hemipelvis. IMPRESSION: 1. No acute aortic pathology. 2.  Aneurysm of the infrarenal abdominal aorta measuring up to 3.2 x 2.8 cm in caliber. Recommend follow-up ultrasound every 3 years if not otherwise imaged. 3. Normal contour and caliber of the thoracic aorta. No evidence of thoracic aortic aneurysm or dissection. 4. Minimal emphysema and mild diffuse bilateral bronchial wall thickening. Smoking-related respiratory bronchiolitis. 5. Mildly coarse contour of the liver, suggestive of cirrhosis. Correlate with biochemical findings. 6. Prostatomegaly. Aortic Atherosclerosis (ICD10-I70.0) and Emphysema (ICD10-J43.9). Electronically Signed   By: Jearld Lesch M.D.   On: 01/31/2024 11:22   DG Chest 2 View Result Date: 01/31/2024 CLINICAL DATA:  Chest pain EXAM: CHEST - 2 VIEW COMPARISON:  Chest radiograph dated 06/13/2021. FINDINGS: The heart size and mediastinal contours are within normal limits. There is minimal bibasilar atelectasis/airspace disease. Trace pleural effusions are difficult to exclude. Degenerative changes are seen in the spine. IMPRESSION: Minimal bibasilar atelectasis/airspace disease. Electronically Signed   By: Romona Curls M.D.   On: 01/31/2024 09:34     Assessment and Plan:   Chest Pain  Syncope  - Patient previously had a coronary CTA in 07/2021 that showed minimal nonobstructive CAD. Echocardiogram at that time showed EF 65-70%, no regional wall motion abnormalities, normal RV systolic function, no significant valvular abnormalities  - Patient was in his usual state of health until this AM. He drank coffee and went to shave. Developed sharp pain in the middle of his chest. Also became diaphoretic. He is not sure, but his wife believes that he may have lost consciousness. Chest pain was not relieved by ASA or SL nitroglycerin  - hsTn negative x2. EKG without ischemic changes  - With patient's episode of possible syncope, recommend inpatient echocardiogram - Continue to monitor heart rate and rhythm on telemetry. Anticipate DC with  outpatient monitor   Aneurysm of the Infrarenal Abdominal Aorta  -  CTA chest/abd/pelvis showed an aneurysm of the infrarenal abdominal aorta measuring up to 3.2x2.8 cm in caliber - Recommend follow up ultrasound every 3 years   HLD  - Lipid panel from 08/2021 showed LDL 36, HDL 39, triglycerides 44, total cholesterol 87  - Continue home statin therapy   Risk Assessment/Risk Scores:   For questions or updates, please contact [facility] Please consult [email or web address] for contact info under    Signed, Jonita Albee, PA-C  01/31/2024 1:28 PM  Patient seen and examined.  Agree with above documentation.  Shane Clark is a 73 year old male with a history of nonobstructive CAD, COPD, hypertension, hyperlipidemia who we are consulted for evaluation of chest pain at the request of Dr. Particia Nearing.  He reports onset of sharp stabbing pain in center of chest this morning.  Reports lasted for about 2 hours.  States that pain was continuous.  His wife reports that during this time he was sitting in his chair and suddenly lost consciousness.  States that he was unconscious for about a minute.  Patient does not remember this.  He currently denies any chest pain or dyspnea.  He underwent a coronary CTA in 2022 which showed minimal nonobstructive CAD.  Vital signs notable for BP 99/62, pulse 98, SpO2 97% on room air.  Labs notable for creatinine 1.16, troponin 3 > 2, WBC 13.6.  CTA chest abdomen pelvis showed infrarenal abdominal aorta aneurysm, no acute aortic pathology.  EKG shows sinus rhythm, rate 72, PVC, no ST abnormalities.  On exam, patient is alert and oriented, regular rate and rhythm, no murmurs, lungs CTAB, no LE edema or JVD.  For his chest pain, given continuous pain with negative troponins, suspect noncardiac pain.  For his syncope, recommend checking echocardiogram to evaluate for structural heart disease.  Will monitor on telemetry, anticipate cardiac monitor on discharge.  Little Ishikawa, MD

## 2024-02-01 ENCOUNTER — Other Ambulatory Visit (HOSPITAL_COMMUNITY): Payer: Self-pay

## 2024-02-01 ENCOUNTER — Observation Stay (HOSPITAL_BASED_OUTPATIENT_CLINIC_OR_DEPARTMENT_OTHER)

## 2024-02-01 DIAGNOSIS — R55 Syncope and collapse: Secondary | ICD-10-CM | POA: Diagnosis not present

## 2024-02-01 DIAGNOSIS — R079 Chest pain, unspecified: Secondary | ICD-10-CM | POA: Diagnosis not present

## 2024-02-01 DIAGNOSIS — I10 Essential (primary) hypertension: Secondary | ICD-10-CM | POA: Diagnosis not present

## 2024-02-01 LAB — CBC
HCT: 39.2 % (ref 39.0–52.0)
Hemoglobin: 13.5 g/dL (ref 13.0–17.0)
MCH: 32.8 pg (ref 26.0–34.0)
MCHC: 34.4 g/dL (ref 30.0–36.0)
MCV: 95.4 fL (ref 80.0–100.0)
Platelets: 218 10*3/uL (ref 150–400)
RBC: 4.11 MIL/uL — ABNORMAL LOW (ref 4.22–5.81)
RDW: 12.9 % (ref 11.5–15.5)
WBC: 11.5 10*3/uL — ABNORMAL HIGH (ref 4.0–10.5)
nRBC: 0 % (ref 0.0–0.2)

## 2024-02-01 LAB — ECHOCARDIOGRAM COMPLETE
Area-P 1/2: 2.91 cm2
Calc EF: 69.2 %
Height: 69 in
S' Lateral: 3.3 cm
Single Plane A2C EF: 62.7 %
Single Plane A4C EF: 75.8 %
Weight: 3968 [oz_av]

## 2024-02-01 LAB — GLUCOSE, CAPILLARY: Glucose-Capillary: 88 mg/dL (ref 70–99)

## 2024-02-01 LAB — COMPREHENSIVE METABOLIC PANEL
ALT: 17 U/L (ref 0–44)
AST: 18 U/L (ref 15–41)
Albumin: 3.6 g/dL (ref 3.5–5.0)
Alkaline Phosphatase: 65 U/L (ref 38–126)
Anion gap: 13 (ref 5–15)
BUN: 15 mg/dL (ref 8–23)
CO2: 23 mmol/L (ref 22–32)
Calcium: 9.2 mg/dL (ref 8.9–10.3)
Chloride: 101 mmol/L (ref 98–111)
Creatinine, Ser: 1.08 mg/dL (ref 0.61–1.24)
GFR, Estimated: >60 mL/min (ref >60)
Glucose, Bld: 88 mg/dL (ref 70–99)
Potassium: 4.5 mmol/L (ref 3.5–5.1)
Sodium: 137 mmol/L (ref 135–145)
Total Bilirubin: 1.2 mg/dL (ref 0.0–1.2)
Total Protein: 7 g/dL (ref 6.5–8.1)

## 2024-02-01 MED ORDER — POLYETHYLENE GLYCOL 3350 17 G PO PACK
17.0000 g | PACK | Freq: Every day | ORAL | 0 refills | Status: DC | PRN
  Filled 2024-02-01: qty 14, 14d supply, fill #0

## 2024-02-01 MED ORDER — PERFLUTREN LIPID MICROSPHERE
1.0000 mL | INTRAVENOUS | Status: AC | PRN
  Administered 2024-02-01: 1 mL via INTRAVENOUS

## 2024-02-01 NOTE — Care Management Obs Status (Cosign Needed)
 MEDICARE OBSERVATION STATUS NOTIFICATION   Patient Details  Name: Shane Clark MRN: [id number] Date of Birth: 1950/12/08   Medicare Observation Status Notification Given:  Yes    Janae Bridgeman, RN 02/01/2024, 4:39 PM

## 2024-02-01 NOTE — Progress Notes (Addendum)
 Rounding Note    Patient Name: Shane Clark Date of Encounter: 02/01/2024  [facility] Cardiologist: [name], MD   Subjective   No significant overnight events. No complaints today. No chest pain or shortness of breath. No recurrent diaphoresis or near syncope/ syncope.  Inpatient Medications    Scheduled Meds:  enoxaparin (LOVENOX) injection  40 mg Subcutaneous Q24H   irbesartan  75 mg Oral Daily   mometasone-formoterol  2 puff Inhalation BID   pantoprazole  40 mg Oral Daily   rosuvastatin  5 mg Oral Daily   sodium chloride flush  3 mL Intravenous Q12H   tamsulosin  0.4 mg Oral Daily   Continuous Infusions:  PRN Meds: acetaminophen **OR** acetaminophen, albuterol, polyethylene glycol   Vital Signs    Vitals:   01/31/24 1910 01/31/24 2013 02/01/24 0451 02/01/24 0834  BP: 121/70  134/69 128/62  Pulse: 63  91 93  Resp: 18  15   Temp: 98.1 F (36.7 C)  97.9 F (36.6 C) 98.1 F (36.7 C)  TempSrc: Oral  Oral Oral  SpO2: 97% 98% 93% 96%  Weight:      Height:       No intake or output data in the 24 hours ending 02/01/24 1242    01/31/2024    8:36 AM 07/17/2023    9:11 AM 06/11/2023    9:58 AM  Last 3 Weights  Weight (lbs) 248 lb 245 lb 245 lb  Weight (kg) 112.492 kg 111.131 kg 111.131 kg      Telemetry    Sinus rhythm with rates in the 60s. Occasional PVCs. - Personally Reviewed  ECG    No new ECG tracing today. - Personally Reviewed  Physical Exam   GEN: Caucasian male resting comfortably in no acute distress.   Neck: No JVD. Cardiac: RRR. No murmurs, rubs, or gallops.  Respiratory: No increased work of breathing. Clear to auscultation bilaterally. MS: No lower extremity edema. No deformity. Skin: Warm and dry. Neuro:  No focal deficits.   Psych: Normal affect. Responds appropriately.  Labs    High Sensitivity Troponin:   Recent Labs  Lab 01/31/24 0830 01/31/24 1120  TROPONINIHS 3 2     Chemistry Recent Labs  Lab  01/31/24 0830 02/01/24 0712  NA 134* 137  K 4.5 4.5  CL 101 101  CO2 22 23  GLUCOSE 131* 88  BUN 17 15  CREATININE 1.16 1.08  CALCIUM 9.2 9.2  PROT  --  7.0  ALBUMIN  --  3.6  AST  --  18  ALT  --  17  ALKPHOS  --  65  BILITOT  --  1.2  GFRNONAA >60 >60  ANIONGAP 11 13    Lipids No results for input(s): "CHOL", "TRIG", "HDL", "LABVLDL", "LDLCALC", "CHOLHDL" in the last 168 hours.  Hematology Recent Labs  Lab 01/31/24 0830 02/01/24 0712  WBC 13.6* 11.5*  RBC 4.26 4.11*  HGB 14.1 13.5  HCT 40.8 39.2  MCV 95.8 95.4  MCH 33.1 32.8  MCHC 34.6 34.4  RDW 13.0 12.9  PLT 236 218   Thyroid No results for input(s): "TSH", "FREET4" in the last 168 hours.  BNPNo results for input(s): "BNP", "PROBNP" in the last 168 hours.  DDimer No results for input(s): "DDIMER" in the last 168 hours.   Radiology    ECHOCARDIOGRAM COMPLETE Result Date: 02/01/2024    ECHOCARDIOGRAM REPORT   Patient Name:   VIVIAN OKELLEY Date of Exam: 02/01/2024 Medical  Rec #:  [id number]      Height:       69.0 in Accession #:    [id number]     Weight:       248.0 lb Date of Birth:  1951-06-04      BSA:          2.264 m Patient Age:    73 years       BP:           128/62 mmHg Patient Gender: M              HR:           86 bpm. Exam Location:  Inpatient Procedure: 2D Echo, Cardiac Doppler, Color Doppler and Intracardiac            Opacification Agent (Both Spectral and Color Flow Doppler were            utilized during procedure). Indications:    Syncope R55  History:        Patient has prior history of Echocardiogram examinations, most                 recent 07/23/2021. CHF, COPD, Signs/Symptoms:Chest Pain; Risk                 Factors:Hypertension.  Sonographer:    Webb Laws Referring Phys: [id number] ALEXANDER B MELVIN IMPRESSIONS  1. Left ventricular ejection fraction, by estimation, is 60 to 65%. The left ventricle has normal function. Left ventricular endocardial border not optimally defined to evaluate regional  wall motion. Left ventricular diastolic parameters are consistent with Grade I diastolic dysfunction (impaired relaxation).  2. Right ventricular systolic function is normal. The right ventricular size is normal.  3. The mitral valve was not well visualized. No evidence of mitral valve regurgitation. No evidence of mitral stenosis.  4. The aortic valve was not well visualized. Aortic valve regurgitation is not visualized. No aortic stenosis is present.  5. The inferior vena cava is normal in size with greater than 50% respiratory variability, suggesting right atrial pressure of 3 mmHg. Conclusion(s)/Recommendation(s): Technically very limited study due to poor sound wave transmission. LV and RV function are normal. FINDINGS  Left Ventricle: Left ventricular ejection fraction, by estimation, is 60 to 65%. The left ventricle has normal function. Left ventricular endocardial border not optimally defined to evaluate regional wall motion. The left ventricular internal cavity size was normal in size. There is no left ventricular hypertrophy. Left ventricular diastolic parameters are consistent with Grade I diastolic dysfunction (impaired relaxation). Right Ventricle: The right ventricular size is normal. No increase in right ventricular wall thickness. Right ventricular systolic function is normal. Left Atrium: Left atrial size was normal in size. Right Atrium: Right atrial size was normal in size. Pericardium: There is no evidence of pericardial effusion. Mitral Valve: The mitral valve was not well visualized. No evidence of mitral valve regurgitation. No evidence of mitral valve stenosis. Tricuspid Valve: The tricuspid valve is normal in structure. Tricuspid valve regurgitation is trivial. No evidence of tricuspid stenosis. Aortic Valve: The aortic valve was not well visualized. Aortic valve regurgitation is not visualized. No aortic stenosis is present. Pulmonic Valve: The pulmonic valve was not well visualized.  Pulmonic valve regurgitation is not visualized. No evidence of pulmonic stenosis. Aorta: The aortic root is normal in size and structure. Venous: The inferior vena cava is normal in size with greater than 50% respiratory variability, suggesting right atrial pressure of 3 mmHg. IAS/Shunts: No atrial  level shunt detected by color flow Doppler.  LEFT VENTRICLE PLAX 2D LVIDd:         4.20 cm      Diastology LVIDs:         3.30 cm      LV e' medial:    8.59 cm/s LV PW:         0.90 cm      LV E/e' medial:  8.2 LV IVS:        1.00 cm      LV e' lateral:   8.24 cm/s LVOT diam:     1.80 cm      LV E/e' lateral: 8.5 LV SV:         55 LV SV Index:   25 LVOT Area:     2.54 cm  LV Volumes (MOD) LV vol d, MOD A2C: 136.0 ml LV vol d, MOD A4C: 120.0 ml LV vol s, MOD A2C: 50.7 ml LV vol s, MOD A4C: 29.1 ml LV SV MOD A2C:     85.3 ml LV SV MOD A4C:     120.0 ml LV SV MOD BP:      89.0 ml RIGHT VENTRICLE RV Basal diam:  2.80 cm RV S prime:     15.90 cm/s TAPSE (M-mode): 2.8 cm LEFT ATRIUM           Index       RIGHT ATRIUM          Index LA diam:      2.70 cm 1.19 cm/m  RA Area:     9.33 cm LA Vol (A2C): 16.5 ml 7.29 ml/m  RA Volume:   18.00 ml 7.95 ml/m LA Vol (A4C): 15.7 ml 6.94 ml/m  AORTIC VALVE LVOT Vmax:   123.00 cm/s LVOT Vmean:  81.600 cm/s LVOT VTI:    0.218 m  AORTA Ao Asc diam: 3.30 cm MITRAL VALVE MV Area (PHT): 2.91 cm    SHUNTS MV Decel Time: 261 msec    Systemic VTI:  0.22 m MV E velocity: 70.10 cm/s  Systemic Diam: 1.80 cm MV A velocity: 72.30 cm/s MV E/A ratio:  0.97 Arvilla Meres MD Electronically signed by Arvilla Meres MD Signature Date/Time: 02/01/2024/10:27:44 AM    Final    CT Angio Chest/Abd/Pel for Dissection W and/or Wo Contrast Result Date: 01/31/2024 CLINICAL DATA:  Chest pain, acute aortic syndrome suspected EXAM: CT ANGIOGRAPHY CHEST, ABDOMEN AND PELVIS TECHNIQUE: Non-contrast CT of the chest was initially obtained. Multidetector CT imaging through the chest, abdomen and pelvis was performed  using the standard protocol during bolus administration of intravenous contrast. Multiplanar reconstructed images and MIPs were obtained and reviewed to evaluate the vascular anatomy. RADIATION DOSE REDUCTION: This exam was performed according to the departmental dose-optimization program which includes automated exposure control, adjustment of the mA and/or kV according to patient size and/or use of iterative reconstruction technique. CONTRAST:  75mL OMNIPAQUE IOHEXOL 350 MG/ML SOLN COMPARISON:  CT abdomen pelvis, 04/28/2023 FINDINGS: CTA CHEST FINDINGS VASCULAR Aorta: Satisfactory opacification of the aorta. Normal contour and caliber of the thoracic aorta. No evidence of aneurysm, dissection, or other acute aortic pathology. Mild mixed calcific atherosclerosis. Cardiovascular: No evidence of pulmonary embolism on limited non-tailored examination. Normal heart size. No pericardial effusion. Review of the MIP images confirms the above findings. NON VASCULAR Mediastinum/Nodes: No enlarged mediastinal, hilar, or axillary lymph nodes. Thyroid gland, trachea, and esophagus demonstrate no significant findings. Lungs/Pleura: Bibasilar scarring or atelectasis with elevation of the hemidiaphragms. Mild  diffuse bilateral bronchial wall thickening. Minimal paraseptal emphysema and a background of fine centrilobular nodularity, most concentrated in the lung apices. No pleural effusion or pneumothorax. Musculoskeletal: No chest wall abnormality. No acute osseous findings. Review of the MIP images confirms the above findings. CTA ABDOMEN AND PELVIS FINDINGS VASCULAR Aneurysm of the infrarenal abdominal aorta measuring up to 3.2 x 2.8 cm in caliber (series 5, image 201, series 9, image 115). No evidence of dissection or other acute aortic pathology. 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 Review of the MIP images confirms the above  findings. NON-VASCULAR Hepatobiliary: No solid liver abnormality is seen. Mildly coarse contour of the liver. No gallstones, gallbladder wall thickening, or biliary dilatation. Pancreas: Unremarkable. No pancreatic ductal dilatation or surrounding inflammatory changes. Spleen: Normal in size without significant abnormality. Adrenals/Urinary Tract: Adrenal glands are unremarkable. Kidneys are normal, without renal calculi, solid lesion, or hydronephrosis. Bladder is unremarkable. Stomach/Bowel: Stomach is within normal limits. Appendix not clearly visualized. No evidence of bowel wall thickening, distention, or inflammatory changes. Lymphatic: No enlarged abdominal or pelvic lymph nodes. Reproductive: Prostatomegaly. Other: No abdominal wall hernia or abnormality. No ascites. Musculoskeletal: No acute osseous findings. Disc degenerative disease and bridging osteophytosis throughout the thoracic and lumbar spine as well as ankylosis of the sacroiliac joints, in keeping with DISH. Heterotopic ossification of the left hemipelvis. IMPRESSION: 1. No acute aortic pathology. 2. Aneurysm of the infrarenal abdominal aorta measuring up to 3.2 x 2.8 cm in caliber. Recommend follow-up ultrasound every 3 years if not otherwise imaged. 3. Normal contour and caliber of the thoracic aorta. No evidence of thoracic aortic aneurysm or dissection. 4. Minimal emphysema and mild diffuse bilateral bronchial wall thickening. Smoking-related respiratory bronchiolitis. 5. Mildly coarse contour of the liver, suggestive of cirrhosis. Correlate with biochemical findings. 6. Prostatomegaly. Aortic Atherosclerosis (ICD10-I70.0) and Emphysema (ICD10-J43.9). Electronically Signed   By: Jearld Lesch M.D.   On: 01/31/2024 11:22   DG Chest 2 View Result Date: 01/31/2024 CLINICAL DATA:  Chest pain EXAM: CHEST - 2 VIEW COMPARISON:  Chest radiograph dated 06/13/2021. FINDINGS: The heart size and mediastinal contours are within normal limits. There is  minimal bibasilar atelectasis/airspace disease. Trace pleural effusions are difficult to exclude. Degenerative changes are seen in the spine. IMPRESSION: Minimal bibasilar atelectasis/airspace disease. Electronically Signed   By: Romona Curls M.D.   On: 01/31/2024 09:34    Cardiac Studies   Coronary CTA 07/26/2021: Impression: 1.  Minimal nonobstructive CAD, CADRADS = 1. 2. Coronary calcium score of 53. This was 34th percentile for age and sex matched control. 3. Normal coronary origin with right dominance. _______________  Echocardiogram 02/01/2024: 1. Left ventricular ejection fraction, by estimation, is 60 to 65%. The  left ventricle has normal function. Left ventricular endocardial border  not optimally defined to evaluate regional wall motion. Left ventricular  diastolic parameters are consistent  with Grade I diastolic dysfunction (impaired relaxation).   2. Right ventricular systolic function is normal. The right ventricular  size is normal.   3. The mitral valve was not well visualized. No evidence of mitral valve  regurgitation. No evidence of mitral stenosis.   4. The aortic valve was not well visualized. Aortic valve regurgitation  is not visualized. No aortic stenosis is present.   5. The inferior vena cava is normal in size with greater than 50%  respiratory variability, suggesting right atrial pressure of 3 mmHg.   Conclusion(s)/Recommendation(s): Technically very limited  study due to  poor sound wave transmission. LV and RV function are normal.   Patient Profile     73 y.o. male with a history of chest pain with minimal non-obstructive CAD noted on coronary CTA in 2022, hypertension, hyperlipidemia, COPD/ asthma, GERD, and chronic back pain who was admitted on 01/31/2024 for further evaluation of chest pain and near syncope.  Assessment & Plan    Chest Pain Minimal Non-Obstructive CAD Patient has a history of intermittent chest pain over the years and has had  multiple ischemic evaluations in the past. Most recent ischemic evaluation was a coronary CTA in 07/2021 which showed only minimal non-obstructive CAD. He presented with an episode of sharp pain in the middle of his chest with associated diaphoresis and near syncope. Chest pain was not relieved by Aspirin or sublingual Nitroglycerin. EKG shows no acute ischemic changes. High-sensitivity troponin negative x2. CTA negative for acute aortic pathology. Echo shows LVEF of 60-65% (endocardial border not optimally defined to evaluated for regional wall motion abnormalities) and no significant valvular disease. - No recurrent chest pain.  - Unclear what caused episode of chest pain yesterday but felt to be non-cardiac in nature. No additional ischemic work-up necessary.  Syncope In setting of chest pain as described above. Patient is not sure but wife, who is a former [employer] fundraiser and witnessed the event, states he lost consciousness for about 1 minute. Systolic BP in the 60s at that time. She was concerned that he may be having a seizure because of tongue biting. However, no seizure like activity or bowel/ bladder incontinence. Echo showed normal LV function and no significant valvular disease. - No significant arrhythmias noted on telemetry. - Will plan outpatient monitor at discharge. - Will defer to primary team on whether any evaluation for seizure is necessary. - [place] Law that recommends no driving for 6 months after a syncopal episode. However, patient was significantly hypotensive at the time of the event which could certainly explain his syncopal episode. Will discuss driving restrictions with MD.  Infrarenal Abdominal Aortic Aneursym CTA on admission showed an aneurysm of the infrarenal abdominal aorta measuring 3.2 x 2.8 cm.  - Can continue to monitor as an outpatient. Repeat ultrasound was recommended every 3 hours.   Hypertension BP initially soft with systolic BP in the 90s. BP improved today. -  Continue Irbesartan 75mg  daily (on Telmisartan at home).   Hyperlipidemia - Continue home Crestor 5mg  daily.  Otherwise, per primary team: - Leukocytosis - COPD/ asthma - GERD - Chronic back pain   For questions or updates, please contact [facility] Please consult [email or web address] for contact info under        Signed, Corrin Parker, PA-C  02/01/2024, 12:42 PM      Patient seen and examined. Agree with assessment and plan.  Patient currently feels well.  Patient denies any anginal type symptomatology.  Yesterday he had transient sharp pain in the middle of his chest.  Of note, the patient had taken his telmisartan 20 mg 1 hour previously.  His wife took his blood pressure and it had dropped down to 64/40 which may have resulted in his presyncopal/brief syncopal episode felt likely due to hypotension rather than arrhythmic in etiology.  Tells me, then in the past he had been taken off telmisartan when his blood pressure had gotten low but apparently this had been restarted.  Patient denied any sensation of tachycardia or pauses.  Doppler study shows normal LV function EF 60  to 65%.  ECG is unremarkable with isolated PVC.  OK to discharge today.  Plan to obtain 2-week cardiac monitor postdischarge.  At present, I advised patient to hold telmisartan.  He will need office visit in 1-2 weeks for reassessment of blood pressure and symptomatology.   Lennette Bihari, MD, [facility] 02/01/2024 2:36 PM

## 2024-02-01 NOTE — Progress Notes (Addendum)
 DISCHARGE NOTE HOME Shane Clark to be discharged Home per MD order. Discussed prescriptions and follow up appointments with the patient. Prescriptions given to patient; medication list explained in detail. Patient verbalized understanding.  Skin clean, dry and intact without evidence of skin break down, no evidence of skin tears noted. IV catheter discontinued intact. Site without signs and symptoms of complications. Dressing and pressure applied. Pt denies pain at the site currently. No complaints noted.  Patient free of lines, drains, and wounds.  Heart monitor being mailed to patient.  An After Visit Summary (AVS) was printed and given to the patient.  Patient to go to discharge lounge to wait for ride Patient will be escorted via wheelchair, and discharged home via private auto.  Velia Meyer, RN

## 2024-02-01 NOTE — Discharge Summary (Signed)
 Physician Discharge Summary  Patient ID: Shane Clark MRN: [id number] DOB/AGE: Jan 25, 1951 73 y.o.  Admit date: 01/31/2024 Discharge date: 02/01/2024  Admission Diagnoses:  Discharge Diagnoses:  Principal Problem:   Syncope and collapse Active Problems:   Chest pain   COPD (chronic obstructive pulmonary disease) (HCC)   BPH (benign prostatic hyperplasia)   Essential hypertension   Obesity (BMI 30-39.9)   GERD (gastroesophageal reflux disease)   Mixed hyperlipidemia   Chronic diastolic CHF (congestive heart failure) (HCC)   Syncope   Discharged Condition: stable  Hospital Course: Patient is a 73 year old male past medical history significant for hypertension, hyperlipidemia, GERD, BPH, COPD, chronic diastolic CHF and obesity.  Patient was admitted and managed for syncope and chest pain.  Cardiology team was consulted to assist with patient's management.  Troponin was negative x 2.  Chest x-ray revealed minimal atelectasis.  CTA chest, abdomen and pelvis revealed infrarenal aortic aneurysm measuring 3.2 x 2.8 cm (patient will need follow-up).  Patient has been cleared for discharge by the cardiology team.  Patient will follow-up primary care provider and cardiology team on discharge.  Syncope: Chest pain: -Negative troponin. -Cardiology consulted to assist with patient's management. -Patient has been cleared for discharge. -Holter monitoring on discharge. -Echo revealed normal left ventricular ejection fraction and grade 1 diastolic dysfunction.  Leukocytosis: -Likely reactive.  Hypertension -Continue to optimize.   Hyperlipidemia - Continue home rosuvastatin   GERD - Continue PPI   BPH - Continue home tamsulosin   COPD -Stable.   Obesity - Noted  Consults: cardiology  Significant Diagnostic Studies:  CT ANGIOGRAPHY CHEST, ABDOMEN AND PELVIS:   TECHNIQUE: Non-contrast CT of the chest was initially obtained.   Multidetector CT imaging through the chest,  abdomen and pelvis was performed using the standard protocol during bolus administration of intravenous contrast. Multiplanar reconstructed images and MIPs were obtained and reviewed to evaluate the vascular anatomy.   RADIATION DOSE REDUCTION: This exam was performed according to the departmental dose-optimization program which includes automated exposure control, adjustment of the mA and/or kV according to patient size and/or use of iterative reconstruction technique.   CONTRAST:  75mL OMNIPAQUE IOHEXOL 350 MG/ML SOLN   COMPARISON:  CT abdomen pelvis, 04/28/2023   FINDINGS: CTA CHEST FINDINGS   VASCULAR   Aorta: Satisfactory opacification of the aorta. Normal contour and caliber of the thoracic aorta. No evidence of aneurysm, dissection, or other acute aortic pathology. Mild mixed calcific atherosclerosis.   Cardiovascular: No evidence of pulmonary embolism on limited non-tailored examination. Normal heart size. No pericardial effusion.   Review of the MIP images confirms the above findings.   NON VASCULAR   Mediastinum/Nodes: No enlarged mediastinal, hilar, or axillary lymph nodes. Thyroid gland, trachea, and esophagus demonstrate no significant findings.   Lungs/Pleura: Bibasilar scarring or atelectasis with elevation of the hemidiaphragms. Mild diffuse bilateral bronchial wall thickening. Minimal paraseptal emphysema and a background of fine centrilobular nodularity, most concentrated in the lung apices. No pleural effusion or pneumothorax.   Musculoskeletal: No chest wall abnormality. No acute osseous findings.   Review of the MIP images confirms the above findings.   CTA ABDOMEN AND PELVIS FINDINGS   VASCULAR   Aneurysm of the infrarenal abdominal aorta measuring up to 3.2 x 2.8 cm in caliber (series 5, image 201, series 9, image 115). No evidence of dissection or other acute aortic pathology. Duplicated left renal arteries with a solitary right renal  artery and otherwise standard branching pattern of the abdominal aorta.   Review  of the MIP images confirms the above findings.   NON-VASCULAR   Hepatobiliary: No solid liver abnormality is seen. Mildly coarse contour of the liver. No gallstones, gallbladder wall thickening, or biliary dilatation.   Pancreas: Unremarkable. No pancreatic ductal dilatation or surrounding inflammatory changes.   Spleen: Normal in size without significant abnormality.   Adrenals/Urinary Tract: Adrenal glands are unremarkable. Kidneys are normal, without renal calculi, solid lesion, or hydronephrosis. Bladder is unremarkable.   Stomach/Bowel: Stomach is within normal limits. Appendix not clearly visualized. No evidence of bowel wall thickening, distention, or inflammatory changes.   Lymphatic: No enlarged abdominal or pelvic lymph nodes.   Reproductive: Prostatomegaly.   Other: No abdominal wall hernia or abnormality. No ascites.   Musculoskeletal: No acute osseous findings. Disc degenerative disease and bridging osteophytosis throughout the thoracic and lumbar spine as well as ankylosis of the sacroiliac joints, in keeping with DISH. Heterotopic ossification of the left hemipelvis.   IMPRESSION: 1. No acute aortic pathology. 2. Aneurysm of the infrarenal abdominal aorta measuring up to 3.2 x 2.8 cm in caliber. Recommend follow-up ultrasound every 3 years if not otherwise imaged. 3. Normal contour and caliber of the thoracic aorta. No evidence of thoracic aortic aneurysm or dissection. 4. Minimal emphysema and mild diffuse bilateral bronchial wall thickening. Smoking-related respiratory bronchiolitis. 5. Mildly coarse contour of the liver, suggestive of cirrhosis. Correlate with biochemical findings. 6. Prostatomegaly.   Aortic Atherosclerosis (ICD10-I70.0) and Emphysema (ICD10-J43.9).     Electronically Signed   By: Jearld Lesch M.D.   On: 01/31/2024 11:22    Echo revealed: 1.  Left ventricular ejection fraction, by estimation, is 60 to 65%. The  left ventricle has normal function. Left ventricular endocardial border  not optimally defined to evaluate regional wall motion. Left ventricular  diastolic parameters are consistent  with Grade I diastolic dysfunction (impaired relaxation).   2. Right ventricular systolic function is normal. The right ventricular  size is normal.   3. The mitral valve was not well visualized. No evidence of mitral valve  regurgitation. No evidence of mitral stenosis.   4. The aortic valve was not well visualized. Aortic valve regurgitation  is not visualized. No aortic stenosis is present.   5. The inferior vena cava is normal in size with greater than 50%  respiratory variability, suggesting right atrial pressure of 3 mmHg.   Conclusion(s)/Recommendation(s): Technically very limited study due to  poor sound wave transmission. LV and RV function are normal.      CHEST - 2 VIEW:   COMPARISON:  Chest radiograph dated 06/13/2021.   FINDINGS: The heart size and mediastinal contours are within normal limits. There is minimal bibasilar atelectasis/airspace disease. Trace pleural effusions are difficult to exclude. Degenerative changes are seen in the spine.   IMPRESSION: Minimal bibasilar atelectasis/airspace disease.     Electronically Signed   By: Romona Curls M.D.   On: 01/31/2024 09:34  Treatments: Holter monitor on discharge.  Patient will follow-up with cardiology on discharge.  Discharge Exam: Blood pressure 128/62, pulse 93, temperature 98.1 F (36.7 C), temperature source Oral, resp. rate 15, height 5\' 9"  (1.753 m), weight 112.5 kg, SpO2 96%.   Disposition: Discharge disposition: 01-Home or Self Care       Discharge Instructions     Diet - low sodium heart healthy   Complete by: As directed    Increase activity slowly   Complete by: As directed       Allergies as of 02/01/2024  Reactions    Codeine Shortness Of Breath   Percocet [oxycodone-acetaminophen] Anaphylaxis   Angiotensin Receptor Blockers Other (See Comments)   Dyspnea   Crestor [rosuvastatin]    Other reaction(s): Feet Hurt   Lyrica [pregabalin] Other (See Comments)   Headache   Mevacor [lovastatin]    Joint Pain   Pravachol [pravastatin] Other (See Comments)   Talwin [pentazocine] Nausea Only, Other (See Comments)   Stopped breathing   Zestril [lisinopril] Other (See Comments)   Dyspnea   Zetia [ezetimibe] Itching   Advair Diskus [fluticasone-salmeterol] Rash   unknown        Medication List     STOP taking these medications    loratadine 10 MG tablet Commonly known as: CLARITIN       TAKE these medications    acetaminophen 500 MG tablet Commonly known as: TYLENOL Take 1,000 mg by mouth every 6 (six) hours as needed for moderate pain.   albuterol 108 (90 Base) MCG/ACT inhaler Commonly known as: ProAir HFA Inhale 1-2 puffs into the lungs every 4 (four) hours as needed. What changed:  when to take this reasons to take this   nitroGLYCERIN 0.4 MG SL tablet Commonly known as: NITROSTAT Place 1 tablet (0.4 mg total) under the tongue as needed.   pantoprazole 40 MG tablet Commonly known as: Protonix Take 1 tablet (40 mg total) by mouth daily for acid reflux.   polyethylene glycol 17 g packet Commonly known as: MIRALAX / GLYCOLAX Take 17 g by mouth daily as needed for mild constipation.   rosuvastatin 5 MG tablet Commonly known as: CRESTOR Take 1 tablet (5 mg total) by mouth daily for cholesterol.   Symbicort 80-4.5 MCG/ACT inhaler Generic drug: budesonide-formoterol Inhale 2 puffs into the lungs 2 (two) times daily.   tamsulosin 0.4 MG Caps capsule Commonly known as: FLOMAX Take 1 capsule (0.4 mg total) by mouth daily, 30 minutes after the same meal each day for urinary symptoms   telmisartan 20 MG tablet Commonly known as: MICARDIS Take 1 tablet (20 mg total) by mouth daily  for blood pressure.   ZINC PO Take 1 tablet by mouth daily.       Time spent: 35 minutes.  SignedBarnetta Chapel 02/01/2024, 4:08 PM

## 2024-02-02 ENCOUNTER — Other Ambulatory Visit (HOSPITAL_COMMUNITY): Payer: Self-pay

## 2024-02-02 MED ORDER — PANTOPRAZOLE SODIUM 40 MG PO TBEC
40.0000 mg | DELAYED_RELEASE_TABLET | Freq: Every day | ORAL | 0 refills | Status: DC
  Filled 2024-02-02 – 2024-04-01 (×2): qty 100, 100d supply, fill #0

## 2024-02-04 ENCOUNTER — Ambulatory Visit: Attending: Student

## 2024-02-04 ENCOUNTER — Other Ambulatory Visit: Payer: Self-pay | Admitting: Student

## 2024-02-04 DIAGNOSIS — R55 Syncope and collapse: Secondary | ICD-10-CM

## 2024-02-04 NOTE — Progress Notes (Unsigned)
Enrolled for [employer] to mail a ZIO XT long term holter monitor to the patients address on file.   Dr. Ross to read. 

## 2024-02-04 NOTE — Progress Notes (Signed)
 Ordered 2 week Zio monitor for further evaluation of syncope. Please see rounding note from 02/01/2024 for more information.  Corrin Parker, PA-C 02/04/2024 11:03 AM

## 2024-02-07 NOTE — Progress Notes (Unsigned)
 Cardiology Office Note   Date:  02/07/2024   ID:  MARKCUS LAZENBY, DOB Aug 19, 1951, MRN [id number]  PCP:  Debroah Loop, DO  Cardiologist:   [name], MD   Pt presents for follow up of CP     History of Present Illness: Shane Clark is a 73 y.o. male with a history of chest pain  The pt has hs had several caths in past that showed no evidence of CAD Last cath in 1998   Myoview in 2007 was normal  IN June 2022 Episode of CP  CCTA  Ca score 53   Angiogram showed minimal CAD   I saw the pt in June 2023   Pt admitted for SBO in May 2024   Rx medically  Since seen the pt denies CP  Breathing is OK   Denies palpitations   Denies abdominal pain   I saw the pt in clinic in Aug 2024  No outpatient medications have been marked as taking for the 02/08/24 encounter (Appointment) with Pricilla Riffle, MD.     Allergies:   Codeine, Percocet [oxycodone-acetaminophen], Angiotensin receptor blockers, Crestor [rosuvastatin], Lyrica [pregabalin], Mevacor [lovastatin], Pravachol [pravastatin], Talwin [pentazocine], Zestril [lisinopril], Zetia [ezetimibe], and Advair diskus [fluticasone-salmeterol]   Past Medical History:  Diagnosis Date   Allergy    Arthritis    Asthma    BPH (benign prostatic hyperplasia)    Cataract    Chronic back pain    "all over"   COPD (chronic obstructive pulmonary disease) (HCC)    GERD (gastroesophageal reflux disease)    Hx of adenomatous colonic polyps 03/22/2016   Hypertension    Influenza 11/20/2011   Sepsis (HCC) 02/05/2016   Small bowel obstruction (HCC)     Past Surgical History:  Procedure Laterality Date   ABDOMINAL HERNIA REPAIR     ANTERIOR CERVICAL DECOMP/DISCECTOMY FUSION     C6-8   APPENDECTOMY  1960s   BACK SURGERY     COLONOSCOPY     HERNIA REPAIR     INSERTION DISTRACTION DEVICE LUMBAR POSTERIOR SPINOUS PROCESS     KNEE ARTHROSCOPY Left    SHOULDER SURGERY Left 02/11/2017   SMALL BOWEL REPAIR       Social History:  The patient   reports that he has quit smoking. His smoking use included cigarettes. He has a 2.4 pack-year smoking history. He has quit using smokeless tobacco. He reports that he does not drink alcohol and does not use drugs.   Family History:  The patient's family history includes Coronary artery disease in his father; Dementia in his mother; Diabetes type II in an other family member; Heart disease in his brother; Pancreatic cancer in his brother; Stroke in his brother.    ROS:  Please see the history of present illness. All other systems are reviewed and  Negative to the above problem except as noted.    PHYSICAL EXAM: VS:  There were no vitals taken for this visit.  GEN: Morbidly obese 73 yo  in no acute distress  HEENT: normal  Neck: no JVD, carotid bruit Cardiac: RRR; no murmur  No LE edema  Respiratory:  clear to auscultation  Chest  Nontender  GI: soft  Nontender  No masses   EKG:  EKG is not ordered today.   Lipid Panel    Component Value Date/Time   CHOL 87 (L) 09/16/2021 0718   TRIG 44 09/16/2021 0718   HDL 39 (L) 09/16/2021 0718   CHOLHDL 2.2 09/16/2021  0718   CHOLHDL 4.8 CALC 03/29/2008 1401   VLDL 12 03/29/2008 1401   LDLCALC 36 09/16/2021 0718      Wt Readings from Last 3 Encounters:  01/31/24 248 lb (112.5 kg)  07/17/23 245 lb (111.1 kg)  06/11/23 245 lb (111.1 kg)      ASSESSMENT AND PLAN:  1  chest pressure  Hx of CP in past   CCTA with minimal CAD   Pt denies symptoms  ? GI previously  2  CAD   Minimal on CT scan   Stop ASA  3  Hx Dizziness  Denies     4  HTN  Well controlled   5  HL  With risk factors, minimal CAD I would continue statin.   LDL 40     6  Diet    Limit carbs        Current medicines are reviewed at length with the patient today.  The patient does not have concerns regarding medicines.  Signed, [name], MD  02/07/2024 2:43 PM    [facility] [address] [place], [place], [place]  [address] Phone: [phone]; Fax:  [phone]

## 2024-02-08 ENCOUNTER — Ambulatory Visit: Attending: Internal Medicine | Admitting: Internal Medicine

## 2024-02-08 ENCOUNTER — Encounter: Payer: Self-pay | Admitting: Internal Medicine

## 2024-02-08 ENCOUNTER — Other Ambulatory Visit (HOSPITAL_COMMUNITY): Payer: Self-pay

## 2024-02-08 VITALS — BP 102/64 | HR 97 | Ht 69.0 in | Wt 247.0 lb

## 2024-02-08 DIAGNOSIS — R079 Chest pain, unspecified: Secondary | ICD-10-CM

## 2024-02-08 MED ORDER — IPRATROPIUM BROMIDE 0.03 % NA SOLN
2.0000 | Freq: Two times a day (BID) | NASAL | 1 refills | Status: DC
Start: 2024-02-08 — End: 2024-08-05
  Filled 2024-02-08: qty 30, 75d supply, fill #0
  Filled 2024-05-10: qty 30, 75d supply, fill #1

## 2024-02-08 MED ORDER — AMOXICILLIN-POT CLAVULANATE 875-125 MG PO TABS
1.0000 | ORAL_TABLET | Freq: Two times a day (BID) | ORAL | 0 refills | Status: AC
  Filled 2024-02-08: qty 20, 10d supply, fill #0

## 2024-02-08 MED ORDER — PROMETHAZINE-DM 6.25-15 MG/5ML PO SYRP
5.0000 mL | ORAL_SOLUTION | Freq: Four times a day (QID) | ORAL | 1 refills | Status: DC | PRN
  Filled 2024-02-08: qty 120, 6d supply, fill #0
  Filled 2024-03-06: qty 120, 6d supply, fill #1

## 2024-02-08 NOTE — Patient Instructions (Signed)
 Medication Instructions:   *If you need a refill on your cardiac medications before your next appointment, please call your pharmacy*   Lab Work:  If you have labs (blood work) drawn today and your tests are completely normal, you will receive your results only by: MyChart Message (if you have MyChart) OR A paper copy in the mail If you have any lab test that is abnormal or we need to change your treatment, we will call you to review the results.   Testing/Procedures:    Follow-Up: At [facility], you and your health needs are our priority.  As part of our continuing mission to provide you with exceptional heart care, we have created designated Provider Care Teams.  These Care Teams include your primary Cardiologist (physician) and Advanced Practice Providers (APPs -  Physician Assistants and Nurse Practitioners) who all work together to provide you with the care you need, when you need it.  We recommend signing up for the patient portal called "MyChart".  Sign up information is provided on this After Visit Summary.  MyChart is used to connect with patients for Virtual Visits (Telemedicine).  Patients are able to view lab/test results, encounter notes, upcoming appointments, etc.  Non-urgent messages can be sent to your provider as well.   To learn more about what you can do with MyChart, go to [email or web address].

## 2024-03-05 DIAGNOSIS — R55 Syncope and collapse: Secondary | ICD-10-CM | POA: Diagnosis not present

## 2024-03-06 ENCOUNTER — Other Ambulatory Visit (HOSPITAL_COMMUNITY): Payer: Self-pay

## 2024-03-07 ENCOUNTER — Other Ambulatory Visit (HOSPITAL_COMMUNITY): Payer: Self-pay

## 2024-03-10 DIAGNOSIS — I10 Essential (primary) hypertension: Secondary | ICD-10-CM | POA: Diagnosis not present

## 2024-03-11 DIAGNOSIS — R55 Syncope and collapse: Secondary | ICD-10-CM | POA: Diagnosis not present

## 2024-03-20 NOTE — Progress Notes (Signed)
 Cardiology Office Note   Date:  03/21/2024   ID:  Shane Clark, DOB 07-14-1951, MRN [id number]  PCP:  Elida Grounds, DO  Cardiologist:   Ola Berger, MD   Pt presents for follow up of CP     History of Present Illness: Shane Clark is a 73 y.o. male with a history of chest pain  The pt has hs had several caths in past that showed no evidence of CAD Last cath in 1998   Myoview  in 2007 was normal  June 2022 Episode of CP  CCTA  Ca score 53   Angiogram showed minimal CAD   I saw the pt in clinic in Aug 2024  The pt was admitted on 01/31/24 with CP and near syncope The pt says on 3/2 he got up   Had coffee, meds    Read for 1 hour  Was standing in bathroom shaving when his chest started hurting  Sharp  Got SOB   Went into living room    Could breath    Severe    Says he passed out about 1 min    Pain not relieved with NTG   Came to [facility]   CTA neg for aortic pathology   Echo LVEF normal   Cardiology consulted    Did not feel CP was cardiac.   Syncope= Echo normal  Tele without arrhythma   Monitor planned    Note the pt says prior to recent admission he had had a URI   Had been doing a lot of coughing prior    I saw the pt in March 2025  Since seen he has had 2 dizzy spells  Both with quick standing No syncope He was seen by Dr Claudius Cumins  Off BP meds     Brings in readings from home   130s to 140s    He denies CP   No SOB     No outpatient medications have been marked as taking for the 03/21/24 encounter (Office Visit) with Elmyra Haggard, MD.     Allergies:   Codeine, Percocet [oxycodone-acetaminophen ], Angiotensin receptor blockers, Crestor  [rosuvastatin ], Lyrica [pregabalin], Mevacor [lovastatin], Pravachol [pravastatin], Talwin [pentazocine], Zestril [lisinopril], Zetia [ezetimibe], and Advair diskus [fluticasone -salmeterol]   Past Medical History:  Diagnosis Date   Allergy    Arthritis    Asthma    BPH (benign prostatic hyperplasia)    Cataract    Chronic back pain    "all over"    COPD (chronic obstructive pulmonary disease) (HCC)    GERD (gastroesophageal reflux disease)    Hx of adenomatous colonic polyps 03/22/2016   Hypertension    Influenza 11/20/2011   Sepsis (HCC) 02/05/2016   Small bowel obstruction (HCC)     Past Surgical History:  Procedure Laterality Date   ABDOMINAL HERNIA REPAIR     ANTERIOR CERVICAL DECOMP/DISCECTOMY FUSION     C6-8   APPENDECTOMY  1960s   BACK SURGERY     COLONOSCOPY     HERNIA REPAIR     INSERTION DISTRACTION DEVICE LUMBAR POSTERIOR SPINOUS PROCESS     KNEE ARTHROSCOPY Left    SHOULDER SURGERY Left 02/11/2017   SMALL BOWEL REPAIR       Social History:  The patient  reports that he has quit smoking. His smoking use included cigarettes. He has a 2.4 pack-year smoking history. He has quit using smokeless tobacco. He reports that he does not drink alcohol and does not use drugs.   Family  History:  The patient's family history includes Coronary artery disease in his father; Dementia in his mother; Diabetes type II in an other family member; Heart disease in his brother; Pancreatic cancer in his brother; Stroke in his brother.    ROS:  Please see the history of present illness. All other systems are reviewed and  Negative to the above problem except as noted.    PHYSICAL EXAM: VS:  BP (!) 140/70   Pulse 75   Ht 5\' 9"  (1.753 m)   Wt 246 lb 9.6 oz (111.9 kg)   SpO2 97%   BMI 36.42 kg/m   GEN: Obese 73 yo  in no acute distress  HEENT: normal  Neck: no JVD, carotid bruits Cardiac: RRR; no murmur  No LE edema  Respiratory:  clear to auscultation   GI: soft  Nontender  No masses  EKG:  EKG is not ordered today.   Lipid Panel    Component Value Date/Time   CHOL 87 (L) 09/16/2021 0718   TRIG 44 09/16/2021 0718   HDL 39 (L) 09/16/2021 0718   CHOLHDL 2.2 09/16/2021 0718   CHOLHDL 4.8 CALC 03/29/2008 1401   VLDL 12 03/29/2008 1401   LDLCALC 36 09/16/2021 0718      Wt Readings from Last 3 Encounters:  03/21/24  246 lb 9.6 oz (111.9 kg)  02/08/24 247 lb (112 kg)  01/31/24 248 lb (112.5 kg)      ASSESSMENT AND PLAN:  1 Hx of  Chest pain   Spell this spring was muscular, probably related to coughing   None now  2  Syncope   Spell in March probably vagal after severe/intense pain.    He has had some dizziness  Off meds    Follow BP for now  before aggressively lowering    Stay hydrated   Get up slowly, sit if dizzy  3  CAD   Minimal on CT scan   Follow    4  HTN  Follow for now     5  HL  LDL 46  HDL 42       6  Metabolics  With BMI 36, limit carbs  Stay active    Last A1C 5.5     Current medicines are reviewed at length with the patient today.  The patient does not have concerns regarding medicines.  Signed, Ola Berger, MD  03/21/2024 10:43 AM    [facility] [address] [place], [place], [place]  [address] Phone: [phone]; Fax: [phone]

## 2024-03-21 ENCOUNTER — Ambulatory Visit: Payer: Medicare Other | Attending: Internal Medicine | Admitting: Internal Medicine

## 2024-03-21 ENCOUNTER — Encounter: Payer: Self-pay | Admitting: Internal Medicine

## 2024-03-21 ENCOUNTER — Encounter: Payer: Self-pay | Admitting: Orthopaedic Surgery

## 2024-03-21 VITALS — BP 140/70 | HR 75 | Ht 69.0 in | Wt 246.6 lb

## 2024-03-21 DIAGNOSIS — R42 Dizziness and giddiness: Secondary | ICD-10-CM

## 2024-03-21 NOTE — Patient Instructions (Signed)
 Medication Instructions:   *If you need a refill on your cardiac medications before your next appointment, please call your pharmacy*  Lab Work:  If you have labs (blood work) drawn today and your tests are completely normal, you will receive your results only by: MyChart Message (if you have MyChart) OR A paper copy in the mail If you have any lab test that is abnormal or we need to change your treatment, we will call you to review the results.  Testing/Procedures:   Follow-Up: At [facility], you and your health needs are our priority.  As part of our continuing mission to provide you with exceptional heart care, our providers are all part of one team.  This team includes your primary Cardiologist (physician) and Advanced Practice Providers or APPs (Physician Assistants and Nurse Practitioners) who all work together to provide you with the care you need, when you need it.  Your next appointment:   September 2025 with Dr Avanell Bob   We recommend signing up for the patient portal called "MyChart".  Sign up information is provided on this After Visit Summary.  MyChart is used to connect with patients for Virtual Visits (Telemedicine).  Patients are able to view lab/test results, encounter notes, upcoming appointments, etc.  Non-urgent messages can be sent to your provider as well.   To learn more about what you can do with MyChart, go to [email or web address].   Other Instructions       1st Floor: - Lobby - Registration  - Pharmacy  - Lab - Cafe  2nd Floor: - PV Lab - Diagnostic Testing (echo, CT, nuclear med)  3rd Floor: - Vacant  4th Floor: - TCTS (cardiothoracic surgery) - AFib Clinic - Structural Heart Clinic - Vascular Surgery  - Vascular Ultrasound  5th Floor: - HeartCare Cardiology (general and EP) - Clinical Pharmacy for coumadin, hypertension, lipid, weight-loss medications, and med management appointments    Valet parking services will be  available as well.

## 2024-04-01 ENCOUNTER — Other Ambulatory Visit (HOSPITAL_COMMUNITY): Payer: Self-pay

## 2024-04-01 ENCOUNTER — Other Ambulatory Visit: Payer: Self-pay

## 2024-04-18 ENCOUNTER — Other Ambulatory Visit (HOSPITAL_COMMUNITY): Payer: Self-pay

## 2024-04-26 ENCOUNTER — Ambulatory Visit: Admitting: Orthopaedic Surgery

## 2024-05-10 ENCOUNTER — Other Ambulatory Visit (HOSPITAL_COMMUNITY): Payer: Self-pay

## 2024-05-11 ENCOUNTER — Other Ambulatory Visit (HOSPITAL_COMMUNITY): Payer: Self-pay

## 2024-06-06 ENCOUNTER — Other Ambulatory Visit (HOSPITAL_COMMUNITY): Payer: Self-pay

## 2024-06-07 ENCOUNTER — Other Ambulatory Visit (HOSPITAL_COMMUNITY): Payer: Self-pay

## 2024-06-07 MED ORDER — PANTOPRAZOLE SODIUM 40 MG PO TBEC
40.0000 mg | DELAYED_RELEASE_TABLET | Freq: Every day | ORAL | 3 refills | Status: AC
  Filled 2024-06-08: qty 90, 90d supply, fill #0
  Filled 2024-07-05: qty 100, 100d supply, fill #0
  Filled 2024-09-01 – 2024-10-04 (×3): qty 100, 100d supply, fill #1

## 2024-06-08 ENCOUNTER — Other Ambulatory Visit (HOSPITAL_COMMUNITY): Payer: Self-pay

## 2024-07-05 ENCOUNTER — Other Ambulatory Visit (HOSPITAL_COMMUNITY): Payer: Self-pay

## 2024-07-21 DIAGNOSIS — R197 Diarrhea, unspecified: Secondary | ICD-10-CM | POA: Diagnosis not present

## 2024-07-22 ENCOUNTER — Other Ambulatory Visit (HOSPITAL_COMMUNITY): Payer: Self-pay

## 2024-07-22 DIAGNOSIS — R197 Diarrhea, unspecified: Secondary | ICD-10-CM | POA: Diagnosis not present

## 2024-07-22 MED ORDER — CHOLESTYRAMINE 4 G PO PACK
4.0000 g | PACK | Freq: Every day | ORAL | 1 refills | Status: DC
  Filled 2024-07-22: qty 30, 30d supply, fill #0

## 2024-07-27 ENCOUNTER — Other Ambulatory Visit (HOSPITAL_COMMUNITY): Payer: Self-pay

## 2024-07-27 MED ORDER — LEVOFLOXACIN 500 MG PO TABS
500.0000 mg | ORAL_TABLET | Freq: Every day | ORAL | 0 refills | Status: DC
  Filled 2024-07-27: qty 5, 5d supply, fill #0

## 2024-08-01 ENCOUNTER — Other Ambulatory Visit (HOSPITAL_COMMUNITY): Payer: Self-pay

## 2024-08-02 ENCOUNTER — Other Ambulatory Visit: Payer: Self-pay

## 2024-08-02 ENCOUNTER — Other Ambulatory Visit (HOSPITAL_COMMUNITY): Payer: Self-pay

## 2024-08-03 ENCOUNTER — Telehealth: Payer: Self-pay | Admitting: Internal Medicine

## 2024-08-03 ENCOUNTER — Other Ambulatory Visit (HOSPITAL_COMMUNITY): Payer: Self-pay

## 2024-08-03 NOTE — Telephone Encounter (Signed)
 Inbound call from patient stating that in August he told by his PCP he had E. Coli and was put on a medication to help him. Patient states he is no better and is requesting a call back to discuss. Please advise.

## 2024-08-03 NOTE — Telephone Encounter (Signed)
 Attempted to reach pt to update on the POC to keep appt as currently scheduled for 08/05/24. No answer, personalized vm. Left vm for pt to return call if he has any questions.

## 2024-08-03 NOTE — Telephone Encounter (Signed)
 Reviewed labs in care everywhere in summarization of episode note.  He had enteropathogenic E. coli on a GI pathogen profile on August 27.  Sounds like he is improved.  Await office visit with Delon Failing, PA-C.

## 2024-08-03 NOTE — Telephone Encounter (Signed)
 Spoke with pt. He reports that he has had diarrhea for the entire month of August. He was having stools about every 2-3 hours. Went to his PCP on 07/21/2024 and they gave him a 5 day dose of Levaquin  for a diagnosis of E Coli. He reports his stools have decreased he is now having about 4 loose to runny stools per day. Reports occasional mucus is noted but not every time he goes. Does report occasional abd spasms and a burning sensation to the upper abd. Denies nausea, vomiting, or loss of appetite. Is able to keep foods and liquids down. Pt has not been seen in office, his last colonoscopy was 06/11/2023. Pt scheduled for OV this Friday 08/05/2024 at 1330 with [facility].

## 2024-08-04 DIAGNOSIS — H2513 Age-related nuclear cataract, bilateral: Secondary | ICD-10-CM | POA: Diagnosis not present

## 2024-08-04 DIAGNOSIS — H5213 Myopia, bilateral: Secondary | ICD-10-CM | POA: Diagnosis not present

## 2024-08-04 DIAGNOSIS — H524 Presbyopia: Secondary | ICD-10-CM | POA: Diagnosis not present

## 2024-08-04 DIAGNOSIS — H35363 Drusen (degenerative) of macula, bilateral: Secondary | ICD-10-CM | POA: Diagnosis not present

## 2024-08-05 ENCOUNTER — Other Ambulatory Visit

## 2024-08-05 ENCOUNTER — Encounter: Payer: Self-pay | Admitting: Physician Assistant

## 2024-08-05 ENCOUNTER — Ambulatory Visit: Admitting: Physician Assistant

## 2024-08-05 VITALS — BP 154/76 | HR 74 | Ht 69.0 in | Wt 243.0 lb

## 2024-08-05 DIAGNOSIS — R1084 Generalized abdominal pain: Secondary | ICD-10-CM | POA: Diagnosis not present

## 2024-08-05 DIAGNOSIS — R197 Diarrhea, unspecified: Secondary | ICD-10-CM

## 2024-08-05 DIAGNOSIS — R634 Abnormal weight loss: Secondary | ICD-10-CM

## 2024-08-05 DIAGNOSIS — Z8719 Personal history of other diseases of the digestive system: Secondary | ICD-10-CM | POA: Diagnosis not present

## 2024-08-05 NOTE — Progress Notes (Addendum)
 Chief Complaint: Diarrhea  HPI:    Shane Clark is a 73 year old Caucasian male with a past medical history as listed below including CHF (02/01/2024 echo with LVEF 60-65%), COPD and GERD, known to Dr. Avram, who was referred to me by Auston Opal, DO for a complaint of diarrhea.    04/28/2023 CT abdomen pelvis with contrast showed dilated loop of small bowel in the right upper quadrant with air-fluid level relatively decompressed small bowel.  Findings consistent with partial small bowel obstruction.  Small volume gas and stool throughout the colon.    06/11/2023 colonoscopy for surveillance of adenomatous polyps last colonoscopy greater than 5 years ago in 2017 with 1 diminutive polyp in the proximal sigmoid colon, significant looping requiring simultaneous left lower quadrant and periumbilical abdominal pressure.  Pathology showed diminutive adenoma no repeat recommended due to age.    01/31/2024 CT angio of the chest abdomen pelvis with aneurysm of the infrarenal abdominal aorta measuring up to 3.2 x 2.8 cm in caliber, recommended a follow-up ultrasound every 3 years, minimal emphysema and mild diffuse bilateral bronchial wall thickening likely smoking-related respiratory bronchiolitis and mildly coarse contour liver suggestive of cirrhosis.  Prostatomegaly.    02/01/2024 CBC with a white count 11.5 and otherwise normal.  Platelets normal.  CMP normal.    07/22/2024 CBC and CMP normal.  TSH 1.88.  Normal.  CRP 4 normal.  ESR 17 normal.    07/27/2024 GI path panel showed enteropathogenic E. coli.  Patient treated with Levofloxacin  500 mg once daily for 5 days.    Today, patient presents to clinic and explains that he is not really any better.  He went on vacation the first week of August and a couple of days after returning home he started with diarrhea which was at least 6-8 times a day.  Initially he took some Imodium and it worked for a couple of days but then it just came right back.  He went to see his  PCP initially on 8/21 and was given Cholestyramine  which did not help at all.  Stool studies then returned with E. coli and he was treated with an antibiotic which he finished on Monday.  Tells me while taking the antibiotic he got a little bit of stool and mucus and now that he stopped it just seems like mucus and watery stool 5-6 times a day exacerbated by eating.  It does wake him from his sleep in the night.  He has started to lose a little bit of weight, down around 3 to 4 pounds over the past week.  Tells me that he has not had a fever or chills.  He has not tried any further Imodium.  He does have accompanying abdominal pain along the left side of his abdomen into the left upper quadrant and left lower quadrant which seems slightly better when he is up and walking around.  Also hears a lot of gurgling.  He has continued to use his MiraLAX  twice a week because he has history of bowel obstruction and was nervous about this recurring.  Last bowel obstruction a year ago.    Denies shortness of breath, sick contacts or changes in diet.  Past Medical History:  Diagnosis Date   Allergy    Arthritis    Asthma    BPH (benign prostatic hyperplasia)    Cataract    Chronic back pain    all over   COPD (chronic obstructive pulmonary disease) (HCC)    GERD (  gastroesophageal reflux disease)    Hx of adenomatous colonic polyps 03/22/2016   Hypertension    Influenza 11/20/2011   Sepsis (HCC) 02/05/2016   Small bowel obstruction (HCC)     Past Surgical History:  Procedure Laterality Date   ABDOMINAL HERNIA REPAIR     ANTERIOR CERVICAL DECOMP/DISCECTOMY FUSION     C6-8   APPENDECTOMY  1960s   BACK SURGERY     COLONOSCOPY     HERNIA REPAIR     INSERTION DISTRACTION DEVICE LUMBAR POSTERIOR SPINOUS PROCESS     KNEE ARTHROSCOPY Left    SHOULDER SURGERY Left 02/11/2017   SMALL BOWEL REPAIR      Current Outpatient Medications  Medication Sig Dispense Refill   acetaminophen  (TYLENOL ) 500 MG  tablet Take 1,000 mg by mouth every 6 (six) hours as needed for moderate pain.     albuterol  (PROAIR  HFA) 108 (90 Base) MCG/ACT inhaler Inhale 1-2 puffs into the lungs every 4 (four) hours as needed. (Patient taking differently: Inhale 1-2 puffs into the lungs every 6 (six) hours as needed for wheezing or shortness of breath.) 6.7 g 3   budesonide -formoterol  (SYMBICORT ) 80-4.5 MCG/ACT inhaler Inhale 2 puffs into the lungs 2 (two) times daily. 30.6 g 3   cholestyramine  (QUESTRAN ) 4 g packet Take 1 packet (4 g total) mixed with water or non-carbonated beverage by mouth daily. 30 packet 1   ipratropium (ATROVENT ) 0.03 % nasal spray Place 2 sprays into the nose 2 (two) times daily. 30 mL 1   levofloxacin  (LEVAQUIN ) 500 MG tablet Take 1 tablet (500 mg total) by mouth daily. 5 tablet 0   Multiple Vitamins-Minerals (ZINC PO) Take 1 tablet by mouth daily.     nitroGLYCERIN  (NITROSTAT ) 0.4 MG SL tablet Place 1 tablet (0.4 mg total) under the tongue as needed. 25 tablet 11   pantoprazole  (PROTONIX ) 40 MG tablet Take 1 tablet (40 mg total) by mouth daily for acid reflux 100 tablet 3   promethazine -dextromethorphan (PROMETHAZINE -DM) 6.25-15 MG/5ML syrup Take 5 mLs by mouth every 6 (six) hours as needed. 120 mL 1   rosuvastatin  (CRESTOR ) 5 MG tablet Take 1 tablet (5 mg total) by mouth daily for cholesterol. 90 tablet 4   tamsulosin  (FLOMAX ) 0.4 MG CAPS capsule Take 1 capsule (0.4 mg total) by mouth daily, 30 minutes after the same meal each day for urinary symptoms 90 capsule 4   telmisartan  (MICARDIS ) 20 MG tablet Take 1 tablet (20 mg total) by mouth daily for blood pressure. 90 tablet 4   No current facility-administered medications for this visit.    Allergies as of 08/05/2024 - Review Complete 03/21/2024  Allergen Reaction Noted   Codeine Shortness Of Breath 01/22/2022   Percocet [oxycodone-acetaminophen ] Anaphylaxis 04/09/2022   Angiotensin receptor blockers Other (See Comments) 01/22/2022   Crestor   [rosuvastatin ]  01/22/2022   Lyrica [pregabalin] Other (See Comments) 01/22/2022   Mevacor [lovastatin]  01/22/2022   Pravachol [pravastatin] Other (See Comments) 01/22/2022   Talwin [pentazocine] Nausea Only and Other (See Comments) 04/09/2022   Zestril [lisinopril] Other (See Comments) 01/22/2022   Zetia [ezetimibe] Itching 01/22/2022   Advair diskus [fluticasone -salmeterol] Rash 11/20/2011    Family History  Problem Relation Age of Onset   Dementia Mother    Coronary artery disease Father    Pancreatic cancer Brother    Stroke Brother    Heart disease Brother    Diabetes type II Other    Colon cancer Neg Hx    Colon polyps Neg Hx  Rectal cancer Neg Hx    Stomach cancer Neg Hx    Esophageal cancer Neg Hx     Social History   Socioeconomic History   Marital status: Married    Spouse name: Not on file   Number of children: Not on file   Years of education: Not on file   Highest education level: Not on file  Occupational History   Not on file  Tobacco Use   Smoking status: Former    Current packs/day: 0.12    Average packs/day: 0.1 packs/day for 20.0 years (2.4 ttl pk-yrs)    Types: Cigarettes   Smokeless tobacco: Former   Tobacco comments:    quit smoking cigarettes in the 1980s  Substance and Sexual Activity   Alcohol use: No    Alcohol/week: 0.0 standard drinks of alcohol   Drug use: No   Sexual activity: Yes  Other Topics Concern   Not on file  Social History Narrative   Not on file   Social Drivers of Health   Financial Resource Strain: Not on file  Food Insecurity: No Food Insecurity (01/31/2024)   Hunger Vital Sign    Worried About Running Out of Food in the Last Year: Never true    Ran Out of Food in the Last Year: Never true  Transportation Needs: No Transportation Needs (01/31/2024)   PRAPARE - Administrator, Civil Service (Medical): No    Lack of Transportation (Non-Medical): No  Physical Activity: Not on file  Stress: Not on file   Social Connections: Socially Integrated (01/31/2024)   Social Connection and Isolation Panel    Frequency of Communication with Friends and Family: Three times a week    Frequency of Social Gatherings with Friends and Family: Three times a week    Attends Religious Services: More than 4 times per year    Active Member of Clubs or Organizations: Yes    Attends Banker Meetings: More than 4 times per year    Marital Status: Married  Catering manager Violence: Not At Risk (01/31/2024)   Humiliation, Afraid, Rape, and Kick questionnaire    Fear of Current or Ex-Partner: No    Emotionally Abused: No    Physically Abused: No    Sexually Abused: No    Review of Systems:    Constitutional: No weight loss, fever or chills Skin: No rash  Cardiovascular: No chest pain   Respiratory: No SOB  Gastrointestinal: See HPI and otherwise negative Genitourinary: No dysuria Neurological: No headache, dizziness or syncope Musculoskeletal: No new muscle or joint pain Hematologic: No bleeding  Psychiatric: No history of depression or anxiety   Physical Exam:  Vital signs: BP (!) 154/76   Pulse 74   Ht 5' 9 (1.753 m)   Wt 243 lb (110.2 kg)   BMI 35.88 kg/m    Constitutional:   Pleasant elderly Caucasian male appears to be in NAD, Well developed, Well nourished, alert and cooperative Head:  Normocephalic and atraumatic. Eyes:   PEERL, EOMI. No icterus. Conjunctiva pink. Ears:  Normal auditory acuity. Neck:  Supple Throat: Oral cavity and pharynx without inflammation, swelling or lesion.  Respiratory: Respirations even and unlabored. Lungs clear to auscultation bilaterally.   No wheezes, crackles, or rhonchi.  Cardiovascular: Normal S1, S2. No MRG. Regular rate and rhythm. No peripheral edema, cyanosis or pallor.  Gastrointestinal:  Soft, nondistended, moderate left-sided abdominal TTP upper quadrant and lower quadrant with some involuntary guarding, increased bowel sounds all 4  quadrants, more on the left. No appreciable masses or hepatomegaly. Rectal:  Not performed.  Msk:  Symmetrical without gross deformities. Without edema, no deformity or joint abnormality.  Neurologic:  Alert and  oriented x4;  grossly normal neurologically.  Skin:   Dry and intact without significant lesions or rashes. Psychiatric: Demonstrates good judgement and reason without abnormal affect or behaviors.  See HPI for recent labs and imaging.  Assessment: 1.  Diarrhea: Patient saw PCP on 07/22/2024 and GI path panel showed enteropathogenic E. coli, treated with Levofloxacin  500 mg daily for 5 days, last colonoscopy in July 2024 with a diminutive adenoma and no repeat recommended due to age, patient took levofloxacin  and continues with diarrhea and abdominal pain on the left side of his abdomen which wakes him from sleep; consider ongoing infectious diarrhea versus postinfectious IBS versus other colitis 2.  History of bowel obstruction: The last in May 2024, this does not appear to be the same picture but patient describes pain that is similar 3.  Weight loss: Patient reports a 4 to 5 pound weight loss over the past couple of weeks, likely related to diarrhea 4.  Generalized abdominal pain: Worse on the left side of his abdomen, likely related to diarrhea  Plan: 1.  CT abdomen pelvis with contrast for evaluation of ongoing diarrhea, weight loss and abdominal pain.  This was ordered stat. 2.  Repeat stool studies including a GI path panel, fecal calprotectin and fecal lactoferrin. 3.  Instructed patient to use Imodium as needed for ongoing diarrhea 4.  Would recommend he hold MiraLAX  as this is likely making the situation worse right now.  He should only take this if he has not had a bowel movement at all for 2 to 3 days, at least at the moment until he returns to normal. 5.  Discussed possibility of postinfectious IBS. 6.  If ongoing symptoms and CT unrevealing stool studies negative then can  consider colonoscopy 7.  Patient to follow in clinic per recommendations after stool studies and imaging above.  Shane Failing, PA-C [facility] 08/05/2024, 10:15 AM    [facility] Attending    It is possible EPEC is resistant to quinolones. Await CT -  if fails to reveal cause will treat with azithromycin  500 mg daily x 3 days   Cc: Auston Opal, DO

## 2024-08-05 NOTE — Patient Instructions (Addendum)
 Hold Miralax    Use Imodium as needed.   Your provider has requested that you go to the basement level for lab work before leaving today. Press B on the elevator. The lab is located at the first door on the left as you exit the elevator.   You have been scheduled for a CT scan of the abdomen and pelvis at [facility]. You are scheduled on Saturday 08/06/24 at 9 am. You should arrive 15 minutes prior to your appointment time for registration.     Please follow the written instructions below on the day of your exam:   1) Do not eat anything after 7 am (4 hours prior to your test)   You may take any medications as prescribed with a small amount of water, if necessary. If you take any of the following medications: METFORMIN, GLUCOPHAGE, GLUCOVANCE, AVANDAMET, RIOMET, FORTAMET, ACTOPLUS MET, JANUMET, GLUMETZA or METAGLIP, you MAY be asked to HOLD this medication 48 hours AFTER the exam.   The purpose of you drinking the oral contrast is to aid in the visualization of your intestinal tract. The contrast solution may cause some diarrhea. Depending on your individual set of symptoms, you may also receive an intravenous injection of x-ray contrast/dye. Plan on being at [facility] for 45 minutes or longer, depending on the type of exam you are having performed.   If you have any questions regarding your exam or if you need to reschedule, you may call [facility] at [phone] between the hours of 8:00 am and 5:00 pm, Monday-Friday.

## 2024-08-06 ENCOUNTER — Telehealth: Payer: Self-pay | Admitting: Internal Medicine

## 2024-08-06 ENCOUNTER — Ambulatory Visit (HOSPITAL_BASED_OUTPATIENT_CLINIC_OR_DEPARTMENT_OTHER)
Admission: RE | Admit: 2024-08-06 | Discharge: 2024-08-06 | Disposition: A | Discharge status: Home / Self Care | Source: Ambulatory Visit | Attending: Physician Assistant | Admitting: Physician Assistant

## 2024-08-06 DIAGNOSIS — N3289 Other specified disorders of bladder: Secondary | ICD-10-CM | POA: Diagnosis not present

## 2024-08-06 DIAGNOSIS — R197 Diarrhea, unspecified: Secondary | ICD-10-CM | POA: Insufficient documentation

## 2024-08-06 DIAGNOSIS — I7143 Infrarenal abdominal aortic aneurysm, without rupture: Secondary | ICD-10-CM | POA: Diagnosis not present

## 2024-08-06 DIAGNOSIS — R634 Abnormal weight loss: Secondary | ICD-10-CM | POA: Insufficient documentation

## 2024-08-06 DIAGNOSIS — R1084 Generalized abdominal pain: Secondary | ICD-10-CM | POA: Diagnosis not present

## 2024-08-06 DIAGNOSIS — K7689 Other specified diseases of liver: Secondary | ICD-10-CM | POA: Diagnosis not present

## 2024-08-06 DIAGNOSIS — R9341 Abnormal radiologic findings on diagnostic imaging of renal pelvis, ureter, or bladder: Secondary | ICD-10-CM

## 2024-08-06 DIAGNOSIS — N281 Cyst of kidney, acquired: Secondary | ICD-10-CM | POA: Diagnosis not present

## 2024-08-06 DIAGNOSIS — A040 Enteropathogenic Escherichia coli infection: Secondary | ICD-10-CM

## 2024-08-06 MED ORDER — AZITHROMYCIN 500 MG PO TABS: 500.0000 mg | ORAL_TABLET | Freq: Every day | ORAL | 0 refills | Status: AC

## 2024-08-06 MED ORDER — BARIUM SULFATE 2 % PO SUSP
450.0000 mL | Freq: Once | ORAL | Status: AC
  Administered 2024-08-06: 450 mL via ORAL

## 2024-08-06 MED ORDER — IOHEXOL 300 MG/ML  SOLN
100.0000 mL | Freq: Once | INTRAMUSCULAR | Status: AC | PRN
  Administered 2024-08-06: 100 mL via INTRAVENOUS

## 2024-08-06 NOTE — Telephone Encounter (Signed)
 Reviewed CT scan - has a possible lesion in bladder, enlarged prostate and known aortic aneurysm - he does not have any hematuria, has known BPH  1) I sent in azithromycin  500 mg x 3 days to see if that stops diarrhea (has EPEC)  2) please refer to [facility] re: urinary bladder lesion on CT scan

## 2024-08-06 NOTE — Telephone Encounter (Signed)
 Called about Ct results and diarrhea - will try again by tomorrow

## 2024-08-08 ENCOUNTER — Ambulatory Visit: Payer: Self-pay | Admitting: Physician Assistant

## 2024-08-08 LAB — GI PROFILE, STOOL, PCR

## 2024-08-08 LAB — FECAL LACTOFERRIN, QUANT
Fecal Lactoferrin: NEGATIVE
MICRO NUMBER:: 16929166
SPECIMEN QUALITY:: ADEQUATE

## 2024-08-08 NOTE — Telephone Encounter (Signed)
 Referral has been made with records faxed.

## 2024-08-08 NOTE — Progress Notes (Signed)
 Patient has been sent in Azithromycin .  Delon Failing, PA-C

## 2024-08-09 LAB — CALPROTECTIN, FECAL: Calprotectin, Fecal: 56 ug/g (ref 0–120)

## 2024-08-11 DIAGNOSIS — R3912 Poor urinary stream: Secondary | ICD-10-CM | POA: Diagnosis not present

## 2024-08-17 NOTE — Telephone Encounter (Signed)
 Inbound call from patient states's he is having diarrhea. Requesting to speak with a nurse. Please advise.   Thank you

## 2024-08-19 ENCOUNTER — Encounter: Payer: Self-pay | Admitting: Internal Medicine

## 2024-08-19 ENCOUNTER — Ambulatory Visit: Admitting: Internal Medicine

## 2024-08-19 VITALS — BP 130/72 | HR 68 | Ht 67.5 in | Wt 243.4 lb

## 2024-08-19 DIAGNOSIS — K529 Noninfective gastroenteritis and colitis, unspecified: Secondary | ICD-10-CM

## 2024-08-19 DIAGNOSIS — A040 Enteropathogenic Escherichia coli infection: Secondary | ICD-10-CM

## 2024-08-19 DIAGNOSIS — R197 Diarrhea, unspecified: Secondary | ICD-10-CM

## 2024-08-19 MED ORDER — AZITHROMYCIN 500 MG PO TABS: 500.0000 mg | ORAL_TABLET | Freq: Two times a day (BID) | ORAL | 0 refills | Status: AC

## 2024-08-19 NOTE — Patient Instructions (Addendum)
 _______________________________________________________  If your blood pressure at your visit was 140/90 or greater, please contact your primary care physician to follow up on this.  _______________________________________________________  If you are age 73 or older, your body mass index should be between 23-30. Your Body mass index is 37.56 kg/m. If this is out of the aforementioned range listed, please consider follow up with your Primary Care Provider.  If you are age 69 or younger, your body mass index should be between 19-25. Your Body mass index is 37.56 kg/m. If this is out of the aformentioned range listed, please consider follow up with your Primary Care Provider.   ________________________________________________________  The [facility] providers would like to encourage you to use MYCHART to communicate with providers for non-urgent requests or questions.  Due to long hold times on the telephone, sending your provider a message by [facility] may be a faster and more efficient way to get a response.  Please allow 48 business hours for a response.  Please remember that this is for non-urgent requests.  _______________________________________________________  [facility] is using a team-based approach to care.  Your team is made up of your doctor and two to three APPS. Our APPS (Nurse Practitioners and Physician Assistants) work with your physician to ensure care continuity for you. They are fully qualified to address your health concerns and develop a treatment plan. They communicate directly with your gastroenterologist to care for you. Seeing the Advanced Practice Practitioners on your physician's team can help you by facilitating care more promptly, often allowing for earlier appointments, access to diagnostic testing, procedures, and other specialty referrals.   We have sent the following medications to your pharmacy for you to pick up at your convenience: Azithromycin  2  times a day for 5 days  Continue to use imodium as needed  Please update us  at the end of next week on how you are doing or sooner if needed.  It was a pleasure to see you today!  Thank you for trusting me with your gastrointestinal care!

## 2024-08-19 NOTE — Progress Notes (Signed)
 Shane Clark 73 y.o. [birth year] [id number]  Assessment & Plan:   Encounter Diagnoses  Name Primary?   Chronic diarrhea Yes   Intestinal infection due to enteropathogenic E. coli    Has chronic diarrhea and evidence for enteropathogenic E. coli infection with a positive PCR stool test.  This is a bit unusual, the persistent diarrhea seems more severe than what postinfectious IBS could be but that is in the differential.  Perhaps he has a persistent EPEC infection.  Since he did seem to respond to the azithromycin  I am going to treat him for 5 days instead of 3 days with 500 mg twice daily to see if that does not resolve this.    If retreatment does not resolve things I would proceed to a colonoscopy with random biopsies as it is possible to have microscopic colitis and normal calprotectin.  Use loperamide as needed  Meds ordered this encounter  Medications   azithromycin  (ZITHROMAX ) 500 MG tablet    Sig: Take 1 tablet (500 mg total) by mouth 2 (two) times daily for 5 days.    Dispense:  10 tablet    Refill:  0   I appreciate the opportunity to care for this patient. CC: Auston Opal, DO   Subjective:   Chief Complaint: Diarrhea  HPI 73 year old man who diagnosed with enteropathogenic E. coli on a GI pathogen panel in [facility] in August after a vacation in [place]  as summarized below.  He took Levaquin  and had no relief.  He was seen by Delon Failing, PA-C on 9 5 with persistent watery diarrhea and was evaluated as follows:  Repeat GI profile showed persistent EPEC no other pathogen's.  Stool for WBC and lactoferrin was negative and a fecal calprotectin was 56 which is just into the borderline range.  He had a CT scan of the abdomen and pelvis with contrast on 9 6, and there was no small bowel obstruction, but there was a small rounded density in the urinary bladder and cystoscopy was recommended.  He also had a 3.1 cm infrarenal abdominal aortic aneurysm.  He  saw urology and the bladder lesion turned out to be prostate impression on the bladder.  At the time of the CT scan I called him and prescribed azithromycin  500 mg twice daily for 3 days thinking that perhaps he had EPEC that was resistant to Levaquin .  He said that while he was taking that the diarrhea seemed to abate but then it came back.  Intermittent Imodium will provide temporary relief.  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   He has been experiencing persistent diarrhea since the first week of August, which began during a family vacation at [facility], [place] . The diarrhea started on a Tuesday after arriving on Sunday and has persisted since then. The stools are watery and runny, with occasional mucus. The frequency of diarrhea is reduced with the use of Imodium, but he sometimes does not make it to the bathroom in time.   No recent travel outside of the family vacation, tick bites, or similar past episodes. No fever or rashes. He reports a stable weight and no significant changes in his stomach's normal feeling, although he experiences gurgling in the left quadrant. He has a history of hernia repair and sometimes experiences an ache in that area.  The diarrhea occurs continuously, including at night, and is not influenced by eating. He had a colonoscopy last year, which  did not reveal any causes for diarrhea. He is concerned about the impact on his daily life, including an incident at church where he soiled his clothes.  He is maintaining his fluid intake and has not noticed any illness in others around him.      Wt Readings from Last 3 Encounters:  08/19/24 243 lb 6 oz (110.4 kg)  08/05/24 243 lb (110.2 kg)  03/21/24 246 lb 9.6 oz (111.9 kg)   07/22/2024 CBC and CMP normal. TSH 1.88. Normal. CRP 4 normal. ESR 17 normal.   06/11/2023 colonoscopy for surveillance of adenomatous polyps last colonoscopy greater than 5 years ago in 2017 with 1  diminutive polyp in the proximal sigmoid colon, significant looping requiring simultaneous left lower quadrant and periumbilical abdominal pressure. Pathology showed diminutive adenoma no repeat recommended due to age.   Allergies  Allergen Reactions   Codeine Shortness Of Breath   Percocet [Oxycodone-Acetaminophen ] Anaphylaxis   Angiotensin Receptor Blockers Other (See Comments)    Dyspnea   Crestor  [Rosuvastatin ]     Other reaction(s): Feet Hurt   Lyrica [Pregabalin] Other (See Comments)    Headache   Mevacor [Lovastatin]     Joint Pain   Pravachol [Pravastatin] Other (See Comments)   Talwin [Pentazocine] Nausea Only and Other (See Comments)    Stopped breathing   Zestril [Lisinopril] Other (See Comments)    Dyspnea   Zetia [Ezetimibe] Itching   Advair Diskus [Fluticasone -Salmeterol] Rash    unknown   Current Meds  Medication Sig   acetaminophen  (TYLENOL ) 500 MG tablet Take 1,000 mg by mouth every 6 (six) hours as needed for moderate pain.   albuterol  (PROAIR  HFA) 108 (90 Base) MCG/ACT inhaler Inhale 1-2 puffs into the lungs every 4 (four) hours as needed. (Patient taking differently: Inhale 1-2 puffs into the lungs every 6 (six) hours as needed for wheezing or shortness of breath.)   budesonide -formoterol  (SYMBICORT ) 80-4.5 MCG/ACT inhaler Inhale 2 puffs into the lungs 2 (two) times daily.   Multiple Vitamins-Minerals (ZINC PO) Take 1 tablet by mouth daily.   nitroGLYCERIN  (NITROSTAT ) 0.4 MG SL tablet Place 1 tablet (0.4 mg total) under the tongue as needed.   pantoprazole  (PROTONIX ) 40 MG tablet Take 1 tablet (40 mg total) by mouth daily for acid reflux   rosuvastatin  (CRESTOR ) 5 MG tablet Take 1 tablet (5 mg total) by mouth daily for cholesterol.   tamsulosin  (FLOMAX ) 0.4 MG CAPS capsule Take 1 capsule (0.4 mg total) by mouth daily, 30 minutes after the same meal each day for urinary symptoms   Past Medical History:  Diagnosis Date   Allergy    Arthritis    Asthma    BPH  (benign prostatic hyperplasia)    Cataract    Chronic back pain    all over   COPD (chronic obstructive pulmonary disease) (HCC)    GERD (gastroesophageal reflux disease)    Hx of adenomatous colonic polyps 03/22/2016   Hypertension    Influenza 11/20/2011   Sepsis (HCC) 02/05/2016   Small bowel obstruction (HCC)    Past Surgical History:  Procedure Laterality Date   ABDOMINAL HERNIA REPAIR     ANTERIOR CERVICAL DECOMP/DISCECTOMY FUSION     C6-8   APPENDECTOMY  1960s   BACK SURGERY     COLONOSCOPY     HERNIA REPAIR     INSERTION DISTRACTION DEVICE LUMBAR POSTERIOR SPINOUS PROCESS     KNEE ARTHROSCOPY Left    SHOULDER SURGERY Left 02/11/2017   SMALL BOWEL REPAIR  Social History   Social History Narrative   Not on file   family history includes Coronary artery disease in his father; Dementia in his mother; Diabetes type II in an other family member; Heart disease in his brother; Pancreatic cancer in his brother; Stroke in his brother.   Review of Systems   Objective:   Physical Exam @BP  130/72 (BP Location: Right Arm, Patient Position: Sitting, Cuff Size: Large)   Pulse 68   Ht 5' 7.5 (1.715 m) Comment: height measured without shoes  Wt 243 lb 6 oz (110.4 kg)   BMI 37.56 kg/m @  General:  NAD Eyes:   anicteric Lungs:  clear Heart::  S1S2 no rubs, murmurs or gallops Abdomen:  soft and nontender, BS+, obese and tympanitic, small defect below umbilical hernia repair w/ eschar - all chronic changes Ext:   no edema, cyanosis or clubbing    Data Reviewed:  See HPI

## 2024-08-28 ENCOUNTER — Encounter: Payer: Self-pay | Admitting: Internal Medicine

## 2024-09-01 ENCOUNTER — Other Ambulatory Visit: Payer: Self-pay

## 2024-09-01 ENCOUNTER — Other Ambulatory Visit (HOSPITAL_COMMUNITY): Payer: Self-pay

## 2024-09-19 ENCOUNTER — Telehealth: Payer: Self-pay | Admitting: Internal Medicine

## 2024-09-19 NOTE — Telephone Encounter (Signed)
 Inbound call from patient stating he is still having liquid stools. States he finished antibiotics on 9/24. States he has no abdominal but a significant amount of gas. Patient is requesting a call back to discuss further recommendations. Please advise, thank you.

## 2024-09-19 NOTE — Telephone Encounter (Signed)
 The pt has a history of E coli and took antibiotics and finished course on 9/24.  He never got completely better and still has watery stools.  No abd discomfort, bleeding, or mucous in the stools.  He has about 3 episodes daily.  Please advise

## 2024-09-21 NOTE — Telephone Encounter (Signed)
 I called the patient.  He is overall better but was wondering why stools are still loose or watery.  He is making it to the bathroom and using some intermittent Imodium.     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  I explained that no additional testing or treatment at this time other than using Imodium.  Also advised the use of probiotic foods like yogurt.  There is a nurse visit available on November 21 please schedule him for that for a follow-up.  I have told him he can cancel if everything is better.

## 2024-09-21 NOTE — Telephone Encounter (Signed)
 Appt made for 10/21/24 at 1020 am with Harlene Mail PA  Pt aware

## 2024-10-03 ENCOUNTER — Encounter: Payer: Self-pay | Admitting: Radiology

## 2024-10-04 ENCOUNTER — Other Ambulatory Visit (HOSPITAL_COMMUNITY): Payer: Self-pay

## 2024-10-04 ENCOUNTER — Other Ambulatory Visit: Payer: Self-pay

## 2024-10-06 ENCOUNTER — Other Ambulatory Visit (HOSPITAL_COMMUNITY): Payer: Self-pay

## 2024-10-21 ENCOUNTER — Ambulatory Visit: Admitting: Gastroenterology

## 2024-10-21 ENCOUNTER — Encounter: Payer: Self-pay | Admitting: Gastroenterology

## 2024-10-21 VITALS — BP 126/80 | HR 68 | Ht 67.5 in | Wt 243.0 lb

## 2024-10-21 DIAGNOSIS — R197 Diarrhea, unspecified: Secondary | ICD-10-CM | POA: Diagnosis not present

## 2024-10-21 NOTE — Progress Notes (Signed)
 10/21/2024 Shane Clark [id number] 04-Oct-1951  Discussed the use of AI scribe software for clinical note transcription with the patient, who gave verbal consent to proceed.  History of Present Illness Shane Clark is a 73 year old male who presents with persistent diarrhea following an E. coli infection in September.  He is a patient of Dr. Darilyn.  He has been experiencing persistent diarrhea characterized by runny stools since an E. coli infection in early September. The stool is initially clear in the morning, becoming 'muddy' and then forming into small pieces about the size of a finger. He has bowel movements approximately eight to ten times a day.  Imodium has been used to manage the symptoms, resulting in smaller stools. Without Imodium, the stool remains runny. The diarrhea has improved significantly compared to the initial phase of the E. coli infection, with much more than 50% improvement, but it has not returned to normal.  No associated pain, blood in the stool, or nausea. His appetite remains unaffected. However, he wakes up at night to defecate.  He has not experienced similar symptoms prior to this infection and has not been on any new medications or antibiotics recently(other than for the [facility]). There have been no significant changes in his diet, and he does not consume diet sodas. He has not traveled recently.  Bowel movements are sometimes forceful, and his abdomen makes more noise than usual, described as 'bubbly'.   Colonoscopy 06/2023: - One diminutive polyp in the proximal sigmoid colon, removed with a cold snare. Resected and retrieved. - The examination was otherwise normal on direct and retroflexion views. There was significant looping requiring simultaneous LLQ and periumbilical abdominal pressure application. Consider abdominal binder use for any future colonoscopy. - Personal history of colonic polyps. 2 diminutive adenomas removed 2017  Surgical  [P], colon, sigmoid, polyp (1) - TUBULAR ADENOMA - NEGATIVE FOR HIGH-GRADE DYSPLASIA OR MALIGNANCY   Past Medical History:  Diagnosis Date   Allergy    Arthritis    Asthma    BPH (benign prostatic hyperplasia)    Cataract    Chronic back pain    all over   COPD (chronic obstructive pulmonary disease) (HCC)    GERD (gastroesophageal reflux disease)    Hx of adenomatous colonic polyps 03/22/2016   Hypertension    Influenza 11/20/2011   Sepsis (HCC) 02/05/2016   Small bowel obstruction (HCC)    Past Surgical History:  Procedure Laterality Date   ABDOMINAL HERNIA REPAIR     ANTERIOR CERVICAL DECOMP/DISCECTOMY FUSION     C6-8   APPENDECTOMY  1960s   BACK SURGERY     COLONOSCOPY     HERNIA REPAIR     INSERTION DISTRACTION DEVICE LUMBAR POSTERIOR SPINOUS PROCESS     KNEE ARTHROSCOPY Left    SHOULDER SURGERY Left 02/11/2017   SMALL BOWEL REPAIR      reports that he has quit smoking. His smoking use included cigarettes. He has a 2.4 pack-year smoking history. He has quit using smokeless tobacco. He reports that he does not drink alcohol and does not use drugs. family history includes Coronary artery disease in his father; Dementia in his mother; Diabetes type II in an other family member; Heart disease in his brother; Pancreatic cancer in his brother; Stroke in his brother. Allergies  Allergen Reactions   Codeine Shortness Of Breath   Percocet [Oxycodone-Acetaminophen ] Anaphylaxis   Angiotensin Receptor Blockers Other (See Comments)    Dyspnea   Crestor  [Rosuvastatin ]  Other reaction(s): Feet Hurt   Lyrica [Pregabalin] Other (See Comments)    Headache   Mevacor [Lovastatin]     Joint Pain   Pravachol [Pravastatin] Other (See Comments)   Talwin [Pentazocine] Nausea Only and Other (See Comments)    Stopped breathing   Zestril [Lisinopril] Other (See Comments)    Dyspnea   Zetia [Ezetimibe] Itching   Advair Diskus [Fluticasone -Salmeterol] Rash    unknown       Outpatient Encounter Medications as of 10/21/2024  Medication Sig   acetaminophen  (TYLENOL ) 500 MG tablet Take 1,000 mg by mouth every 6 (six) hours as needed for moderate pain.   albuterol  (PROAIR  HFA) 108 (90 Base) MCG/ACT inhaler Inhale 1-2 puffs into the lungs every 4 (four) hours as needed. (Patient taking differently: Inhale 1-2 puffs into the lungs every 6 (six) hours as needed for wheezing or shortness of breath.)   budesonide -formoterol  (SYMBICORT ) 80-4.5 MCG/ACT inhaler Inhale 2 puffs into the lungs 2 (two) times daily.   Multiple Vitamins-Minerals (ZINC PO) Take 1 tablet by mouth daily.   nitroGLYCERIN  (NITROSTAT ) 0.4 MG SL tablet Place 1 tablet (0.4 mg total) under the tongue as needed.   pantoprazole  (PROTONIX ) 40 MG tablet Take 1 tablet (40 mg total) by mouth daily for acid reflux   rosuvastatin  (CRESTOR ) 5 MG tablet Take 1 tablet (5 mg total) by mouth daily for cholesterol.   tamsulosin  (FLOMAX ) 0.4 MG CAPS capsule Take 1 capsule (0.4 mg total) by mouth daily, 30 minutes after the same meal each day for urinary symptoms   No facility-administered encounter medications on file as of 10/21/2024.     REVIEW OF SYSTEMS  : All other systems reviewed and negative except where noted in the History of Present Illness.   PHYSICAL EXAM: BP 126/80   Pulse 68   Ht 5' 7.5 (1.715 m)   Wt 243 lb (110.2 kg)   BMI 37.50 kg/m  General: Well developed white male in no acute distress Head: Normocephalic and atraumatic Eyes:  Sclerae anicteric, conjunctiva pink. Ears: Normal auditory acuity Lungs: Clear throughout to auscultation; no W/R/R. Heart: Regular rate and rhythm; no M/R/G. Abdomen: Soft, non-distended.  BS present, somewhat hyperactive.  Non-tender. Musculoskeletal: Symmetrical with no gross deformities  Skin: No lesions on visible extremities Neurological: Alert oriented x 4, grossly non-focal Psychological:  Alert and cooperative. Normal mood and affect Assessment &  Plan Chronic diarrhea following prior E. coli infection Chronic diarrhea persists since E. coli infection in early September, with improvement but not resolution. Stools are runny, sometimes clear, and occur 8-10 times daily, with nocturnal episodes. No associated pain, blood, or nausea. Appetite is normal. Possible post-infectious irritable bowel syndrome considered. - Ordered repeat stool studies to rule out persistent or new infection.  Will repeat fecal calprotectin as well. - Initiated over-the-counter probiotic daily to replenish gut flora, either Florastor or Align. - If stool studies are negative and still no improvement with probiotic then consider colonoscopy to rule out microscopic colitis.   CC:  Auston Opal, DO

## 2024-10-21 NOTE — Patient Instructions (Addendum)
 Your provider has requested that you go to the basement level for lab work before leaving today. Press B on the elevator. The lab is located at the first door on the left as you exit the elevator.  Start daily probiotic such as Florastor or Align.  _______________________________________________________  If your blood pressure at your visit was 140/90 or greater, please contact your primary care physician to follow up on this.  _______________________________________________________  If you are age 61 or older, your body mass index should be between 23-30. Your Body mass index is 37.5 kg/m. If this is out of the aforementioned range listed, please consider follow up with your Primary Care Provider.  If you are age 41 or younger, your body mass index should be between 19-25. Your Body mass index is 37.5 kg/m. If this is out of the aformentioned range listed, please consider follow up with your Primary Care Provider.   ________________________________________________________  The  GI providers would like to encourage you to use MYCHART to communicate with providers for non-urgent requests or questions.  Due to long hold times on the telephone, sending your provider a message by [facility] may be a faster and more efficient way to get a response.  Please allow 48 business hours for a response.  Please remember that this is for non-urgent requests.  _______________________________________________________  [facility] is using a team-based approach to care.  Your team is made up of your doctor and two to three APPS. Our APPS (Nurse Practitioners and Physician Assistants) work with your physician to ensure care continuity for you. They are fully qualified to address your health concerns and develop a treatment plan. They communicate directly with your gastroenterologist to care for you. Seeing the Advanced Practice Practitioners on your physician's team can help you by facilitating  care more promptly, often allowing for earlier appointments, access to diagnostic testing, procedures, and other specialty referrals.

## 2024-10-25 ENCOUNTER — Other Ambulatory Visit

## 2024-10-25 DIAGNOSIS — R197 Diarrhea, unspecified: Secondary | ICD-10-CM

## 2024-10-26 ENCOUNTER — Other Ambulatory Visit: Payer: Self-pay

## 2024-10-26 ENCOUNTER — Other Ambulatory Visit (HOSPITAL_COMMUNITY): Payer: Self-pay

## 2024-10-26 MED ORDER — AMOXICILLIN 875 MG PO TABS
875.0000 mg | ORAL_TABLET | Freq: Two times a day (BID) | ORAL | 0 refills | Status: DC
  Filled 2024-10-26: qty 10, 5d supply, fill #0

## 2024-10-29 LAB — GI PROFILE, STOOL, PCR

## 2024-10-29 LAB — CALPROTECTIN, FECAL: Calprotectin, Fecal: 135 ug/g — AB (ref 0–120)

## 2024-10-31 ENCOUNTER — Other Ambulatory Visit (HOSPITAL_COMMUNITY): Payer: Self-pay

## 2024-10-31 ENCOUNTER — Other Ambulatory Visit: Payer: Self-pay | Admitting: *Deleted

## 2024-10-31 ENCOUNTER — Encounter: Payer: Self-pay | Admitting: *Deleted

## 2024-10-31 ENCOUNTER — Ambulatory Visit: Payer: Self-pay | Admitting: Gastroenterology

## 2024-10-31 DIAGNOSIS — R634 Abnormal weight loss: Secondary | ICD-10-CM

## 2024-10-31 DIAGNOSIS — R197 Diarrhea, unspecified: Secondary | ICD-10-CM

## 2024-10-31 MED ORDER — NA SULFATE-K SULFATE-MG SULF 17.5-3.13-1.6 GM/177ML PO SOLN
1.0000 | Freq: Once | ORAL | 0 refills | Status: DC
  Filled 2024-10-31: qty 354, 1d supply, fill #0

## 2024-11-01 ENCOUNTER — Encounter: Payer: Self-pay | Admitting: Internal Medicine

## 2024-11-08 ENCOUNTER — Ambulatory Visit: Admitting: Internal Medicine

## 2024-11-08 ENCOUNTER — Encounter: Payer: Self-pay | Admitting: Internal Medicine

## 2024-11-08 VITALS — BP 151/81 | HR 65 | Temp 97.6°F | Resp 13 | Ht 67.5 in | Wt 243.0 lb

## 2024-11-08 DIAGNOSIS — D125 Benign neoplasm of sigmoid colon: Secondary | ICD-10-CM

## 2024-11-08 DIAGNOSIS — R197 Diarrhea, unspecified: Secondary | ICD-10-CM

## 2024-11-08 MED ORDER — SODIUM CHLORIDE 0.9 % IV SOLN: 500.0000 mL | Freq: Once | INTRAVENOUS | Status: DC

## 2024-11-08 NOTE — Progress Notes (Deleted)
 Transferred to PACU via stretcher. Patient arousing to stimulation.  VSS upon leaving procedure room.

## 2024-11-08 NOTE — Progress Notes (Signed)
 Conroy Goracke CRNA relieves Database Administrator

## 2024-11-08 NOTE — Progress Notes (Signed)
 Called to room to assist during endoscopic procedure.  Patient ID and intended procedure confirmed with present staff. Received instructions for my participation in the procedure from the performing physician.

## 2024-11-08 NOTE — Op Note (Signed)
 [facility] Patient Name: Shane Clark Procedure Date: 11/08/2024 11:34 AM MRN: [id number] Endoscopist: Lupita FORBES Commander , MD, [id number] Age: 73 Referring MD:  Date of Birth: 1951/05/26 Gender: Male Account #: [id number] Procedure:                Colonoscopy Indications:              Chronic diarrhea, Clinically significant diarrhea                            of unexplained origin Medicines:                Monitored Anesthesia Care Procedure:                Pre-Anesthesia Assessment:                           - Prior to the procedure, a History and Physical                            was performed, and patient medications and                            allergies were reviewed. The patient's tolerance of                            previous anesthesia was also reviewed. The risks                            and benefits of the procedure and the sedation                            options and risks were discussed with the patient.                            All questions were answered, and informed consent                            was obtained. Prior Anticoagulants: The patient has                            taken no anticoagulant or antiplatelet agents. ASA                            Grade Assessment: III - A patient with severe                            systemic disease. After reviewing the risks and                            benefits, the patient was deemed in satisfactory                            condition to undergo the procedure.  After obtaining informed consent, the colonoscope                            was passed under direct vision. Throughout the                            procedure, the patient's blood pressure, pulse, and                            oxygen saturations were monitored continuously. The                            Olympus Scope SN [id number] was introduced through the                            anus and advanced to the the  terminal ileum, with                            identification of the appendiceal orifice and IC                            valve. The colonoscopy was performed without                            difficulty. The patient tolerated the procedure                            well. The quality of the bowel preparation was                            adequate. The terminal ileum, ileocecal valve,                            appendiceal orifice, and rectum were photographed.                            The bowel preparation used was SUPREP via split                            dose instruction. Scope In: 12:34:41 PM Scope Out: 12:53:08 PM Scope Withdrawal Time: 0 hours 12 minutes 42 seconds  Total Procedure Duration: 0 hours 18 minutes 27 seconds  Findings:                 The perianal and digital rectal examinations were                            normal.                           The terminal ileum appeared normal. Biopsies were                            taken with a cold forceps for histology.  Verification of patient identification for the                            specimen was done. Estimated blood loss was minimal.                           A 3 mm polyp was found in the sigmoid colon. The                            polyp was sessile. The polyp was removed with a                            cold snare. Resection and retrieval were complete.                            Verification of patient identification for the                            specimen was done. Estimated blood loss was minimal.                           The exam was otherwise without abnormality on                            direct and retroflexion views.                           Biopsies for histology were taken with a cold                            forceps from the entire colon for evaluation of                            microscopic colitis. Complications:            No immediate  complications. Estimated Blood Loss:     Estimated blood loss was minimal. Impression:               - The examined portion of the ileum was normal.                            Biopsied.                           - One 3 mm polyp in the sigmoid colon, removed with                            a cold snare. Resected and retrieved.                           - The examination was otherwise normal on direct                            and retroflexion views.                           -  Biopsies were taken with a cold forceps from the                            entire colon for evaluation of microscopic colitis. Recommendation:           - Patient has a contact number available for                            emergencies. The signs and symptoms of potential                            delayed complications were discussed with the                            patient. Return to normal activities tomorrow.                            Written discharge instructions were provided to the                            patient.                           - Resume previous diet.                           - Continue present medications.                           - Await pathology results.                           - No recommendation at this time regarding repeat                            colonoscopy due to age. Lupita FORBES Commander, MD 11/08/2024 12:59:14 PM This report has been signed electronically.

## 2024-11-08 NOTE — Progress Notes (Signed)
 History and Physical Interval Note:  11/08/2024 12:32 PM  Shane Clark  has presented today for endoscopic procedure(s), with the diagnosis of  Encounter Diagnosis  Name Primary?   Diarrhea, unspecified type Yes  .  The various methods of evaluation and treatment have been discussed with the patient and/or family. After consideration of risks, benefits and other options for treatment, the patient has consented to  the endoscopic procedure(s).   The patient's history has been reviewed, patient examined, no change in status, stable for endoscopic procedure(s).  I have reviewed the patient's chart and labs.  Questions were answered to the patient's satisfaction.     [name], MD, [facility]

## 2024-11-08 NOTE — Patient Instructions (Addendum)
 The colon lining and the small intestine lining I was able to see looked normal.  I took biopsies to see if there is microscopic inflammation and I will let you know.  There was a tiny polyp seen and removed also.  I appreciate the opportunity to care for you. [name], MD, [facility]  Discharge instructions given. Handout on polyps. Resume previous medications. YOU HAD AN ENDOSCOPIC PROCEDURE TODAY AT THE [facility]:   Refer to the procedure report that was given to you for any specific questions about what was found during the examination.  If the procedure report does not answer your questions, please call your gastroenterologist to clarify.  If you requested that your care partner not be given the details of your procedure findings, then the procedure report has been included in a sealed envelope for you to review at your convenience later.  YOU SHOULD EXPECT: Some feelings of bloating in the abdomen. Passage of more gas than usual.  Walking can help get rid of the air that was put into your GI tract during the procedure and reduce the bloating. If you had a lower endoscopy (such as a colonoscopy or flexible sigmoidoscopy) you may notice spotting of blood in your stool or on the toilet paper. If you underwent a bowel prep for your procedure, you may not have a normal bowel movement for a few days.  Please Note:  You might notice some irritation and congestion in your nose or some drainage.  This is from the oxygen used during your procedure.  There is no need for concern and it should clear up in a day or so.  SYMPTOMS TO REPORT IMMEDIATELY:  Following lower endoscopy (colonoscopy or flexible sigmoidoscopy):  Excessive amounts of blood in the stool  Significant tenderness or worsening of abdominal pains  Swelling of the abdomen that is new, acute  Fever of 100F or higher   For urgent or emergent issues, a gastroenterologist can be reached at any hour by calling [phone]  [phone]. Do not use MyChart messaging for urgent concerns.    DIET:  We do recommend a small meal at first, but then you may proceed to your regular diet.  Drink plenty of fluids but you should avoid alcoholic beverages for 24 hours.  ACTIVITY:  You should plan to take it easy for the rest of today and you should NOT DRIVE or use heavy machinery until tomorrow (because of the sedation medicines used during the test).    FOLLOW UP: Our staff will call the number listed on your records the next business day following your procedure.  We will call around 7:15- 8:00 am to check on you and address any questions or concerns that you may have regarding the information given to you following your procedure. If we do not reach you, we will leave a message.     If any biopsies were taken you will be contacted by phone or by letter within the next 1-3 weeks.  Please call us  at [phone] if you have not heard about the biopsies in 3 weeks.    SIGNATURES/CONFIDENTIALITY: You and/or your care partner have signed paperwork which will be entered into your electronic medical record.  These signatures attest to the fact that that the information above on your After Visit Summary has been reviewed and is understood.  Full responsibility of the confidentiality of this discharge information lies with you and/or your care-partner.

## 2024-11-08 NOTE — Progress Notes (Signed)
 VS by EJ

## 2024-11-08 NOTE — Progress Notes (Signed)
 History and Physical Interval Note:  11/08/2024 12:28 PM  Shane Clark  has presented today for endoscopic procedure(s), with the diagnosis of  Encounter Diagnosis  Name Primary?   Diarrhea, unspecified type Yes  .  The various methods of evaluation and treatment have been discussed with the patient and/or family. After consideration of risks, benefits and other options for treatment, the patient has consented to  the endoscopic procedure(s).   The patient's history has been reviewed, patient examined, no change in status, stable for endoscopic procedure(s).  I have reviewed the patient's chart and labs.  Questions were answered to the patient's satisfaction.     [name], MD, [facility]

## 2024-11-08 NOTE — Progress Notes (Signed)
 Sedate, gd SR, tolerated procedure well, VSS, report to RN

## 2024-11-09 ENCOUNTER — Telehealth: Payer: Self-pay

## 2024-11-09 NOTE — Telephone Encounter (Signed)
°  Follow up Call-     11/08/2024   11:48 AM 06/11/2023    9:58 AM  Call back number  Post procedure Call Back phone  # [phone]  Permission to leave phone message Yes Yes     Patient questions:  Do you have a fever, pain , or abdominal swelling? No. Pain Score  0 *  Have you tolerated food without any problems? Yes.    Have you been able to return to your normal activities? Yes.    Do you have any questions about your discharge instructions: Diet   No. Medications  No. Follow up visit  No.  Do you have questions or concerns about your Care? No.  Actions: * If pain score is 4 or above: No action needed, pain <4.

## 2024-11-11 LAB — SURGICAL PATHOLOGY

## 2024-11-18 ENCOUNTER — Ambulatory Visit: Payer: Self-pay | Admitting: Internal Medicine

## 2024-11-18 ENCOUNTER — Other Ambulatory Visit (HOSPITAL_COMMUNITY): Payer: Self-pay

## 2024-11-18 ENCOUNTER — Telehealth (HOSPITAL_COMMUNITY): Payer: Self-pay

## 2024-11-18 ENCOUNTER — Telehealth: Payer: Self-pay

## 2024-11-18 MED ORDER — DIPHENOXYLATE-ATROPINE 2.5-0.025 MG PO TABS
1.0000 | ORAL_TABLET | Freq: Four times a day (QID) | ORAL | 0 refills | Status: DC
  Filled 2024-11-18: qty 30, 4d supply, fill #0

## 2024-11-18 MED ORDER — RIFAXIMIN 550 MG PO TABS
550.0000 mg | ORAL_TABLET | Freq: Three times a day (TID) | ORAL | 0 refills | Status: DC
  Filled 2024-11-18 (×3): qty 42, 14d supply, fill #0

## 2024-11-18 NOTE — Telephone Encounter (Signed)
 Colonoscopy biopsies NL 3 mm tubular adenoma - reviewed with patient  He will try Xifaxan  (? Post-infectious IBS) and Lomotil   He still has 5+ watery stools daily and leaks mucous at night unless sitting in chair  Meds ordered this encounter  Medications   rifaximin  (XIFAXAN ) 550 MG TABS tablet    Sig: Take 1 tablet (550 mg total) by mouth 3 (three) times daily.    Dispense:  42 tablet    Refill:  0    Persistent diarrhea after multiple treatments for E coli infection, normal colonoscopy and biopsies   diphenoxylate -atropine  (LOMOTIL ) 2.5-0.025 MG tablet    Sig: 1-2 tablets 4 times a day for diarrhea    Dispense:  30 tablet    Refill:  0   If this fails need to consider empiric parasite treatment as original sxs began after camping  He is to update me after treatment

## 2024-11-18 NOTE — Telephone Encounter (Signed)
 Noted pt. aware

## 2024-11-18 NOTE — Telephone Encounter (Signed)
 Inbound call from patient stating that he was approved for Xifaxan. He states the copay is still very high and is requesting an alternative medication. Please advise.

## 2024-11-18 NOTE — Telephone Encounter (Signed)
 Pharmacy Patient Advocate Encounter   Received notification from Pt Calls Messages that prior authorization for Xifaxan 550MG  tablets is required/requested.   Insurance verification completed.   The patient is insured through [facility].   Per test claim: PA required; PA submitted to above mentioned insurance via Latent Key/confirmation #/EOC [facility] Status is pending

## 2024-11-18 NOTE — Telephone Encounter (Signed)
 Pharmacy Patient Advocate Encounter  Received notification from [facility] [employer] that Prior Authorization for Xifaxan 550MG  tablets has been APPROVED from 11-18-2024 to 12-02-2024. Ran test claim, Copay is $729.40. This test claim was processed through [facility]- copay amounts may vary at other pharmacies due to pharmacy/plan contracts, or as the patient moves through the different stages of their insurance plan.   PA #/Case ID/Reference #: [id number]

## 2024-11-21 ENCOUNTER — Other Ambulatory Visit (HOSPITAL_COMMUNITY): Payer: Self-pay

## 2024-11-21 MED ORDER — METRONIDAZOLE 500 MG PO TABS
500.0000 mg | ORAL_TABLET | Freq: Two times a day (BID) | ORAL | 0 refills | Status: DC
  Filled 2024-11-21: qty 20, 10d supply, fill #0

## 2024-11-21 NOTE — Telephone Encounter (Addendum)
 I sent in a prescription for metronidazole  instead.  Have him update me how he is doing in the first part of January, after he finishes that.  I spoke to [place] (she passed through today) and we will ask that she put him on the sample list but it seems unlikely we will be able to get samples anymore.

## 2024-11-21 NOTE — Telephone Encounter (Signed)
Dr Gessner please advise? 

## 2024-11-21 NOTE — Addendum Note (Signed)
 Addended by: AVRAM LUPITA BRAVO on: 11/21/2024 01:02 PM   Modules accepted: Orders

## 2024-11-21 NOTE — Telephone Encounter (Signed)
 The pt has been advised and will call back the first of Jan with an update

## 2024-12-13 ENCOUNTER — Other Ambulatory Visit (HOSPITAL_COMMUNITY): Payer: Self-pay

## 2024-12-13 MED ORDER — DIPHENOXYLATE-ATROPINE 2.5-0.025 MG PO TABS
1.0000 | ORAL_TABLET | Freq: Four times a day (QID) | ORAL | 0 refills | Status: AC
  Filled 2024-12-13: qty 90, 12d supply, fill #0

## 2024-12-13 NOTE — Telephone Encounter (Signed)
 Spoke with patient.  He is having 5 or 6 soft squiggly stools a day.  Sometimes watery with mucus.  It is better than initially.  He is only taking 1 generic Lomotil  daily.  He tried cholestyramine  with onset back in the summer but there was no benefit then though that was prior to being diagnosed and treated for E. coli.  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  I have asked him to retry cholestyramine  with lunch.  I have refilled Lomotil .  He will communicate back with me through MyChart in a week or 2.  Meds ordered this encounter  Medications   DISCONTD: rifaximin  (XIFAXAN ) 550 MG TABS tablet    Sig: Take 1 tablet (550 mg total) by mouth 3 (three) times daily.    Dispense:  42 tablet    Refill:  0    Persistent diarrhea after multiple treatments for E coli infection, normal colonoscopy and biopsies   DISCONTD: diphenoxylate -atropine  (LOMOTIL ) 2.5-0.025 MG tablet    Sig: Take 1-2 tablets by mouth 4 (four) times daily as needed for diarrhea.    Dispense:  30 tablet    Refill:  0   diphenoxylate -atropine  (LOMOTIL ) 2.5-0.025 MG tablet    Sig: Take 1-2 tablets by mouth 4 (four) times daily as needed for diarrhea.    Dispense:  90 tablet    Refill:  0

## 2024-12-23 ENCOUNTER — Other Ambulatory Visit (HOSPITAL_COMMUNITY): Payer: Self-pay
